# Patient Record
Sex: Female | Born: 1935 | Race: White | Hispanic: No | Smoking: Never smoker
Health system: Southern US, Community
[De-identification: ages and names within clinical notes are randomized; demographics above are authoritative.]

## PROBLEM LIST (undated history)

## (undated) DIAGNOSIS — E1149 Type 2 diabetes mellitus with other diabetic neurological complication: Secondary | ICD-10-CM

## (undated) DIAGNOSIS — K5730 Diverticulosis of large intestine without perforation or abscess without bleeding: Secondary | ICD-10-CM

## (undated) DIAGNOSIS — I4891 Unspecified atrial fibrillation: Secondary | ICD-10-CM

## (undated) DIAGNOSIS — I509 Heart failure, unspecified: Secondary | ICD-10-CM

## (undated) DIAGNOSIS — I10 Essential (primary) hypertension: Secondary | ICD-10-CM

## (undated) DIAGNOSIS — E119 Type 2 diabetes mellitus without complications: Secondary | ICD-10-CM

## (undated) DIAGNOSIS — G250 Essential tremor: Secondary | ICD-10-CM

## (undated) DIAGNOSIS — E785 Hyperlipidemia, unspecified: Secondary | ICD-10-CM

## (undated) DIAGNOSIS — E039 Hypothyroidism, unspecified: Secondary | ICD-10-CM

## (undated) DIAGNOSIS — F419 Anxiety disorder, unspecified: Secondary | ICD-10-CM

## (undated) HISTORY — DX: Diverticulosis of large intestine without perforation or abscess without bleeding: K57.30

## (undated) HISTORY — DX: Essential tremor: G25.0

## (undated) HISTORY — PX: PACEMAKER PLACEMENT: SHX43

## (undated) HISTORY — DX: Essential (primary) hypertension: I10

## (undated) HISTORY — DX: Type 2 diabetes mellitus without complications: E11.9

## (undated) HISTORY — DX: Heart failure, unspecified: I50.9

## (undated) HISTORY — DX: Unspecified atrial fibrillation: I48.91

## (undated) HISTORY — DX: Hyperlipidemia, unspecified: E78.5

## (undated) HISTORY — DX: Anxiety disorder, unspecified: F41.9

## (undated) HISTORY — PX: OTHER SURGICAL HISTORY: SHX169

## (undated) HISTORY — PX: TRANSTHORACIC ECHOCARDIOGRAM: SHX275

## (undated) HISTORY — DX: Hypothyroidism, unspecified: E03.9

## (undated) HISTORY — DX: Type 2 diabetes mellitus with other diabetic neurological complication: E11.49

---

## 1998-04-10 ENCOUNTER — Encounter: Payer: Self-pay | Admitting: Emergency Medicine

## 1998-04-10 ENCOUNTER — Emergency Department (HOSPITAL_COMMUNITY): Admission: EM | Admit: 1998-04-10 | Discharge: 1998-04-10 | Payer: Self-pay | Admitting: Emergency Medicine

## 1998-04-15 ENCOUNTER — Encounter: Payer: Self-pay | Admitting: Emergency Medicine

## 1998-04-15 ENCOUNTER — Emergency Department (HOSPITAL_COMMUNITY): Admission: EM | Admit: 1998-04-15 | Discharge: 1998-04-15 | Payer: Self-pay | Admitting: Emergency Medicine

## 1998-04-28 ENCOUNTER — Encounter: Admission: RE | Admit: 1998-04-28 | Discharge: 1998-07-27 | Payer: Self-pay | Admitting: Family Medicine

## 2000-05-22 HISTORY — PX: CATARACT EXTRACTION: SUR2

## 2000-08-03 ENCOUNTER — Emergency Department (HOSPITAL_COMMUNITY): Admission: EM | Admit: 2000-08-03 | Discharge: 2000-08-03 | Payer: Self-pay | Admitting: Emergency Medicine

## 2000-08-03 ENCOUNTER — Encounter: Payer: Self-pay | Admitting: Emergency Medicine

## 2000-12-25 ENCOUNTER — Encounter: Admission: RE | Admit: 2000-12-25 | Discharge: 2001-03-25 | Payer: Self-pay | Admitting: Internal Medicine

## 2002-01-02 ENCOUNTER — Encounter: Payer: Self-pay | Admitting: Emergency Medicine

## 2002-01-02 ENCOUNTER — Inpatient Hospital Stay (HOSPITAL_COMMUNITY): Admission: EM | Admit: 2002-01-02 | Discharge: 2002-01-05 | Payer: Self-pay | Admitting: Emergency Medicine

## 2002-03-16 ENCOUNTER — Inpatient Hospital Stay (HOSPITAL_COMMUNITY): Admission: EM | Admit: 2002-03-16 | Discharge: 2002-03-19 | Payer: Self-pay | Admitting: Emergency Medicine

## 2002-04-20 ENCOUNTER — Encounter: Payer: Self-pay | Admitting: Emergency Medicine

## 2002-04-20 ENCOUNTER — Inpatient Hospital Stay (HOSPITAL_COMMUNITY): Admission: EM | Admit: 2002-04-20 | Discharge: 2002-04-30 | Payer: Self-pay | Admitting: Emergency Medicine

## 2002-04-26 ENCOUNTER — Encounter: Payer: Self-pay | Admitting: *Deleted

## 2002-04-29 ENCOUNTER — Encounter: Payer: Self-pay | Admitting: *Deleted

## 2003-03-02 ENCOUNTER — Emergency Department (HOSPITAL_COMMUNITY): Admission: EM | Admit: 2003-03-02 | Discharge: 2003-03-02 | Payer: Self-pay | Admitting: Emergency Medicine

## 2003-03-02 ENCOUNTER — Encounter: Payer: Self-pay | Admitting: Emergency Medicine

## 2003-11-12 ENCOUNTER — Emergency Department (HOSPITAL_COMMUNITY): Admission: EM | Admit: 2003-11-12 | Discharge: 2003-11-12 | Payer: Self-pay | Admitting: *Deleted

## 2004-03-10 ENCOUNTER — Observation Stay (HOSPITAL_COMMUNITY): Admission: EM | Admit: 2004-03-10 | Discharge: 2004-03-11 | Payer: Self-pay | Admitting: Emergency Medicine

## 2004-03-30 ENCOUNTER — Ambulatory Visit: Payer: Self-pay | Admitting: Internal Medicine

## 2004-04-05 ENCOUNTER — Encounter: Admission: RE | Admit: 2004-04-05 | Discharge: 2004-04-05 | Payer: Self-pay | Admitting: Internal Medicine

## 2004-04-05 ENCOUNTER — Ambulatory Visit: Payer: Self-pay | Admitting: Internal Medicine

## 2004-04-11 ENCOUNTER — Ambulatory Visit: Payer: Self-pay | Admitting: Internal Medicine

## 2004-04-28 ENCOUNTER — Inpatient Hospital Stay (HOSPITAL_COMMUNITY): Admission: AD | Admit: 2004-04-28 | Discharge: 2004-05-03 | Payer: Self-pay | Admitting: Cardiology

## 2004-05-10 ENCOUNTER — Emergency Department (HOSPITAL_COMMUNITY): Admission: EM | Admit: 2004-05-10 | Discharge: 2004-05-10 | Payer: Self-pay | Admitting: Emergency Medicine

## 2004-06-07 ENCOUNTER — Ambulatory Visit: Payer: Self-pay | Admitting: Internal Medicine

## 2004-07-06 ENCOUNTER — Ambulatory Visit: Payer: Self-pay | Admitting: Internal Medicine

## 2004-07-18 ENCOUNTER — Ambulatory Visit: Payer: Self-pay | Admitting: Internal Medicine

## 2004-07-21 ENCOUNTER — Ambulatory Visit: Payer: Self-pay | Admitting: Internal Medicine

## 2004-07-26 ENCOUNTER — Ambulatory Visit: Payer: Self-pay | Admitting: Internal Medicine

## 2004-07-26 ENCOUNTER — Inpatient Hospital Stay (HOSPITAL_COMMUNITY): Admission: AD | Admit: 2004-07-26 | Discharge: 2004-07-30 | Payer: Self-pay | Admitting: Internal Medicine

## 2004-08-02 ENCOUNTER — Ambulatory Visit: Payer: Self-pay | Admitting: Internal Medicine

## 2004-08-09 ENCOUNTER — Ambulatory Visit: Payer: Self-pay | Admitting: Internal Medicine

## 2004-08-10 ENCOUNTER — Ambulatory Visit: Payer: Self-pay | Admitting: Internal Medicine

## 2004-08-16 ENCOUNTER — Ambulatory Visit: Payer: Self-pay | Admitting: Cardiology

## 2004-08-16 ENCOUNTER — Ambulatory Visit: Payer: Self-pay | Admitting: Internal Medicine

## 2004-08-26 ENCOUNTER — Ambulatory Visit: Payer: Self-pay | Admitting: Internal Medicine

## 2004-09-09 ENCOUNTER — Ambulatory Visit: Payer: Self-pay | Admitting: Cardiovascular Disease

## 2004-09-21 ENCOUNTER — Ambulatory Visit: Payer: Self-pay | Admitting: Internal Medicine

## 2004-09-23 ENCOUNTER — Ambulatory Visit: Payer: Self-pay | Admitting: Cardiovascular Disease

## 2004-10-14 ENCOUNTER — Ambulatory Visit: Payer: Self-pay | Admitting: Cardiology

## 2004-10-19 ENCOUNTER — Ambulatory Visit: Payer: Self-pay | Admitting: Internal Medicine

## 2004-10-24 ENCOUNTER — Ambulatory Visit: Payer: Self-pay | Admitting: Cardiology

## 2004-10-26 ENCOUNTER — Ambulatory Visit: Payer: Self-pay | Admitting: Internal Medicine

## 2004-11-14 ENCOUNTER — Ambulatory Visit: Payer: Self-pay | Admitting: Internal Medicine

## 2004-11-23 ENCOUNTER — Ambulatory Visit: Payer: Self-pay | Admitting: Family Medicine

## 2004-11-28 ENCOUNTER — Ambulatory Visit: Payer: Self-pay | Admitting: Cardiology

## 2004-12-09 ENCOUNTER — Ambulatory Visit: Payer: Self-pay | Admitting: Internal Medicine

## 2004-12-12 ENCOUNTER — Ambulatory Visit: Payer: Self-pay | Admitting: Cardiology

## 2004-12-23 ENCOUNTER — Ambulatory Visit: Payer: Self-pay | Admitting: Internal Medicine

## 2004-12-26 ENCOUNTER — Ambulatory Visit: Payer: Self-pay | Admitting: Cardiology

## 2004-12-29 ENCOUNTER — Ambulatory Visit: Payer: Self-pay | Admitting: Internal Medicine

## 2005-01-03 ENCOUNTER — Ambulatory Visit: Payer: Self-pay | Admitting: Internal Medicine

## 2005-01-11 ENCOUNTER — Ambulatory Visit: Payer: Self-pay | Admitting: Internal Medicine

## 2005-01-13 ENCOUNTER — Ambulatory Visit: Payer: Self-pay | Admitting: Internal Medicine

## 2005-01-16 ENCOUNTER — Ambulatory Visit: Payer: Self-pay | Admitting: Internal Medicine

## 2005-01-31 ENCOUNTER — Ambulatory Visit: Payer: Self-pay | Admitting: Internal Medicine

## 2005-02-15 ENCOUNTER — Ambulatory Visit: Payer: Self-pay | Admitting: Internal Medicine

## 2005-02-15 ENCOUNTER — Ambulatory Visit: Payer: Self-pay | Admitting: Cardiology

## 2005-02-27 ENCOUNTER — Ambulatory Visit: Payer: Self-pay | Admitting: Internal Medicine

## 2005-03-14 ENCOUNTER — Ambulatory Visit: Payer: Self-pay | Admitting: Internal Medicine

## 2005-03-21 ENCOUNTER — Encounter: Admission: RE | Admit: 2005-03-21 | Discharge: 2005-03-21 | Payer: Self-pay | Admitting: Internal Medicine

## 2005-03-21 ENCOUNTER — Ambulatory Visit: Payer: Self-pay | Admitting: Internal Medicine

## 2005-04-07 ENCOUNTER — Ambulatory Visit: Payer: Self-pay | Admitting: Internal Medicine

## 2005-04-11 ENCOUNTER — Ambulatory Visit: Payer: Self-pay | Admitting: Cardiology

## 2005-04-25 ENCOUNTER — Ambulatory Visit: Payer: Self-pay | Admitting: Cardiology

## 2005-05-04 ENCOUNTER — Ambulatory Visit: Payer: Self-pay | Admitting: Internal Medicine

## 2005-05-09 ENCOUNTER — Ambulatory Visit: Payer: Self-pay | Admitting: Cardiovascular Disease

## 2005-05-30 ENCOUNTER — Ambulatory Visit: Payer: Self-pay | Admitting: Cardiology

## 2005-06-09 ENCOUNTER — Ambulatory Visit: Payer: Self-pay | Admitting: Cardiology

## 2005-06-27 ENCOUNTER — Ambulatory Visit: Payer: Self-pay | Admitting: Cardiology

## 2005-07-04 ENCOUNTER — Ambulatory Visit: Payer: Self-pay | Admitting: Internal Medicine

## 2005-07-18 ENCOUNTER — Ambulatory Visit: Payer: Self-pay | Admitting: *Deleted

## 2005-08-01 ENCOUNTER — Ambulatory Visit: Payer: Self-pay | Admitting: *Deleted

## 2005-08-07 ENCOUNTER — Ambulatory Visit: Payer: Self-pay | Admitting: Internal Medicine

## 2005-08-16 ENCOUNTER — Ambulatory Visit: Payer: Self-pay | Admitting: *Deleted

## 2005-08-18 ENCOUNTER — Ambulatory Visit: Payer: Self-pay | Admitting: Internal Medicine

## 2005-09-13 ENCOUNTER — Ambulatory Visit: Payer: Self-pay | Admitting: *Deleted

## 2005-09-27 ENCOUNTER — Ambulatory Visit: Payer: Self-pay | Admitting: Cardiology

## 2005-10-11 ENCOUNTER — Ambulatory Visit: Payer: Self-pay | Admitting: Cardiology

## 2005-10-17 ENCOUNTER — Ambulatory Visit: Payer: Self-pay | Admitting: Internal Medicine

## 2005-10-20 ENCOUNTER — Emergency Department (HOSPITAL_COMMUNITY): Admission: EM | Admit: 2005-10-20 | Discharge: 2005-10-20 | Payer: Self-pay | Admitting: Emergency Medicine

## 2005-11-01 ENCOUNTER — Ambulatory Visit: Payer: Self-pay | Admitting: Cardiovascular Disease

## 2005-11-10 ENCOUNTER — Ambulatory Visit: Payer: Self-pay | Admitting: Cardiovascular Disease

## 2005-11-10 ENCOUNTER — Ambulatory Visit: Payer: Self-pay | Admitting: Internal Medicine

## 2005-11-17 ENCOUNTER — Ambulatory Visit: Payer: Self-pay | Admitting: Internal Medicine

## 2005-12-18 ENCOUNTER — Ambulatory Visit: Payer: Self-pay | Admitting: Family Medicine

## 2006-01-19 ENCOUNTER — Ambulatory Visit: Payer: Self-pay | Admitting: Internal Medicine

## 2006-02-07 ENCOUNTER — Ambulatory Visit: Payer: Self-pay | Admitting: Internal Medicine

## 2006-02-14 ENCOUNTER — Ambulatory Visit: Payer: Self-pay | Admitting: Internal Medicine

## 2006-03-13 ENCOUNTER — Ambulatory Visit: Payer: Self-pay | Admitting: Gastroenterology

## 2006-03-21 ENCOUNTER — Ambulatory Visit: Payer: Self-pay | Admitting: Gastroenterology

## 2006-03-21 LAB — HM COLONOSCOPY

## 2006-04-04 ENCOUNTER — Ambulatory Visit: Payer: Self-pay | Admitting: Internal Medicine

## 2006-04-04 LAB — CONVERTED CEMR LAB
ALT: 22 units/L (ref 0–40)
AST: 20 units/L (ref 0–37)
Albumin: 4 g/dL (ref 3.5–5.2)
Alkaline Phosphatase: 69 units/L (ref 39–117)
BUN: 23 mg/dL (ref 6–23)
Basophils Absolute: 0 10*3/uL (ref 0.0–0.1)
Basophils Relative: 0.6 % (ref 0.0–1.0)
Bilirubin Urine: NEGATIVE
CO2: 27 meq/L (ref 19–32)
Calcium: 9.8 mg/dL (ref 8.4–10.5)
Chloride: 97 meq/L (ref 96–112)
Chol/HDL Ratio, serum: 6.9
Cholesterol: 165 mg/dL (ref 0–200)
Creatinine, Ser: 1.2 mg/dL (ref 0.4–1.2)
Creatinine,U: 87.6 mg/dL
Crystals: NEGATIVE
Eosinophil percent: 1.3 % (ref 0.0–5.0)
GFR calc non Af Amer: 46 mL/min
Glomerular Filtration Rate, Af Am: 56 mL/min/{1.73_m2}
Glucose, Bld: 203 mg/dL — ABNORMAL HIGH (ref 70–99)
HCT: 38 % (ref 36.0–46.0)
HDL: 23.9 mg/dL — ABNORMAL LOW (ref >39.0)
Hemoglobin, Urine: NEGATIVE
Hemoglobin: 12.8 g/dL (ref 12.0–15.0)
Hgb A1c MFr Bld: 7.3 % — ABNORMAL HIGH (ref 4.6–6.0)
INR: 3 — ABNORMAL HIGH (ref 0.9–2.0)
Ketones, ur: NEGATIVE mg/dL
LDL DIRECT: 89.4 mg/dL
Lymphocytes Relative: 29.7 % (ref 12.0–46.0)
MCHC: 33.6 g/dL (ref 30.0–36.0)
MCV: 95.1 fL (ref 78.0–100.0)
Microalb Creat Ratio: 10.3 mg/g (ref 0.0–30.0)
Microalb, Ur: 0.9 mg/dL (ref 0.0–1.9)
Monocytes Absolute: 0.5 10*3/uL (ref 0.2–0.7)
Monocytes Relative: 7.4 % (ref 3.0–11.0)
Mucus, UA: NEGATIVE
Neutro Abs: 4 10*3/uL (ref 1.4–7.7)
Neutrophils Relative %: 61 % (ref 43.0–77.0)
Nitrite: NEGATIVE
Platelets: 143 10*3/uL — ABNORMAL LOW (ref 150–400)
Potassium: 4.2 meq/L (ref 3.5–5.1)
Prothrombin Time: 22.1 s — ABNORMAL HIGH (ref 10.0–14.0)
RBC / HPF: NONE SEEN
RBC: 3.99 M/uL (ref 3.87–5.11)
RDW: 12.7 % (ref 11.5–14.6)
Sodium: 131 meq/L — ABNORMAL LOW (ref 135–145)
Specific Gravity, Urine: 1.015 (ref 1.000–1.03)
TSH: 0.45 microintl units/mL (ref 0.35–5.50)
Total Bilirubin: 0.7 mg/dL (ref 0.3–1.2)
Total Protein, Urine: NEGATIVE mg/dL
Total Protein: 7.3 g/dL (ref 6.0–8.3)
Triglyceride fasting, serum: 306 mg/dL (ref 0–149)
Urine Glucose: NEGATIVE mg/dL
Urobilinogen, UA: 0.2 (ref 0.0–1.0)
VLDL: 61 mg/dL — ABNORMAL HIGH (ref 0–40)
WBC: 6.6 10*3/uL (ref 4.5–10.5)
pH: 5.5 (ref 5.0–8.0)

## 2006-04-16 ENCOUNTER — Ambulatory Visit: Payer: Self-pay | Admitting: Cardiology

## 2006-04-20 ENCOUNTER — Ambulatory Visit: Payer: Self-pay | Admitting: Gastroenterology

## 2006-04-23 ENCOUNTER — Ambulatory Visit: Payer: Self-pay | Admitting: Internal Medicine

## 2006-05-03 ENCOUNTER — Ambulatory Visit: Payer: Self-pay | Admitting: Cardiology

## 2006-05-30 ENCOUNTER — Ambulatory Visit: Payer: Self-pay | Admitting: Cardiology

## 2006-06-19 ENCOUNTER — Ambulatory Visit (HOSPITAL_COMMUNITY): Admission: RE | Admit: 2006-06-19 | Discharge: 2006-06-19 | Payer: Self-pay | Admitting: Internal Medicine

## 2006-06-19 ENCOUNTER — Ambulatory Visit: Payer: Self-pay | Admitting: Internal Medicine

## 2006-06-19 LAB — CONVERTED CEMR LAB: Prothrombin Time: 19.3 s — ABNORMAL HIGH (ref 10.0–14.0)

## 2006-06-22 ENCOUNTER — Ambulatory Visit: Payer: Self-pay | Admitting: Internal Medicine

## 2006-06-27 ENCOUNTER — Ambulatory Visit: Payer: Self-pay | Admitting: Cardiology

## 2006-08-01 ENCOUNTER — Ambulatory Visit: Payer: Self-pay | Admitting: *Deleted

## 2006-08-01 ENCOUNTER — Ambulatory Visit: Payer: Self-pay | Admitting: Internal Medicine

## 2006-08-22 ENCOUNTER — Ambulatory Visit: Payer: Self-pay | Admitting: Cardiovascular Disease

## 2006-09-19 ENCOUNTER — Ambulatory Visit: Payer: Self-pay | Admitting: Internal Medicine

## 2006-10-07 ENCOUNTER — Ambulatory Visit: Payer: Self-pay | Admitting: Internal Medicine

## 2006-10-07 ENCOUNTER — Inpatient Hospital Stay (HOSPITAL_COMMUNITY): Admission: EM | Admit: 2006-10-07 | Discharge: 2006-10-11 | Payer: Self-pay | Admitting: Emergency Medicine

## 2006-10-14 ENCOUNTER — Emergency Department (HOSPITAL_COMMUNITY): Admission: EM | Admit: 2006-10-14 | Discharge: 2006-10-14 | Payer: Self-pay | Admitting: Emergency Medicine

## 2006-10-16 ENCOUNTER — Ambulatory Visit: Payer: Self-pay | Admitting: Internal Medicine

## 2006-10-16 LAB — CONVERTED CEMR LAB
ALT: 21 units/L (ref 0–40)
AST: 18 units/L (ref 0–37)
Albumin: 3.8 g/dL (ref 3.5–5.2)
Alkaline Phosphatase: 69 units/L (ref 39–117)
BUN: 19 mg/dL (ref 6–23)
Bilirubin, Direct: 0.1 mg/dL (ref 0.0–0.3)
CO2: 27 meq/L (ref 19–32)
Calcium: 9.3 mg/dL (ref 8.4–10.5)
Chloride: 103 meq/L (ref 96–112)
Cholesterol: 173 mg/dL (ref 0–200)
Creatinine, Ser: 1.1 mg/dL (ref 0.4–1.2)
Direct LDL: 77 mg/dL
GFR calc Af Amer: 62 mL/min
GFR calc non Af Amer: 51 mL/min
Glucose, Bld: 85 mg/dL (ref 70–99)
HDL: 20.1 mg/dL — ABNORMAL LOW (ref >39.0)
Hgb A1c MFr Bld: 7.7 % — ABNORMAL HIGH (ref 4.6–6.0)
Potassium: 3.9 meq/L (ref 3.5–5.1)
Sodium: 134 meq/L — ABNORMAL LOW (ref 135–145)
Total Bilirubin: 0.7 mg/dL (ref 0.3–1.2)
Total CHOL/HDL Ratio: 8.6
Total Protein: 7.1 g/dL (ref 6.0–8.3)
Triglycerides: 431 mg/dL (ref 0–149)
VLDL: 86 mg/dL — ABNORMAL HIGH (ref 0–40)

## 2006-10-23 ENCOUNTER — Ambulatory Visit: Payer: Self-pay | Admitting: Internal Medicine

## 2006-11-16 ENCOUNTER — Ambulatory Visit: Payer: Self-pay | Admitting: Internal Medicine

## 2006-12-28 ENCOUNTER — Telehealth: Payer: Self-pay | Admitting: Internal Medicine

## 2007-01-02 DIAGNOSIS — I4891 Unspecified atrial fibrillation: Secondary | ICD-10-CM

## 2007-01-02 DIAGNOSIS — K5730 Diverticulosis of large intestine without perforation or abscess without bleeding: Secondary | ICD-10-CM | POA: Insufficient documentation

## 2007-01-02 DIAGNOSIS — E1140 Type 2 diabetes mellitus with diabetic neuropathy, unspecified: Secondary | ICD-10-CM

## 2007-01-02 DIAGNOSIS — I10 Essential (primary) hypertension: Secondary | ICD-10-CM

## 2007-01-02 DIAGNOSIS — E039 Hypothyroidism, unspecified: Secondary | ICD-10-CM

## 2007-01-02 DIAGNOSIS — F411 Generalized anxiety disorder: Secondary | ICD-10-CM

## 2007-01-24 ENCOUNTER — Telehealth: Payer: Self-pay | Admitting: Internal Medicine

## 2007-01-26 ENCOUNTER — Encounter: Payer: Self-pay | Admitting: Internal Medicine

## 2007-01-26 DIAGNOSIS — I5042 Chronic combined systolic (congestive) and diastolic (congestive) heart failure: Secondary | ICD-10-CM | POA: Insufficient documentation

## 2007-02-06 ENCOUNTER — Telehealth: Payer: Self-pay | Admitting: Internal Medicine

## 2007-02-11 ENCOUNTER — Ambulatory Visit: Payer: Self-pay

## 2007-02-13 ENCOUNTER — Ambulatory Visit: Payer: Self-pay | Admitting: Internal Medicine

## 2007-02-18 ENCOUNTER — Ambulatory Visit: Payer: Self-pay | Admitting: Internal Medicine

## 2007-02-18 LAB — CONVERTED CEMR LAB
ALT: 16 units/L (ref 0–35)
AST: 17 units/L (ref 0–37)
Albumin: 3.9 g/dL (ref 3.5–5.2)
Alkaline Phosphatase: 73 units/L (ref 39–117)
BUN: 20 mg/dL (ref 6–23)
Bilirubin, Direct: 0.1 mg/dL (ref 0.0–0.3)
CO2: 30 meq/L (ref 19–32)
Calcium: 9.2 mg/dL (ref 8.4–10.5)
Chloride: 102 meq/L (ref 96–112)
Cholesterol: 191 mg/dL (ref 0–200)
Creatinine, Ser: 1.1 mg/dL (ref 0.4–1.2)
Direct LDL: 127.8 mg/dL
GFR calc Af Amer: 62 mL/min
GFR calc non Af Amer: 51 mL/min
Glucose, Bld: 129 mg/dL — ABNORMAL HIGH (ref 70–99)
HDL: 23.1 mg/dL — ABNORMAL LOW (ref >39.0)
Potassium: 4.3 meq/L (ref 3.5–5.1)
Sodium: 137 meq/L (ref 135–145)
Total Bilirubin: 0.9 mg/dL (ref 0.3–1.2)
Total CHOL/HDL Ratio: 8.3
Total Protein: 6.5 g/dL (ref 6.0–8.3)
Triglycerides: 224 mg/dL (ref 0–149)
VLDL: 45 mg/dL — ABNORMAL HIGH (ref 0–40)

## 2007-02-26 ENCOUNTER — Ambulatory Visit: Payer: Self-pay | Admitting: Internal Medicine

## 2007-02-26 DIAGNOSIS — G252 Other specified forms of tremor: Secondary | ICD-10-CM | POA: Insufficient documentation

## 2007-02-26 DIAGNOSIS — G250 Essential tremor: Secondary | ICD-10-CM | POA: Insufficient documentation

## 2007-03-12 ENCOUNTER — Ambulatory Visit: Payer: Self-pay | Admitting: Internal Medicine

## 2007-04-24 ENCOUNTER — Telehealth: Payer: Self-pay | Admitting: Internal Medicine

## 2007-04-25 ENCOUNTER — Ambulatory Visit: Payer: Self-pay | Admitting: Internal Medicine

## 2007-04-25 LAB — CONVERTED CEMR LAB
ALT: 21 units/L (ref 0–35)
AST: 19 units/L (ref 0–37)
Albumin: 4.1 g/dL (ref 3.5–5.2)
Alkaline Phosphatase: 85 units/L (ref 39–117)
BUN: 20 mg/dL (ref 6–23)
Bilirubin, Direct: 0.1 mg/dL (ref 0.0–0.3)
CO2: 30 meq/L (ref 19–32)
Calcium: 9.3 mg/dL (ref 8.4–10.5)
Chloride: 102 meq/L (ref 96–112)
Cholesterol: 205 mg/dL (ref 0–200)
Creatinine, Ser: 1 mg/dL (ref 0.4–1.2)
Direct LDL: 140.6 mg/dL
GFR calc Af Amer: 69 mL/min
GFR calc non Af Amer: 57 mL/min
Glucose, Bld: 130 mg/dL — ABNORMAL HIGH (ref 70–99)
HDL: 28.5 mg/dL — ABNORMAL LOW (ref >39.0)
Hgb A1c MFr Bld: 6.3 % — ABNORMAL HIGH (ref 4.6–6.0)
Potassium: 4.3 meq/L (ref 3.5–5.1)
Sodium: 138 meq/L (ref 135–145)
Total Bilirubin: 0.6 mg/dL (ref 0.3–1.2)
Total CHOL/HDL Ratio: 7.2
Total Protein: 7.1 g/dL (ref 6.0–8.3)
Triglycerides: 176 mg/dL — ABNORMAL HIGH (ref 0–149)
VLDL: 35 mg/dL (ref 0–40)

## 2007-05-03 ENCOUNTER — Ambulatory Visit: Payer: Self-pay | Admitting: Internal Medicine

## 2007-05-03 DIAGNOSIS — E785 Hyperlipidemia, unspecified: Secondary | ICD-10-CM

## 2007-05-28 ENCOUNTER — Telehealth: Payer: Self-pay | Admitting: Internal Medicine

## 2007-06-19 ENCOUNTER — Ambulatory Visit: Payer: Self-pay | Admitting: Internal Medicine

## 2007-06-26 ENCOUNTER — Telehealth: Payer: Self-pay | Admitting: Internal Medicine

## 2007-07-01 ENCOUNTER — Emergency Department (HOSPITAL_COMMUNITY): Admission: EM | Admit: 2007-07-01 | Discharge: 2007-07-02 | Payer: Self-pay | Admitting: Emergency Medicine

## 2007-07-05 ENCOUNTER — Ambulatory Visit: Payer: Self-pay | Admitting: Internal Medicine

## 2007-07-05 DIAGNOSIS — M810 Age-related osteoporosis without current pathological fracture: Secondary | ICD-10-CM | POA: Insufficient documentation

## 2007-08-07 ENCOUNTER — Ambulatory Visit: Payer: Self-pay | Admitting: Internal Medicine

## 2007-08-09 ENCOUNTER — Ambulatory Visit: Payer: Self-pay | Admitting: Internal Medicine

## 2007-08-09 LAB — CONVERTED CEMR LAB
CO2: 24 meq/L (ref 19–32)
Creatinine, Ser: 1.3 mg/dL — ABNORMAL HIGH (ref 0.4–1.2)
GFR calc Af Amer: 51 mL/min
GFR calc non Af Amer: 42 mL/min
Hgb A1c MFr Bld: 6.7 % — ABNORMAL HIGH (ref 4.6–6.0)
Potassium: 4 meq/L (ref 3.5–5.1)
TSH: 1.06 microintl units/mL (ref 0.35–5.50)
Total CHOL/HDL Ratio: 6.8
Total Protein: 6.7 g/dL (ref 6.0–8.3)
VLDL: 39 mg/dL (ref 0–40)

## 2007-08-16 ENCOUNTER — Ambulatory Visit: Payer: Self-pay | Admitting: Internal Medicine

## 2007-08-19 ENCOUNTER — Telehealth: Payer: Self-pay | Admitting: Internal Medicine

## 2007-09-09 ENCOUNTER — Telehealth: Payer: Self-pay | Admitting: Internal Medicine

## 2007-09-11 ENCOUNTER — Ambulatory Visit: Payer: Self-pay | Admitting: Internal Medicine

## 2007-10-30 ENCOUNTER — Telehealth: Payer: Self-pay | Admitting: Internal Medicine

## 2007-12-09 ENCOUNTER — Ambulatory Visit: Payer: Self-pay | Admitting: Internal Medicine

## 2007-12-09 LAB — CONVERTED CEMR LAB
CO2: 27 meq/L (ref 19–32)
Calcium: 9.4 mg/dL (ref 8.4–10.5)
Cholesterol: 171 mg/dL (ref 0–200)
Direct LDL: 98.8 mg/dL
GFR calc Af Amer: 62 mL/min
GFR calc non Af Amer: 51 mL/min
Glucose, Bld: 184 mg/dL — ABNORMAL HIGH (ref 70–99)
HDL: 26.5 mg/dL — ABNORMAL LOW (ref >39.0)
Total Bilirubin: 0.7 mg/dL (ref 0.3–1.2)
Triglycerides: 299 mg/dL (ref 0–149)

## 2007-12-18 ENCOUNTER — Ambulatory Visit: Payer: Self-pay | Admitting: Internal Medicine

## 2008-01-29 ENCOUNTER — Ambulatory Visit: Payer: Self-pay | Admitting: Internal Medicine

## 2008-01-29 LAB — CONVERTED CEMR LAB
Bilirubin Urine: NEGATIVE
Glucose, Urine, Semiquant: NEGATIVE
Ketones, urine, test strip: NEGATIVE
Nitrite: NEGATIVE
Specific Gravity, Urine: 1.015

## 2008-02-11 ENCOUNTER — Ambulatory Visit: Payer: Self-pay | Admitting: Internal Medicine

## 2008-03-17 ENCOUNTER — Ambulatory Visit: Payer: Self-pay | Admitting: Internal Medicine

## 2008-03-19 LAB — CONVERTED CEMR LAB
ALT: 22 units/L (ref 0–35)
AST: 17 units/L (ref 0–37)
BUN: 23 mg/dL (ref 6–23)
CO2: 28 meq/L (ref 19–32)
Chloride: 105 meq/L (ref 96–112)
GFR calc non Af Amer: 51 mL/min
HDL: 24.4 mg/dL — ABNORMAL LOW (ref >39.0)
Hgb A1c MFr Bld: 7.3 % — ABNORMAL HIGH (ref 4.6–6.0)
Sodium: 139 meq/L (ref 135–145)
Triglycerides: 379 mg/dL (ref 0–149)
VLDL: 76 mg/dL — ABNORMAL HIGH (ref 0–40)

## 2008-03-24 ENCOUNTER — Telehealth: Payer: Self-pay | Admitting: *Deleted

## 2008-04-09 ENCOUNTER — Telehealth: Payer: Self-pay | Admitting: Internal Medicine

## 2008-04-14 ENCOUNTER — Telehealth: Payer: Self-pay | Admitting: Internal Medicine

## 2008-07-12 ENCOUNTER — Ambulatory Visit: Payer: Self-pay | Admitting: Cardiology

## 2008-07-12 ENCOUNTER — Inpatient Hospital Stay (HOSPITAL_COMMUNITY): Admission: EM | Admit: 2008-07-12 | Discharge: 2008-07-13 | Payer: Self-pay | Admitting: Emergency Medicine

## 2008-07-14 ENCOUNTER — Telehealth: Payer: Self-pay | Admitting: Internal Medicine

## 2008-08-05 ENCOUNTER — Ambulatory Visit: Payer: Self-pay | Admitting: Internal Medicine

## 2008-08-05 LAB — CONVERTED CEMR LAB
ALT: 17 units/L (ref 0–35)
BUN: 26 mg/dL — ABNORMAL HIGH (ref 6–23)
Bilirubin, Direct: 0 mg/dL (ref 0.0–0.3)
CO2: 30 meq/L (ref 19–32)
Chloride: 103 meq/L (ref 96–112)
Cholesterol: 220 mg/dL — ABNORMAL HIGH (ref 0–200)
Creatinine, Ser: 1.2 mg/dL (ref 0.4–1.2)
HDL: 27.7 mg/dL — ABNORMAL LOW (ref >39.00)
Hgb A1c MFr Bld: 7.1 % — ABNORMAL HIGH (ref 4.6–6.5)
Potassium: 4.3 meq/L (ref 3.5–5.1)
Sodium: 139 meq/L (ref 135–145)
Total Bilirubin: 0.7 mg/dL (ref 0.3–1.2)
VLDL: 80.4 mg/dL — ABNORMAL HIGH (ref 0.0–40.0)

## 2008-08-07 LAB — CONVERTED CEMR LAB: Vit D, 25-Hydroxy: 21 ng/mL — ABNORMAL LOW (ref 30–89)

## 2008-08-26 ENCOUNTER — Telehealth: Payer: Self-pay | Admitting: Internal Medicine

## 2008-08-28 ENCOUNTER — Encounter: Payer: Self-pay | Admitting: Internal Medicine

## 2008-10-02 ENCOUNTER — Encounter (INDEPENDENT_AMBULATORY_CARE_PROVIDER_SITE_OTHER): Payer: Self-pay | Admitting: *Deleted

## 2008-10-08 ENCOUNTER — Telehealth: Payer: Self-pay | Admitting: Internal Medicine

## 2008-10-09 ENCOUNTER — Encounter: Payer: Self-pay | Admitting: Internal Medicine

## 2008-10-13 ENCOUNTER — Ambulatory Visit: Payer: Self-pay | Admitting: Internal Medicine

## 2008-10-13 DIAGNOSIS — Z950 Presence of cardiac pacemaker: Secondary | ICD-10-CM

## 2008-12-13 ENCOUNTER — Emergency Department (HOSPITAL_COMMUNITY): Admission: EM | Admit: 2008-12-13 | Discharge: 2008-12-14 | Payer: Self-pay | Admitting: Emergency Medicine

## 2008-12-16 ENCOUNTER — Ambulatory Visit: Payer: Self-pay | Admitting: Family Medicine

## 2008-12-25 ENCOUNTER — Ambulatory Visit: Payer: Self-pay | Admitting: Family Medicine

## 2009-02-03 ENCOUNTER — Telehealth (INDEPENDENT_AMBULATORY_CARE_PROVIDER_SITE_OTHER): Payer: Self-pay | Admitting: *Deleted

## 2009-02-10 ENCOUNTER — Encounter: Payer: Self-pay | Admitting: Internal Medicine

## 2009-02-19 LAB — HM DIABETES EYE EXAM: HM Diabetic Eye Exam: NORMAL

## 2009-02-25 ENCOUNTER — Telehealth: Payer: Self-pay | Admitting: Internal Medicine

## 2009-03-11 ENCOUNTER — Ambulatory Visit: Payer: Self-pay | Admitting: Internal Medicine

## 2009-03-16 ENCOUNTER — Encounter: Payer: Self-pay | Admitting: Internal Medicine

## 2009-03-18 ENCOUNTER — Telehealth: Payer: Self-pay | Admitting: Internal Medicine

## 2009-03-18 LAB — CONVERTED CEMR LAB
GFR calc non Af Amer: 38.46 mL/min (ref >60)
Hgb A1c MFr Bld: 7.1 % — ABNORMAL HIGH (ref 4.6–6.5)
Sodium: 138 meq/L (ref 135–145)
VLDL: 70.4 mg/dL — ABNORMAL HIGH (ref 0.0–40.0)

## 2009-05-18 ENCOUNTER — Encounter: Payer: Self-pay | Admitting: Internal Medicine

## 2009-05-24 ENCOUNTER — Telehealth: Payer: Self-pay | Admitting: Internal Medicine

## 2009-06-25 ENCOUNTER — Encounter: Payer: Self-pay | Admitting: Internal Medicine

## 2009-07-02 ENCOUNTER — Ambulatory Visit: Payer: Self-pay | Admitting: Internal Medicine

## 2009-07-02 LAB — CONVERTED CEMR LAB
Bilirubin Urine: NEGATIVE
Ketones, urine, test strip: NEGATIVE
Protein, U semiquant: NEGATIVE
Specific Gravity, Urine: 1.01
Urobilinogen, UA: 0.2

## 2009-07-03 ENCOUNTER — Encounter: Payer: Self-pay | Admitting: Internal Medicine

## 2009-07-06 LAB — CONVERTED CEMR LAB
ALT: 16 units/L (ref 0–35)
Albumin: 4.1 g/dL (ref 3.5–5.2)
Alkaline Phosphatase: 79 units/L (ref 39–117)
Basophils Absolute: 0 10*3/uL (ref 0.0–0.1)
Bilirubin, Direct: 0.1 mg/dL (ref 0.0–0.3)
CO2: 27 meq/L (ref 19–32)
Chloride: 104 meq/L (ref 96–112)
GFR calc non Af Amer: 45.91 mL/min (ref >60)
Hgb A1c MFr Bld: 7.8 % — ABNORMAL HIGH (ref 4.6–6.5)
Lymphocytes Relative: 40.5 % (ref 12.0–46.0)
MCHC: 33.7 g/dL (ref 30.0–36.0)
MCV: 96.5 fL (ref 78.0–100.0)
Monocytes Absolute: 0.4 10*3/uL (ref 0.1–1.0)
Monocytes Relative: 7.2 % (ref 3.0–12.0)
Platelets: 100 10*3/uL — ABNORMAL LOW (ref 150.0–400.0)
Sodium: 136 meq/L (ref 135–145)
TSH: 0.69 microintl units/mL (ref 0.35–5.50)
Total Bilirubin: 0.6 mg/dL (ref 0.3–1.2)
Total CHOL/HDL Ratio: 6
Total Protein: 7.1 g/dL (ref 6.0–8.3)
Triglycerides: 437 mg/dL — ABNORMAL HIGH (ref 0.0–149.0)

## 2009-07-12 ENCOUNTER — Telehealth: Payer: Self-pay | Admitting: Internal Medicine

## 2009-07-21 ENCOUNTER — Telehealth: Payer: Self-pay | Admitting: Internal Medicine

## 2009-07-23 ENCOUNTER — Ambulatory Visit: Payer: Self-pay | Admitting: Internal Medicine

## 2009-07-23 ENCOUNTER — Telehealth: Payer: Self-pay | Admitting: Internal Medicine

## 2009-08-26 ENCOUNTER — Telehealth: Payer: Self-pay | Admitting: Internal Medicine

## 2009-09-06 ENCOUNTER — Ambulatory Visit: Payer: Self-pay | Admitting: Internal Medicine

## 2009-09-06 DIAGNOSIS — M549 Dorsalgia, unspecified: Secondary | ICD-10-CM | POA: Insufficient documentation

## 2009-09-28 ENCOUNTER — Ambulatory Visit: Payer: Self-pay | Admitting: Internal Medicine

## 2009-10-11 ENCOUNTER — Observation Stay (HOSPITAL_COMMUNITY)
Admission: EM | Admit: 2009-10-11 | Discharge: 2009-10-12 | Payer: Self-pay | Source: Home / Self Care | Admitting: Obstetrics and Gynecology

## 2009-10-11 ENCOUNTER — Telehealth: Payer: Self-pay | Admitting: Internal Medicine

## 2009-10-11 ENCOUNTER — Ambulatory Visit: Payer: Self-pay | Admitting: Cardiovascular Disease

## 2009-10-12 ENCOUNTER — Encounter (INDEPENDENT_AMBULATORY_CARE_PROVIDER_SITE_OTHER): Payer: Self-pay | Admitting: *Deleted

## 2009-10-12 ENCOUNTER — Encounter: Payer: Self-pay | Admitting: Cardiovascular Disease

## 2009-10-19 ENCOUNTER — Telehealth: Payer: Self-pay | Admitting: Internal Medicine

## 2009-11-01 ENCOUNTER — Ambulatory Visit: Payer: Self-pay | Admitting: Internal Medicine

## 2009-11-01 LAB — CONVERTED CEMR LAB
Albumin: 4.4 g/dL (ref 3.5–5.2)
Alkaline Phosphatase: 72 units/L (ref 39–117)
Bilirubin, Direct: 0.1 mg/dL (ref 0.0–0.3)
CO2: 28 meq/L (ref 19–32)
Direct LDL: 132.3 mg/dL
GFR calc non Af Amer: 37.17 mL/min (ref >60)
Glucose, Bld: 214 mg/dL — ABNORMAL HIGH (ref 70–99)
Glucose, Urine, Semiquant: NEGATIVE
Hgb A1c MFr Bld: 6.3 % (ref 4.6–6.5)
Ketones, urine, test strip: NEGATIVE
Nitrite: NEGATIVE
Protein, U semiquant: NEGATIVE
Sodium: 141 meq/L (ref 135–145)
Specific Gravity, Urine: 1.015
Total Bilirubin: 0.7 mg/dL (ref 0.3–1.2)
VLDL: 53.4 mg/dL — ABNORMAL HIGH (ref 0.0–40.0)
pH: 5.5

## 2009-11-03 ENCOUNTER — Ambulatory Visit: Payer: Self-pay | Admitting: Family Medicine

## 2009-11-03 LAB — CONVERTED CEMR LAB
Ketones, urine, test strip: NEGATIVE
Nitrite: NEGATIVE
Specific Gravity, Urine: 1.015
Urobilinogen, UA: 0.2

## 2009-11-04 LAB — CONVERTED CEMR LAB
Basophils Absolute: 0 10*3/uL (ref 0.0–0.1)
HCT: 34 % — ABNORMAL LOW (ref 36.0–46.0)
Lymphocytes Relative: 24.6 % (ref 12.0–46.0)
MCHC: 34.6 g/dL (ref 30.0–36.0)
Monocytes Relative: 7.2 % (ref 3.0–12.0)
Neutro Abs: 6 10*3/uL (ref 1.4–7.7)
Neutrophils Relative %: 67.2 % (ref 43.0–77.0)
Platelets: 105 10*3/uL — ABNORMAL LOW (ref 150.0–400.0)
RDW: 14.5 % (ref 11.5–14.6)

## 2009-11-08 ENCOUNTER — Ambulatory Visit: Payer: Self-pay | Admitting: Internal Medicine

## 2009-11-08 ENCOUNTER — Telehealth: Payer: Self-pay | Admitting: Internal Medicine

## 2009-11-08 DIAGNOSIS — H9190 Unspecified hearing loss, unspecified ear: Secondary | ICD-10-CM | POA: Insufficient documentation

## 2009-11-15 ENCOUNTER — Telehealth: Payer: Self-pay | Admitting: Internal Medicine

## 2009-11-18 ENCOUNTER — Encounter: Payer: Self-pay | Admitting: Internal Medicine

## 2009-11-23 ENCOUNTER — Ambulatory Visit: Payer: Self-pay | Admitting: Internal Medicine

## 2009-11-30 ENCOUNTER — Telehealth: Payer: Self-pay | Admitting: Internal Medicine

## 2009-12-06 ENCOUNTER — Telehealth: Payer: Self-pay | Admitting: Internal Medicine

## 2009-12-09 ENCOUNTER — Ambulatory Visit: Payer: Self-pay | Admitting: Gastroenterology

## 2009-12-09 LAB — CONVERTED CEMR LAB
Basophils Relative: 0.5 % (ref 0.0–3.0)
Eosinophils Absolute: 0.1 10*3/uL (ref 0.0–0.7)
Hemoglobin: 13.2 g/dL (ref 12.0–15.0)
Iron: 116 ug/dL (ref 42–145)
Monocytes Absolute: 0.5 10*3/uL (ref 0.1–1.0)
Monocytes Relative: 5.9 % (ref 3.0–12.0)
Neutro Abs: 5.2 10*3/uL (ref 1.4–7.7)
Neutrophils Relative %: 63 % (ref 43.0–77.0)
Saturation Ratios: 31.9 % (ref 20.0–50.0)
Vitamin B-12: 501 pg/mL (ref 211–911)

## 2009-12-13 ENCOUNTER — Telehealth: Payer: Self-pay | Admitting: Internal Medicine

## 2010-01-31 ENCOUNTER — Telehealth: Payer: Self-pay | Admitting: Internal Medicine

## 2010-02-15 ENCOUNTER — Ambulatory Visit: Payer: Self-pay | Admitting: Internal Medicine

## 2010-02-15 ENCOUNTER — Encounter: Payer: Self-pay | Admitting: Internal Medicine

## 2010-02-17 LAB — CONVERTED CEMR LAB
ALT: 17 units/L (ref 0–35)
AST: 18 units/L (ref 0–37)
Albumin: 4.5 g/dL (ref 3.5–5.2)
BUN: 33 mg/dL — ABNORMAL HIGH (ref 6–23)
CO2: 28 meq/L (ref 19–32)
Chloride: 104 meq/L (ref 96–112)
Direct LDL: 124.1 mg/dL
GFR calc non Af Amer: 34.63 mL/min (ref >60)
HDL: 32 mg/dL — ABNORMAL LOW (ref >39.00)
Hgb A1c MFr Bld: 6.5 % (ref 4.6–6.5)
Potassium: 4 meq/L (ref 3.5–5.1)
Sodium: 139 meq/L (ref 135–145)
TSH: 0.82 microintl units/mL (ref 0.35–5.50)
Total Bilirubin: 0.6 mg/dL (ref 0.3–1.2)
Total Protein: 7.1 g/dL (ref 6.0–8.3)
Triglycerides: 360 mg/dL — ABNORMAL HIGH (ref 0.0–149.0)

## 2010-02-23 ENCOUNTER — Encounter: Payer: Self-pay | Admitting: Internal Medicine

## 2010-02-24 ENCOUNTER — Encounter: Admission: RE | Admit: 2010-02-24 | Discharge: 2010-03-15 | Payer: Self-pay | Admitting: Internal Medicine

## 2010-03-02 ENCOUNTER — Encounter: Payer: Self-pay | Admitting: Internal Medicine

## 2010-03-03 ENCOUNTER — Telehealth: Payer: Self-pay | Admitting: Internal Medicine

## 2010-03-15 ENCOUNTER — Encounter: Payer: Self-pay | Admitting: Internal Medicine

## 2010-04-21 ENCOUNTER — Encounter: Payer: Self-pay | Admitting: Internal Medicine

## 2010-05-25 ENCOUNTER — Inpatient Hospital Stay (HOSPITAL_COMMUNITY)
Admission: EM | Admit: 2010-05-25 | Discharge: 2010-05-30 | Payer: Self-pay | Source: Home / Self Care | Attending: Internal Medicine | Admitting: Internal Medicine

## 2010-05-25 ENCOUNTER — Ambulatory Visit: Admit: 2010-05-25 | Payer: Self-pay

## 2010-05-25 LAB — COMPREHENSIVE METABOLIC PANEL
ALT: 13 U/L (ref 0–35)
AST: 16 U/L (ref 0–37)
Albumin: 3.4 g/dL — ABNORMAL LOW (ref 3.5–5.2)
Alkaline Phosphatase: 69 U/L (ref 39–117)
BUN: 26 mg/dL — ABNORMAL HIGH (ref 6–23)
CO2: 25 mEq/L (ref 19–32)
Calcium: 9.1 mg/dL (ref 8.4–10.5)
Chloride: 100 mEq/L (ref 96–112)
Creatinine, Ser: 1.41 mg/dL — ABNORMAL HIGH (ref 0.4–1.2)
GFR calc Af Amer: 43 mL/min — ABNORMAL LOW (ref >60)
GFR calc non Af Amer: 36 mL/min — ABNORMAL LOW (ref >60)
Glucose, Bld: 253 mg/dL — ABNORMAL HIGH (ref 70–99)
Potassium: 3.6 mEq/L (ref 3.5–5.1)
Sodium: 136 mEq/L (ref 135–145)
Total Bilirubin: 0.6 mg/dL (ref 0.3–1.2)
Total Protein: 6.6 g/dL (ref 6.0–8.3)

## 2010-05-25 LAB — PROTIME-INR
INR: 1.03 (ref 0.00–1.49)
Prothrombin Time: 13.7 seconds (ref 11.6–15.2)

## 2010-05-25 LAB — CBC
HCT: 36 % (ref 36.0–46.0)
Hemoglobin: 11.8 g/dL — ABNORMAL LOW (ref 12.0–15.0)
MCH: 31.1 pg (ref 26.0–34.0)
MCHC: 32.8 g/dL (ref 30.0–36.0)
MCV: 94.7 fL (ref 78.0–100.0)
Platelets: 78 10*3/uL — ABNORMAL LOW (ref 150–400)
RBC: 3.8 MIL/uL — ABNORMAL LOW (ref 3.87–5.11)
RDW: 13.6 % (ref 11.5–15.5)
WBC: 6.9 10*3/uL (ref 4.0–10.5)

## 2010-05-25 LAB — CARDIAC PANEL(CRET KIN+CKTOT+MB+TROPI)
CK, MB: 0.7 ng/mL (ref 0.3–4.0)
Relative Index: INVALID (ref 0.0–2.5)
Total CK: 26 U/L (ref 7–177)
Troponin I: 0.02 ng/mL (ref 0.00–0.06)

## 2010-05-25 LAB — DIFFERENTIAL
Basophils Absolute: 0 10*3/uL (ref 0.0–0.1)
Basophils Relative: 0 % (ref 0–1)
Eosinophils Absolute: 0.1 10*3/uL (ref 0.0–0.7)
Eosinophils Relative: 1 % (ref 0–5)
Lymphocytes Relative: 16 % (ref 12–46)
Lymphs Abs: 1.1 10*3/uL (ref 0.7–4.0)
Monocytes Absolute: 0.4 10*3/uL (ref 0.1–1.0)
Monocytes Relative: 6 % (ref 3–12)
Neutro Abs: 5.3 10*3/uL (ref 1.7–7.7)
Neutrophils Relative %: 76 % (ref 43–77)

## 2010-05-25 LAB — TROPONIN I: Troponin I: 0.03 ng/mL (ref 0.00–0.06)

## 2010-05-25 LAB — CK TOTAL AND CKMB (NOT AT ARMC)
CK, MB: 0.7 ng/mL (ref 0.3–4.0)
Relative Index: INVALID (ref 0.0–2.5)
Total CK: 27 U/L (ref 7–177)

## 2010-05-25 LAB — GLUCOSE, CAPILLARY
Glucose-Capillary: 187 mg/dL — ABNORMAL HIGH (ref 70–99)
Glucose-Capillary: 95 mg/dL (ref 70–99)

## 2010-05-25 LAB — MRSA PCR SCREENING: MRSA by PCR: NEGATIVE

## 2010-05-25 LAB — POCT CARDIAC MARKERS
CKMB, poc: <1 ng/mL — ABNORMAL LOW (ref 1.0–8.0)
Myoglobin, poc: 115 ng/mL (ref 12–200)
Troponin i, poc: <0.05 ng/mL (ref 0.00–0.09)

## 2010-05-25 LAB — TSH: TSH: 2.038 u[IU]/mL (ref 0.350–4.500)

## 2010-05-25 LAB — LIPASE, BLOOD: Lipase: 24 U/L (ref 11–59)

## 2010-05-25 LAB — APTT: aPTT: 32 seconds (ref 24–37)

## 2010-05-26 ENCOUNTER — Other Ambulatory Visit: Payer: Self-pay | Admitting: Internal Medicine

## 2010-05-26 LAB — COMPREHENSIVE METABOLIC PANEL
ALT: 11 U/L (ref 0–35)
AST: 13 U/L (ref 0–37)
Albumin: 3 g/dL — ABNORMAL LOW (ref 3.5–5.2)
Alkaline Phosphatase: 58 U/L (ref 39–117)
BUN: 20 mg/dL (ref 6–23)
CO2: 24 mEq/L (ref 19–32)
Calcium: 8.2 mg/dL — ABNORMAL LOW (ref 8.4–10.5)
Chloride: 107 mEq/L (ref 96–112)
Creatinine, Ser: 1.3 mg/dL — ABNORMAL HIGH (ref 0.4–1.2)
GFR calc Af Amer: 48 mL/min — ABNORMAL LOW (ref >60)
GFR calc non Af Amer: 39 mL/min — ABNORMAL LOW (ref >60)
Glucose, Bld: 90 mg/dL (ref 70–99)
Potassium: 3.8 mEq/L (ref 3.5–5.1)
Sodium: 137 mEq/L (ref 135–145)
Total Bilirubin: 0.6 mg/dL (ref 0.3–1.2)
Total Protein: 5.7 g/dL — ABNORMAL LOW (ref 6.0–8.3)

## 2010-05-26 LAB — CARDIAC PANEL(CRET KIN+CKTOT+MB+TROPI)
CK, MB: 1.1 ng/mL (ref 0.3–4.0)
Relative Index: INVALID (ref 0.0–2.5)
Total CK: 21 U/L (ref 7–177)
Troponin I: 0.04 ng/mL (ref 0.00–0.06)

## 2010-05-26 LAB — URINALYSIS, ROUTINE W REFLEX MICROSCOPIC
Bilirubin Urine: NEGATIVE
Hemoglobin, Urine: NEGATIVE
Ketones, ur: NEGATIVE mg/dL
Nitrite: POSITIVE — AB
Protein, ur: NEGATIVE mg/dL
Specific Gravity, Urine: 1.012 (ref 1.005–1.030)
Urine Glucose, Fasting: NEGATIVE mg/dL
Urobilinogen, UA: 0.2 mg/dL (ref 0.0–1.0)
pH: 6 (ref 5.0–8.0)

## 2010-05-26 LAB — HEMOGLOBIN A1C
Hgb A1c MFr Bld: 6.1 % — ABNORMAL HIGH (ref <5.7)
Mean Plasma Glucose: 128 mg/dL — ABNORMAL HIGH (ref <117)

## 2010-05-26 LAB — LIPID PANEL
Cholesterol: 153 mg/dL (ref 0–200)
HDL: 28 mg/dL — ABNORMAL LOW (ref >39)
LDL Cholesterol: 103 mg/dL — ABNORMAL HIGH (ref 0–99)
Total CHOL/HDL Ratio: 5.5 RATIO
Triglycerides: 112 mg/dL (ref <150)
VLDL: 22 mg/dL (ref 0–40)

## 2010-05-26 LAB — DIFFERENTIAL
Basophils Absolute: 0 10*3/uL (ref 0.0–0.1)
Basophils Relative: 0 % (ref 0–1)
Eosinophils Absolute: 0.1 10*3/uL (ref 0.0–0.7)
Eosinophils Relative: 1 % (ref 0–5)
Lymphocytes Relative: 30 % (ref 12–46)
Lymphs Abs: 2.2 10*3/uL (ref 0.7–4.0)
Monocytes Absolute: 0.6 10*3/uL (ref 0.1–1.0)
Monocytes Relative: 8 % (ref 3–12)
Neutro Abs: 4.4 10*3/uL (ref 1.7–7.7)
Neutrophils Relative %: 61 % (ref 43–77)

## 2010-05-26 LAB — GLUCOSE, CAPILLARY
Glucose-Capillary: 101 mg/dL — ABNORMAL HIGH (ref 70–99)
Glucose-Capillary: 116 mg/dL — ABNORMAL HIGH (ref 70–99)
Glucose-Capillary: 203 mg/dL — ABNORMAL HIGH (ref 70–99)
Glucose-Capillary: 210 mg/dL — ABNORMAL HIGH (ref 70–99)
Glucose-Capillary: 265 mg/dL — ABNORMAL HIGH (ref 70–99)
Glucose-Capillary: 83 mg/dL (ref 70–99)

## 2010-05-26 LAB — T4, FREE: Free T4: 0.85 ng/dL (ref 0.80–1.80)

## 2010-05-26 LAB — URINE CULTURE
Colony Count: 50000
Culture  Setup Time: 201201040124
Special Requests: NEGATIVE

## 2010-05-26 LAB — URINE MICROSCOPIC-ADD ON

## 2010-05-26 LAB — CBC
HCT: 31.9 % — ABNORMAL LOW (ref 36.0–46.0)
Hemoglobin: 10.5 g/dL — ABNORMAL LOW (ref 12.0–15.0)
MCH: 31.7 pg (ref 26.0–34.0)
MCHC: 32.9 g/dL (ref 30.0–36.0)
MCV: 96.4 fL (ref 78.0–100.0)
Platelets: 81 10*3/uL — ABNORMAL LOW (ref 150–400)
RBC: 3.31 MIL/uL — ABNORMAL LOW (ref 3.87–5.11)
RDW: 13.9 % (ref 11.5–15.5)
WBC: 7.3 10*3/uL (ref 4.0–10.5)

## 2010-05-26 LAB — MAGNESIUM: Magnesium: 1.8 mg/dL (ref 1.5–2.5)

## 2010-05-26 LAB — PHOSPHORUS: Phosphorus: 3.4 mg/dL (ref 2.3–4.6)

## 2010-05-26 LAB — CREATININE, URINE, RANDOM: Creatinine, Urine: 55.1 mg/dL

## 2010-05-26 LAB — SODIUM, URINE, RANDOM: Sodium, Ur: 97 mEq/L

## 2010-05-27 LAB — CBC
HCT: 29.2 % — ABNORMAL LOW (ref 36.0–46.0)
Hemoglobin: 9.5 g/dL — ABNORMAL LOW (ref 12.0–15.0)
MCH: 31 pg (ref 26.0–34.0)
MCHC: 32.5 g/dL (ref 30.0–36.0)
MCV: 95.4 fL (ref 78.0–100.0)
Platelets: 73 10*3/uL — ABNORMAL LOW (ref 150–400)
RBC: 3.06 MIL/uL — ABNORMAL LOW (ref 3.87–5.11)
RDW: 13.7 % (ref 11.5–15.5)
WBC: 6.1 10*3/uL (ref 4.0–10.5)

## 2010-05-27 LAB — BASIC METABOLIC PANEL
BUN: 18 mg/dL (ref 6–23)
CO2: 22 mEq/L (ref 19–32)
Calcium: 8.6 mg/dL (ref 8.4–10.5)
Chloride: 106 mEq/L (ref 96–112)
Creatinine, Ser: 1.4 mg/dL — ABNORMAL HIGH (ref 0.4–1.2)
GFR calc Af Amer: 44 mL/min — ABNORMAL LOW (ref >60)
GFR calc non Af Amer: 36 mL/min — ABNORMAL LOW (ref >60)
Glucose, Bld: 190 mg/dL — ABNORMAL HIGH (ref 70–99)
Potassium: 3.8 mEq/L (ref 3.5–5.1)
Sodium: 135 mEq/L (ref 135–145)

## 2010-05-27 LAB — GLUCOSE, CAPILLARY
Glucose-Capillary: 152 mg/dL — ABNORMAL HIGH (ref 70–99)
Glucose-Capillary: 228 mg/dL — ABNORMAL HIGH (ref 70–99)
Glucose-Capillary: 273 mg/dL — ABNORMAL HIGH (ref 70–99)

## 2010-05-30 ENCOUNTER — Encounter (INDEPENDENT_AMBULATORY_CARE_PROVIDER_SITE_OTHER): Payer: Self-pay | Admitting: *Deleted

## 2010-06-06 LAB — GLUCOSE, CAPILLARY
Glucose-Capillary: 168 mg/dL — ABNORMAL HIGH (ref 70–99)
Glucose-Capillary: 139 mg/dL — ABNORMAL HIGH (ref 70–99)
Glucose-Capillary: 124 mg/dL — ABNORMAL HIGH (ref 70–99)
Glucose-Capillary: 168 mg/dL — ABNORMAL HIGH (ref 70–99)
Glucose-Capillary: 100 mg/dL — ABNORMAL HIGH (ref 70–99)
Glucose-Capillary: 170 mg/dL — ABNORMAL HIGH (ref 70–99)
Glucose-Capillary: 138 mg/dL — ABNORMAL HIGH (ref 70–99)
Glucose-Capillary: 147 mg/dL — ABNORMAL HIGH (ref 70–99)
Glucose-Capillary: 107 mg/dL — ABNORMAL HIGH (ref 70–99)
Glucose-Capillary: 161 mg/dL — ABNORMAL HIGH (ref 70–99)
Glucose-Capillary: 183 mg/dL — ABNORMAL HIGH (ref 70–99)

## 2010-06-06 LAB — CBC
HCT: 27 % — ABNORMAL LOW (ref 36.0–46.0)
HCT: 29.8 % — ABNORMAL LOW (ref 36.0–46.0)
Hemoglobin: 9.2 g/dL — ABNORMAL LOW (ref 12.0–15.0)
Hemoglobin: 9.8 g/dL — ABNORMAL LOW (ref 12.0–15.0)
MCH: 31.9 pg (ref 26.0–34.0)
MCH: 30.9 pg (ref 26.0–34.0)
MCHC: 34.1 g/dL (ref 30.0–36.0)
MCHC: 32.9 g/dL (ref 30.0–36.0)
MCV: 93.8 fL (ref 78.0–100.0)
MCV: 94 fL (ref 78.0–100.0)
Platelets: 85 10*3/uL — ABNORMAL LOW (ref 150–400)
Platelets: 103 10*3/uL — ABNORMAL LOW (ref 150–400)
RBC: 2.88 MIL/uL — ABNORMAL LOW (ref 3.87–5.11)
RBC: 3.17 MIL/uL — ABNORMAL LOW (ref 3.87–5.11)
RDW: 13.6 % (ref 11.5–15.5)
RDW: 13.4 % (ref 11.5–15.5)
WBC: 5.4 10*3/uL (ref 4.0–10.5)
WBC: 5.7 10*3/uL (ref 4.0–10.5)

## 2010-06-06 LAB — CULTURE, BLOOD (ROUTINE X 2)
Culture  Setup Time: 201201050128
Culture  Setup Time: 201201050128
Culture: NO GROWTH
Culture: NO GROWTH

## 2010-06-06 LAB — BASIC METABOLIC PANEL
BUN: 17 mg/dL (ref 6–23)
BUN: 24 mg/dL — ABNORMAL HIGH (ref 6–23)
BUN: 21 mg/dL (ref 6–23)
CO2: 22 mEq/L (ref 19–32)
CO2: 22 mEq/L (ref 19–32)
CO2: 23 mEq/L (ref 19–32)
Calcium: 8.4 mg/dL (ref 8.4–10.5)
Calcium: 9.1 mg/dL (ref 8.4–10.5)
Calcium: 8.7 mg/dL (ref 8.4–10.5)
Chloride: 103 mEq/L (ref 96–112)
Chloride: 106 mEq/L (ref 96–112)
Chloride: 107 mEq/L (ref 96–112)
Creatinine, Ser: 1.26 mg/dL — ABNORMAL HIGH (ref 0.4–1.2)
Creatinine, Ser: 1.26 mg/dL — ABNORMAL HIGH (ref 0.4–1.2)
Creatinine, Ser: 1.23 mg/dL — ABNORMAL HIGH (ref 0.4–1.2)
GFR calc Af Amer: 49 mL/min — ABNORMAL LOW (ref >60)
GFR calc Af Amer: 49 mL/min — ABNORMAL LOW (ref >60)
GFR calc Af Amer: 51 mL/min — ABNORMAL LOW (ref >60)
GFR calc non Af Amer: 41 mL/min — ABNORMAL LOW (ref >60)
GFR calc non Af Amer: 41 mL/min — ABNORMAL LOW (ref >60)
GFR calc non Af Amer: 42 mL/min — ABNORMAL LOW (ref >60)
Glucose, Bld: 133 mg/dL — ABNORMAL HIGH (ref 70–99)
Glucose, Bld: 163 mg/dL — ABNORMAL HIGH (ref 70–99)
Glucose, Bld: 150 mg/dL — ABNORMAL HIGH (ref 70–99)
Potassium: 3.8 mEq/L (ref 3.5–5.1)
Potassium: 4.1 mEq/L (ref 3.5–5.1)
Potassium: 3.9 mEq/L (ref 3.5–5.1)
Sodium: 132 mEq/L — ABNORMAL LOW (ref 135–145)
Sodium: 137 mEq/L (ref 135–145)
Sodium: 138 mEq/L (ref 135–145)

## 2010-06-07 ENCOUNTER — Other Ambulatory Visit: Payer: Self-pay | Admitting: Internal Medicine

## 2010-06-07 ENCOUNTER — Ambulatory Visit
Admission: RE | Admit: 2010-06-07 | Discharge: 2010-06-07 | Payer: Self-pay | Source: Home / Self Care | Attending: Internal Medicine | Admitting: Internal Medicine

## 2010-06-07 LAB — BASIC METABOLIC PANEL
BUN: 28 mg/dL — ABNORMAL HIGH (ref 6–23)
CO2: 26 mEq/L (ref 19–32)
Calcium: 9.1 mg/dL (ref 8.4–10.5)
Chloride: 102 mEq/L (ref 96–112)
Creatinine, Ser: 1.6 mg/dL — ABNORMAL HIGH (ref 0.4–1.2)
GFR: 31.94 mL/min — ABNORMAL LOW (ref >60.00)
Glucose, Bld: 128 mg/dL — ABNORMAL HIGH (ref 70–99)
Potassium: 4 mEq/L (ref 3.5–5.1)
Sodium: 137 mEq/L (ref 135–145)

## 2010-06-07 LAB — HEMOGLOBIN A1C: Hgb A1c MFr Bld: 6.5 % (ref 4.6–6.5)

## 2010-06-07 LAB — TSH: TSH: 0.73 u[IU]/mL (ref 0.35–5.50)

## 2010-06-09 ENCOUNTER — Encounter: Payer: Self-pay | Admitting: Internal Medicine

## 2010-06-09 ENCOUNTER — Telehealth: Payer: Self-pay | Admitting: Internal Medicine

## 2010-06-09 ENCOUNTER — Ambulatory Visit
Admission: RE | Admit: 2010-06-09 | Discharge: 2010-06-09 | Payer: Self-pay | Source: Home / Self Care | Attending: Internal Medicine | Admitting: Internal Medicine

## 2010-06-17 ENCOUNTER — Other Ambulatory Visit: Payer: Self-pay | Admitting: Internal Medicine

## 2010-06-17 ENCOUNTER — Ambulatory Visit
Admission: RE | Admit: 2010-06-17 | Discharge: 2010-06-17 | Payer: Self-pay | Source: Home / Self Care | Attending: Internal Medicine | Admitting: Internal Medicine

## 2010-06-17 ENCOUNTER — Encounter: Payer: Self-pay | Admitting: Internal Medicine

## 2010-06-17 LAB — BASIC METABOLIC PANEL
BUN: 22 mg/dL (ref 6–23)
CO2: 26 mEq/L (ref 19–32)
Chloride: 103 mEq/L (ref 96–112)
Creatinine, Ser: 1.3 mg/dL — ABNORMAL HIGH (ref 0.4–1.2)
GFR: 40.67 mL/min — ABNORMAL LOW (ref >60.00)
Potassium: 4.9 mEq/L (ref 3.5–5.1)
Sodium: 137 mEq/L (ref 135–145)

## 2010-06-17 LAB — CBC WITH DIFFERENTIAL/PLATELET
Eosinophils Relative: 1.6 % (ref 0.0–5.0)
HCT: 35.9 % — ABNORMAL LOW (ref 36.0–46.0)
Hemoglobin: 12 g/dL (ref 12.0–15.0)
Lymphocytes Relative: 33.3 % (ref 12.0–46.0)
MCHC: 33.5 g/dL (ref 30.0–36.0)
Neutrophils Relative %: 56.6 % (ref 43.0–77.0)
WBC: 5.1 10*3/uL (ref 4.5–10.5)

## 2010-06-17 LAB — APTT: aPTT: 24.3 s (ref 21.7–28.8)

## 2010-06-17 LAB — PROTIME-INR: Prothrombin Time: 12.3 s (ref 10.2–12.4)

## 2010-06-21 NOTE — Assessment & Plan Note (Signed)
Summary: pc2  Medications Added METOPROLOL TARTRATE 50 MG TABS (METOPROLOL TARTRATE) Take 1 tablet by mouth every morning and 1/2 every evening VITAMIN D3 2000 UNIT CAPS (CHOLECALCIFEROL) qd      Allergies Added:   Visit Type:  Follow-up Primary Provider:  Birdie Sons MD   History of Present Illness: Ms. Gernert returns today for followup of her pacemaker and atrial fibrillation.  She has a h/o persistent atrial fibrillation but has done quite well in the past several years on Tikosyn.  She does not feel palpitations when she goes into atrial fibrillation as she has undergone AV node ablation in the past by Dr. Severiano Gilbert.  She has a resting tremor which has been stable and appears better today.  She denies peripheral edema or worsening CHF symptoms.  No other complaints today.  Current Medications (verified): 1)  Metoprolol Tartrate 50 Mg Tabs (Metoprolol Tartrate) .... Take 1 Tablet By Mouth Every Morning and 1/2 Every Evening 2)  Micardis 80 Mg Tabs (Telmisartan) .... Take 1 Tablet By Mouth Once A Day 3)  Onetouch Test  Strp (Glucose Blood) .... Test Daily As Directed 4)  Zoloft 50 Mg Tabs (Sertraline Hcl) .... One By Mouth Daily 5)  Tikosyn 250 Mcg  Caps (Dofetilide) .... Take 1 Capsule By Mouth Every 12 Hours 6)  Amitiza 24 Mcg  Caps (Lubiprostone) .... One By Mouth Bid 7)  Levothroid 75 Mcg Tabs (Levothyroxine Sodium) .... Take 1 Tablet By Mouth Once A Day 8)  Hyomax-Sr 0.375 Mg Xr12h-Tab (Hyoscyamine Sulfate) .... Take 1 Tablet By Mouth Two Times A Day 9)  Tylenol Extra Strength 500 Mg Tabs (Acetaminophen) .... As Needed 10)  Gas-X Extra Strength 125 Mg Caps (Simethicone) .... As Needed 11)  Onetouch Ultra System W/device Kit (Blood Glucose Monitoring Suppl) .... Use Once Daily As Directed 12)  Vitamin D3 2000 Unit Caps (Cholecalciferol) .... Qd  Allergies (verified): 1)  ! Codeine Sulfate (Codeine Sulfate) 2)  ! Simvastatin (Simvastatin)  Past History:  Past Medical  History: Last updated: 09/04/2008 chronic AC BACK PAIN (ICD-724.5) OSTEOPOROSIS (ICD-733.00) HYPERLIPIDEMIA (ICD-272.4) OTHER AND UNSPECIFIED HYPERLIPIDEMIA (ICD-272.4) TREMOR, ESSENTIAL (ICD-333.1) EAR PAIN, LEFT (ICD-388.70) CONGESTIVE HEART FAILURE (ICD-428.0) ANTICOAGULATION THERAPY (ICD-V58.61) DIABETIC PERIPHERAL NEUROPATHY (ICD-250.60) HX, PERSONAL, HEALTH HAZARDS NEC (ICD-V15.89) HYPOTHYROIDISM (ICD-244.9) HYPERTENSION (ICD-401.9) DIVERTICULOSIS, COLON (ICD-562.10) DIABETES MELLITUS, TYPE II (ICD-250.00) ATRIAL FIBRILLATION (ICD-427.31) ANXIETY (ICD-300.00)    Past Surgical History: Last updated: 01/02/2007 Cataract extraction  2002 PPM L3 compression fx  Review of Systems  The patient denies chest pain, syncope, dyspnea on exertion, and peripheral edema.    Vital Signs:  Patient profile:   77 year old female Height:      66 inches Weight:      172 pounds BMI:     27.86 Pulse rate:   74 / minute BP sitting:   102 / 80  (left arm)  Vitals Entered By: Laurance Flatten CMA (Sep 28, 2009 3:05 PM)  Physical Exam  General:  alert and well-developed.   Head:  normocephalic and atraumatic.   Eyes:  pupils equal and pupils round.   Mouth:  Teeth, gums and palate normal. Oral mucosa normal. Neck:  No deformities, masses, or tenderness noted. Chest Wall:  No deformities, masses, or tenderness noted. Well healed PPM incision. Lungs:  normal respiratory effort and no accessory muscle use.   Heart:  normal rate and regular rhythm.   Abdomen:  soft and non-tender.   Msk:  no localized reproducible back tenderness. Pulses:  pulses normal  in all 4 extremities Neurologic:  cranial nerves II-XII intact and gait normal.     PPM Specifications Following MD:  Lewayne Bunting, MD     PPM Vendor:  [employer]     PPM Model Number:  [id number]     PPM Serial Number:  [id number] PPM DOI:  04/28/2002     PPM Implanting MD:  Lewayne Bunting, MD  Lead 1    Location: RA     DOI: 04/28/2002      Model #: [id number]     Serial #: [id number]     Status: active Lead 2    Location: RV     DOI: 04/28/2002     Model #: [id number]     Serial #: [id number]     Status: active  Magnet Response Rate:  BOL 85 ERI  65  Indications:  A-fib   PPM Follow Up Remote Check?  No Battery Voltage:  2.68 V     Battery Est. Longevity:  13 months     Pacer Dependent:  No       PPM Device Measurements Atrium  Amplitude: 2.0 mV, Impedance: 400 ohms, Threshold: 0.5 V at 0.4 msec Right Ventricle  Amplitude: 2.8 mV, Impedance: 614 ohms, Threshold: 0.75 V at 0.4 msec  Episodes MS Episodes:  958     Percent Mode Switch:  8.5%     Coumadin:  No Ventricular High Rate:  324     Atrial Pacing:  90.1%     Ventricular Pacing:  63.4%  Parameters Mode:  DDDR     Lower Rate Limit:  75     Upper Rate Limit:  130 Paced AV Delay:  250     Sensed AV Delay:  250 Next Cardiology Appt Due:  03/22/2010 Tech Comments:  8.5% A-fib, - coumadin.  324 VHR episodes the longes 2:48 minutes.  841,324 single PVC's.  No parameter changes.  ROV 6 months clinic. Altha Harm, LPN  Sep 28, 2009 3:13 PM  MD Comments:  Agree with above.  Impression & Recommendations:  Problem # 1:  PACEMAKER, PERMANENT (ICD-V45.01) Her device is working normally.  Will recheck in several months.  Problem # 2:  HYPERTENSION (ICD-401.9) His blood pressure is well controlled. Continue current meds. A low sodium diet is recommended. Her updated medication list for this problem includes:    Metoprolol Tartrate 50 Mg Tabs (Metoprolol tartrate) .Marland Kitchen... Take 1 tablet by mouth every morning and 1/2 every evening    Micardis 80 Mg Tabs (Telmisartan) .Marland Kitchen... Take 1 tablet by mouth once a day  Problem # 3:  ATRIAL FIBRILLATION (ICD-427.31) Her atrial fibrillation is well controlled.  I have recommended she continue her current meds. Her updated medication list for this problem includes:    Metoprolol Tartrate 50 Mg Tabs (Metoprolol tartrate) .Marland Kitchen... Take 1 tablet by mouth  every morning and 1/2 every evening    Tikosyn 250 Mcg Caps (Dofetilide) .Marland Kitchen... Take 1 capsule by mouth every 12 hours  Patient Instructions: 1)  Your physician recommends that you schedule a follow-up appointment in: 6 months device clinic and 12 months  with Dr Ladona Ridgel

## 2010-06-21 NOTE — Assessment & Plan Note (Signed)
Summary: BACK PAIN // RS   Vital Signs:  Patient profile:   77 year old female Temp:     97.8 degrees F oral Pulse rate:   84 / minute Pulse rhythm:   regular Resp:     14 per minute BP supine:   108 / 70  (left arm)  Vitals Entered By: Gladis Riffle, RN (July 23, 2009 11:02 AM) CC: c/o back pain left middle back since fell and fractured bone in July Is Patient Diabetic? Yes Did you bring your meter with you today? No Comments does not check CBGs at home   CC:  c/o back pain left middle back since fell and fractured bone in July.  History of Present Illness: mid- thoracic back pain 6 months---hurts with any activity no chest pain SOB, PND, orthopnea  Anxiety---doing reasonably well  tremor---stable, able to do ADLs  All other systems reviewed and were negative   Preventive Screening-Counseling & Management  Alcohol-Tobacco     Smoking Status: never  Current Medications (verified): 1)  Amaryl 4 Mg Tabs (Glimepiride) .... Take 1 Tablet By Mouth Once A Day 2)  Diazepam 10 Mg Tabs (Diazepam) .... 1/2 Two Times A Day and  Hs As Needed--Must Last 30 Days or More 3)  Metoprolol Tartrate 50 Mg Tabs (Metoprolol Tartrate) .... Take 1 Tablet By [facility] Morning and 1/2 Every Evening 4)  Micardis 80 Mg Tabs (Telmisartan) .... Take 1 Tablet By Mouth Once A Day 5)  Onetouch Test  Strp (Glucose Blood) .... Test Daily 6)  Zoloft 50 Mg Tabs (Sertraline Hcl) .... One By Mouth Daily 7)  Klor-Con M20 20 Meq  Tbcr (Potassium Chloride Crys Cr) .... Once Daily 8)  Tikosyn 250 Mcg  Caps (Dofetilide) .... Take 1 Capsule By Mouth Every 12 Hours 9)  Levbid 0.375 Mg  Tb12 (Hyoscyamine Sulfate) .... One Twice Daily 10)  Amitiza 24 Mcg  Caps (Lubiprostone) .... One By Mouth Bid 11)  Levothroid 75 Mcg Tabs (Levothyroxine Sodium) .... Take 1 Tablet By Mouth Once A Day 12)  Meclizine Hcl 25 Mg Tabs (Meclizine Hcl) .Marland Kitchen.. 1 By Mouth Two Times A Day As Needed Vertigo 13)  Hyomax-Sr 0.375 Mg Xr12h-Tab  (Hyoscyamine Sulfate) .... Take 1 Tablet By Mouth Two Times A Day 14)  Tramadol Hcl 50 Mg Tabs (Tramadol Hcl) .... One To Two Tablets By Mouth Q 6 Hours As Needed Pain 15)  Tylenol Extra Strength 500 Mg Tabs (Acetaminophen) .... As Needed 16)  Gas-X Extra Strength 125 Mg Caps (Simethicone) .... As Needed  Allergies: 1)  ! Codeine Sulfate (Codeine Sulfate) 2)  ! Simvastatin (Simvastatin)  Past History:  Past Medical History: Last updated: 09/04/2008 chronic AC BACK PAIN (ICD-724.5) OSTEOPOROSIS (ICD-733.00) HYPERLIPIDEMIA (ICD-272.4) OTHER AND UNSPECIFIED HYPERLIPIDEMIA (ICD-272.4) TREMOR, ESSENTIAL (ICD-333.1) EAR PAIN, LEFT (ICD-388.70) CONGESTIVE HEART FAILURE (ICD-428.0) ANTICOAGULATION THERAPY (ICD-V58.61) DIABETIC PERIPHERAL NEUROPATHY (ICD-250.60) HX, PERSONAL, HEALTH HAZARDS NEC (ICD-V15.89) HYPOTHYROIDISM (ICD-244.9) HYPERTENSION (ICD-401.9) DIVERTICULOSIS, COLON (ICD-562.10) DIABETES MELLITUS, TYPE II (ICD-250.00) ATRIAL FIBRILLATION (ICD-427.31) ANXIETY (ICD-300.00)    Past Surgical History: Last updated: 01/02/2007 Cataract extraction  2002 PPM L3 compression fx  Family History: Last updated: 2007-07-18 father deceased MI age 53 mother deceased 34 yo---unknown cause (sever essential tremor)  Social History: Last updated: 09/04/2008 Single Never Smoked Regular exercise-no Retired  Alcohol Use - no Drug Use - no  Risk Factors: Exercise: no (02/13/2007)  Risk Factors: Smoking Status: never (07/23/2009)  Physical Exam  General:  Well-developed,well-nourished,in no acute distress; alert,appropriate and cooperative throughout  examination Head:  normocephalic and atraumatic.   Eyes:  pupils equal and pupils round.   Ears:  R ear normal and L ear normal.   Neck:  No deformities, masses, or tenderness noted. Chest Wall:  No deformities, masses, or tenderness noted. Lungs:  normal respiratory effort and no accessory muscle use.   Heart:  normal rate  and regular rhythm.   Abdomen:  soft and non-tender.   Neurologic:  cranial nerves II-XII intact and gait normal.     Impression & Recommendations:  Problem # 1:  BACK PAIN (ICD-724.5) chronic in nature will check xray---r/o  The following medications were removed from the medication list:    Darvocet-n 100 100-650 Mg Tabs (Propoxyphene n-apap) .Marland Kitchen... Take 1 tablet by mouth two times a day as needed Her updated medication list for this problem includes:    Tramadol Hcl 50 Mg Tabs (Tramadol hcl) ..... One to two tablets by mouth q 6 hours as needed pain    Tylenol Extra Strength 500 Mg Tabs (Acetaminophen) .Marland Kitchen... As needed  Orders: T-Lumbar Spine Complete, 5 Views [phone])  Problem # 2:  HYPERLIPIDEMIA (ICD-272.4) no meds Labs Reviewed: SGOT: 14 (07/02/2009)   SGPT: 16 (07/02/2009)   HDL:34.60 (07/02/2009), 27.70 (03/11/2009)  LDL:DEL (03/17/2008), DEL (12/09/2007)  Chol:216 (07/02/2009), 208 (03/11/2009)  Trig:437.0 (07/02/2009), 352.0 (03/11/2009)  Problem # 3:  HYPOTHYROIDISM (ICD-244.9) well controlled Her updated medication list for this problem includes:    Levothroid 75 Mcg Tabs (Levothyroxine sodium) .Marland Kitchen... Take 1 tablet by mouth once a day  Labs Reviewed: TSH: 0.69 (07/02/2009)    HgBA1c: 7.8 (07/02/2009) Chol: 216 (07/02/2009)   HDL: 34.60 (07/02/2009)   LDL: DEL (03/17/2008)   TG: 437.0 (07/02/2009)  Problem # 4:  HYPERTENSION (ICD-401.9) controlled continue current medications  Her updated medication list for this problem includes:    Metoprolol Tartrate 50 Mg Tabs (Metoprolol tartrate) .Marland Kitchen... Take 1 tablet by mouthevery morning and 1/2 every evening    Micardis 80 Mg Tabs (Telmisartan) .Marland Kitchen... Take 1 tablet by mouth once a day  BP today: 108/70 Prior BP: 114/80 (07/02/2009)  Labs Reviewed: K+: 4.4 (07/02/2009) Creat: : 1.2 (07/02/2009)   Chol: 216 (07/02/2009)   HDL: 34.60 (07/02/2009)   LDL: DEL (03/17/2008)   TG: 437.0 (07/02/2009)  Complete Medication  List: 1)  Amaryl 4 Mg Tabs (Glimepiride) .... Take 1 tablet by mouth once a day 2)  Diazepam 10 Mg Tabs (Diazepam) .... 1/2 two times a day and  hs as needed--must last 30 days or more 3)  Metoprolol Tartrate 50 Mg Tabs (Metoprolol tartrate) .... Take 1 tablet by mouthevery morning and 1/2 every evening 4)  Micardis 80 Mg Tabs (Telmisartan) .... Take 1 tablet by mouth once a day 5)  Onetouch Test Strp (Glucose blood) .... Test daily as directed 6)  Zoloft 50 Mg Tabs (Sertraline hcl) .... One by mouth daily 7)  Klor-con M20 20 Meq Tbcr (Potassium chloride crys cr) .... Once daily 8)  Tikosyn 250 Mcg Caps (Dofetilide) .... Take 1 capsule by mouth every 12 hours 9)  Levbid 0.375 Mg Tb12 (Hyoscyamine sulfate) .... One twice daily 10)  Amitiza 24 Mcg Caps (Lubiprostone) .... One by mouth bid 11)  Levothroid 75 Mcg Tabs (Levothyroxine sodium) .... Take 1 tablet by mouth once a day 12)  Meclizine Hcl 25 Mg Tabs (Meclizine hcl) .Marland Kitchen.. 1 by mouth two times a day as needed vertigo 13)  Hyomax-sr 0.375 Mg Xr12h-tab (Hyoscyamine sulfate) .... Take 1 tablet by mouth two  times a day 14)  Tramadol Hcl 50 Mg Tabs (Tramadol hcl) .... One to two tablets by mouth q 6 hours as needed pain 15)  Tylenol Extra Strength 500 Mg Tabs (Acetaminophen) .... As needed 16)  Gas-x Extra Strength 125 Mg Caps (Simethicone) .... As needed 17)  Onetouch Ultra System W/device Kit (Blood glucose monitoring suppl) .... Use once daily as directed 18)  Vitamin D 1000 Unit Tabs (Cholecalciferol) .... Once daily Prescriptions: LEVBID 0.375 MG  TB12 (HYOSCYAMINE SULFATE) one twice daily  #60 Tablet x 2   Entered by:   Willy Eddy, LPN   Authorized by:   Birdie Sons MD   Signed by:   Willy Eddy, LPN on 16/02/9603   Method used:   Electronically to        [facility] [phone]* (retail)       [address], [place]  [address]       Ph: [phone] or [phone]       Fax: [phone]   RxID:    [id number] [facility] ULTRA SYSTEM W/DEVICE KIT (BLOOD GLUCOSE MONITORING SUPPL) use once daily as directed  #1 x 0   Entered by:   Gladis Riffle, RN   Authorized by:   Birdie Sons MD   Signed by:   Gladis Riffle, RN on 07/27/2009   Method used:   Electronically to        [facility] [phone]* (retail)       [address]       [place], [place]  [address]       Ph: [phone] or [phone]       Fax: [phone]   RxID:   [id number] [facility] TEST  STRP (GLUCOSE BLOOD) test daily as directed  #100 x 3   Entered by:   Gladis Riffle, RN   Authorized by:   Birdie Sons MD   Signed by:   Gladis Riffle, RN on 07/27/2009   Method used:   Electronically to        [facility] [phone]* (retail)       [address] [place]  [address]       Ph: [phone] or [phone]       Fax: [phone]   RxID:   [id number] TRAMADOL HCL 50 MG TABS (TRAMADOL HCL) one to two tablets by mouth q 6 hours as needed pain  #60 Tablet x 0   Entered and Authorized by:   Birdie Sons MD   Signed by:   Birdie Sons MD on 07/23/2009   Method used:   Electronically to        [facility] [phone]* (retail)       [address]       [place], [place]  [address]       Ph: [phone] or [phone]       Fax: [phone]   RxID:   [id number]

## 2010-06-21 NOTE — Progress Notes (Signed)
Summary: cough  Phone Note Call from Patient   Caller: Patient Call For: Birdie Sons MD Summary of Call: Pt. is calling to ask for RX for URI and cough .........lungs are sore, and cough is productive.  No fever.   [facility] Phone is out of order.  Follow-up for Phone Call        allerx DM  two times a day for 10 days Follow-up by: Stacie Glaze MD,  May 24, 2009 2:56 PM

## 2010-06-21 NOTE — Assessment & Plan Note (Signed)
Summary: divertic   Vital Signs:  Patient profile:   77 year old female Height:      66 inches (167.64 cm) Weight:      172.31 pounds (78.32 kg) O2 Sat:      98 % on Room air Temp:     97.6 degrees F (36.44 degrees C) oral Pulse rate:   88 / minute BP sitting:   108 / 70  (left arm) Cuff size:   regular  Vitals Entered By: Josph Macho RMA (November 03, 2009 2:22 PM)  O2 Flow:  Room air CC: Divert- left side pain/ pt wants refill on Zoloft, Glimepirde, and Diazepam/ CF Is Patient Diabetic? Yes   History of Present Illness: Patient in complaining of several days of worsening left lower quadrant. She continues to move her bowels every several days but has to strain at times. She denies fevers/chills/nausea or vomitting but her appetite is down slightly and she is struggling with increased fatigue. She was hospitalized in May and cardiology adjusted her cardiac condition. She had been feeling well since then. Reviewing her chart it looks as if cardiology d/ced her Glimepiride but she has continued to take it. She denies any low blood sugars and would like a refill  Current Medications (verified): 1)  Metoprolol Tartrate 50 Mg Tabs (Metoprolol Tartrate) .... Take 1 Tablet By Mouth Every Morning and 1/2 Every Evening 2)  Micardis 80 Mg Tabs (Telmisartan) .... Take 1 Tablet By Mouth Once A Day 3)  Onetouch Test  Strp (Glucose Blood) .... Test Daily As Directed 4)  Zoloft 50 Mg Tabs (Sertraline Hcl) .... One By Mouth Daily 5)  Tikosyn 250 Mcg  Caps (Dofetilide) .... Take 1 Capsule By Mouth Every 12 Hours 6)  Amitiza 24 Mcg  Caps (Lubiprostone) .... One By Mouth Bid 7)  Levothroid 75 Mcg Tabs (Levothyroxine Sodium) .... Take 1 Tablet By Mouth Once A Day 8)  Hyomax-Sr 0.375 Mg Xr12h-Tab (Hyoscyamine Sulfate) .... Take 1 Tablet By Mouth Two Times A Day 9)  Tylenol Extra Strength 500 Mg Tabs (Acetaminophen) .... As Needed 10)  Gas-X Extra Strength 125 Mg Caps (Simethicone) .... As Needed 11)   Onetouch Ultra System W/device Kit (Blood Glucose Monitoring Suppl) .... Use Once Daily As Directed 12)  Vitamin D3 2000 Unit Caps (Cholecalciferol) .... Qd  Allergies (verified): 1)  ! Codeine Sulfate (Codeine Sulfate) 2)  ! Simvastatin (Simvastatin)  Past History:  Past medical history reviewed for relevance to current acute and chronic problems. Social history (including risk factors) reviewed for relevance to current acute and chronic problems.  Past Medical History: Reviewed history from 09/04/2008 and no changes required. chronic AC BACK PAIN (ICD-724.5) OSTEOPOROSIS (ICD-733.00) HYPERLIPIDEMIA (ICD-272.4) OTHER AND UNSPECIFIED HYPERLIPIDEMIA (ICD-272.4) TREMOR, ESSENTIAL (ICD-333.1) EAR PAIN, LEFT (ICD-388.70) CONGESTIVE HEART FAILURE (ICD-428.0) ANTICOAGULATION THERAPY (ICD-V58.61) DIABETIC PERIPHERAL NEUROPATHY (ICD-250.60) HX, PERSONAL, HEALTH HAZARDS NEC (ICD-V15.89) HYPOTHYROIDISM (ICD-244.9) HYPERTENSION (ICD-401.9) DIVERTICULOSIS, COLON (ICD-562.10) DIABETES MELLITUS, TYPE II (ICD-250.00) ATRIAL FIBRILLATION (ICD-427.31) ANXIETY (ICD-300.00)    Social History: Reviewed history from 09/04/2008 and no changes required. Single Never Smoked Regular exercise-no Retired  Alcohol Use - no Drug Use - no  Review of Systems      See HPI  Physical Exam  General:  Well-developed,well-nourished,in no acute distress; alert,appropriate and cooperative throughout examination Head:  Normocephalic and atraumatic without obvious abnormalities.  Ears:  External ear exam shows no significant lesions or deformities.  Otoscopic examination reveals clear canals, tympanic membranes are intact bilaterally without bulging, retraction, inflammation or  discharge. Hearing is grossly normal bilaterally. Nose:  External nasal examination shows no deformity or inflammation. Nasal mucosa are pink and moist without lesions or exudates. Mouth:  Oral mucosa and oropharynx without lesions  or exudates.  Teeth in good repair. Neck:  No deformities, masses, or tenderness noted. Lungs:  Normal respiratory effort, chest expands symmetrically. Lungs are clear to auscultation, no crackles or wheezes. Heart:  Normal rate and regular rhythm. S1 and S2 normal without gallop, murmur, click, rub or other extra sounds. Abdomen:  no distention, no masses, no guarding, and no rebound tenderness.  Tender with palpation in LLQ Extremities:  No clubbing, cyanosis, edema, or deformity noted with normal full range of motion of all joints.   Neurologic:  No cranial nerve deficits noted. Station and gait are normal. Plantar reflexes are down-going bilaterally. DTRs are symmetrical throughout. Sensory, motor and coordinative functions appear intact. Psych:  Cognition and judgment appear intact. Alert and cooperative with normal attention span and concentration. No apparent delusions, illusions, hallucinations   Impression & Recommendations:  Problem # 1:  ABDOMINAL PAIN, LEFT LOWER QUADRANT (ICD-789.04)  Her updated medication list for this problem includes:    Amitiza 24 Mcg Caps (Lubiprostone) ..... One by mouth bid    Gas-x Extra Strength 125 Mg Caps (Simethicone) .Marland Kitchen... As needed  Orders: UA Dipstick w/o Micro (automated)  (81003) TLB-CBC Platelet - w/Differential (85025-CBCD) Possilbe early Diverticulosis, started on Ciprofloxacin and Flagyl. F/U next week as scheduled.  Problem # 2:  HYPERTENSION (ICD-401.9)  Her updated medication list for this problem includes:    Metoprolol Tartrate 50 Mg Tabs (Metoprolol tartrate) .Marland Kitchen... Take 1 tablet by mouth every morning and 1/2 every evening    Micardis 80 Mg Tabs (Telmisartan) .Marland Kitchen... Take 1 tablet by mouth once a day Well controlled no change in therapy  Problem # 3:  DIABETES MELLITUS, TYPE II (ICD-250.00)  Her updated medication list for this problem includes:    Micardis 80 Mg Tabs (Telmisartan) .Marland Kitchen... Take 1 tablet by mouth once a day     Glimepiride 4 Mg Tabs (Glimepiride) .Marland Kitchen... 1 tab by mouth once daily, given refill on this today has continued to take it over past months  Problem # 4:  ATRIAL FIBRILLATION (ICD-427.31)  Her updated medication list for this problem includes:    Metoprolol Tartrate 50 Mg Tabs (Metoprolol tartrate) .Marland Kitchen... Take 1 tablet by mouth every morning and 1/2 every evening    Tikosyn 250 Mcg Caps (Dofetilide) .Marland Kitchen... Take 1 capsule by mouth every 12 hours  Is following with cardiology and is asymptomatic, was hospitalized last month, is in regular rhythm today  Complete Medication List: 1)  Metoprolol Tartrate 50 Mg Tabs (Metoprolol tartrate) .... Take 1 tablet by mouth every morning and 1/2 every evening 2)  Micardis 80 Mg Tabs (Telmisartan) .... Take 1 tablet by mouth once a day 3)  Onetouch Test Strp (Glucose blood) .... Test daily as directed 4)  Zoloft 50 Mg Tabs (Sertraline hcl) .... One by mouth daily 5)  Tikosyn 250 Mcg Caps (Dofetilide) .... Take 1 capsule by mouth every 12 hours 6)  Amitiza 24 Mcg Caps (Lubiprostone) .... One by mouth bid 7)  Levothroid 75 Mcg Tabs (Levothyroxine sodium) .... Take 1 tablet by mouth once a day 8)  Hyomax-sr 0.375 Mg Xr12h-tab (Hyoscyamine sulfate) .... Take 1 tablet by mouth two times a day 9)  Tylenol Extra Strength 500 Mg Tabs (Acetaminophen) .... As needed 10)  Gas-x Extra Strength 125 Mg Caps (  Simethicone) .... As needed 11)  Onetouch Ultra System W/device Kit (Blood glucose monitoring suppl) .... Use once daily as directed 12)  Vitamin D3 2000 Unit Caps (Cholecalciferol) .... Qd 13)  Ciprofloxacin Hcl 250 Mg Tabs (Ciprofloxacin hcl) .Marland Kitchen.. 1 tab by mouth two times a day x 10 days 14)  Metronidazole 500 Mg Tabs (Metronidazole) .Marland Kitchen.. 1 tab by mouth three times a day x 10d 15)  Glimepiride 4 Mg Tabs (Glimepiride) .Marland Kitchen.. 1 tab by mouth qd  Patient Instructions: 1)  Maintain adequate hydration and hi fiber diet.  2)  Take 650-1000mg  of Tylenol every 4-6 hours as  needed for relief of pain or comfort of fever but DO NOT take more than 4 grams in a 24 hour period (can cause liver damage in higher doses), f/u with PMD as scheduled Prescriptions: GLIMEPIRIDE 4 MG TABS (GLIMEPIRIDE) 1 tab by mouth qd  #30 x 0   Entered and Authorized by:   Danise Edge MD   Signed by:   Danise Edge MD on 11/03/2009   Method used:   Electronically to        [facility]  [phone]* (retail)       [address]       [place], [place]  [address]       Ph: [phone] or [phone]       Fax: [phone]   RxID:   [id number] METRONIDAZOLE 500 MG TABS (METRONIDAZOLE) 1 tab by mouth three times a day x 10d  #30 x 0   Entered and Authorized by:   Danise Edge MD   Signed by:   Danise Edge MD on 11/03/2009   Method used:   Electronically to        [facility]  [phone]* (retail)       [address]       [place], [place]  [address]       Ph: [phone] or [phone]       Fax: [phone]   RxID:   [id number] ZOLOFT 50 MG TABS (SERTRALINE HCL) one by mouth daily  #30 x 0   Entered and Authorized by:   Danise Edge MD   Signed by:   Danise Edge MD on 11/03/2009   Method used:   Electronically to        [facility]  [phone]* (retail)       [address]       [place], [place]  [address]       Ph: [phone] or [phone]       Fax: [phone]   RxID:   [id number]   Laboratory Results   Urine Tests    Routine Urinalysis   Color: yellow Appearance: Clear Glucose: negative   (Normal Range: Negative) Bilirubin: negative   (Normal Range: Negative) Ketone: negative   (Normal Range: Negative) Spec. Gravity: 1.015   (Normal Range: 1.003-1.035) Blood: negative   (Normal Range: Negative) pH: 6.0   (Normal Range: 5.0-8.0) Protein: negative   (Normal Range: Negative) Urobilinogen: 0.2   (Normal Range: 0-1) Nitrite: negative   (Normal  Range: Negative) Leukocyte Esterace: 3+   (Normal Range: Negative)    Comments: Rita Ohara  November 03, 2009 5:23 PM

## 2010-06-21 NOTE — Progress Notes (Signed)
Summary: hearing referral  Phone Note Call from Patient   Caller: Patient friend via phone Call For: Birdie Sons MD Summary of Call: needs referral to [facility] faxed to [phone].  Has appt 6/29/@ 11 am. Initial call taken by: Gladis Riffle, RN,  November 08, 2009 4:21 PM  Follow-up for Phone Call        see referral.  Order in process. Follow-up by: Gladis Riffle, RN,  November 08, 2009 4:23 PM  New Problems: HEARING LOSS (ICD-389.9)   New Problems: HEARING LOSS (ICD-389.9)

## 2010-06-21 NOTE — Procedures (Signed)
Summary: Colon   Colonoscopy  Procedure date:  03/21/2006  Findings:      Location:  [facility].   Patient Name: [name], [name] MRN:  Procedure Procedures: Colonoscopy CPT: [phone].  Personnel: Endoscopist: Vania Rea. Jarold Motto, MD.  Exam Location: Exam performed in Outpatient Clinic. Outpatient  Patient Consent: Procedure, Alternatives, Risks and Benefits discussed, consent obtained, from patient. Consent was obtained by the RN.  Indications Symptoms: Constipation Patient has difficulty evacuating, strains with stool passage. Hematochezia.  Comments: Hx. of diverticulosis... History  Current Medications: Patient is currently taking Coumadin.  Medical/ Surgical History: Atrial Fibrillation, Pacemaker. Congestive Heart Failure, Adult Onset Diabetes, Hypothyroidism, Depression, Hyperlipidemia,  Pre-Exam Physical: Performed Mar 21, 2006. Cardio-pulmonary exam, Rectal exam, Abdominal exam, Extremity exam, Mental status exam WNL.  Comments: Pt. history reviewed/updated, physical exam performed prior to initiation of sedation?yes Exam Exam: Extent of exam reached: Cecum, extent intended: Cecum.  The cecum was identified by appendiceal orifice and IC valve. Patient position: on left side. Colon retroflexion performed. Images taken. ASA Classification: II. Tolerance: excellent.  Monitoring: Pulse and BP monitoring, Oximetry used. Supplemental O2 given. at 2 Liters.  Colon Prep Used Golytely for colon prep. Prep results: fair, adequate exam.  Sedation Meds: Patient assessed and found to be appropriate for moderate (conscious) sedation. Fentanyl 75 mcg. given IV. Versed 7 mg. given IV.  Instrument(s): CF 140L. Serial [id number].  Findings - DIVERTICULOSIS: Cecum to Sigmoid Colon. Not bleeding. ICD9: Diverticulosis, Colon: 562.10. Comments: Severe extensive complex tics noted.  - NORMAL EXAM: Cecum to Rectum. Not Seen: Polyps. AVM's. Colitis. Tumors.  Melanosis. Crohn's.  - HEMORRHOIDS: Internal. Size: Small. Not bleeding. Not thrombosed. ICD9: Hemorrhoids, Internal: 455.0.   Assessment  Diagnoses: 562.10: Diverticulosis, Colon.  455.0: Hemorrhoids, Internal.   Events  Unplanned Interventions: No intervention was required.  Plans Medication Plan: Anti-constipation: Amitizia BID, starting Mar 21, 2006 for 4 wks.   Patient Education: Patient given standard instructions for: Diverticulosis. Hemorrhoids. High fiber diet.  Comments: Prn local anal care Disposition: After procedure patient sent to recovery. After recovery patient sent home.  Scheduling/Referral: Clinic Visit, to Vania Rea. Jarold Motto, MD, around Apr 20, 2006.    cc: Birdie Sons, MD     Lewayne Bunting, MD     This report was created from the original endoscopy report, which was reviewed and signed by the above listed endoscopist.

## 2010-06-21 NOTE — Assessment & Plan Note (Signed)
Summary: abd pain--ch.    History of Present Illness Visit Type: consult  Primary GI MD: Sheryn Bison MD FACP FAGA Primary Provider: Birdie Sons MD Requesting Provider: Birdie Sons MD Chief Complaint: constipation, and lower abd pain  History of Present Illness:   77 year old Caucasian female with multiple medical problems including adult onset diabetes, peripheral neuropathy, congestive heart failure with hypertension, atrial fibrillation, and chronic anxiety and depression. She has recurrent hemorrhoidal bleeding responsive to local therapy, and also has severe chronic constipation with rectal outlet dysfunction. She has responded well to Amitiza therapy. She is up-to-date on her colonoscopy exams which had shown diverticulosis. She follows a high fiber diet without difficulty and uses p.r.n. Levsin. Currently she denies problems besides incomplete rectal emptying. She is on chronic thyroid replacement therapy and is followed by Dr. Smitty Cords SWords.   GI Review of Systems    Reports abdominal pain.     Location of  Abdominal pain: lower abdomen.    Denies acid reflux, belching, bloating, chest pain, dysphagia with liquids, dysphagia with solids, heartburn, loss of appetite, nausea, vomiting, vomiting blood, weight loss, and  weight gain.      Reports constipation.     Denies anal fissure, black tarry stools, change in bowel habit, diarrhea, diverticulosis, fecal incontinence, heme positive stool, hemorrhoids, irritable bowel syndrome, jaundice, light color stool, liver problems, rectal bleeding, and  rectal pain.    Current Medications (verified): 1)  Metoprolol Tartrate 50 Mg Tabs (Metoprolol Tartrate) .... Take 1 Tablet By Mouth Every Morning and 1/2 Every Evening 2)  Micardis 80 Mg Tabs (Telmisartan) .... Take 1 Tablet By Mouth Once A Day 3)  Onetouch Test  Strp (Glucose Blood) .... Test Daily As Directed 4)  Zoloft 50 Mg Tabs (Sertraline Hcl) .... One By Mouth Daily 5)  Tikosyn 250  Mcg  Caps (Dofetilide) .... Take 1 Capsule By Mouth Every 12 Hours 6)  Amitiza 24 Mcg  Caps (Lubiprostone) .... One By Mouth Bid 7)  Levothroid 75 Mcg Tabs (Levothyroxine Sodium) .... Take 1 Tablet By Mouth Once A Day 8)  Hyomax-Sr 0.375 Mg Xr12h-Tab (Hyoscyamine Sulfate) .... Take 1 Tablet By Mouth Two Times A Day 9)  Tylenol Extra Strength 500 Mg Tabs (Acetaminophen) .... As Needed 10)  Gas-X Extra Strength 125 Mg Caps (Simethicone) .... As Needed 11)  Onetouch Ultra System W/device Kit (Blood Glucose Monitoring Suppl) .... Use Once Daily As Directed 12)  Vitamin D3 2000 Unit Caps (Cholecalciferol) .... Qd 13)  Glimepiride 4 Mg Tabs (Glimepiride) .Marland Kitchen.. 1 Tab By Mouth Qd 14)  Diazepam 2 Mg  Tabs (Diazepam) .... 1/2-1 By Mouth At Bedtime As Needed 15)  Ferrous Sulfate 325 (65 Fe) Mg Tabs (Ferrous Sulfate) .... Once Daily 16)  Aspirin 325 Mg Tabs (Aspirin) .... Once Daily  Allergies (verified): 1)  ! Codeine Sulfate (Codeine Sulfate) 2)  ! Simvastatin (Simvastatin)  Past History:  Past medical, surgical, family and social histories (including risk factors) reviewed for relevance to current acute and chronic problems.  Past Medical History: Reviewed history from 09/04/2008 and no changes required. chronic AC BACK PAIN (ICD-724.5) OSTEOPOROSIS (ICD-733.00) HYPERLIPIDEMIA (ICD-272.4) OTHER AND UNSPECIFIED HYPERLIPIDEMIA (ICD-272.4) TREMOR, ESSENTIAL (ICD-333.1) EAR PAIN, LEFT (ICD-388.70) CONGESTIVE HEART FAILURE (ICD-428.0) ANTICOAGULATION THERAPY (ICD-V58.61) DIABETIC PERIPHERAL NEUROPATHY (ICD-250.60) HX, PERSONAL, HEALTH HAZARDS NEC (ICD-V15.89) HYPOTHYROIDISM (ICD-244.9) HYPERTENSION (ICD-401.9) DIVERTICULOSIS, COLON (ICD-562.10) DIABETES MELLITUS, TYPE II (ICD-250.00) ATRIAL FIBRILLATION (ICD-427.31) ANXIETY (ICD-300.00)    Past Surgical History: Reviewed history from 01/02/2007 and no changes required. Cataract extraction  2002 PPM L3 compression fx  Family  History: Reviewed history from 06/19/2007 and no changes required. father deceased MI age 73 mother deceased 70 yo---unknown cause (sever essential tremor) No FH of Colon Cancer:  Social History: Reviewed history from 09/04/2008 and no changes required. Retired  widowed  Never Smoked Regular exercise-no Alcohol Use - no Drug Use - no  Review of Systems       The patient complains of arthritis/joint pain, back pain, and urination - excessive.  The patient denies allergy/sinus, anemia, anxiety-new, blood in urine, breast changes/lumps, change in vision, confusion, cough, coughing up blood, depression-new, fainting, fatigue, fever, headaches-new, hearing problems, heart murmur, heart rhythm changes, itching, menstrual pain, muscle pains/cramps, night sweats, nosebleeds, pregnancy symptoms, shortness of breath, skin rash, sleeping problems, sore throat, swelling of feet/legs, swollen lymph glands, thirst - excessive , urination - excessive , urination changes/pain, urine leakage, vision changes, and voice change.    Vital Signs:  Patient profile:   77 year old female Height:      66 inches Weight:      172 pounds BMI:     27.86 BSA:     1.88 Pulse rate:   68 / minute Pulse rhythm:   regular BP sitting:   100 / 60  (left arm) Cuff size:   regular  Vitals Entered By: Ok Anis CMA (December 09, 2009 10:14 AM)  Physical Exam  General:  Well developed, well nourished, no acute distress.healthy appearing.  healthy appearing.   Head:  Normocephalic and atraumatic. Eyes:  PERRLA, no icterus.exam deferred to patient's ophthalmologist.  exam deferred to patient's ophthalmologist.   Abdomen:  Soft, nontender and nondistended. No masses, hepatosplenomegaly or hernias noted. Normal bowel sounds.No distention, masses, or abnormal bowel sounds. Rectal:  external hemorrhoids present which are nonbleeding. Rectal exam showed no masses or tenderness guaiac negative stool. Extremities:  No clubbing,  cyanosis, edema or deformities noted.1+ pedal edema.  Bluish feet bilaterally with decreased pulses in left foot but no tenderness, or evidence of cellulitis.1+ pedal edema.   Neurologic:  Alert and  oriented x4;  grossly normal neurologically. Psych:  Alert and cooperative. Normal mood and affect.   Impression & Recommendations:  Problem # 1:  HEMORRHOIDS, EXTERNAL (ICD-455.3) Assessment Unchanged Continue local anal care as needed Orders: TLB-CBC Platelet - w/Differential (85025-CBCD) TLB-B12, Serum-Total ONLY (16109-U04) TLB-Ferritin (82728-FER) TLB-Folic Acid (Folate) (82746-FOL) TLB-IBC Pnl (Iron/FE;Transferrin) (83550-IBC)  Problem # 2:  CONSTIPATION (ICD-564.00) Assessment: Improved Amitiza 24 micrograms twice a day as tolerated with MiraLax 8 ounces at bedtime as tolerated. Continue high fiber diet with liberal p.o. fluids. I will repeat her CBC and anemia profile for a history of chronic anemia. She apparently is on daily ferrous sulfate. Orders: TLB-CBC Platelet - w/Differential (85025-CBCD) TLB-B12, Serum-Total ONLY (54098-J19) TLB-Ferritin (82728-FER) TLB-Folic Acid (Folate) (82746-FOL) TLB-IBC Pnl (Iron/FE;Transferrin) (83550-IBC)  Problem # 3:  ABDOMINAL PAIN, LEFT LOWER QUADRANT (ICD-789.04) Assessment: Improved History of Diverticulosis exacerbated by constipation. Review of her record shows no evidence recurrence previously of ischemic bowel disease.  Problem # 4:  PACEMAKER, PERMANENT (ICD-V45.01) Assessment: Comment Only  Problem # 5:  TREMOR, ESSENTIAL (ICD-333.1) Assessment: Improved  Problem # 6:  CONGESTIVE HEART FAILURE (ICD-428.0) Assessment: Improved continue multiple other medications listed and reviewed in her records.  Problem # 7:  DIABETIC PERIPHERAL NEUROPATHY (ICD-250.60) Assessment: Comment Only  Problem # 8:  DIABETES MELLITUS, TYPE II (ICD-250.00) Assessment: Improved  Problem # 9:  ANXIETY (ICD-300.00) Assessment:  Improved  Patient Instructions: 1)  Please go to the basement for lab work. 2)  Begin Miralax at bedtime. 3)  Take amitiza 24 micrograms two times a day. 4)  The medication list was reviewed and reconciled.  All changed / newly prescribed medications were explained.  A complete medication list was provided to the patient / caregiver. 5)  Copy sent to : Dr. Birdie Sons and Dr. Sharrell Ku in cardiology. 6)  Please continue current medications.  7)  Diet should be high in fiber ( fruits, vegetables, whole grains) but low in residue. Drink at least eight (8) glasses of water a day.  8)  Constipation and Hemorrhoids brochure given.   Appended Document: abd pain--ch.    Clinical Lists Changes  Medications: Added new medication of MIRALAX   POWD (POLYETHYLENE GLYCOL 3350) Take as directed at bedtime Changed medication from AMITIZA 24 MCG  CAPS (LUBIPROSTONE) one by mouth bid to AMITIZA 24 MCG  CAPS (LUBIPROSTONE) one by mouth bid

## 2010-06-21 NOTE — Progress Notes (Signed)
Summary: INFO ONLY  Phone Note From Other Clinic   Caller: Patient Caller: Provider - Victorino Dike (operator # [id number]) w/ [facility] Summary of Call: INFO ONLY----------Provider Victorino Dike (operator # [id number]) w/ [employer] [facility]  [phone])  called to advised Dr Cato Mulligan that pt has been taken to [facility] ED via EMS ref c/o back pain, increased respirations, and diaphoresis.  Initial call taken by: Debbra Riding,  Oct 11, 2009 1:09 PM

## 2010-06-21 NOTE — Progress Notes (Signed)
Summary: Pt req antibiotic called in for diverticulitis  Phone Note Call from Patient Call back at Home Phone [phone]   Caller: Patient Summary of Call: Pt called and says that she believes that she has a flare up of diverticulitis. Pt req to get an antibiotics called in to [facility] on [address]. Pt says that this started last thursday and it has gotten a little better, but she still has discomfort under her ribs.      Initial call taken by: Lucy Antigua,  December 13, 2009 10:00 AM  Follow-up for Phone Call        OV  dr blythe Follow-up by: Birdie Sons MD,  December 13, 2009 12:10 PM  Additional Follow-up for Phone Call Additional follow up Details #1::        patient is aware Additional Follow-up by: Kern Reap CMA Duncan Dull),  December 13, 2009 2:09 PM     Appended Document: Pt req antibiotic called in for diverticulitis spoke with patient and she will call back tomorrow if not better

## 2010-06-21 NOTE — Therapy (Signed)
Summary: Hearing Evaluation/[facility]  Hearing Evaluation/[facility]   Imported By: Maryln Gottron 11/30/2009 13:31:48  _____________________________________________________________________  External Attachment:    Type:   Image     Comment:   External Document

## 2010-06-21 NOTE — Miscellaneous (Signed)
Summary: Discharge Summary for PT [facility]  Discharge Summary for PT Services/[facility]   Imported By: Maryln Gottron 04/20/2010 13:26:03  _____________________________________________________________________  External Attachment:    Type:   Image     Comment:   External Document

## 2010-06-21 NOTE — Progress Notes (Signed)
Summary: refill--diazepam  Phone Note Refill Request Message from:  Fax from [facility] on March 03, 2010 12:41 PM  Refills Requested: Medication #1:  DIAZEPAM 2 MG  TABS 1/2-1 by mouth at bedtime as needed   Dosage confirmed as above?Dosage Confirmed   Supply Requested: 1 month   Last Refilled: 01/31/2010 Initial call taken by: Mervin Kung CMA Duncan Dull),  March 03, 2010 12:41 PM  Follow-up for Phone Call        ok 30/1 refill Follow-up by: Birdie Sons MD,  March 03, 2010 4:17 PM  Additional Follow-up for Phone Call Additional follow up Details #1::        Rx left on pharmacy voicemail. Nicki Guadalajara Fergerson CMA Duncan Dull)  March 04, 2010 11:35 AM     Prescriptions: DIAZEPAM 2 MG  TABS (DIAZEPAM) 1/2-1 by mouth at bedtime as needed  #30 x 1   Entered by:   Mervin Kung CMA (AAMA)   Authorized by:   Birdie Sons MD   Signed by:   Mervin Kung CMA (AAMA) on 03/04/2010   Method used:   Telephoned to ...       [facility]  [address] [place] [phone]* (retail)       [phone]       [place], [place]  [address]       Ph: [phone] or [phone]       Fax: [phone]   RxID:   [id number]

## 2010-06-21 NOTE — Miscellaneous (Signed)
Summary: Initial Summary for PT Services/[facility]   Initial Summary for PT Services/[facility]   Imported By: Maryln Gottron 03/17/2010 11:22:09  _____________________________________________________________________  External Attachment:    Type:   Image     Comment:   External Document

## 2010-06-21 NOTE — Progress Notes (Signed)
Summary: [facility]: Micardis and Levothyroxine. Req change Losarten  Phone Note From Pharmacy Call back at [phone] [facility]   Caller: [facility] of Call: [facility] says that pt isnt sure if she takes Micardis or Levothyroxine. [facility] wants to change to Losarten?  Please call.  Initial call taken by: Lucy Antigua,  November 15, 2009 2:00 PM  Follow-up for Phone Call        Pharmacist called levothyroxin is on med list.  is it okay to switch to losartan?   Follow-up by: Kern Reap CMA Duncan Dull),  November 15, 2009 5:13 PM  Additional Follow-up for Phone Call Additional follow up Details #1::        stay on micardis  she gets very confused with medications (I'll discuss next OV) Additional Follow-up by: Birdie Sons MD,  November 20, 2009 6:26 PM    Additional Follow-up for Phone Call Additional follow up Details #2::    autumn from pharmacy notified pt to stay on micardis and levothyroxin.  Dr will discuss at next OV and pharmacist notified. Follow-up by: Gladis Riffle, RN,  November 23, 2009 2:53 PM

## 2010-06-21 NOTE — Progress Notes (Signed)
Summary: Pt refuses ov.Req antibiotics for diverticulitis  Phone Note Call from Patient Call back at Home Phone [phone]   Caller: Patient Call For: Birdie Sons MD Summary of Call: Pt refuses to come in for ov. Pt just wants antibiotic called in for diverticulitis. Pt is beginning to get upset. Pt says she is in a lot of discomfort. Pt says that 10 or 12 pills would be fine.      Initial call taken by: Lucy Antigua,  December 13, 2009 2:53 PM  Follow-up for Phone Call        Phone Call Completed Follow-up by: Kern Reap CMA Duncan Dull),  December 13, 2009 5:01 PM

## 2010-06-21 NOTE — Progress Notes (Signed)
Summary: new meter/ ?vit d  Phone Note Call from Patient Call back at [phone]   Caller: Daughter-janice Call For: Birdie Sons MD Summary of Call: pt needs new meter ultra touch 2 with test strips [facility] [address] [phone] also does pt need to continue vit d if so please call refill into pharmacy.  Initial call taken by: Heron Sabins,  July 23, 2009 1:30 PM  Follow-up for Phone Call        vit D 50000 U completed.  Now needs OTC Vit D 1000U once daily .Patient notified. She needs onetouch ultra 2 machine and test strips called in.  Both done electronically on last ov.  Pt aware. Follow-up by: Gladis Riffle, RN,  July 27, 2009 4:00 PM

## 2010-06-21 NOTE — Letter (Signed)
Summary: Diabetic Eye Exam/[facility]  Diabetic Eye Exam/[facility]   Imported By: Maryln Gottron 03/17/2010 11:25:53  _____________________________________________________________________  External Attachment:    Type:   Image     Comment:   External Document

## 2010-06-21 NOTE — Miscellaneous (Signed)
Summary: med list update   Clinical Lists Changes  Medications: Removed medication of CYCLOBENZAPRINE HCL 5 MG  TABS (CYCLOBENZAPRINE HCL) once daily as needed

## 2010-06-21 NOTE — Progress Notes (Signed)
Summary: diazepam refill  Phone Note Refill Request Message from:  Fax from Pharmacy on January 31, 2010 12:08 PM  Refills Requested: Medication #1:  DIAZEPAM 2 MG  TABS 1/2-1 by mouth at bedtime as needed Initial call taken by: Kern Reap CMA Duncan Dull),  January 31, 2010 12:08 PM    Prescriptions: DIAZEPAM 2 MG  TABS (DIAZEPAM) 1/2-1 by mouth at bedtime as needed  #30 x 0   Entered by:   Kern Reap CMA (AAMA)   Authorized by:   Birdie Sons MD   Signed by:   Kern Reap CMA (AAMA) on 01/31/2010   Method used:   Historical   RxID:   [id number]

## 2010-06-21 NOTE — Assessment & Plan Note (Signed)
Summary: F/U ON BACK PAIN // [facility] BMP/NJR   Vital Signs:  Patient profile:   77 year old female Weight:      174 pounds Temp:     98.4 degrees F oral Pulse rate:   76 / minute Pulse rhythm:   regular Resp:     12 per minute BP sitting:   106 / 64  (left arm) Cuff size:   regular  Vitals Entered By: Gladis Riffle, RN (September 06, 2009 11:39 AM) CC: FU back pain--continues to be periodic--does not check CBGs at home Is Patient Diabetic? Yes Did you bring your meter with you today? No Comments using cane to walk   CC:  FU back pain--continues to be periodic--does not check CBGs at home.  History of Present Illness:  Follow-Up Visit      This is an 77 year old woman who presents for Follow-up visit.  The patient denies chest pain and palpitations.  Since the last visit the patient notes no new problems or concerns.  The patient reports taking meds as prescribed.  When questioned about possible medication side effects, the patient notes none.  she is feeling quite well  back pain is better---still with intermittent pain  All other systems reviewed and were negative   Preventive Screening-Counseling & Management  Alcohol-Tobacco     Smoking Status: never  Current Problems (verified): 1)  Pacemaker, Permanent  (ICD-V45.01) 2)  Osteoporosis  (ICD-733.00) 3)  Hyperlipidemia  (ICD-272.4) 4)  Tremor, Essential  (ICD-333.1) 5)  Congestive Heart Failure  (ICD-428.0) 6)  Diabetic Peripheral Neuropathy  (ICD-250.60) 7)  Hypothyroidism  (ICD-244.9) 8)  Hypertension  (ICD-401.9) 9)  Diverticulosis, Colon  (ICD-562.10) 10)  Diabetes Mellitus, Type II  (ICD-250.00) 11)  Atrial Fibrillation  (ICD-427.31) 12)  Anxiety  (ICD-300.00)  Current Medications (verified): 1)  Amaryl 4 Mg Tabs (Glimepiride) .... Take 1 Tablet By Mouth Once A Day 2)  Diazepam 10 Mg Tabs (Diazepam) .... 1/2 Two Times A Day and  Hs As Needed--Must Last 30 Days or More 3)  Metoprolol Tartrate 50 Mg Tabs (Metoprolol  Tartrate) .... Take 1 Tablet By [facility] Morning and 1/2 Every Evening 4)  Micardis 80 Mg Tabs (Telmisartan) .... Take 1 Tablet By Mouth Once A Day 5)  Onetouch Test  Strp (Glucose Blood) .... Test Daily As Directed 6)  Zoloft 50 Mg Tabs (Sertraline Hcl) .... One By Mouth Daily 7)  Klor-Con M20 20 Meq  Tbcr (Potassium Chloride Crys Cr) .... Once Daily 8)  Tikosyn 250 Mcg  Caps (Dofetilide) .... Take 1 Capsule By Mouth Every 12 Hours 9)  Levbid 0.375 Mg  Tb12 (Hyoscyamine Sulfate) .... One Twice Daily 10)  Amitiza 24 Mcg  Caps (Lubiprostone) .... One By Mouth Bid 11)  Levothroid 75 Mcg Tabs (Levothyroxine Sodium) .... Take 1 Tablet By Mouth Once A Day 12)  Meclizine Hcl 25 Mg Tabs (Meclizine Hcl) .Marland Kitchen.. 1 By Mouth Two Times A Day As Needed Vertigo 13)  Hyomax-Sr 0.375 Mg Xr12h-Tab (Hyoscyamine Sulfate) .... Take 1 Tablet By Mouth Two Times A Day 14)  Tramadol Hcl 50 Mg Tabs (Tramadol Hcl) .... One To Two Tablets By Mouth Q 6 Hours As Needed Pain 15)  Tylenol Extra Strength 500 Mg Tabs (Acetaminophen) .... As Needed 16)  Gas-X Extra Strength 125 Mg Caps (Simethicone) .... As Needed 17)  Onetouch Ultra System W/device Kit (Blood Glucose Monitoring Suppl) .... Use Once Daily As Directed 18)  Vitamin D 1000 Unit Tabs (Cholecalciferol) .Marland KitchenMarland KitchenMarland Kitchen  Once Daily  Allergies: 1)  ! Codeine Sulfate (Codeine Sulfate) 2)  ! Simvastatin (Simvastatin)  Past History:  Past Medical History: Last updated: 09/04/2008 chronic AC BACK PAIN (ICD-724.5) OSTEOPOROSIS (ICD-733.00) HYPERLIPIDEMIA (ICD-272.4) OTHER AND UNSPECIFIED HYPERLIPIDEMIA (ICD-272.4) TREMOR, ESSENTIAL (ICD-333.1) EAR PAIN, LEFT (ICD-388.70) CONGESTIVE HEART FAILURE (ICD-428.0) ANTICOAGULATION THERAPY (ICD-V58.61) DIABETIC PERIPHERAL NEUROPATHY (ICD-250.60) HX, PERSONAL, HEALTH HAZARDS NEC (ICD-V15.89) HYPOTHYROIDISM (ICD-244.9) HYPERTENSION (ICD-401.9) DIVERTICULOSIS, COLON (ICD-562.10) DIABETES MELLITUS, TYPE II (ICD-250.00) ATRIAL  FIBRILLATION (ICD-427.31) ANXIETY (ICD-300.00)    Past Surgical History: Last updated: 01/02/2007 Cataract extraction  2002 PPM L3 compression fx  Family History: Last updated: 2007-07-02 father deceased MI age 42 mother deceased 42 yo---unknown cause (sever essential tremor)  Social History: Last updated: 09/04/2008 Single Never Smoked Regular exercise-no Retired  Alcohol Use - no Drug Use - no  Risk Factors: Exercise: no (02/13/2007)  Risk Factors: Smoking Status: never (09/06/2009)  Review of Systems       All other systems reviewed and were negative   Physical Exam  General:  alert and well-developed.   Head:  normocephalic and atraumatic.   Eyes:  pupils equal and pupils round.   Ears:  R ear normal and L ear normal.   Nose:  no external deformity and no external erythema.   Neck:  No deformities, masses, or tenderness noted. Chest Wall:  No deformities, masses, or tenderness noted. Lungs:  normal respiratory effort and no accessory muscle use.   Heart:  normal rate and regular rhythm.   Abdomen:  soft and non-tender.   Msk:  no localized reproducible back tenderness. Neurologic:  cranial nerves II-XII intact and gait normal.   Skin:  turgor normal and color normal.     Impression & Recommendations:  Problem # 1:  BACK PAIN (ICD-724.5) sxs are better but she still has intermittent pain advised exercise and stretching  Her updated medication list for this problem includes:    Tramadol Hcl 50 Mg Tabs (Tramadol hcl) ..... One to two tablets by mouth q 6 hours as needed pain    Tylenol Extra Strength 500 Mg Tabs (Acetaminophen) .Marland Kitchen... As needed  Problem # 2:  ATRIAL FIBRILLATION (ICD-427.31) not a warfarin candidate Her updated medication list for this problem includes:    Metoprolol Tartrate 50 Mg Tabs (Metoprolol tartrate) .Marland Kitchen... Take 1 tablet by mouthevery morning and 1/2 every evening    Tikosyn 250 Mcg Caps (Dofetilide) .Marland Kitchen... Take 1 capsule by mouth  every 12 hours  Reviewed the following: PT: 12.1 (05/03/2007)   INR: 1.0 (05/03/2007)  Problem # 3:  CONGESTIVE HEART FAILURE (ICD-428.0) sxs are controlled continue current medications  Her updated medication list for this problem includes:    Metoprolol Tartrate 50 Mg Tabs (Metoprolol tartrate) .Marland Kitchen... Take 1 tablet by mouthevery morning and 1/2 every evening    Micardis 80 Mg Tabs (Telmisartan) .Marland Kitchen... Take 1 tablet by mouth once a day  Echocardiogram: trace mitral regurgitation. Mild tricuspid regurgitation. Otherwise normal (08/14/2000)  Complete Medication List: 1)  Amaryl 4 Mg Tabs (Glimepiride) .... Take 1 tablet by mouth once a day 2)  Diazepam 10 Mg Tabs (Diazepam) .... 1/2 two times a day and  hs as needed--must last 30 days or more 3)  Metoprolol Tartrate 50 Mg Tabs (Metoprolol tartrate) .... Take 1 tablet by mouthevery morning and 1/2 every evening 4)  Micardis 80 Mg Tabs (Telmisartan) .... Take 1 tablet by mouth once a day 5)  Onetouch Test Strp (Glucose blood) .... Test daily as directed 6)  Zoloft  50 Mg Tabs (Sertraline hcl) .... One by mouth daily 7)  Klor-con M20 20 Meq Tbcr (Potassium chloride crys cr) .... Once daily 8)  Tikosyn 250 Mcg Caps (Dofetilide) .... Take 1 capsule by mouth every 12 hours 9)  Levbid 0.375 Mg Tb12 (Hyoscyamine sulfate) .... One twice daily 10)  Amitiza 24 Mcg Caps (Lubiprostone) .... One by mouth bid 11)  Levothroid 75 Mcg Tabs (Levothyroxine sodium) .... Take 1 tablet by mouth once a day 12)  Meclizine Hcl 25 Mg Tabs (Meclizine hcl) .Marland Kitchen.. 1 by mouth two times a day as needed vertigo 13)  Hyomax-sr 0.375 Mg Xr12h-tab (Hyoscyamine sulfate) .... Take 1 tablet by mouth two times a day 14)  Tramadol Hcl 50 Mg Tabs (Tramadol hcl) .... One to two tablets by mouth q 6 hours as needed pain 15)  Tylenol Extra Strength 500 Mg Tabs (Acetaminophen) .... As needed 16)  Gas-x Extra Strength 125 Mg Caps (Simethicone) .... As needed 17)  Onetouch Ultra System  W/device Kit (Blood glucose monitoring suppl) .... Use once daily as directed 18)  Vitamin D 1000 Unit Tabs (Cholecalciferol) .... Once daily  Patient Instructions: 1)  Please schedule a follow-up appointment in 2 months. 2)  labs one week prior to visit 3)  lipids---272.4 4)  lfts-995.2 5)  bmet-995.2 6)  A1C-250.02 7)

## 2010-06-21 NOTE — Assessment & Plan Note (Signed)
Summary: 2 month rov /njr   Vital Signs:  Patient profile:   77 year old female Weight:      172 pounds Temp:     98.5 degrees F oral Pulse rate:   76 / minute Pulse rhythm:   regular Resp:     12 per minute BP sitting:   96 / 68  (left arm) Cuff size:   regular  Vitals Entered By: Gladis Riffle, RN (November 08, 2009 10:41 AM) CC: 2 month rov--c/o diverticulitis last week Is Patient Diabetic? Yes Did you bring your meter with you today? No Comments does not check CBGs at home, friend will instruct today on use   Primary Care Provider:  Birdie Sons MD  CC:  2 month rov--c/o diverticulitis last week.  History of Present Illness:  Follow-Up Visit      This is an 77 year old woman who presents for Follow-up visit.  The patient denies chest pain and palpitations.  Since the last visit the patient notes no new problems or concerns.  The patient reports taking meds as prescribed.  When questioned about possible medication side effects, the patient notes none. Reviewed dr Vena Rua note. she says abdominal pain is better Pt and family member state that she has chronic abdominal pain---pt states that pain is relieved with hyoscyamine. pt and her family member disagree about how successful the pain relief is. pt states the medication works well.  All other systems reviewed and were negative  -- chronic back pain---family member complains about how disabling the back pain is---pt states that her back pain is doing well on current meds  Preventive Screening-Counseling & Management  Alcohol-Tobacco     Smoking Status: never  Current Problems (verified): 1)  Abdominal Pain, Left Lower Quadrant  (ICD-789.04) 2)  Back Pain  (ICD-724.5) 3)  Pacemaker, Permanent  (ICD-V45.01) 4)  Osteoporosis  (ICD-733.00) 5)  Hyperlipidemia  (ICD-272.4) 6)  Tremor, Essential  (ICD-333.1) 7)  Congestive Heart Failure  (ICD-428.0) 8)  Diabetic Peripheral Neuropathy  (ICD-250.60) 9)  Hypothyroidism   (ICD-244.9) 10)  Hypertension  (ICD-401.9) 11)  Diverticulosis, Colon  (ICD-562.10) 12)  Diabetes Mellitus, Type II  (ICD-250.00) 13)  Atrial Fibrillation  (ICD-427.31) 14)  Anxiety  (ICD-300.00)  Current Medications (verified): 1)  Metoprolol Tartrate 50 Mg Tabs (Metoprolol Tartrate) .... Take 1 Tablet By Mouth Every Morning and 1/2 Every Evening 2)  Micardis 80 Mg Tabs (Telmisartan) .... Take 1 Tablet By Mouth Once A Day 3)  Onetouch Test  Strp (Glucose Blood) .... Test Daily As Directed 4)  Zoloft 50 Mg Tabs (Sertraline Hcl) .... One By Mouth Daily 5)  Tikosyn 250 Mcg  Caps (Dofetilide) .... Take 1 Capsule By Mouth Every 12 Hours 6)  Amitiza 24 Mcg  Caps (Lubiprostone) .... One By Mouth Bid 7)  Levothroid 75 Mcg Tabs (Levothyroxine Sodium) .... Take 1 Tablet By Mouth Once A Day 8)  Hyomax-Sr 0.375 Mg Xr12h-Tab (Hyoscyamine Sulfate) .... Take 1 Tablet By Mouth Two Times A Day 9)  Tylenol Extra Strength 500 Mg Tabs (Acetaminophen) .... As Needed 10)  Gas-X Extra Strength 125 Mg Caps (Simethicone) .... As Needed 11)  Onetouch Ultra System W/device Kit (Blood Glucose Monitoring Suppl) .... Use Once Daily As Directed 12)  Vitamin D3 2000 Unit Caps (Cholecalciferol) .... Qd 13)  Metronidazole 500 Mg Tabs (Metronidazole) .Marland Kitchen.. 1 Tab By Mouth Three Times A Day X 10d 14)  Glimepiride 4 Mg Tabs (Glimepiride) .Marland Kitchen.. 1 Tab By Mouth Qd  Allergies:  1)  ! Codeine Sulfate (Codeine Sulfate) 2)  ! Simvastatin (Simvastatin)  Past History:  Past Medical History: Last updated: 09/04/2008 chronic AC BACK PAIN (ICD-724.5) OSTEOPOROSIS (ICD-733.00) HYPERLIPIDEMIA (ICD-272.4) OTHER AND UNSPECIFIED HYPERLIPIDEMIA (ICD-272.4) TREMOR, ESSENTIAL (ICD-333.1) EAR PAIN, LEFT (ICD-388.70) CONGESTIVE HEART FAILURE (ICD-428.0) ANTICOAGULATION THERAPY (ICD-V58.61) DIABETIC PERIPHERAL NEUROPATHY (ICD-250.60) HX, PERSONAL, HEALTH HAZARDS NEC (ICD-V15.89) HYPOTHYROIDISM (ICD-244.9) HYPERTENSION  (ICD-401.9) DIVERTICULOSIS, COLON (ICD-562.10) DIABETES MELLITUS, TYPE II (ICD-250.00) ATRIAL FIBRILLATION (ICD-427.31) ANXIETY (ICD-300.00)    Past Surgical History: Last updated: 01/02/2007 Cataract extraction  2002 PPM L3 compression fx  Family History: Last updated: 07-08-2007 father deceased MI age 54 mother deceased 59 yo---unknown cause (sever essential tremor)  Social History: Last updated: 09/04/2008 Single Never Smoked Regular exercise-no Retired  Alcohol Use - no Drug Use - no  Risk Factors: Exercise: no (02/13/2007)  Risk Factors: Smoking Status: never (11/08/2009)  Physical Exam  General:  Well-developed,well-nourished,in no acute distress; alert,appropriate and cooperative throughout examination Head:  normocephalic and atraumatic.  normocephalic and atraumatic.   Eyes:  pupils equal and pupils round.  pupils equal and pupils round.   Ears:  R ear normal and L ear normal.  R ear normal and L ear normal.   Neck:  No deformities, masses, or tenderness noted. Chest Wall:  No deformities, masses, or tenderness noted. Lungs:  normal respiratory effort and no intercostal retractions.  normal respiratory effort and no intercostal retractions.   Heart:  Normal rate and regular rhythm. S1 and S2 normal without gallop, murmur, click, rub or other extra sounds. Abdomen:  no distention, no masses, no guarding, and no rebound tenderness.  Tender with palpation in LLQ Skin:  turgor normal and color normal.  turgor normal and color normal.   Psych:  normally interactive and good eye contact.  normally interactive and good eye contact.     Impression & Recommendations:  Problem # 1:  ABDOMINAL PAIN, LEFT LOWER QUADRANT (ICD-789.04)  she is doing wel on current meds  Her updated medication list for this problem includes:    Amitiza 24 Mcg Caps (Lubiprostone) ..... One by mouth bid    Gas-x Extra Strength 125 Mg Caps (Simethicone) .Marland Kitchen... As  needed  Orders: Gastroenterology Referral (GI)  Problem # 2:  BACK PAIN (ICD-724.5)  Her updated medication list for this problem includes:    Tylenol Extra Strength 500 Mg Tabs (Acetaminophen) .Marland Kitchen... As needed  Problem # 3:  HYPERLIPIDEMIA (ICD-272.4) refuses medications Labs Reviewed: SGOT: 18 (11/01/2009)   SGPT: 16 (11/01/2009)   HDL:30.40 (11/01/2009), 34.60 (07/02/2009)  LDL:DEL (03/17/2008), DEL (12/09/2007)  Chol:201 (11/01/2009), 216 (07/02/2009)  Trig:267.0 (11/01/2009), 437.0 (07/02/2009)  Problem # 4:  ATRIAL FIBRILLATION (ICD-427.31) continue current medications  she has regular f/u with cardiology Her updated medication list for this problem includes:    Metoprolol Tartrate 50 Mg Tabs (Metoprolol tartrate) .Marland Kitchen... Take 1 tablet by mouth every morning and 1/2 every evening    Tikosyn 250 Mcg Caps (Dofetilide) .Marland Kitchen... Take 1 capsule by mouth every 12 hours  Complete Medication List: 1)  Metoprolol Tartrate 50 Mg Tabs (Metoprolol tartrate) .... Take 1 tablet by mouth every morning and 1/2 every evening 2)  Micardis 80 Mg Tabs (Telmisartan) .... Take 1 tablet by mouth once a day 3)  Onetouch Test Strp (Glucose blood) .... Test daily as directed 4)  Zoloft 50 Mg Tabs (Sertraline hcl) .... One by mouth daily 5)  Tikosyn 250 Mcg Caps (Dofetilide) .... Take 1 capsule by mouth every 12 hours 6)  Amitiza 24  Mcg Caps (Lubiprostone) .... One by mouth bid 7)  Levothroid 75 Mcg Tabs (Levothyroxine sodium) .... Take 1 tablet by mouth once a day 8)  Hyomax-sr 0.375 Mg Xr12h-tab (Hyoscyamine sulfate) .... Take 1 tablet by mouth two times a day 9)  Tylenol Extra Strength 500 Mg Tabs (Acetaminophen) .... As needed 10)  Gas-x Extra Strength 125 Mg Caps (Simethicone) .... As needed 11)  Onetouch Ultra System W/device Kit (Blood glucose monitoring suppl) .... Use once daily as directed 12)  Vitamin D3 2000 Unit Caps (Cholecalciferol) .... Qd 13)  Glimepiride 4 Mg Tabs (Glimepiride) .Marland Kitchen.. 1 tab  by mouth qd 14)  Diazepam 2 Mg Tabs (Diazepam) .... 1/2-1 by mouth at bedtime as needed  Patient Instructions: 1)  Please schedule a follow-up appointment in 3 months.

## 2010-06-21 NOTE — Progress Notes (Signed)
Summary: REQ FOR APPT / FOLLOW-UP  Phone Note Call from Patient   Caller: Patient  [phone] Reason for Call: Talk to Nurse, Talk to Doctor Summary of Call: Pt called in for appt but Dr Cato Mulligan is out of the office for this week.... Per Alvino Chapel, RN pt can be scheduled when Dr Cato Mulligan returns next week... Pt is coming in for follow-up on chronic back pain.... Pt was scheduled for next Friday, September 03, 2009 at 1:00pm with Dr Cato Mulligan.  Attempted contacting pt to advise appt date/time.... Phone continuously rang - no a/m.  WIll continue trying to make contact with pt to advise appt date and time........  [phone]  Initial call taken by: Debbra Riding,  August 26, 2009 8:55 AM  Follow-up for Phone Call        Phone Call Completed-----Made contact with pt at [phone].... Pt was advised of appt on Friday, 09/03/2009 at 1pm with Dr Cato Mulligan.... Pt acknowledged same.  Follow-up by: Debbra Riding,  August 26, 2009 10:26 AM

## 2010-06-21 NOTE — Assessment & Plan Note (Signed)
Summary: 3 MTH ROV // RS rsc bmp/njr   Vital Signs:  Patient profile:   77 year old female Weight:      173 pounds Temp:     99 degrees F oral Pulse rate:   78 / minute BP sitting:   120 / 72  Vitals Entered By: Lynann Beaver CMA (February 15, 2010 10:43 AM) Is Patient Diabetic? Yes Pain Assessment Patient in pain? no        Primary Care Provider:  Birdie Sons MD   History of Present Illness:  Follow-Up Visit      This is an 77 year old woman who presents for Follow-up visit.  The patient denies chest pain and palpitations.  Since the last visit the patient notes no new problems or concerns.  The patient reports taking meds as prescribed.  When questioned about possible medication side effects, the patient notes none.  Chronic left flank/upper back pain---evaluated by orthopedist---duration years. She thinks worse since "beraking my back 4-5 years ago"  All other systems reviewed and were negative except for chronic back pain    Current Problems (verified): 1)  Hearing Loss  (ICD-389.9) 2)  Back Pain  (ICD-724.5) 3)  Pacemaker, Permanent  (ICD-V45.01) 4)  Osteoporosis  (ICD-733.00) 5)  Hyperlipidemia  (ICD-272.4) 6)  Tremor, Essential  (ICD-333.1) 7)  Congestive Heart Failure  (ICD-428.0) 8)  Diabetic Peripheral Neuropathy  (ICD-250.60) 9)  Hypothyroidism  (ICD-244.9) 10)  Hypertension  (ICD-401.9) 11)  Diverticulosis, Colon  (ICD-562.10) 12)  Diabetes Mellitus, Type II  (ICD-250.00) 13)  Atrial Fibrillation  (ICD-427.31) 14)  Anxiety  (ICD-300.00)  Current Medications (verified): 1)  Metoprolol Tartrate 50 Mg Tabs (Metoprolol Tartrate) .... Take 1 Tablet By Mouth Every Morning and 1/2 Every Evening 2)  Micardis 80 Mg Tabs (Telmisartan) .... Take 1 Tablet By Mouth Once A Day 3)  Onetouch Test  Strp (Glucose Blood) .... Test Daily As Directed 4)  Zoloft 50 Mg Tabs (Sertraline Hcl) .... One By Mouth Daily 5)  Tikosyn 250 Mcg  Caps (Dofetilide) .... Take 1 Capsule  By Mouth Every 12 Hours 6)  Amitiza 24 Mcg  Caps (Lubiprostone) .... One By Mouth Bid 7)  Levothroid 75 Mcg Tabs (Levothyroxine Sodium) .... Take 1 Tablet By Mouth Once A Day 8)  Hyomax-Sr 0.375 Mg Xr12h-Tab (Hyoscyamine Sulfate) .... Take 1 Tablet By Mouth Two Times A Day 9)  Tylenol Extra Strength 500 Mg Tabs (Acetaminophen) .... As Needed 10)  Gas-X Extra Strength 125 Mg Caps (Simethicone) .... As Needed 11)  Onetouch Ultra System W/device Kit (Blood Glucose Monitoring Suppl) .... Use Once Daily As Directed 12)  Vitamin D3 2000 Unit Caps (Cholecalciferol) .... Qd 13)  Glimepiride 4 Mg Tabs (Glimepiride) .Marland Kitchen.. 1 Tab By Mouth Qd 14)  Diazepam 2 Mg  Tabs (Diazepam) .... 1/2-1 By Mouth At Bedtime As Needed 15)  Ferrous Sulfate 325 (65 Fe) Mg Tabs (Ferrous Sulfate) .... Once Daily 16)  Aspirin 325 Mg Tabs (Aspirin) .... Once Daily 17)  Miralax   Powd (Polyethylene Glycol 3350) .... Take As Directed At Bedtime  Allergies (verified): 1)  ! Codeine Sulfate (Codeine Sulfate) 2)  ! Simvastatin (Simvastatin)  Past History:  Past Medical History: Last updated: 09/04/2008 chronic AC BACK PAIN (ICD-724.5) OSTEOPOROSIS (ICD-733.00) HYPERLIPIDEMIA (ICD-272.4) OTHER AND UNSPECIFIED HYPERLIPIDEMIA (ICD-272.4) TREMOR, ESSENTIAL (ICD-333.1) EAR PAIN, LEFT (ICD-388.70) CONGESTIVE HEART FAILURE (ICD-428.0) ANTICOAGULATION THERAPY (ICD-V58.61) DIABETIC PERIPHERAL NEUROPATHY (ICD-250.60) HX, PERSONAL, HEALTH HAZARDS NEC (ICD-V15.89) HYPOTHYROIDISM (ICD-244.9) HYPERTENSION (ICD-401.9) DIVERTICULOSIS, COLON (ICD-562.10) DIABETES MELLITUS,  TYPE II (ICD-250.00) ATRIAL FIBRILLATION (ICD-427.31) ANXIETY (ICD-300.00)    Past Surgical History: Last updated: 01/02/2007 Cataract extraction  2002 PPM L3 compression fx  Family History: Last updated: 12-27-2009 father deceased MI age 49 mother deceased 46 yo---unknown cause (sever essential tremor) No FH of Colon Cancer:  Social History: Last  updated: 2009/12/27 Retired  widowed  Never Smoked Regular exercise-no Alcohol Use - no Drug Use - no  Risk Factors: Exercise: no (02/13/2007)  Risk Factors: Smoking Status: never (11/08/2009)  Physical Exam  General:  alert and well-developed.   Head:  normocephalic and atraumatic.   Eyes:  pupils equal and pupils round.   Neck:  No deformities, masses, or tenderness noted. Lungs:  normal respiratory effort and no intercostal retractions.   Heart:  normal rate and regular rhythm.   Abdomen:  no distention, no masses, no guarding, and no rebound tenderness.  Tender with palpation in LLQ Skin:  turgor normal and color normal.   Psych:  good eye contact and not anxious appearing.     Impression & Recommendations:  Problem # 1:  BACK PAIN (ICD-724.5) stable will not change meds  Her updated medication list for this problem includes:    Tylenol Extra Strength 500 Mg Tabs (Acetaminophen) .Marland Kitchen... As needed    Aspirin 325 Mg Tabs (Aspirin) ..... Once daily  Orders: T-Lumbar Spine Complete, 5 Views [phone]) Physical Therapy Referral (PT)  Problem # 2:  HYPERLIPIDEMIA (ICD-272.4) controlled continue current medications  Labs Reviewed: SGOT: 18 (11/01/2009)   SGPT: 16 (11/01/2009)   HDL:30.40 (11/01/2009), 34.60 (07/02/2009)  LDL:DEL (03/17/2008), DEL (12/09/2007)  Chol:201 (11/01/2009), 216 (07/02/2009)  Trig:267.0 (11/01/2009), 437.0 (07/02/2009)  Orders: Venipuncture (66063) Specimen Handling (01601) TLB-Lipid Panel (80061-LIPID) TLB-Hepatic/Liver Function Pnl (80076-HEPATIC)  Problem # 3:  ATRIAL FIBRILLATION (ICD-427.31)  Her updated medication list for this problem includes:    Metoprolol Tartrate 50 Mg Tabs (Metoprolol tartrate) .Marland Kitchen... Take 1 tablet by mouth every morning and 1/2 every evening    Tikosyn 250 Mcg Caps (Dofetilide) .Marland Kitchen... Take 1 capsule by mouth every 12 hours    Aspirin 325 Mg Tabs (Aspirin) ..... Once daily  Problem # 4:  DIABETIC PERIPHERAL  NEUROPATHY (ICD-250.60)  check labs today Her updated medication list for this problem includes:    Micardis 80 Mg Tabs (Telmisartan) .Marland Kitchen... Take 1 tablet by mouth once a day    Glimepiride 4 Mg Tabs (Glimepiride) .Marland Kitchen... 1 tab by mouth qd    Aspirin 325 Mg Tabs (Aspirin) ..... Once daily  Labs Reviewed: Creat: 1.4 (11/01/2009)     Last Eye Exam: normal-pt's report (02/19/2009) Reviewed HgBA1c results: 6.3 (11/01/2009)  7.8 (07/02/2009)  Orders: Specimen Handling (09323)  Complete Medication List: 1)  Metoprolol Tartrate 50 Mg Tabs (Metoprolol tartrate) .... Take 1 tablet by mouth every morning and 1/2 every evening 2)  Micardis 80 Mg Tabs (Telmisartan) .... Take 1 tablet by mouth once a day 3)  Onetouch Test Strp (Glucose blood) .... Test daily as directed 4)  Zoloft 50 Mg Tabs (Sertraline hcl) .... One by mouth daily 5)  Tikosyn 250 Mcg Caps (Dofetilide) .... Take 1 capsule by mouth every 12 hours 6)  Amitiza 24 Mcg Caps (Lubiprostone) .... One by mouth bid 7)  Levothroid 75 Mcg Tabs (Levothyroxine sodium) .... Take 1 tablet by mouth once a day 8)  Hyomax-sr 0.375 Mg Xr12h-tab (Hyoscyamine sulfate) .... Take 1 tablet by mouth two times a day 9)  Tylenol Extra Strength 500 Mg Tabs (Acetaminophen) .... As needed 10)  Gas-x Extra Strength 125 Mg Caps (Simethicone) .... As needed 11)  Onetouch Ultra System W/device Kit (Blood glucose monitoring suppl) .... Use once daily as directed 12)  Vitamin D3 2000 Unit Caps (Cholecalciferol) .... Qd 13)  Glimepiride 4 Mg Tabs (Glimepiride) .Marland Kitchen.. 1 tab by mouth qd 14)  Diazepam 2 Mg Tabs (Diazepam) .... 1/2-1 by mouth at bedtime as needed 15)  Ferrous Sulfate 325 (65 Fe) Mg Tabs (Ferrous sulfate) .... Once daily 16)  Aspirin 325 Mg Tabs (Aspirin) .... Once daily 17)  Miralax Powd (Polyethylene glycol 3350) .... Take as directed at bedtime  Other Orders: Radiology Referral (Radiology) TLB-TSH (Thyroid Stimulating Hormone) (84443-TSH) TLB-BMP  (Basic Metabolic Panel-BMET) (80048-METABOL) TLB-A1C / Hgb A1C (Glycohemoglobin) (83036-A1C)

## 2010-06-21 NOTE — Progress Notes (Signed)
Summary: diazepam refill  Phone Note Refill Request Message from:  Fax from Pharmacy on November 30, 2009 5:18 PM  Refills Requested: Medication #1:  DIAZEPAM 2 MG  TABS 1/2-1 by mouth at bedtime as needed. Initial call taken by: Kern Reap CMA Duncan Dull),  November 30, 2009 5:18 PM    Prescriptions: DIAZEPAM 2 MG  TABS (DIAZEPAM) 1/2-1 by mouth at bedtime as needed  #30 x 0   Entered by:   Kern Reap CMA (AAMA)   Authorized by:   Birdie Sons MD   Signed by:   Kern Reap CMA (AAMA) on 11/30/2009   Method used:   Telephoned to ...       [facility]  [address] [place]. [phone]* (retail)       [phone] [address]       [place], [place]  [address]       Ph: [phone] or [phone]       Fax: [phone]   RxID:   [id number]

## 2010-06-21 NOTE — Assessment & Plan Note (Signed)
Summary: eph/jml  Medications Added FERROUS SULFATE 325 (65 FE) MG TABS (FERROUS SULFATE) once daily ASPIRIN 325 MG TABS (ASPIRIN) once daily      Allergies Added:   Visit Type:  Follow-up Primary Provider:  Birdie Sons MD   History of Present Illness: Ms. [name] returns today for followup of her pacemaker and atrial fibrillation.  She has a h/o persistent atrial fibrillation but has done quite well in the past several years on Tikosyn.  She does not feel palpitations when she goes into atrial fibrillation as she has undergone AV node ablation in the past by Dr. Severiano Gilbert.  She has a resting tremor which has been stable and appears better today.  She denies peripheral edema or worsening CHF symptoms.  No other complaints today.  Current Medications (verified): 1)  Metoprolol Tartrate 50 Mg Tabs (Metoprolol Tartrate) .... Take 1 Tablet By Mouth Every Morning and 1/2 Every Evening 2)  Micardis 80 Mg Tabs (Telmisartan) .... Take 1 Tablet By Mouth Once A Day 3)  Onetouch Test  Strp (Glucose Blood) .... Test Daily As Directed 4)  Zoloft 50 Mg Tabs (Sertraline Hcl) .... One By Mouth Daily 5)  Tikosyn 250 Mcg  Caps (Dofetilide) .... Take 1 Capsule By Mouth Every 12 Hours 6)  Amitiza 24 Mcg  Caps (Lubiprostone) .... One By Mouth Bid 7)  Levothroid 75 Mcg Tabs (Levothyroxine Sodium) .... Take 1 Tablet By Mouth Once A Day 8)  Hyomax-Sr 0.375 Mg Xr12h-Tab (Hyoscyamine Sulfate) .... Take 1 Tablet By Mouth Two Times A Day 9)  Tylenol Extra Strength 500 Mg Tabs (Acetaminophen) .... As Needed 10)  Gas-X Extra Strength 125 Mg Caps (Simethicone) .... As Needed 11)  Onetouch Ultra System W/device Kit (Blood Glucose Monitoring Suppl) .... Use Once Daily As Directed 12)  Vitamin D3 2000 Unit Caps (Cholecalciferol) .... Qd 13)  Glimepiride 4 Mg Tabs (Glimepiride) .Marland Kitchen.. 1 Tab By Mouth Qd 14)  Diazepam 2 Mg  Tabs (Diazepam) .... 1/2-1 By Mouth At Bedtime As Needed 15)  Ferrous Sulfate 325 (65 Fe) Mg Tabs  (Ferrous Sulfate) .... Once Daily 16)  Aspirin 325 Mg Tabs (Aspirin) .... Once Daily  Allergies (verified): 1)  ! Codeine Sulfate (Codeine Sulfate) 2)  ! Simvastatin (Simvastatin)  Past History:  Past Medical History: Last updated: 09/04/2008 chronic AC BACK PAIN (ICD-724.5) OSTEOPOROSIS (ICD-733.00) HYPERLIPIDEMIA (ICD-272.4) OTHER AND UNSPECIFIED HYPERLIPIDEMIA (ICD-272.4) TREMOR, ESSENTIAL (ICD-333.1) EAR PAIN, LEFT (ICD-388.70) CONGESTIVE HEART FAILURE (ICD-428.0) ANTICOAGULATION THERAPY (ICD-V58.61) DIABETIC PERIPHERAL NEUROPATHY (ICD-250.60) HX, PERSONAL, HEALTH HAZARDS NEC (ICD-V15.89) HYPOTHYROIDISM (ICD-244.9) HYPERTENSION (ICD-401.9) DIVERTICULOSIS, COLON (ICD-562.10) DIABETES MELLITUS, TYPE II (ICD-250.00) ATRIAL FIBRILLATION (ICD-427.31) ANXIETY (ICD-300.00)    Past Surgical History: Last updated: 01/02/2007 Cataract extraction  2002 PPM L3 compression fx  Review of Systems       The patient complains of weight loss.  The patient denies chest pain, syncope, dyspnea on exertion, and peripheral edema.    Vital Signs:  Patient profile:   77 year old female Height:      66 inches Weight:      169 pounds Pulse rate:   77 / minute BP sitting:   100 / 62  (left arm)  Vitals Entered By: Laurance Flatten CMA (November 23, 2009 1:33 PM)  Physical Exam  General:  Well-developed,well-nourished,in no acute distress; alert,appropriate and cooperative throughout examination Head:  normocephalic and atraumatic.  normocephalic and atraumatic.   Eyes:  pupils equal and pupils round.  pupils equal and pupils round.   Mouth:  Oral  mucosa and oropharynx without lesions or exudates.  Teeth in good repair. Neck:  No deformities, masses, or tenderness noted. Chest Wall:  well healed PPM incision. Lungs:  normal respiratory effort and no intercostal retractions.  normal respiratory effort and no intercostal retractions.   Heart:  Normal rate and regular rhythm. S1 and S2 normal  without gallop, murmur, click, rub or other extra sounds. Abdomen:  no distention, no masses, no guarding, and no rebound tenderness.  Tender with palpation in LLQ Msk:  no localized reproducible back tenderness. Pulses:  pulses normal in all 4 extremities Extremities:  No clubbing, cyanosis, edema, or deformity noted with normal full range of motion of all joints.   Neurologic:  resting tremor, A and O times 3.   PPM Specifications Following MD:  Lewayne Bunting, MD     PPM Vendor:  [employer]     PPM Model Number:  [id number]     PPM Serial Number:  [id number] PPM DOI:  04/28/2002     PPM Implanting MD:  Lewayne Bunting, MD  Lead 1    Location: RA     DOI: 04/28/2002     Model #: [id number]     Serial #: [id number]     Status: active Lead 2    Location: RV     DOI: 04/28/2002     Model #: [id number]     Serial #: [id number]     Status: active  Magnet Response Rate:  BOL 85 ERI  65  Indications:  A-fib   PPM Follow Up Remote Check?  No Battery Voltage:  2.67 V     Battery Est. Longevity:  10 months     Pacer Dependent:  No       PPM Device Measurements Atrium  Impedance: 424 ohms, Threshold: 0.5 V at 0.4 msec Right Ventricle  Amplitude: 2.8 mV, Impedance: 633 ohms, Threshold: 0.75 V at 0.4 msec  Episodes MS Episodes:  98     Percent Mode Switch:  9.7%     Coumadin:  No Atrial Pacing:  87.5%     Ventricular Pacing:  63%  Parameters Mode:  DDDR     Lower Rate Limit:  75     Upper Rate Limit:  130 Paced AV Delay:  250     Sensed AV Delay:  250 Tech Comments:  No parameter changes.  Device checked by industry.  ROV 6 months clinic. Altha Harm, LPN  November 23, 6385 2:10 PM  MD Comments:  Normal device function.  Approaching ERI.  Impression & Recommendations:  Problem # 1:  PACEMAKER, PERMANENT (ICD-V45.01) Her device is working normally.  We will recheck in several months.  Problem # 2:  CONGESTIVE HEART FAILURE (ICD-428.0) Her symptoms remain class 2.  Continue meds as below and maintain a low  sodium diet. Her updated medication list for this problem includes:    Metoprolol Tartrate 50 Mg Tabs (Metoprolol tartrate) .Marland Kitchen... Take 1 tablet by mouth every morning and 1/2 every evening    Micardis 80 Mg Tabs (Telmisartan) .Marland Kitchen... Take 1 tablet by mouth once a day    Tikosyn 250 Mcg Caps (Dofetilide) .Marland Kitchen... Take 1 capsule by mouth every 12 hours    Aspirin 325 Mg Tabs (Aspirin) ..... Once daily  Problem # 3:  HYPERTENSION (ICD-401.9)  Her pressures have been fairly well controlled.   A low sodium diet is requested. Her updated medication list for this problem includes:    Metoprolol Tartrate 50 Mg Tabs (Metoprolol  tartrate) .Marland Kitchen... Take 1 tablet by mouth every morning and 1/2 every evening    Micardis 80 Mg Tabs (Telmisartan) .Marland Kitchen... Take 1 tablet by mouth once a day    Aspirin 325 Mg Tabs (Aspirin) ..... Once daily  Her updated medication list for this problem includes:    Metoprolol Tartrate 50 Mg Tabs (Metoprolol tartrate) .Marland Kitchen... Take 1 tablet by mouth every morning and 1/2 every evening    Micardis 80 Mg Tabs (Telmisartan) .Marland Kitchen... Take 1 tablet by mouth once a day    Aspirin 325 Mg Tabs (Aspirin) ..... Once daily  Patient Instructions: 1)  Your physician recommends that you schedule a follow-up appointment in: 6 months pacer clinic with Gunnar Fusi 2)  Your physician wants you to follow-up in: 12 months with Dr.Taylor You will receive a reminder letter in the mail two months in advance. If you don't receive a letter, please call our office to schedule the follow-up appointment.

## 2010-06-21 NOTE — Progress Notes (Signed)
Summary: only 15 diazepam not 30  Phone Note Call from Patient Call back at Home Phone [phone]   Caller: Patient Call For: Birdie Sons MD Summary of Call: pt stated she only received 15 diazepam pills [facility] call [facility] [address] [phone] Initial call taken by: Heron Sabins,  December 06, 2009 12:27 PM  Follow-up for Phone Call        #30 telephoned on 11/30/09.  Called pharmacy and left message to verify #30. Follow-up by: Gladis Riffle, RN,  December 06, 2009 12:48 PM

## 2010-06-21 NOTE — Progress Notes (Signed)
Summary: REQ FOR INFO TO OBTAIN MED REFILL  Phone Note Call from Patient   Caller: Patient [phone] Reason for Call: Refill Medication, Talk to Nurse, Talk to Doctor Summary of Call: Pt called to adv that she went to the pharmacy to get refills on her meds and they advised her that she was in the "donut hole""..... Pt adv that she is not able to get any of her meds.Marland KitchenMarland KitchenMarland KitchenPt adv she will have to go to bank to see amt she has in her checking acct to see if she even has enough to get medication...... Unsure of any assistance that she may be eligible for?  Pt req that if there are any way meds are available (samples, etc.) or change in prescriptions or other ways that she can obtain the following meds:  Zoloft 50 Mg Tabs Metoprolol Tartrate 50 Mg Tabs  Hyomax-Sr 0.375 Mg Xr12h-Tab  Amitiza 24 Mcg  Caps Levothroid 75 Mcg Tabs  Diazepam 10 Mg Tabs  Tramadol Hcl 50 Mg Tabs  Pt can be reached at [phone]...Marland KitchenMarland Kitchen Pt also req that her med list be printed out and mailed to her if possible.  Initial call taken by: Debbra Riding,  July 12, 2009 10:57 AM  Follow-up for Phone Call        these should all be available for 4.00 on most pharmacy lists---she may have to "shop around".  amitiza will be expenxive.  unusual that she is in donut hole in february Follow-up by: Birdie Sons MD,  July 12, 2009 12:58 PM  Additional Follow-up for Phone Call Additional follow up Details #1::        Patient notified. She will call by phone to see where can get best deal. Additional Follow-up by: Gladis Riffle, RN,  July 12, 2009 2:54 PM

## 2010-06-21 NOTE — Assessment & Plan Note (Signed)
Summary: follow up from vit d/consult re: pain in back/cjr   Vital Signs:  Patient profile:   77 year old female Weight:      179 pounds BMI:     29.00 Temp:     98.6 degrees F Pulse rate:   74 / minute Pulse rhythm:   regular Resp:     16 per minute BP sitting:   114 / 80  (left arm)  Vitals Entered By: Gladis Riffle, RN (July 02, 2009 1:12 PM) CC: FU Vitamin D, has completed 50000U--c/o back pain on lower left Is Patient Diabetic? Yes Did you bring your meter with you today? No   CC:  FU Vitamin D and has completed 50000U--c/o back pain on lower left.  History of Present Illness:  Follow-Up Visit      This is an 77 year old woman who presents for Follow-up visit.  The patient denies chest pain and palpitations.  Since the last visit the patient notes no new problems or concerns.  The patient reports taking meds as prescribed.  When questioned about possible medication side effects, the patient notes none.   All other systems reviewed and were negative   Preventive Screening-Counseling & Management  Alcohol-Tobacco     Smoking Status: never  Current Problems (verified): 1)  Pacemaker, Permanent  (ICD-V45.01) 2)  Osteoporosis  (ICD-733.00) 3)  Hyperlipidemia  (ICD-272.4) 4)  Tremor, Essential  (ICD-333.1) 5)  Congestive Heart Failure  (ICD-428.0) 6)  Diabetic Peripheral Neuropathy  (ICD-250.60) 7)  Hypothyroidism  (ICD-244.9) 8)  Hypertension  (ICD-401.9) 9)  Diverticulosis, Colon  (ICD-562.10) 10)  Diabetes Mellitus, Type II  (ICD-250.00) 11)  Atrial Fibrillation  (ICD-427.31) 12)  Anxiety  (ICD-300.00)  Current Medications (verified): 1)  Amaryl 4 Mg Tabs (Glimepiride) .... Take 1 Tablet By Mouth Once A Day 2)  Diazepam 10 Mg Tabs (Diazepam) .... 1/2 Two Times A Day and  Hs As Needed 3)  Metoprolol Tartrate 50 Mg Tabs (Metoprolol Tartrate) .... Take 1 Tablet By [facility] Morning and 1/2 Every Evening 4)  Micardis 80 Mg Tabs (Telmisartan) .... Take 1 Tablet By  Mouth Once A Day 5)  Onetouch Test  Strp (Glucose Blood) .... Test Daily 6)  Zoloft 50 Mg Tabs (Sertraline Hcl) .... One By Mouth Daily 7)  Klor-Con M20 20 Meq  Tbcr (Potassium Chloride Crys Cr) .... Once Daily 8)  Tikosyn 250 Mcg  Caps (Dofetilide) .... Take 1 Capsule By Mouth Every 12 Hours 9)  Levbid 0.375 Mg  Tb12 (Hyoscyamine Sulfate) .... One Twice Daily 10)  Amitiza 24 Mcg  Caps (Lubiprostone) .... One By Mouth Bid 11)  Levothroid 75 Mcg Tabs (Levothyroxine Sodium) .... Take 1 Tablet By Mouth Once A Day 12)  Meclizine Hcl 25 Mg Tabs (Meclizine Hcl) .Marland Kitchen.. 1 By Mouth Two Times A Day As Needed Vertigo 13)  Darvocet-N 100 100-650 Mg Tabs (Propoxyphene N-Apap) .... Take 1 Tablet By Mouth Two Times A Day As Needed 14)  Hyomax-Sr 0.375 Mg Xr12h-Tab (Hyoscyamine Sulfate) .... Take 1 Tablet By Mouth Two Times A Day 15)  Tramadol Hcl 50 Mg Tabs (Tramadol Hcl) .... One To Two Tablets By Mouth Q 6 Hours As Needed Pain 16)  Tylenol Extra Strength 500 Mg Tabs (Acetaminophen) .... As Needed 17)  Gas-X Extra Strength 125 Mg Caps (Simethicone) .... As Needed  Allergies: 1)  ! Codeine Sulfate (Codeine Sulfate) 2)  ! Simvastatin (Simvastatin)  Past History:  Past Medical History: Last updated: 09/04/2008 chronic AC BACK PAIN (  ICD-724.5) OSTEOPOROSIS (ICD-733.00) HYPERLIPIDEMIA (ICD-272.4) OTHER AND UNSPECIFIED HYPERLIPIDEMIA (ICD-272.4) TREMOR, ESSENTIAL (ICD-333.1) EAR PAIN, LEFT (ICD-388.70) CONGESTIVE HEART FAILURE (ICD-428.0) ANTICOAGULATION THERAPY (ICD-V58.61) DIABETIC PERIPHERAL NEUROPATHY (ICD-250.60) HX, PERSONAL, HEALTH HAZARDS NEC (ICD-V15.89) HYPOTHYROIDISM (ICD-244.9) HYPERTENSION (ICD-401.9) DIVERTICULOSIS, COLON (ICD-562.10) DIABETES MELLITUS, TYPE II (ICD-250.00) ATRIAL FIBRILLATION (ICD-427.31) ANXIETY (ICD-300.00)    Past Surgical History: Last updated: 01/02/2007 Cataract extraction  2002 PPM L3 compression fx  Family History: Last updated: Jul 06, 2007 father  deceased MI age 8 mother deceased 6 yo---unknown cause (sever essential tremor)  Social History: Last updated: 09/04/2008 Single Never Smoked Regular exercise-no Retired  Alcohol Use - no Drug Use - no  Risk Factors: Exercise: no (02/13/2007)  Risk Factors: Smoking Status: never (07/02/2009)  Review of Systems       All other systems reviewed and were negative   Physical Exam  General:  Well-developed,well-nourished,in no acute distress; alert,appropriate and cooperative throughout examination Head:  normocephalic and atraumatic.   Eyes:  pupils equal and pupils round.   Ears:  R ear normal and L ear normal.   Neck:  No deformities, masses, or tenderness noted. Chest Wall:  No deformities, masses, or tenderness noted. Lungs:  normal respiratory effort and no intercostal retractions.   Heart:  normal rate and regular rhythm.   Abdomen:  soft and non-tender.   Msk:  no localized reproducible back tenderness. Neurologic:  cranial nerves II-XII intact and gait normal.   intentional tremor Skin:  turgor normal and color normal.   Psych:  good eye contact and not anxious appearing.     Impression & Recommendations:  Problem # 1:  HYPERLIPIDEMIA (ICD-272.4) check labs today Labs Reviewed: SGOT: 16 (08/05/2008)   SGPT: 17 (03/11/2009)   HDL:27.70 (03/11/2009), 27.70 (08/05/2008)  LDL:DEL (03/17/2008), DEL (12/09/2007)  Chol:208 (03/11/2009), 220 (08/05/2008)  Trig:352.0 (03/11/2009), 402.0 (08/05/2008)  Orders: Venipuncture ([address]) TLB-Lipid Panel (80061-LIPID) TLB-Hepatic/Liver Function Pnl (80076-HEPATIC) TLB-TSH (Thyroid Stimulating Hormone) (84443-TSH)  Problem # 2:  ATRIAL FIBRILLATION (ICD-427.31)  not a warfarin candidate Her updated medication list for this problem includes:    Metoprolol Tartrate 50 Mg Tabs (Metoprolol tartrate) .Marland Kitchen... Take 1 tablet by mouthevery morning and 1/2 every evening    Tikosyn 250 Mcg Caps (Dofetilide) .Marland Kitchen... Take 1 capsule by  mouth every 12 hours  Reviewed the following: PT: 12.1 (05/03/2007)   INR: 1.0 (05/03/2007)  Orders: TLB-CBC Platelet - w/Differential (85025-CBCD)  Problem # 3:  DIABETES MELLITUS, TYPE II (ICD-250.00) check labs today Her updated medication list for this problem includes:    Amaryl 4 Mg Tabs (Glimepiride) .Marland Kitchen... Take 1 tablet by mouth once a day    Micardis 80 Mg Tabs (Telmisartan) .Marland Kitchen... Take 1 tablet by mouth once a day  Labs Reviewed: Creat: 1.4 (03/11/2009)     Last Eye Exam: normal-pt's report (02/19/2009) Reviewed HgBA1c results: 7.1 (03/11/2009)  7.1 (08/05/2008)  Orders: TLB-A1C / Hgb A1C (Glycohemoglobin) (83036-A1C)  Problem # 4:  DYSURIA (ICD-788.1) send for culture  Orders: T-Culture, Urine ([id number])  Complete Medication List: 1)  Amaryl 4 Mg Tabs (Glimepiride) .... Take 1 tablet by mouth once a day 2)  Diazepam 10 Mg Tabs (Diazepam) .... 1/2 two times a day and  hs as needed 3)  Metoprolol Tartrate 50 Mg Tabs (Metoprolol tartrate) .... Take 1 tablet by mouthevery morning and 1/2 every evening 4)  Micardis 80 Mg Tabs (Telmisartan) .... Take 1 tablet by mouth once a day 5)  Onetouch Test Strp (Glucose blood) .... Test daily 6)  Zoloft 50 Mg Tabs (Sertraline  hcl) .... One by mouth daily 7)  Klor-con M20 20 Meq Tbcr (Potassium chloride crys cr) .... Once daily 8)  Tikosyn 250 Mcg Caps (Dofetilide) .... Take 1 capsule by mouth every 12 hours 9)  Levbid 0.375 Mg Tb12 (Hyoscyamine sulfate) .... One twice daily 10)  Amitiza 24 Mcg Caps (Lubiprostone) .... One by mouth bid 11)  Levothroid 75 Mcg Tabs (Levothyroxine sodium) .... Take 1 tablet by mouth once a day 12)  Meclizine Hcl 25 Mg Tabs (Meclizine hcl) .Marland Kitchen.. 1 by mouth two times a day as needed vertigo 13)  Darvocet-n 100 100-650 Mg Tabs (Propoxyphene n-apap) .... Take 1 tablet by mouth two times a day as needed 14)  Hyomax-sr 0.375 Mg Xr12h-tab (Hyoscyamine sulfate) .... Take 1 tablet by mouth two times a  day 15)  Tramadol Hcl 50 Mg Tabs (Tramadol hcl) .... One to two tablets by mouth q 6 hours as needed pain 16)  Tylenol Extra Strength 500 Mg Tabs (Acetaminophen) .... As needed 17)  Gas-x Extra Strength 125 Mg Caps (Simethicone) .... As needed  Other Orders: T-2 View CXR (71020TC) TLB-BMP (Basic Metabolic Panel-BMET) (80048-METABOL)  Laboratory Results   Urine Tests    Routine Urinalysis   Color: yellow Appearance: Clear Glucose: negative   (Normal Range: Negative) Bilirubin: negative   (Normal Range: Negative) Ketone: negative   (Normal Range: Negative) Spec. Gravity: 1.010   (Normal Range: 1.003-1.035) Blood: trace-lysed   (Normal Range: Negative) Protein: negative   (Normal Range: Negative) Urobilinogen: 0.2   (Normal Range: 0-1) Nitrite: negative   (Normal Range: Negative) Leukocyte Esterace: 2+   (Normal Range: Negative)    Comments: Rita Ohara  July 02, 2009 12:36 PM

## 2010-06-21 NOTE — Progress Notes (Signed)
Summary: PHONE CALL CONCERNING X-RAY  Phone Note Call from Patient   Caller: Patient [phone] Reason for Call: Talk to Nurse, Talk to Doctor Summary of Call: Pt adv that she had a paper that Dr Cato Mulligan had given her w/ order for x-ray (pt c/o back pain)..... Pt lost the paper and wanted to know if she could still go over to [facility] for X-Ray of her back...Marland KitchenMarland Kitchen Pt was instructed that Dr Cato Mulligan had ordered a T-2 View CXR at her last appt so she should be fine to go to [place] for same.... Pt acknowledged same.  Initial call taken by: Debbra Riding,  July 21, 2009 11:39 AM     Appended Document: PHONE CALL CONCERNING X-RAY Pt called in to adv that she is still exp back pain.... made appt for OV w/ Dr Cato Mulligan for Friday, 07/23/2009 @ 11:15am.

## 2010-06-21 NOTE — Medication Information (Signed)
Summary: Order for Diabetic Supplies-Ammended  Order for Diabetic Supplies-Ammended   Imported By: Maryln Gottron 03/17/2010 11:24:32  _____________________________________________________________________  External Attachment:    Type:   Image     Comment:   External Document

## 2010-06-21 NOTE — Medication Information (Signed)
Summary: Order for Diabetic Testing Supplies  Order for Diabetic Testing Supplies   Imported By: Maryln Gottron 04/26/2010 13:42:13  _____________________________________________________________________  External Attachment:    Type:   Image     Comment:   External Document

## 2010-06-21 NOTE — Cardiovascular Report (Signed)
Summary: Office Visit   Office Visit   Imported By: Roderic Ovens 10/08/2009 16:28:51  _____________________________________________________________________  External Attachment:    Type:   Image     Comment:   External Document

## 2010-06-21 NOTE — Letter (Signed)
Summary: Appointment - Reschedule  [facility], Main Office  [address]   [place], [place] [address]   Phone: [phone]  Fax: [phone]     Oct 12, 2009 MRN: [id number]   [name] [address] [place], [place]  [address]   Dear Ms. Easterday,   Due to a change in our office schedule, your appointment on 11/23/09 at 3:15 must be changed.  It is very important that we reach you to reschedule this appointment. We look forward to participating in your health care needs. Please contact us at the number listed above at your earliest convenience to reschedule this appointment.     Sincerely,   Ruel Favors Scheduling Team

## 2010-06-21 NOTE — Progress Notes (Signed)
Summary: Pt req refillBP med Metoprolol, HCTZ, and med for Diverticulitis  Phone Note Call from Patient Call back at Home Phone [phone]   Caller: Patient Summary of Call: Pt called and said that she is out of BP med Metoprolol, HCTZ, and med for Diverticulitis. [facility]. Also req pain med for back.  Initial call taken by: Lucy Antigua,  Oct 19, 2009 12:12 PM  Follow-up for Phone Call        Metoprolol refilled by cardio today.  Not on HCTZ.  Needs micardis now.  See Rx.  Has refills for hyomax so is to call pharmacy for that. Follow-up by: Gladis Riffle, RN,  October 20, 2009 4:13 PM    Prescriptions: MICARDIS 80 MG TABS (TELMISARTAN) Take 1 tablet by mouth once a day  #30 Tablet x 5   Entered by:   Gladis Riffle, RN   Authorized by:   Birdie Sons MD   Signed by:   Gladis Riffle, RN on 10/20/2009   Method used:   [facility]ronically to        [facility] [phone]* (retail)       [address]       [place], [place]  [address]       Ph: [phone] or [phone]       Fax: [phone]   RxID:   [id number]

## 2010-06-23 NOTE — Letter (Signed)
Summary: Appointment - Missed  [facility], Main Office  [address]   [place], [place] [address]   Phone: [phone]  Fax: [phone]     May 30, 2010 MRN: [id number]   [name] [address] [place], [place]  [address]   Dear Ms. Lieske,  Our records indicate you missed your appointment on 05-25-2009  with [facility].  It is very important that we reach you to reschedule this appointment. We look forward to participating in your health care needs. Please contact us at the number listed above at your earliest convenience to reschedule this appointment.     Sincerely,    Glass blower/designer

## 2010-06-23 NOTE — Procedures (Signed)
Summary: device ck/mt      Allergies Added:   Current Medications (verified): 1)  Micardis 80 Mg Tabs (Telmisartan) .... Take 1 Tablet By Mouth Once A Day 2)  Onetouch Test  Strp (Glucose Blood) .... Test Daily As Directed 3)  Zoloft 50 Mg Tabs (Sertraline Hcl) .... One By Mouth Daily 4)  Amitiza 24 Mcg  Caps (Lubiprostone) .... One By Mouth Bid 5)  Levothroid 75 Mcg Tabs (Levothyroxine Sodium) .... Take 1 Tablet By Mouth Once A Day 6)  Hyomax-Sr 0.375 Mg Xr12h-Tab (Hyoscyamine Sulfate) .... Take 1 Tablet By Mouth Two Times A Day 7)  Tylenol Extra Strength 500 Mg Tabs (Acetaminophen) .... As Needed 8)  Gas-X Extra Strength 125 Mg Caps (Simethicone) .... As Needed 9)  Onetouch Ultra System W/device Kit (Blood Glucose Monitoring Suppl) .... Use Once Daily As Directed 10)  Vitamin D3 2000 Unit Caps (Cholecalciferol) .... Qd 11)  Glimepiride 4 Mg Tabs (Glimepiride) .Marland Kitchen.. 1 Tab By Mouth Qd 12)  Diazepam 2 Mg  Tabs (Diazepam) .... 1/2-1 By Mouth At Bedtime As Needed 13)  Ferrous Sulfate 325 (65 Fe) Mg Tabs (Ferrous Sulfate) .... Once Daily 14)  Aspirin 325 Mg Tabs (Aspirin) .... Once Daily 15)  Miralax   Powd (Polyethylene Glycol 3350) .... Take As Directed At Bedtime 16)  Klor-Con M10 10 Meq Cr-Tabs (Potassium Chloride Crys Cr) .Marland Kitchen.. 1 By Mouth Once Daily 17)  Diltiazem Hcl Er Beads 300 Mg Xr24h-Cap (Diltiazem Hcl Er Beads) .Marland Kitchen.. 1 By Mouth Once Daily 18)  Digoxin 0.125 Mg Tabs (Digoxin) .Marland Kitchen.. 1 By Mouth Once Daily  Allergies (verified): 1)  ! Codeine Sulfate (Codeine Sulfate) 2)  ! Simvastatin (Simvastatin)  PPM Specifications Following MD:  Lewayne Bunting, MD     PPM Vendor:  [employer]     PPM Model Number:  [id number]     PPM Serial Number:  [id number] PPM DOI:  04/28/2002     PPM Implanting MD:  Lewayne Bunting, MD  Lead 1    Location: RA     DOI: 04/28/2002     Model #: [id number]     Serial #: [id number]     Status: active Lead 2    Location: RV     DOI: 04/28/2002     Model #: [id number]     Serial #:  [id number]     Status: active  Magnet Response Rate:  BOL 85 ERI  65  Indications:  A-fib   PPM Follow Up Remote Check?  No Battery Voltage:  2.60 V     Battery Est. Longevity:  <1 months     Pacer Dependent:  No       PPM Device Measurements Atrium  Amplitude: 0.7 mV, Impedance: 433 ohms,  Right Ventricle  Amplitude: 2.0 mV, Impedance: 653 ohms, Threshold: 0.25 V at 0.4 msec  Episodes Coumadin:  No  Parameters Mode:  DDDR     Lower Rate Limit:  75     Upper Rate Limit:  130 Paced AV Delay:  250     Sensed AV Delay:  250 Tech Comments:  No parameter changes.  A-fib with RVR, - coumadin.  To be set up for generator change out and A-V node ablation. Altha Harm, LPN  June 09, 2010 1:10 PM  MD Comments:  Her device is at [facility]. Her ventricular rate is increased. Will plan AV node ablation at the time of her generator change.

## 2010-06-23 NOTE — Assessment & Plan Note (Signed)
Summary: post hosp/dm   Vital Signs:  Patient profile:   77 year old female Weight:      170 pounds Temp:     97.9 degrees F oral Pulse rate:   76 / minute Pulse rhythm:   regular BP sitting:   94 / 66  (left arm) Cuff size:   regular  Vitals Entered By: Alfred Levins, CMA (June 07, 2010 12:10 PM) CC: hosp f/u   Primary Care Provider:  Birdie Sons MD  CC:  hosp f/u.  History of Present Illness:  Follow-Up Visit      This is an 77 year old woman who presents for Follow-up visit.  The patient denies chest pain, palpitations, edema, and SOB.  Since the last visit the patient notes a recent hospitilization.  The patient reports taking meds as prescribed.  When questioned about possible medication side effects, the patient notes none.  reviewed hospital note...she has not had any recurrent chest pain. no other concerns  All other systems reviewed and were negative   Current Problems (verified): 1)  Preventive Health Care  (ICD-V70.0) 2)  Hearing Loss  (ICD-389.9) 3)  Back Pain  (ICD-724.5) 4)  Pacemaker, Permanent  (ICD-V45.01) 5)  Osteoporosis  (ICD-733.00) 6)  Hyperlipidemia  (ICD-272.4) 7)  Tremor, Essential  (ICD-333.1) 8)  Congestive Heart Failure  (ICD-428.0) 9)  Diabetic Peripheral Neuropathy  (ICD-250.60) 10)  Hypothyroidism  (ICD-244.9) 11)  Hypertension  (ICD-401.9) 12)  Diverticulosis, Colon  (ICD-562.10) 13)  Diabetes Mellitus, Type II  (ICD-250.00) 14)  Atrial Fibrillation  (ICD-427.31) 15)  Anxiety  (ICD-300.00)  Current Medications (verified): 1)  Micardis 80 Mg Tabs (Telmisartan) .... Take 1 Tablet By Mouth Once A Day 2)  Onetouch Test  Strp (Glucose Blood) .... Test Daily As Directed 3)  Zoloft 50 Mg Tabs (Sertraline Hcl) .... One By Mouth Daily 4)  Amitiza 24 Mcg  Caps (Lubiprostone) .... One By Mouth Bid 5)  Levothroid 75 Mcg Tabs (Levothyroxine Sodium) .... Take 1 Tablet By Mouth Once A Day 6)  Hyomax-Sr 0.375 Mg Xr12h-Tab (Hyoscyamine Sulfate)  .... Take 1 Tablet By Mouth Two Times A Day 7)  Tylenol Extra Strength 500 Mg Tabs (Acetaminophen) .... As Needed 8)  Gas-X Extra Strength 125 Mg Caps (Simethicone) .... As Needed 9)  Onetouch Ultra System W/device Kit (Blood Glucose Monitoring Suppl) .... Use Once Daily As Directed 10)  Vitamin D3 2000 Unit Caps (Cholecalciferol) .... Qd 11)  Glimepiride 4 Mg Tabs (Glimepiride) .Marland Kitchen.. 1 Tab By Mouth Qd 12)  Diazepam 2 Mg  Tabs (Diazepam) .... 1/2-1 By Mouth At Bedtime As Needed 13)  Ferrous Sulfate 325 (65 Fe) Mg Tabs (Ferrous Sulfate) .... Once Daily 14)  Aspirin 325 Mg Tabs (Aspirin) .... Once Daily 15)  Miralax   Powd (Polyethylene Glycol 3350) .... Take As Directed At Bedtime 16)  Klor-Con M10 10 Meq Cr-Tabs (Potassium Chloride Crys Cr) .Marland Kitchen.. 1 By Mouth Once Daily 17)  Diltiazem Hcl Er Beads 300 Mg Xr24h-Cap (Diltiazem Hcl Er Beads) .Marland Kitchen.. 1 By Mouth Once Daily 18)  Digoxin 0.125 Mg Tabs (Digoxin) .Marland Kitchen.. 1 By Mouth Once Daily  Allergies (verified): 1)  ! Codeine Sulfate (Codeine Sulfate) 2)  ! Simvastatin (Simvastatin)  Past History:  Past Medical History: Last updated: 09/04/2008 chronic AC BACK PAIN (ICD-724.5) OSTEOPOROSIS (ICD-733.00) HYPERLIPIDEMIA (ICD-272.4) OTHER AND UNSPECIFIED HYPERLIPIDEMIA (ICD-272.4) TREMOR, ESSENTIAL (ICD-333.1) EAR PAIN, LEFT (ICD-388.70) CONGESTIVE HEART FAILURE (ICD-428.0) ANTICOAGULATION THERAPY (ICD-V58.61) DIABETIC PERIPHERAL NEUROPATHY (ICD-250.60) HX, PERSONAL, HEALTH HAZARDS NEC (ICD-V15.89) HYPOTHYROIDISM (ICD-244.9)  HYPERTENSION (ICD-401.9) DIVERTICULOSIS, COLON (ICD-562.10) DIABETES MELLITUS, TYPE II (ICD-250.00) ATRIAL FIBRILLATION (ICD-427.31) ANXIETY (ICD-300.00)    Past Surgical History: Last updated: 01/02/2007 Cataract extraction  2002 PPM L3 compression fx  Family History: Last updated: 12/18/2009 father deceased MI age 44 mother deceased 33 yo---unknown cause (sever essential tremor) No FH of Colon Cancer:  Social  History: Last updated: 2009/12/18 Retired  widowed  Never Smoked Regular exercise-no Alcohol Use - no Drug Use - no  Risk Factors: Exercise: no (02/13/2007)  Risk Factors: Smoking Status: never (11/08/2009)  Physical Exam  General:  alert and well-developed.   Head:  normocephalic.   Nose:  no external deformity and no external erythema.   Neck:  No deformities, masses, or tenderness noted. Chest Wall:  No deformities, masses, or tenderness noted. Lungs:  normal respiratory effort and no intercostal retractions.   Heart:  normal rate and irregular rhythm.   Abdomen:  soft and non-tender.   Skin:  turgor normal and color normal.   Psych:  normally interactive and good eye contact.     Impression & Recommendations:  Problem # 1:  ATRIAL FIBRILLATION (ICD-427.31)  see new medication list she is not a warfarin candidate Her updated medication list for this problem includes:    Aspirin 325 Mg Tabs (Aspirin) ..... Once daily    Diltiazem Hcl Er Beads 300 Mg Xr24h-cap (Diltiazem hcl er beads) .Marland Kitchen... 1 by mouth once daily    Digoxin 0.125 Mg Tabs (Digoxin) .Marland Kitchen... 1 by mouth once daily  Reviewed the following: PT: 12.1 (05/03/2007)   INR: 1.0 (05/03/2007)  Problem # 2:  HYPERLIPIDEMIA (ICD-272.4) refuses treatment Labs Reviewed: SGOT: 18 (02/15/2010)   SGPT: 17 (02/15/2010)   HDL:32.00 (02/15/2010), 30.40 (11/01/2009)  LDL:DEL (03/17/2008), DEL (12/09/2007)  Chol:222 (02/15/2010), 201 (11/01/2009)  Trig:360.0 (02/15/2010), 267.0 (11/01/2009)  Problem # 3:  DIABETES MELLITUS, TYPE II (ICD-250.00)  check labs today Her updated medication list for this problem includes:    Micardis 80 Mg Tabs (Telmisartan) .Marland Kitchen... Take 1 tablet by mouth once a day    Glimepiride 4 Mg Tabs (Glimepiride) .Marland Kitchen... 1 tab by mouth qd    Aspirin 325 Mg Tabs (Aspirin) ..... Once daily  Labs Reviewed: Creat: 1.5 (02/15/2010)     Last Eye Exam: normal-pt's report (02/19/2009) Reviewed HgBA1c results:  6.5 (02/15/2010)  6.3 (11/01/2009)  Orders: Venipuncture ([address]) TLB-A1C / Hgb A1C (Glycohemoglobin) (83036-A1C) reviewed hospital records---note pneurmonia---no recurrence  Complete Medication List: 1)  Micardis 80 Mg Tabs (Telmisartan) .... Take 1 tablet by mouth once a day 2)  Onetouch Test Strp (Glucose blood) .... Test daily as directed 3)  Zoloft 50 Mg Tabs (Sertraline hcl) .... One by mouth daily 4)  Amitiza 24 Mcg Caps (Lubiprostone) .... One by mouth bid 5)  Levothroid 75 Mcg Tabs (Levothyroxine sodium) .... Take 1 tablet by mouth once a day 6)  Hyomax-sr 0.375 Mg Xr12h-tab (Hyoscyamine sulfate) .... Take 1 tablet by mouth two times a day 7)  Tylenol Extra Strength 500 Mg Tabs (Acetaminophen) .... As needed 8)  Gas-x Extra Strength 125 Mg Caps (Simethicone) .... As needed 9)  Onetouch Ultra System W/device Kit (Blood glucose monitoring suppl) .... Use once daily as directed 10)  Vitamin D3 2000 Unit Caps (Cholecalciferol) .... Qd 11)  Glimepiride 4 Mg Tabs (Glimepiride) .Marland Kitchen.. 1 tab by mouth qd 12)  Diazepam 2 Mg Tabs (Diazepam) .... 1/2-1 by mouth at bedtime as needed 13)  Ferrous Sulfate 325 (65 Fe) Mg Tabs (Ferrous sulfate) .... Once daily  14)  Aspirin 325 Mg Tabs (Aspirin) .... Once daily 15)  Miralax Powd (Polyethylene glycol 3350) .... Take as directed at bedtime 16)  Klor-con M10 10 Meq Cr-tabs (Potassium chloride crys cr) .Marland Kitchen.. 1 by mouth once daily 17)  Diltiazem Hcl Er Beads 300 Mg Xr24h-cap (Diltiazem hcl er beads) .Marland Kitchen.. 1 by mouth once daily 18)  Digoxin 0.125 Mg Tabs (Digoxin) .Marland Kitchen.. 1 by mouth once daily  Other Orders: Specimen Handling ([address]) TLB-TSH (Thyroid Stimulating Hormone) (84443-TSH) TLB-BMP (Basic Metabolic Panel-BMET) (80048-METABOL)  Contraindications/Deferment of Procedures/Staging:    Treatment: Statin    Contraindication: adverse rxn/side effects     Test/Procedure: Statin Declined    Reason for deferment: patient declined   Patient  Instructions: 1)  Please schedule a follow-up appointment in 3 months.   Orders Added: 1)  Venipuncture [60454] 2)  Specimen Handling [99000] 3)  TLB-A1C / Hgb A1C (Glycohemoglobin) [83036-A1C] 4)  TLB-TSH (Thyroid Stimulating Hormone) [84443-TSH] 5)  TLB-BMP (Basic Metabolic Panel-BMET) [80048-METABOL] 6)  Est. Patient Level IV [09811]

## 2010-06-23 NOTE — Progress Notes (Signed)
Summary: Verbal Order-[facility]  Phone Note Call from Patient   Caller: Judeth Cornfield  Call For: [phone] Summary of Call: Needs verbal order to see pt for one last visit  on next week.  Ok to leave message. Initial call taken by: Trixie Dredge,  June 09, 2010 12:08 PM  Follow-up for Phone Call        ok Follow-up by: Birdie Sons MD,  June 10, 2010 6:55 AM  Additional Follow-up for Phone Call Additional follow up Details #1::        left verbal order on Stephanie's voicemail Additional Follow-up by: Alfred Levins, CMA,  June 10, 2010 11:23 AM

## 2010-06-23 NOTE — Letter (Signed)
Summary: Implantable Device Instructions  Architectural technologist, Main Office  [address]   [place], [place] [address]   Phone: [phone]  Fax: [phone]      Implantable Device Instructions  You are scheduled for:  _____ Generator Change  on _____ with Dr. _____.  1.  Please arrive at the Short Stay Center at [facility] at 7:30am on the day of your procedure.  2.  Do not eat or drink after midnight the night before your procedure.  3.  Complete lab work on  06/17/10 at 12:00pm.  .  You do not have to be fasting.  4.  Do NOT take these medications for the morning of  your procedure:  Glimepiride  5.  Plan for an overnight stay.  Bring your insurance cards and a list of your medications.  6.  Wash your chest and neck with antibacterial soap (any brand) the evening before and the morning of your procedure.  Rinse well.   *If you have ANY questions after you get home, please call the office [phone]. Anselm Pancoast  *Every attempt is made to prevent procedures from being rescheduled.  Due to the nauture of Electrophysiology, rescheduling can happen.  The physician is always aware and directs the staff when this occurs.    Appended Document: Implantable Device Instructions 06/24/2010 with Dr Ladona Ridgel

## 2010-06-24 ENCOUNTER — Observation Stay (HOSPITAL_COMMUNITY)
Admission: RE | Admit: 2010-06-24 | Discharge: 2010-06-29 | Disposition: A | Discharge status: Home / Self Care | Payer: Medicare Other | Source: Ambulatory Visit | Attending: Internal Medicine | Admitting: Internal Medicine

## 2010-06-24 ENCOUNTER — Encounter: Payer: Self-pay | Admitting: Internal Medicine

## 2010-06-24 DIAGNOSIS — Z8673 Personal history of transient ischemic attack (TIA), and cerebral infarction without residual deficits: Secondary | ICD-10-CM | POA: Insufficient documentation

## 2010-06-24 DIAGNOSIS — Y840 Cardiac catheterization as the cause of abnormal reaction of the patient, or of later complication, without mention of misadventure at the time of the procedure: Secondary | ICD-10-CM | POA: Insufficient documentation

## 2010-06-24 DIAGNOSIS — Z45018 Encounter for adjustment and management of other part of cardiac pacemaker: Principal | ICD-10-CM | POA: Insufficient documentation

## 2010-06-24 DIAGNOSIS — E119 Type 2 diabetes mellitus without complications: Secondary | ICD-10-CM | POA: Insufficient documentation

## 2010-06-24 DIAGNOSIS — I4891 Unspecified atrial fibrillation: Secondary | ICD-10-CM

## 2010-06-24 DIAGNOSIS — N183 Chronic kidney disease, stage 3 unspecified: Secondary | ICD-10-CM | POA: Insufficient documentation

## 2010-06-24 DIAGNOSIS — I129 Hypertensive chronic kidney disease with stage 1 through stage 4 chronic kidney disease, or unspecified chronic kidney disease: Secondary | ICD-10-CM | POA: Insufficient documentation

## 2010-06-24 DIAGNOSIS — I509 Heart failure, unspecified: Secondary | ICD-10-CM | POA: Insufficient documentation

## 2010-06-24 DIAGNOSIS — F411 Generalized anxiety disorder: Secondary | ICD-10-CM | POA: Insufficient documentation

## 2010-06-24 DIAGNOSIS — E039 Hypothyroidism, unspecified: Secondary | ICD-10-CM | POA: Insufficient documentation

## 2010-06-24 DIAGNOSIS — IMO0002 Reserved for concepts with insufficient information to code with codable children: Secondary | ICD-10-CM | POA: Insufficient documentation

## 2010-06-24 DIAGNOSIS — I5032 Chronic diastolic (congestive) heart failure: Secondary | ICD-10-CM | POA: Insufficient documentation

## 2010-06-24 DIAGNOSIS — I959 Hypotension, unspecified: Secondary | ICD-10-CM | POA: Insufficient documentation

## 2010-06-24 DIAGNOSIS — Y9210 Unspecified residential institution as the place of occurrence of the external cause: Secondary | ICD-10-CM | POA: Insufficient documentation

## 2010-06-24 DIAGNOSIS — R079 Chest pain, unspecified: Secondary | ICD-10-CM | POA: Insufficient documentation

## 2010-06-24 DIAGNOSIS — M810 Age-related osteoporosis without current pathological fracture: Secondary | ICD-10-CM | POA: Insufficient documentation

## 2010-06-24 LAB — GLUCOSE, CAPILLARY: Glucose-Capillary: 186 mg/dL — ABNORMAL HIGH (ref 70–99)

## 2010-06-24 LAB — POCT ACTIVATED CLOTTING TIME: Activated Clotting Time: 176 seconds

## 2010-06-25 ENCOUNTER — Observation Stay (HOSPITAL_COMMUNITY): Payer: Medicare Other

## 2010-06-25 LAB — GLUCOSE, CAPILLARY: Glucose-Capillary: 179 mg/dL — ABNORMAL HIGH (ref 70–99)

## 2010-06-25 LAB — CBC
HCT: 31.2 % — ABNORMAL LOW (ref 36.0–46.0)
Hemoglobin: 10.3 g/dL — ABNORMAL LOW (ref 12.0–15.0)
MCV: 92 fL (ref 78.0–100.0)
Platelets: 74 10*3/uL — ABNORMAL LOW (ref 150–400)
RBC: 3.39 MIL/uL — ABNORMAL LOW (ref 3.87–5.11)
WBC: 4.7 10*3/uL (ref 4.0–10.5)

## 2010-06-26 ENCOUNTER — Observation Stay (HOSPITAL_COMMUNITY): Payer: Medicare Other

## 2010-06-26 DIAGNOSIS — M79609 Pain in unspecified limb: Secondary | ICD-10-CM

## 2010-06-26 LAB — CBC
HCT: 30.6 % — ABNORMAL LOW (ref 36.0–46.0)
RDW: 14.7 % (ref 11.5–15.5)
WBC: 5.6 10*3/uL (ref 4.0–10.5)

## 2010-06-26 LAB — BASIC METABOLIC PANEL
GFR calc non Af Amer: 43 mL/min — ABNORMAL LOW (ref >60)
Glucose, Bld: 217 mg/dL — ABNORMAL HIGH (ref 70–99)
Potassium: 3.8 mEq/L (ref 3.5–5.1)
Sodium: 136 mEq/L (ref 135–145)

## 2010-06-26 LAB — GLUCOSE, CAPILLARY
Glucose-Capillary: 182 mg/dL — ABNORMAL HIGH (ref 70–99)
Glucose-Capillary: 170 mg/dL — ABNORMAL HIGH (ref 70–99)

## 2010-06-27 LAB — CBC
HCT: 29.3 % — ABNORMAL LOW (ref 36.0–46.0)
MCHC: 32.4 g/dL (ref 30.0–36.0)
MCV: 93.9 fL (ref 78.0–100.0)
RDW: 15 % (ref 11.5–15.5)

## 2010-06-28 ENCOUNTER — Other Ambulatory Visit: Payer: MEDICARE

## 2010-06-28 LAB — DIFFERENTIAL
Lymphs Abs: 1.8 10*3/uL (ref 0.7–4.0)
Monocytes Relative: 8 % (ref 3–12)
Neutro Abs: 3.3 10*3/uL (ref 1.7–7.7)
Neutrophils Relative %: 58 % (ref 43–77)

## 2010-06-28 LAB — GLUCOSE, CAPILLARY: Glucose-Capillary: 129 mg/dL — ABNORMAL HIGH (ref 70–99)

## 2010-06-28 LAB — CBC
Hemoglobin: 10 g/dL — ABNORMAL LOW (ref 12.0–15.0)
MCH: 30.5 pg (ref 26.0–34.0)
MCV: 93.3 fL (ref 78.0–100.0)
RBC: 3.28 MIL/uL — ABNORMAL LOW (ref 3.87–5.11)

## 2010-06-29 LAB — GLUCOSE, CAPILLARY: Glucose-Capillary: 175 mg/dL — ABNORMAL HIGH (ref 70–99)

## 2010-06-29 NOTE — Miscellaneous (Signed)
Summary: Physician's Orders/[facility]  Physician's Orders/[facility]   Imported By: Maryln Gottron 06/21/2010 14:27:45  _____________________________________________________________________  External Attachment:    Type:   Image     Comment:   External Document

## 2010-06-29 NOTE — Cardiovascular Report (Signed)
Summary: Office Visit   Office Visit   Imported By: Roderic Ovens 06/22/2010 15:57:34  _____________________________________________________________________  External Attachment:    Type:   Image     Comment:   External Document

## 2010-07-01 ENCOUNTER — Other Ambulatory Visit: Payer: MEDICARE

## 2010-07-01 ENCOUNTER — Encounter: Payer: MEDICARE | Admitting: Physician Assistant

## 2010-07-03 NOTE — Op Note (Signed)
[name], [name]              ACCOUNT NO.:  [id number]  MEDICAL RECORD NO.:  [id number]           PATIENT TYPE:  I  LOCATION:  3711                         FACILITY:  [facility]  PHYSICIAN:  Doylene Canning. Ladona Ridgel, MD    DATE OF BIRTH:  1926/10/10  DATE OF PROCEDURE:  06/24/2010 DATE OF DISCHARGE:                              OPERATIVE REPORT   PROCEDURE PERFORMED:  Removal of a previously implanted dual-chamber pacemaker which needs elective replacement indication followed by insertion of a new dual-chamber device utilizing pacemaker and pocket revision.  Also performed was AV node ablation with mapping of his bundle region.  INDICATIONS:  The patient is an 77 year old woman who underwent AV node ablation and permanent pacemaker many years ago.  Her AV conduction was returned, however.  She developed and she had persistent AFib with a rapid ventricular response.  She had been intolerant amiodarone in the past, and was placed on Tikosyn and maintained sinus rhythm initially very nicely.  However, with worsening renal insufficiency, her Tikosyn had to be stopped.  She has had recurrent and persistent AFib with a rapid ventricular response and is now referred for AV node ablation in conjunction with her pacemaker generator change as her pacemaker has reached elective replacement indication.  PROCEDURE:  After informed consent was obtained, the patient was taken to the diagnostic EP lab in a fasting state.  After usual preparation and draping, intravenous fentanyl and midazolam was given for sedation. 30 mL lidocaine was infiltrated in the left infraclavicular region.  A 5- cm incision was carried out over this region.  Electrocautery was utilized to dissect down to the pacemaker pocket and the generator was removed without difficulty and a new Medtronic Sensia dual-chamber device was inserted.  The pocket was revised to allow for the somewhat larger-sized pacemaker generator.  The atrial  and ventricular leads were evaluated and found to be working satisfactorily.  Sensing however was reduced but chronically saw in the 2-3 range.  Having accomplished this, the pocket was irrigated with gentamicin and the incision was then closed with 2-0 and 3-0 Vicryl.  Benzoin and Steri-Strips were painted on the skin.  A pressure dressing was applied and the patient was prepped for AV node ablation.  After additional lidocaine was infiltrated into the right infraclavicular region, rather the right femoral vein was punctured and a 7-French quadripolar ablation catheter was advanced by way of the right femoral vein under the fluoroscopic guidance into the right atrium.  Mapping was carried out.  The HV interval was 40 milliseconds. The total of 2 RF energy applications were applied to the right atrial/ventricular insertion on the patient's AV node resulting in the creation of complete heart block.  The patient was observed for approximately 25 minutes, and there was no recurrent conduction.  The catheter was removed.  The sheath was removed and at this point, it was noted that the patient's AV conduction returned.  After hemostasis was obtained by way of the right femoral venous sheath and the patient was resedated with fentanyl and Versed, repeat AV node ablation was carried out by way of the left femoral  artery.  The left femoral artery was subsequently punctured and an 8-French sheath was placed into the artery.  A 5000 units of heparin were given and a 7-French quadripolar ablation catheter was then inserted in retrograde across the aortic valve by way of the left femoral artery and into the left ventricle. The ablation catheter was withdrawn backwards just to the junction of the AV node.  A small potential was demonstrated.  A total of 2 RF energy applications were delivered to his bundle region from the left ventricular insertion, resulting in immediate creation of complete  heart block.  At this point, the patient was observed for 45 minutes and had no recurrent AV conduction.  Her pacemaker was reprogramed to a VVIR, rate of 80 beats per minute and she was returned to her room in satisfactory condition.  COMPLICATIONS:  There were no immediate procedure complications.  Results have demonstrated successful removal and insertion of a new Medtronic dual-chamber pacemaker followed by successful AV-node ablation requiring ablation from both the right side as well as the left side of the AV node ultimately resulting in the creation of complete heart block.     Doylene Canning. Ladona Ridgel, MD     GWT/MEDQ  D:  06/24/2010  T:  06/25/2010  Job:  [id number]  cc:   [name], MD  Electronically Signed by Lewayne Bunting MD on 06/30/2010 05:18:53 PM

## 2010-07-04 ENCOUNTER — Telehealth: Payer: Self-pay | Admitting: Internal Medicine

## 2010-07-06 ENCOUNTER — Other Ambulatory Visit: Payer: Self-pay | Admitting: Internal Medicine

## 2010-07-06 ENCOUNTER — Ambulatory Visit (INDEPENDENT_AMBULATORY_CARE_PROVIDER_SITE_OTHER): Payer: Medicare Other

## 2010-07-06 ENCOUNTER — Encounter: Payer: Self-pay | Admitting: Internal Medicine

## 2010-07-06 ENCOUNTER — Encounter (INDEPENDENT_AMBULATORY_CARE_PROVIDER_SITE_OTHER): Payer: Self-pay | Admitting: *Deleted

## 2010-07-06 ENCOUNTER — Other Ambulatory Visit (INDEPENDENT_AMBULATORY_CARE_PROVIDER_SITE_OTHER): Payer: Medicare Other

## 2010-07-06 DIAGNOSIS — I4891 Unspecified atrial fibrillation: Secondary | ICD-10-CM

## 2010-07-06 LAB — BASIC METABOLIC PANEL
BUN: 28 mg/dL — ABNORMAL HIGH (ref 6–23)
Creatinine, Ser: 1.7 mg/dL — ABNORMAL HIGH (ref 0.4–1.2)
GFR: 30.43 mL/min — ABNORMAL LOW (ref >60.00)
Potassium: 4.7 mEq/L (ref 3.5–5.1)

## 2010-07-08 ENCOUNTER — Encounter: Payer: MEDICARE | Admitting: Physician Assistant

## 2010-07-08 ENCOUNTER — Other Ambulatory Visit: Payer: MEDICARE

## 2010-07-11 NOTE — Discharge Summary (Signed)
NAMEFRANKLIN, [name]              ACCOUNT NO.:  [id number]  MEDICAL RECORD NO.:  [id number]           PATIENT TYPE:  I  LOCATION:  3711                         FACILITY:  [facility]  PHYSICIAN:  Doylene Canning. Ladona Ridgel, MD    DATE OF BIRTH:  [birth year]  DATE OF ADMISSION:  06/24/2010 DATE OF DISCHARGE:  06/28/2010                              DISCHARGE SUMMARY   PROCEDURES: 1. Removal of a previously implanted dual-chamber pacemaker at     elective replacement indicator. 2. Insertion of a Medtronic Sensia dual lead device utilizing     pacemaker and pocket revision. 3. Atrioventricular node ablation with mapping of the His bundle     region. 4. Left groin ultrasound. 5. Two-view chest x-ray.  PRIMARY FINAL DISCHARGE DIAGNOSIS:  Persistent atrial fibrillation with rapid ventricular response.  SECONDARY DIAGNOSES: 1. Intolerance to amiodarone and Tikosyn. 2. History of atypical chest pain. 3. History of community-acquired pneumonia. 4. Hypotension. 5. History of diabetes. 6. Renal insufficiency with chronic kidney disease, stage III. 7. Hypothyroidism. 8. History of thrombocytopenia with a platelet count of 81,000 at     discharge. 9. History of iron deficiency. 10.Remote history of atrioventricular node ablation, conduction     returned. 11.History of falls and therefore not a Coumadin candidate. 12.Chronic diastolic congestive heart failure. 13.History of transient ischemic attacks. 14.History of hypertension. 15.History of hyperlipidemia. 16.Peripheral neuropathy. 17.Anxiety. 18.History of diverticulosis. 19.Osteoporosis. 20.Hearing loss. 21.Benign tremor.  TIME AT DISCHARGE:  41 minutes.  HOSPITAL COURSE:  [name] is an 77 year old female with a history of atrial fibrillation.  She had been intolerant to amiodarone in the past and Tikosyn had to be stopped because of worsening renal function. She had also had a previous AV node ablation but conduction  returned. Because her atrial fibrillation rate was poorly controlled and her device generator was at [facility], she was scheduled by Dr. Ladona Ridgel to come to the hospital for an AV node ablation and generator change out.  She came to the hospital for the procedure on June 24, 2010.  [name] had her previous device removed and after pocket revision had a new Medtronic Sensia dual-chamber device inserted.  She then had successful AV node ablation requiring ablation both from the right side as well as from the left side of the AV node, ultimately resulting in the creation of complete heart block.  She tolerated the procedure well.  On June 25, 2010, she had left groin pain and hematoma was noted in her left groin.  Her CBC was followed closely and an ultrasound was performed which showed a large hematoma in the left groin, dimensions not given but no evidence of a pseudoaneurysm or AV fistula noted.  Her hemoglobin prior to the procedure was 12 with a hematocrit of 35.9. Postprocedure hemoglobin was 10.3 with a hematocrit of 31.2.  By discharge, she had remained stable with a hemoglobin of 10 and hematocrit of 30.6.  She has a reported history of iron deficiency but iron studies were not done.  She is to continue taking the iron supplements she was on prior to discharge and follow up with Primary Care.  She had some weakness postprocedure and PT as well as OT were asked to see of her.  She was evaluated and did fairly well.  A caregiver was available to her intermittently but skilled OT services as an outpatient were recommended to increase her independence in ADLs and mobility. Home Health PT and OT are ordered.  [name] was felt to have some deconditioning from this but is expected to improve.  Her strength increased gradually and by June 28, 2010, Dr. Ladona Ridgel felt that she had improved sufficiently to be discharged home, with close outpatient followup.  DISCHARGE  INSTRUCTIONS: 1. Her activity level is to be increased gradually per the discharge     instruction sheet. 2. She is to call our office for problems with the cath site or the     incision. 3. She is not to drive for a week. 4. She is to follow up with Dr. Ladona Ridgel in 3 months and our office will     call. 5. She is to follow up with Tereso Newcomer, [name] for a CBC and a groin     check on July 01, 2010 at 8:15. 6. She is to follow up with Dr. Cato Mulligan as needed.  DISCHARGE MEDICATIONS: 1. Zoloft 50 mg daily. 2. Simethicone daily p.r.n. 3. Meclizine 25 mg b.i.d. p.r.n. 4. Hyoscyamine 0.375 mg b.i.d. 5. Valium 2 mg b.i.d. p.r.n. 6. Metoprolol is discontinued. 7. Diltiazem 300 mg daily. 8. Digoxin 0.125 mg daily. 9. Amitiza 24 mcg b.i.d. p.r.n. 10.Iron 325 mg daily. 11.Sublingual nitroglycerin p.r.n. 12.Vicodin 1-2 tablets q.6 h. p.r.n. 13.Aspirin is changed from 325 to 81 mg daily. 14.Potassium 10 mEq daily. 15.Levalbuterol 1 nebulizer b.i.d. p.r.n. 16.Amaryl 4 mg daily. 17.Levothyroxine 75 mcg daily. 18.Vitamin D3 2000 units daily.     Theodore Demark, PA-C   ______________________________ Doylene Canning. Ladona Ridgel, MD    RB/MEDQ  D:  06/28/2010  T:  06/29/2010  Job:  [id number]  cc:   [name], MD  Electronically Signed by Theodore Demark PA-C on 07/08/2010 12:04:42 PM Electronically Signed by Lewayne Bunting MD on 07/11/2010 03:38:57 PM

## 2010-07-13 NOTE — Progress Notes (Signed)
Summary: NEED ORDER FOR NUSRE TO CHECK MEDS   Phone Note Other Incoming   Caller: [facility] / [facility] [place]/ [phone] Summary of Call: NEED ORDER TO GET A NURSE TO CHECK PT MED PT HAS A LEVEL BETWEEN TWO MEDICATION  POTASSIUM AND HYOSCYAMINE Initial call taken by: Judie Grieve,  July 04, 2010 9:17 AM  Follow-up for Phone Call        Dr Ladona Ridgel aware Patient has been on both of these medications for a while  No need for someone to go and ck her medications  We can go over them at her post hospital appointment. Dennis Bast, RN, BSN  July 04, 2010 10:51 AM

## 2010-07-19 NOTE — Cardiovascular Report (Signed)
Summary: Office Visit   Office Visit   Imported By: Roderic Ovens 07/13/2010 15:53:47  _____________________________________________________________________  External Attachment:    Type:   Image     Comment:   External Document

## 2010-07-19 NOTE — Procedures (Signed)
Summary: pc2/lg...eph/hematoma,anemia/per Bjorn Loser pa/mj      Allergies Added:   Allergies (verified): 1)  ! Codeine Sulfate (Codeine Sulfate) 2)  ! Simvastatin (Simvastatin)  PPM Specifications Following MD:  Lewayne Bunting, MD     PPM Vendor:  Medtronic     PPM Model Number:  [id number]     PPM Serial Number:  [id number] PPM DOI:  06/24/2010     PPM Implanting MD:  Lewayne Bunting, MD  Lead 1    Location: RA     DOI: 04/28/2002     Model #: [id number]     Serial #: [id number]     Status: active Lead 2    Location: RV     DOI: 04/28/2002     Model #: [id number]     Serial #: [id number]     Status: active  Magnet Response Rate:  BOL 85 ERI  65  Indications:  A-fib  Explantation Comments:  06/24/10 Medtronic Kappa [id number] H explanted  PPM Follow Up Pacer Dependent:  No      Episodes Coumadin:  No  Parameters Mode:  DDDR     Lower Rate Limit:  75     Upper Rate Limit:  130 Paced AV Delay:  250     Sensed AV Delay:  250 Tech Comments:  See PaceArt

## 2010-08-08 LAB — CARDIAC PANEL(CRET KIN+CKTOT+MB+TROPI)
CK, MB: 0.8 ng/mL (ref 0.3–4.0)
Total CK: 41 U/L (ref 7–177)
Troponin I: 0.03 ng/mL (ref 0.00–0.06)

## 2010-08-08 LAB — DIFFERENTIAL
Basophils Absolute: 0 10*3/uL (ref 0.0–0.1)
Basophils Relative: 1 % (ref 0–1)
Eosinophils Absolute: 0.1 10*3/uL (ref 0.0–0.7)
Neutrophils Relative %: 54 % (ref 43–77)

## 2010-08-08 LAB — POCT I-STAT, CHEM 8
BUN: 28 mg/dL — ABNORMAL HIGH (ref 6–23)
Hemoglobin: 12.6 g/dL (ref 12.0–15.0)
Potassium: 4.2 mEq/L (ref 3.5–5.1)
Sodium: 139 mEq/L (ref 135–145)
TCO2: 24 mmol/L (ref 0–100)

## 2010-08-08 LAB — COMPREHENSIVE METABOLIC PANEL
AST: 16 U/L (ref 0–37)
Albumin: 3.7 g/dL (ref 3.5–5.2)
Albumin: 4 g/dL (ref 3.5–5.2)
BUN: 30 mg/dL — ABNORMAL HIGH (ref 6–23)
Calcium: 9.2 mg/dL (ref 8.4–10.5)
Chloride: 105 mEq/L (ref 96–112)
Creatinine, Ser: 1.4 mg/dL — ABNORMAL HIGH (ref 0.4–1.2)
Creatinine, Ser: 1.47 mg/dL — ABNORMAL HIGH (ref 0.4–1.2)
GFR calc Af Amer: 44 mL/min — ABNORMAL LOW (ref >60)
GFR calc non Af Amer: 34 mL/min — ABNORMAL LOW (ref >60)
GFR calc non Af Amer: 36 mL/min — ABNORMAL LOW (ref >60)
Total Bilirubin: 0.7 mg/dL (ref 0.3–1.2)

## 2010-08-08 LAB — LIPID PANEL
Cholesterol: 186 mg/dL (ref 0–200)
LDL Cholesterol: UNDETERMINED mg/dL (ref 0–99)
Triglycerides: 435 mg/dL — ABNORMAL HIGH (ref <150)
VLDL: UNDETERMINED mg/dL (ref 0–40)

## 2010-08-08 LAB — CBC
MCHC: 34.3 g/dL (ref 30.0–36.0)
MCV: 98.1 fL (ref 78.0–100.0)
Platelets: 107 10*3/uL — ABNORMAL LOW (ref 150–400)

## 2010-08-08 LAB — GLUCOSE, CAPILLARY: Glucose-Capillary: 118 mg/dL — ABNORMAL HIGH (ref 70–99)

## 2010-08-08 LAB — POCT CARDIAC MARKERS: Myoglobin, poc: 75.5 ng/mL (ref 12–200)

## 2010-08-08 LAB — TSH: TSH: 0.626 u[IU]/mL (ref 0.350–4.500)

## 2010-08-13 ENCOUNTER — Other Ambulatory Visit: Payer: Self-pay | Admitting: Internal Medicine

## 2010-08-15 ENCOUNTER — Other Ambulatory Visit: Payer: Self-pay | Admitting: *Deleted

## 2010-08-15 DIAGNOSIS — F411 Generalized anxiety disorder: Secondary | ICD-10-CM

## 2010-08-15 MED ORDER — DIAZEPAM 2 MG PO TABS: 2.0000 mg | ORAL_TABLET | Freq: Every evening | ORAL | Status: DC | PRN

## 2010-08-25 ENCOUNTER — Telehealth: Payer: Self-pay | Admitting: Internal Medicine

## 2010-08-25 DIAGNOSIS — I4891 Unspecified atrial fibrillation: Secondary | ICD-10-CM

## 2010-08-25 MED ORDER — DILTIAZEM HCL ER COATED BEADS 300 MG PO CP24: 300.0000 mg | ORAL_CAPSULE | Freq: Every day | ORAL | Status: DC

## 2010-08-25 MED ORDER — DIGOXIN 125 MCG PO TABS: 125.0000 ug | ORAL_TABLET | Freq: Every day | ORAL | Status: DC

## 2010-08-25 NOTE — Telephone Encounter (Signed)
Pt states that she has been out of her diltiazem for about 3 weeks. Please call her to talk to her about her medication. NOT sure what she is to be taking. Pt sounds very confused.

## 2010-08-25 NOTE — Telephone Encounter (Signed)
Spoke with patient. She sound a little confused about what medication to take some time , Metoprolol or Diltiazem 300 mg. Then she remembers that Metoprolol 50 mg every AM and 25 mg every evening was discontinue. And she was to start Diltiazem 300 mg daily. At the end pt. Said she needs to have a refill for Diltiazem 300 mg and Digoxin 0.125 mg once a day.

## 2010-09-05 ENCOUNTER — Encounter: Payer: Self-pay | Admitting: Internal Medicine

## 2010-09-06 ENCOUNTER — Ambulatory Visit (INDEPENDENT_AMBULATORY_CARE_PROVIDER_SITE_OTHER): Payer: Medicare Other | Admitting: Internal Medicine

## 2010-09-06 ENCOUNTER — Other Ambulatory Visit: Payer: Self-pay | Admitting: *Deleted

## 2010-09-06 ENCOUNTER — Encounter (INDEPENDENT_AMBULATORY_CARE_PROVIDER_SITE_OTHER): Payer: Medicare Other | Admitting: *Deleted

## 2010-09-06 ENCOUNTER — Encounter: Payer: Self-pay | Admitting: Internal Medicine

## 2010-09-06 VITALS — BP 152/86 | HR 80 | Temp 98.5°F | Wt 171.0 lb

## 2010-09-06 DIAGNOSIS — R609 Edema, unspecified: Secondary | ICD-10-CM

## 2010-09-06 DIAGNOSIS — I824Y9 Acute embolism and thrombosis of unspecified deep veins of unspecified proximal lower extremity: Secondary | ICD-10-CM

## 2010-09-06 DIAGNOSIS — R600 Localized edema: Secondary | ICD-10-CM

## 2010-09-06 DIAGNOSIS — F411 Generalized anxiety disorder: Secondary | ICD-10-CM

## 2010-09-06 DIAGNOSIS — I82409 Acute embolism and thrombosis of unspecified deep veins of unspecified lower extremity: Secondary | ICD-10-CM

## 2010-09-06 DIAGNOSIS — I82419 Acute embolism and thrombosis of unspecified femoral vein: Secondary | ICD-10-CM

## 2010-09-06 LAB — BASIC METABOLIC PANEL
BUN: 18 mg/dL (ref 6–23)
CO2: 26 mEq/L (ref 19–32)
Chloride: 102 mEq/L (ref 96–112)
Creatinine, Ser: 1.13 mg/dL (ref 0.4–1.2)
Glucose, Bld: 155 mg/dL — ABNORMAL HIGH (ref 70–99)
Potassium: 4.1 mEq/L (ref 3.5–5.1)

## 2010-09-06 LAB — CBC
HCT: 35 % — ABNORMAL LOW (ref 36.0–46.0)
HCT: 36.7 % (ref 36.0–46.0)
Hemoglobin: 12.9 g/dL (ref 12.0–15.0)
MCHC: 35 g/dL (ref 30.0–36.0)
MCHC: 35.3 g/dL (ref 30.0–36.0)
MCV: 97.4 fL (ref 78.0–100.0)
Platelets: 87 10*3/uL — ABNORMAL LOW (ref 150–400)
RDW: 14.2 % (ref 11.5–15.5)
RDW: 14.3 % (ref 11.5–15.5)
WBC: 5.3 10*3/uL (ref 4.0–10.5)

## 2010-09-06 LAB — DIFFERENTIAL
Basophils Absolute: 0 10*3/uL (ref 0.0–0.1)
Basophils Relative: 1 % (ref 0–1)
Eosinophils Relative: 1 % (ref 0–5)
Lymphocytes Relative: 25 % (ref 12–46)
Monocytes Absolute: 0.3 10*3/uL (ref 0.1–1.0)
Monocytes Relative: 6 % (ref 3–12)

## 2010-09-06 LAB — POCT I-STAT, CHEM 8
Calcium, Ion: 1.15 mmol/L (ref 1.12–1.32)
Chloride: 103 mEq/L (ref 96–112)
Creatinine, Ser: 1.2 mg/dL (ref 0.4–1.2)
Glucose, Bld: 243 mg/dL — ABNORMAL HIGH (ref 70–99)
HCT: 38 % (ref 36.0–46.0)

## 2010-09-06 LAB — PROTIME-INR
INR: 1.1 (ref 0.00–1.49)
Prothrombin Time: 14 seconds (ref 11.6–15.2)

## 2010-09-06 LAB — GLUCOSE, CAPILLARY: Glucose-Capillary: 151 mg/dL — ABNORMAL HIGH (ref 70–99)

## 2010-09-06 LAB — MAGNESIUM: Magnesium: 2.2 mg/dL (ref 1.5–2.5)

## 2010-09-06 LAB — POCT CARDIAC MARKERS: Troponin i, poc: <0.05 ng/mL (ref 0.00–0.09)

## 2010-09-06 LAB — HEPARIN LEVEL (UNFRACTIONATED): Heparin Unfractionated: 0.14 IU/mL — ABNORMAL LOW (ref 0.30–0.70)

## 2010-09-06 MED ORDER — ENOXAPARIN SODIUM 60 MG/0.6ML ~~LOC~~ SOLN
80.0000 mg | Freq: Once | SUBCUTANEOUS | Status: AC
  Administered 2010-09-06: 80 mg via SUBCUTANEOUS

## 2010-09-06 NOTE — Progress Notes (Signed)
  Subjective:    Patient ID: [name], female    DOB: 1927-02-12, 77 y.o.   MRN: [id number]  HPI  She has developed LE edema L>R Sxs ongoing for 10 days No SOB  Pt has hx of AFIB---meds were changed from metoprolol to cardizem  HTN---no home bps.   Past Medical History  Diagnosis Date  . Osteoporosis   . Hyperlipidemia   . Tremor, essential   . CHF (congestive heart failure)   . Type II or unspecified type diabetes mellitus with neurological manifestations, not stated as uncontrolled   . Hypertension   . Thyroid disease   . Diverticulosis of colon   . Atrial fibrillation   . Anxiety    Past Surgical History  Procedure Date  . Cataract extraction 2002  . Pacemaker placement   . L3 compression fraction     reports that she has never smoked. She does not have any smokeless tobacco history on file. She reports that she does not drink alcohol. Her drug history not on file. family history includes Heart attack in her father.  There is no history of Colon cancer. Allergies  Allergen Reactions  . Codeine Sulfate     REACTION: unspecified  . Simvastatin     REACTION: choking, acid reflux     Review of Systems  patient denies chest pain, shortness of breath, orthopnea. Denies lower extremity edema, abdominal pain, change in appetite, change in bowel movements. Patient denies rashes, musculoskeletal complaints. No other specific complaints in a complete review of systems.      Objective:   Physical Exam  Well-developed well-nourished female in no acute distress. HEENT exam atraumatic, normocephalic, extraocular muscles are intact. Neck is supple. No jugular venous distention no thyromegaly. Chest clear to auscultation without increased work of breathing. Cardiac exam S1 and S2 are regular. Abdominal exam active bowel sounds, soft, nontender. Extremities 2 plus edema left. Neurologic exam she is alert without any motor sensory deficits. Gait is normal.         Assessment & Plan:

## 2010-09-07 ENCOUNTER — Ambulatory Visit (INDEPENDENT_AMBULATORY_CARE_PROVIDER_SITE_OTHER): Payer: Medicare Other | Admitting: Internal Medicine

## 2010-09-07 DIAGNOSIS — Z0289 Encounter for other administrative examinations: Secondary | ICD-10-CM

## 2010-09-07 DIAGNOSIS — I82409 Acute embolism and thrombosis of unspecified deep veins of unspecified lower extremity: Secondary | ICD-10-CM

## 2010-09-07 MED ORDER — ENOXAPARIN SODIUM 10 MG/0.1ML ~~LOC~~ SOLN
80.0000 mg | SUBCUTANEOUS | Status: AC
  Administered 2010-09-07: 80 mg via SUBCUTANEOUS

## 2010-09-07 MED ORDER — ALPRAZOLAM 0.25 MG PO TABS: 0.2500 mg | ORAL_TABLET | Freq: Every evening | ORAL | Status: AC | PRN

## 2010-09-07 MED ORDER — WARFARIN SODIUM 5 MG PO TABS: 5.0000 mg | ORAL_TABLET | Freq: Every day | ORAL | Status: DC

## 2010-09-08 ENCOUNTER — Ambulatory Visit: Payer: Medicare Other | Admitting: Internal Medicine

## 2010-09-08 ENCOUNTER — Encounter: Payer: Self-pay | Admitting: Internal Medicine

## 2010-09-08 DIAGNOSIS — I82419 Acute embolism and thrombosis of unspecified femoral vein: Secondary | ICD-10-CM | POA: Insufficient documentation

## 2010-09-08 DIAGNOSIS — O2230 Deep phlebothrombosis in pregnancy, unspecified trimester: Secondary | ICD-10-CM

## 2010-09-08 DIAGNOSIS — I82409 Acute embolism and thrombosis of unspecified deep veins of unspecified lower extremity: Secondary | ICD-10-CM

## 2010-09-08 MED ORDER — ENOXAPARIN SODIUM 80 MG/0.8ML ~~LOC~~ SOLN
80.0000 mg | SUBCUTANEOUS | Status: DC
  Administered 2010-09-08 – 2010-09-09 (×2): 80 mg via SUBCUTANEOUS

## 2010-09-08 NOTE — Assessment & Plan Note (Signed)
Pt sent for venous duplex Later: ultrasound positive for DVT Treat with daily lovenox Start warfarin Note patient has had trouble with warfarin compliance in the past (used for Afib) It will be best to treat with warfarin and accept the risk Pt understands

## 2010-09-09 ENCOUNTER — Ambulatory Visit (INDEPENDENT_AMBULATORY_CARE_PROVIDER_SITE_OTHER): Payer: Medicare Other | Admitting: Internal Medicine

## 2010-09-09 ENCOUNTER — Encounter: Payer: Self-pay | Admitting: Internal Medicine

## 2010-09-09 VITALS — BP 126/82 | Temp 98.2°F | Wt 168.0 lb

## 2010-09-09 DIAGNOSIS — I82419 Acute embolism and thrombosis of unspecified femoral vein: Secondary | ICD-10-CM

## 2010-09-09 DIAGNOSIS — E119 Type 2 diabetes mellitus without complications: Secondary | ICD-10-CM

## 2010-09-09 DIAGNOSIS — I10 Essential (primary) hypertension: Secondary | ICD-10-CM

## 2010-09-09 DIAGNOSIS — I4891 Unspecified atrial fibrillation: Secondary | ICD-10-CM

## 2010-09-09 DIAGNOSIS — I82409 Acute embolism and thrombosis of unspecified deep veins of unspecified lower extremity: Secondary | ICD-10-CM

## 2010-09-09 DIAGNOSIS — N289 Disorder of kidney and ureter, unspecified: Secondary | ICD-10-CM

## 2010-09-09 DIAGNOSIS — I824Y9 Acute embolism and thrombosis of unspecified deep veins of unspecified proximal lower extremity: Secondary | ICD-10-CM

## 2010-09-12 ENCOUNTER — Ambulatory Visit (INDEPENDENT_AMBULATORY_CARE_PROVIDER_SITE_OTHER): Payer: Medicare Other | Admitting: Internal Medicine

## 2010-09-12 VITALS — BP 144/74 | Temp 97.8°F | Wt 171.0 lb

## 2010-09-12 DIAGNOSIS — N289 Disorder of kidney and ureter, unspecified: Secondary | ICD-10-CM | POA: Insufficient documentation

## 2010-09-12 DIAGNOSIS — I824Y9 Acute embolism and thrombosis of unspecified deep veins of unspecified proximal lower extremity: Secondary | ICD-10-CM

## 2010-09-12 DIAGNOSIS — I82419 Acute embolism and thrombosis of unspecified femoral vein: Secondary | ICD-10-CM

## 2010-09-12 DIAGNOSIS — I82409 Acute embolism and thrombosis of unspecified deep veins of unspecified lower extremity: Secondary | ICD-10-CM

## 2010-09-12 MED ORDER — WARFARIN SODIUM 5 MG PO TABS: 2.5000 mg | ORAL_TABLET | Freq: Every day | ORAL | Status: DC

## 2010-09-12 NOTE — Assessment & Plan Note (Signed)
Continue current medications. 

## 2010-09-12 NOTE — Progress Notes (Signed)
  Subjective:    Patient ID: [name], female    DOB: 19-Oct-1926, 77 y.o.   MRN: [id number]  HPI  Patient comes in for followup. I reviewed my last note. Patient was diagnosed with a deep venous thromboembolism. Since that time she's been receiving daily Lovenox and a started warfarin therapy. I have some concerns of ongoing warfarin therapy due to her previous trouble monitoring her own utilization of warfarin. What I mean by this was that she had compliance issues. Patient has not had any bleeding episodes. She feels like the edema in her affected leg is significantly less. She is not having any trouble with warfarin at this time. She otherwise is feeling well except for her ongoing issues with back pain. Patient has a history of atrial fibrillation and congestive heart failure. She denies any dyspnea, palpitations, PND, orthopnea.  Past Medical History  Diagnosis Date  . Osteoporosis   . Hyperlipidemia   . Tremor, essential   . CHF (congestive heart failure)   . Type II or unspecified type diabetes mellitus with neurological manifestations, not stated as uncontrolled   . Hypertension   . Thyroid disease   . Diverticulosis of colon   . Atrial fibrillation   . Anxiety    Past Surgical History  Procedure Date  . Cataract extraction 2002  . Pacemaker placement   . L3 compression fraction     reports that she has never smoked. She does not have any smokeless tobacco history on file. She reports that she does not drink alcohol. Her drug history not on file. family history includes Heart attack in her father.  There is no history of Colon cancer. Allergies  Allergen Reactions  . Codeine Sulfate     REACTION: unspecified  . Simvastatin     REACTION: choking, acid reflux     Review of Systems    patient denies chest pain, shortness of breath, orthopnea. Denies lower extremity edema, abdominal pain, change in appetite, change in bowel movements. Patient denies rashes,  musculoskeletal complaints. No other specific complaints in a complete review of systems.    Objective:   Physical Exam Well-developed, well-nourished female in no acute distress. HEENT exam atraumatic, normocephalic, struck her muscles are intact. Neck is supple without lymphadenopathy or jugular venous distention. Chest clear to auscultation cardiac exam S1-S2 are irregular. No gallop. Abdominal examination active bowel sounds, soft and nontender. Extremities trace edema bilaterally. Neurologic exam she is alert. She has an intentional tremor.       Assessment & Plan:

## 2010-09-12 NOTE — Progress Notes (Signed)
  Subjective:    Patient ID: HAJAR PENNINGER, female    DOB: 01/10/1927, 77 y.o.   MRN: [id number]  HPI    Review of Systems     Objective:   Physical Exam        Assessment & Plan:   Subjective:    Patient ID: SELENE PELTZER, female    DOB: 24-Aug-1926, 77 y.o.   MRN: [id number]  HPI  Patient comes in for followup. I reviewed my last note. Patient was diagnosed with a deep venous thromboembolism. Since that time she's been receiving daily Lovenox and a started warfarin therapy. I have some concerns of ongoing warfarin therapy due to her previous trouble monitoring her own utilization of warfarin. What I mean by this was that she had compliance issues. Patient has not had any bleeding episodes. She feels like the edema in her affected leg is significantly less. She is not having any trouble with warfarin at this time. She otherwise is feeling well except for her ongoing issues with back pain. Patient has a history of atrial fibrillation and congestive heart failure. She denies any dyspnea, palpitations, PND, orthopnea.  Past Medical History  Diagnosis Date  . Osteoporosis   . Hyperlipidemia   . Tremor, essential   . CHF (congestive heart failure)   . Type II or unspecified type diabetes mellitus with neurological manifestations, not stated as uncontrolled   . Hypertension   . Thyroid disease   . Diverticulosis of colon   . Atrial fibrillation   . Anxiety    Past Surgical History  Procedure Date  . Cataract extraction 2002  . Pacemaker placement   . L3 compression fraction     reports that she has never smoked. She does not have any smokeless tobacco history on file. She reports that she does not drink alcohol. Her drug history not on file. family history includes Heart attack in her father.  There is no history of Colon cancer. Allergies  Allergen Reactions  . Codeine Sulfate     REACTION: unspecified  . Simvastatin     REACTION: choking, acid reflux      Review of Systems    patient denies chest pain, shortness of breath, orthopnea. Denies lower extremity edema, abdominal pain, change in appetite, change in bowel movements. Patient denies rashes, musculoskeletal complaints. No other specific complaints in a complete review of systems.    Objective:   Physical Exam Well-developed, well-nourished female in no acute distress. HEENT exam atraumatic, normocephalic, struck her muscles are intact. Neck is supple without lymphadenopathy or jugular venous distention. Chest clear to auscultation cardiac exam S1-S2 are irregular. No gallop. Abdominal examination active bowel sounds, soft and nontender. Extremities trace edema bilaterally. Neurologic exam she is alert. She has an intentional tremor.       Assessment & Plan:

## 2010-09-12 NOTE — Assessment & Plan Note (Signed)
Likely due to diabetes and hypertension. Continue current medications for the time being. Will need regular followup.

## 2010-09-12 NOTE — Patient Instructions (Addendum)
Do not take warfarin today. After today start taking 1/2 of a 5 mg tablet daily. Get your protime checked this Friday.

## 2010-09-12 NOTE — Assessment & Plan Note (Signed)
protime is too high today Hold warfarin for one day and then decrease warfarin to 1/2 tablet (total=2.5 mg) daily

## 2010-09-12 NOTE — Assessment & Plan Note (Signed)
Acute deep venous thrombosis. Best treatment course is warfarin. She was given her last dose of Lovenox in the office today. Patient has been on warfarin previously for atrial fibrillation. This was discontinued secondary to compliance issues. This was discussed with her in detail. I am worried about her ability to monitor and control her own warfarin usage. I still think given the benefit and risk ratios that warfarin therapy is still the best treatment course. I will follow up with her next week. She'll have her pro time checked early next week.

## 2010-09-12 NOTE — Assessment & Plan Note (Addendum)
Adequate control. Continue current medications. 

## 2010-09-12 NOTE — Assessment & Plan Note (Signed)
2 new diagnosis of acute DVT we will resume warfarin.

## 2010-09-16 ENCOUNTER — Ambulatory Visit (INDEPENDENT_AMBULATORY_CARE_PROVIDER_SITE_OTHER): Payer: Medicare Other | Admitting: Internal Medicine

## 2010-09-16 DIAGNOSIS — I82409 Acute embolism and thrombosis of unspecified deep veins of unspecified lower extremity: Secondary | ICD-10-CM

## 2010-09-16 NOTE — Patient Instructions (Signed)
Hold for 2 days then,2.5 mg everyday, check in 2 weeks

## 2010-09-21 ENCOUNTER — Other Ambulatory Visit: Payer: Self-pay | Admitting: Internal Medicine

## 2010-09-25 ENCOUNTER — Emergency Department (HOSPITAL_COMMUNITY)
Admission: EM | Admit: 2010-09-25 | Discharge: 2010-09-25 | Disposition: A | Discharge status: Home / Self Care | Payer: Medicare Other | Attending: Emergency Medicine | Admitting: Emergency Medicine

## 2010-09-25 DIAGNOSIS — T368X5A Adverse effect of other systemic antibiotics, initial encounter: Secondary | ICD-10-CM | POA: Insufficient documentation

## 2010-09-25 DIAGNOSIS — I4891 Unspecified atrial fibrillation: Secondary | ICD-10-CM | POA: Insufficient documentation

## 2010-09-25 DIAGNOSIS — L270 Generalized skin eruption due to drugs and medicaments taken internally: Secondary | ICD-10-CM | POA: Insufficient documentation

## 2010-09-25 DIAGNOSIS — E119 Type 2 diabetes mellitus without complications: Secondary | ICD-10-CM | POA: Insufficient documentation

## 2010-09-25 DIAGNOSIS — I2510 Atherosclerotic heart disease of native coronary artery without angina pectoris: Secondary | ICD-10-CM | POA: Insufficient documentation

## 2010-09-25 DIAGNOSIS — I10 Essential (primary) hypertension: Secondary | ICD-10-CM | POA: Insufficient documentation

## 2010-09-25 DIAGNOSIS — Z950 Presence of cardiac pacemaker: Secondary | ICD-10-CM | POA: Insufficient documentation

## 2010-09-26 ENCOUNTER — Ambulatory Visit: Payer: Medicare Other | Admitting: Internal Medicine

## 2010-09-27 ENCOUNTER — Encounter: Payer: Self-pay | Admitting: Internal Medicine

## 2010-09-27 ENCOUNTER — Ambulatory Visit (INDEPENDENT_AMBULATORY_CARE_PROVIDER_SITE_OTHER): Payer: Medicare Other | Admitting: Internal Medicine

## 2010-09-27 VITALS — BP 124/80 | HR 88 | Temp 98.3°F

## 2010-09-27 DIAGNOSIS — I82419 Acute embolism and thrombosis of unspecified femoral vein: Secondary | ICD-10-CM

## 2010-09-27 DIAGNOSIS — I82409 Acute embolism and thrombosis of unspecified deep veins of unspecified lower extremity: Secondary | ICD-10-CM

## 2010-09-27 DIAGNOSIS — I824Y9 Acute embolism and thrombosis of unspecified deep veins of unspecified proximal lower extremity: Secondary | ICD-10-CM

## 2010-09-27 NOTE — Progress Notes (Signed)
  Subjective:    Patient ID: [name], female    DOB: 03/28/1927, 77 y.o.   MRN: [id number]  HPI  F/u for DVT Tolerating meds-no bleeding complications  She is at high risk for warfarin therapy but benefits outweigh risks. No other concerns  Past Medical History  Diagnosis Date  . Osteoporosis   . Hyperlipidemia   . Tremor, essential   . CHF (congestive heart failure)   . Type II or unspecified type diabetes mellitus with neurological manifestations, not stated as uncontrolled   . Hypertension   . Thyroid disease   . Diverticulosis of colon   . Atrial fibrillation   . Anxiety    Past Surgical History  Procedure Date  . Cataract extraction 2002  . Pacemaker placement   . L3 compression fraction     reports that she has never smoked. She does not have any smokeless tobacco history on file. She reports that she does not drink alcohol. Her drug history not on file. family history includes Heart attack in her father.  There is no history of Colon cancer. Allergies  Allergen Reactions  . Codeine Sulfate     REACTION: unspecified  . Simvastatin     REACTION: choking, acid reflux     Review of Systems  patient denies chest pain, shortness of breath, orthopnea. Denies lower extremity edema, abdominal pain, change in appetite, change in bowel movements. Patient denies rashes, musculoskeletal complaints. No other specific complaints in a complete review of systems.      Objective:   Physical Exam  Well-developed well-nourished female in no acute distress. HEENT exam atraumatic, normocephalic, extraocular muscles are intact. Neck is supple. No jugular venous distention no thyromegaly. Chest clear to auscultation without increased work of breathing. Cardiac exam S1 and S2 are regular. Abdominal exam active bowel sounds, soft, nontender. Extremities no edema.       Assessment & Plan:

## 2010-09-27 NOTE — Patient Instructions (Signed)
  Latest dosing instructions   Total Sun Mon Tue Wed Thu Fri Sat   15 2.5 mg 2.5 mg 2.5 mg 2.5 mg 2.5 mg  2.5 mg    (5 mg0.5) (5 mg0.5) (5 mg0.5) (5 mg0.5) (5 mg0.5)  (5 mg0.5)        

## 2010-09-27 NOTE — Assessment & Plan Note (Signed)
No bleeding complications Will continue warfarin. See new medications.

## 2010-10-02 ENCOUNTER — Encounter: Payer: Self-pay | Admitting: Internal Medicine

## 2010-10-04 NOTE — Consult Note (Signed)
NAMEMERRICK, [name]              ACCOUNT NO.:  [id number]   MEDICAL RECORD NO.:  [id number]          PATIENT TYPE:  INP   LOCATION:  3729                         FACILITY:  [facility]   PHYSICIAN:  Doylene Canning. Ladona Ridgel, MD    DATE OF BIRTH:  [birth year]   DATE OF CONSULTATION:  DATE OF DISCHARGE:                                 CONSULTATION   ELECTROPHYSIOLOGY CONSULTATION   REFERRING PHYSICIAN:  The consultation is requested by Dr. Rene Paci.   INDICATIONS FOR CONSULTATION:  Evaluation of syncope in a patient with  atrial fibrillation on Tikosyn.   HISTORY OF PRESENT ILLNESS:  The patient is a very pleasant 77 year old  woman who I have taken care of in the past.  She has a history of atrial  fibrillation which was persistent.  She was started on Tikosyn several  years ago and has maintained sinus rhythm for the most part very nicely  over the last several years.  The patient had previously been on Rythmol  and failed returning to atrial fibrillation and had been on amiodarone  and developed photosensitivity.  The patient has had a history of  fatigue and dizziness in the past.  She was in her usual state of health  when she got up to go to the bathroom and then after getting up from the  bathroom had gone into the kitchen where she became dizzy and  lightheaded and passed out.  She subsequently  fractured part of her  lumbar spine.  She has an ecchymotic area over her right orbit though  there is no fracture there.  Since she has been in the hospital,  orthostatic vitals have been obtained which demonstrate a 40-50 mm drop  in blood pressure between initial standing and standing after 5 minutes.  On telemetry she has had brief bursts of atrial fibrillation but mostly  has been atrially paced and maintained sinus rhythm.  The patient denies  chest pain.  She denies shortness of breath.   PAST MEDICAL HISTORY:  Her past medical history is notable for  hypertension.  She has a  history of diabetes.  She has a history of  persistent tremor.  She has a history of treated hypothyroidism.  She  has a history of diverticulosis and irritable bowel disease.   SOCIAL HISTORY:  The patient lives alone in [place].  She previously  worked at [employer].  She denies tobacco or ethanol use.   FAMILY HISTORY:  Her family history is noncontributory at her advanced  age, except of note she has several sisters who have had problems with  vertigo in the past.   MEDICATIONS:  Include Coumadin, Tikosyn, Zoloft, Amaryl, potassium,  Toprol, Cardizem, Synthroid, Avapro, digoxin and Mirapex.   REVIEW OF SYSTEMS:  Her review of systems is as noted in the HPI.  Also  she has some mild pain in her lower back.  She has problems with chronic  tremor in her upper extremities.  Otherwise, all systems were reviewed  and negative except as noted above and a history of diarrhea with her  irritable bowel syndrome.  PHYSICAL EXAMINATION:  GENERAL APPEARANCE:  On physical examination she  is a pleasant, elderly-appearing woman in no distress.  VITAL SIGNS:  Blood pressure was 126/71 the pulse 77 and regular,  respirations were 16, temperature was 97.  HEENT:  Normocephalic, atraumatic.  Pupils are equal and round.  Oropharynx was moist.  Sclerae anicteric.  There was an ecchymotic area  over the right lateral orbit.  NECK:  The neck revealed no jugular distension.  There are no  thyromegaly.  Trachea is midline.  Carotid's are  2+ and symmetric.  LUNGS:  Clear bilaterally auscultation.  No wheezes, rales or rhonchi.  There is no increased work of breathing.  CARDIOVASCULAR:  Cardiac exam revealed a regular rate and rhythm with  normal S1-S2.  The PMI was not enlarged nor was it laterally displaced.  There are no murmurs, rubs or gallops appreciated.  ABDOMEN:  Soft, nontender, nondistended.  There is no organomegaly.  Bowel sounds are present.  There is no rebound or guarding.  BACK:   The lower back was tender to palpation.  EXTREMITIES:  The extremities demonstrated no cyanosis, clubbing or  edema.  Pulses are 2+ and symmetric.  NEUROLOGIC:  Alert and oriented x3.  Cranial nerves II-XII are intact.  Strength is 5/5 and symmetric.   CLINICAL DATA:  Multiple CT scans demonstrated a simple compression  fracture at L3.  She also had atrophy.  EKG demonstrates sinus rhythm  with atrial pacing   LABORATORY DATA:  Labs of importance notable for creatinine of 1.08 and  potassium of 4.3, hemoglobin was 12.   IMPRESSION:  1. Syncope.  2. Vertigo.  3. History of persistent atrial fibrillation.  4. Orthostasis.  5. Chronic Tikosyn therapy secondary to her atrial fibrillation.  6. Chronic Coumadin therapy.   DISCUSSION AND RECOMMENDATIONS:  [name] syncope and dizziness  make her a very poor Coumadin candidate. At this point I would stop the  Coumadin. Would continue to check orthostatic vitals while she is in the  hospital.  Would continue Tikosyn.  I would recommend aspirin daily for  thromboembolic prevention, in full strength version, and would agree  with a rehabilitation consult. Would avoid all diuretics and will  consider stopping Cardizem and allow her blood pressure to run a little  on the high side.      Doylene Canning. Ladona Ridgel, MD  Electronically Signed     GWT/MEDQ  D:  10/09/2006  T:  10/09/2006  Job:  [id number]   cc:   Vikki Ports A. Felicity Coyer, MD  Corwin Levins, MD

## 2010-10-04 NOTE — H&P (Signed)
[name], [name]              ACCOUNT NO.:  [id number]   MEDICAL RECORD NO.:  [id number]          PATIENT TYPE:  INP   LOCATION:  2022                         FACILITY:  [facility]   PHYSICIAN:  [name]. Maria Som, MD, FACCDATE OF BIRTH:  Jun 22, 1926   DATE OF ADMISSION:  07/12/2008  DATE OF DISCHARGE:                              HISTORY & PHYSICAL   PRIMARY CARE PHYSICIAN:  [name]. Swords, MD.   CARDIOLOGIST/ELECTROPHYSIOLOGIST:  [name]. Maria Ridgel, MD   CHIEF COMPLAINT:  Weakness, palpitations, and dizziness.   HISTORY OF PRESENT ILLNESS:  [name] is a 77 year old woman with  past medical history of paroxysmal/persistent AFib, tachybrady syndrome,  status post AV node ablation with persistent AV conduction, post  permanent pacemaker insertion, hypothyroidism, hypertension, and  diabetes mellitus type 2.  She presents with increased weakness,  palpitation, and dizziness.  On July 07, 2008, states she developed  the same symptoms, which were similar to her prior episodes of atrial  fibrillation.  Symptoms lasted approximately 24 hours and then resolved.  This morning again developed the same symptoms and came to the emergency  department.  She reports doing well on Tikosyn with regards to the  symptoms, AFib.  She questions more frequent breakthrough now that she  has been using the generic version.  She has also developed some  symptoms of possible orthostasis stating that since the symptoms have  been present she will get lightheaded if she bends over and then stands  up.  However, she also reports a history of vertigo, which can account  for this.  Otherwise, she denies any chest pain, shortness of breath.  In the past, she has not tolerated amiodarone secondary to  photosensitivity.  She was on Coumadin for approximately 3 years in the  past, but this continued after a fall in April 2009 and concern for  patient to be too high risk for continuation.   PAST MEDICAL  HISTORY:  Paroxysmal/persistent AFib, AV node ablation with  persistent AV node conduction and permanent pacemaker placement,  tachybrady syndrome, status post permanent pacemaker insertion,  hypertension, hypothyroidism, diabetes mellitus type 2, history of fall  with fracture of the right 10th rib in February 2009, and multiple  compression fractures.   ALLERGIES:  CODEINE.   MEDICATIONS:  1. Zocor 20 mg p.o. daily.  2. Glimepiride 4 mg p.o. daily.  3. Amitiza 24 mg p.o. b.i.d.  4. Hyoscyamine 0.375 mg p.o. b.i.d.  5. Tikosyn 250 mcg p.o. b.i.d.  6. Sertraline 50 mg p.o. b.i.d. daily.  7. Metoprolol succinate 50 mg p.o. in the morning and 25 mg p.o. in      the evening.  8. Cyclobenzaprine 5 mg p.o. b.i.d. p.r.n.  9. Diazepam 10 mg p.o. b.i.d. p.r.n., I believe this is half tablet      meaning 5 mg.  10.Darvocet-N 100 p.o. b.i.d. p.r.n. pain.  11.Potassium chloride 20 mEq p.o. daily.  12.Micardis 80 mg p.o. daily.  13.Synthroid 75 mcg p.o. daily.  14.Aspirin 81 mg p.o. daily.   SOCIAL HISTORY:  The patient lives in [place] alone.  She is retired  and actively participate in church.  She denies any tobacco use, alcohol  use, or illicit drug use.   FAMILY HISTORY:  Noncontributory.   REVIEW OF SYSTEMS:  Positive for subjective fevers, chills, and sweats  associated with the cough and nasal congestion.  Otherwise, per HPI.  All other systems reviewed and negative.   PHYSICAL EXAMINATION:  VITAL SIGNS: Temperature 97.4, pulse 156 down to  103, respirations 18,  blood pressure 140/63 down to 116/69, O2  saturation 100% on 2 L.  GENERAL:  This is an 77 year old woman lying in bed with an essential tremor  in no acute distress.  HEENT:  Pupils are equally round and reactive to light and  accommodation.  Extraocular movements intact, sclera anicteric.  Mucous  membranes are moist.  Oropharynx without any erythema or exudate.  NECK:  Supple without lymphadenopathy, thyromegaly,  bruit, or JVD.  CARDIOVASCULAR:  She is tachycardic with an irregularly irregular  rhythm.  S1 and S2 are appreciated.  No murmurs, gallops, or rubs.  PMI  is within normal limits.  Pulses were 2+ and equal without bruits.  LUNGS:  Good air movement bilaterally with bibasilar crackles initially  that cleared after first few inspirations.  Otherwise, clear to  auscultation bilaterally without any wheezes, rhonchi, or rales.  ABDOMEN:  Soft, nontender, nondistended with normoactive bowel sounds.  No rebound or guarding.  No hepatosplenomegaly.  EXTREMITIES: Without any cyanosis, clubbing, or edema.  MUSCULOSKELETAL:  No gross joint deformity, effusions, or redness.  SKIN:  No rashes.  There is an area of ecchymosis in the left forearm.  NEUROLOGY:  The patient is alert and oriented x3.  Cranial nerves II  through XII are grossly intact.  Strength is 5/5 x4 extremities.  Sensation is normal throughout.  The patient has resting and intentional  tremor noted.   EKG rate 158, rhythm is atrial fibrillation, axis is normal.  Intervals,  PR 142, QRS 74, QTc 473.  No ischemic changes noted.   LABORATORY DATA:  WBC 5.3, hemoglobin 12.9, and platelets 97,000.  Sodium 140, potassium  3.9, chloride 104, bicarb 27, BUN 18, creatinine  1.2, platelets 243,000, lipase is 27.  Point-of-care markers myoglobin  62.4, troponin I less than 0.05, and CK-MB less than 1.0.   ASSESSMENT AND PLAN:  This is a 77 year old female with history of  paroxysmal/persistent atrial fibrillation who has been doing well on  Tikosyn.  Given, the patient is symptomatic while in atrial fibrillation  with rapid ventricular response, we will admit to telemetry, rate  control using diltiazem drip.  We will continue the patient's home  medications.  The patient has a permanent pacemaker, therefore, there is  no concern over-medication as long as blood pressure tolerates.  Therefore we will continue to increase Cardizem drip as  needed to  maintain a heart rate less than 100.  We will also start heparin drip  since the onset of the symptoms are approximating 36 hours now.  In the  meantime we will discontinue aspirin.  Intentions are to monitor patient  and if still in atrial fibrillation tomorrow morning to proceed with  transesophageal echocardiography to rule out atrial thrombus and if  negative proceed with cardioversion if patient is still in atrial  fibrillation.  Otherwise, if the patient has converted to normal sinus  rhythm, we will continue Coumadin for approximately 1 month and then  discontinue.  If direct current cardioversion is done, the patient will  also need Coumadin for approximately 4  weeks before discontinuing.  We  will check echo, either transesophageal echocardiography if still in  atrial fibrillation tomorrow verus 2-D echo if back in sinus rhythm to  further assess atrial and ventricular size as well as function.  We will  also check a TSH and a free T4 given the patient's history of  hypothyroidism and current use of Synthroid to make sure this is not  contributing to her atrial fibrillation.  Otherwise, we will continue  her chronic medications as listed in her medication list.  Of note, the  patient has platelets of 97,000, review of records reveals that she has  had thrombocytopenia dating back quite a few years, although this is  lower than her usual.  We will discontinue aspirin as mentioned above  and recheck CBC in the morning.  May need further workup if this has not  already been done.      Mariea Stable, MD  Electronically Signed      [name]. Maria Som, MD, [facility]  Electronically Signed    MA/MEDQ  D:  07/12/2008  T:  07/13/2008  Job:  [id number]

## 2010-10-04 NOTE — Assessment & Plan Note (Signed)
[facility]                         ELECTROPHYSIOLOGY OFFICE NOTE   PETRONA, WYETH                     MRN:          [id number]  DATE:02/11/2007                            DOB:          [birth year]    Ms. [name] was seen today in the device clinic as she was having  problems with her [facility] monitor at home.  On device interrogation,  she was in a rapid atrial fibrillation with conduction into the 160s  which after about 20 minutes, still had only slowed down to about 140  beats per minute in her ventricle.  Interrogation of her device  demonstrates P waves of 1 mV to 1.4 mV with an atrial impedance of 396  ohms.  Her R waves measured 5.6 mV to 8 mV with an RV impedance of 641  ohms.  Thresholds were not done as her heart rate exceeded 100 beats per  minute.  Battery voltage was 2.75 V.  She is not pacemaker dependent.  She had 220 mode switch episodes, 5 ventricular high rate episodes.  She  is programmed DDDR with rate of 130 with a paced rate of 250 msec.  She  is currently A-pacing 95% of the time and V-pacing 66% of the time.  She  is on Tikosyn therapy.  I reviewed her interrogation today with Dr.  Ladona Holmes.  She is going to call back with a complete medication list for  adjustments and a return office visit was made for 1 month with Dr.  Ladona Holmes.      Maria Balsam, RN,BSN  Electronically Signed      Doylene Canning. Maria Ridgel, MD  Electronically Signed   AS/MedQ  DD: 02/11/2007  DT: 02/12/2007  Job #: [id number]

## 2010-10-04 NOTE — Assessment & Plan Note (Signed)
[facility]                         ELECTROPHYSIOLOGY OFFICE NOTE   [name]                     MRN:          [id number]  DATE:02/11/2008                            DOB:          11-14-1926    [name] returns today for followup.  [name] is a very pleasant middle-  aged woman with a history of persistent atrial fibrillation who has been  maintained in sinus rhythm very nicely on Tikosyn therapy.  [name] returns  today for followup.  The patient denies chest pain or shortness of  breath.  [name], of note, has a history of AV node ablation, but does have  residual AV conduction.  [name] had no specific complaints today.  [name] has  rare palpitations.   PHYSICAL EXAMINATION:  GENERAL:  [name] is a pleasant elderly-appearing  woman in no distress.  VITAL SIGNS:  Blood pressure 130/66, pulse was 80 and regular, and  respirations were 18.  NECK:  No jugular venous distention.  LUNGS:  Clear bilaterally to auscultation.  No wheezes, rales, or  rhonchi are present.  CARDIOVASCULAR:  Regular rate and rhythm with normal S1-S2.  EXTREMITIES:  No edema.   MEDICATIONS:  1. Synthroid 75 mic daily.  2. Micardis 80 mg half tablet daily.  3. Tikosyn 250 mcg twice daily.  4. Aspirin 81 mg daily.  5. Metoprolol ER 50 mg in the morning, half tablet in the evening.   Interrogation of her pacemaker demonstrates a Medtronic Kappa 800.  The  P-waves were 4 and the R-waves were 8.  The impedance 407 in the A and  592 in the V.  The threshold is 0.5 at 0.4 in the A and 0.75 at 0.4 in  the RV.  Battery voltage was 2.73 volts.  [name] was mode switched  approximately 5% of the time.  Her underlying rate was 75.   IMPRESSION:  1. Paroxysmal/persistent atrial fibrillation, now maintained mostly      sinus rhythm on Tikosyn.  2. History of atrioventricular node ablation with persistent      atrioventricular conduction.  3. Hypertension.   DISCUSSION:  Overall, [name] is stable.  Her pacemaker is working  normally.  [name] is maintaining sinus rhythm at 95% of the time.  We will  plan to see the patient back in the office for pacemaker followup in 6  months.     Doylene Canning. Ladona Ridgel, MD  Electronically Signed    GWT/MedQ  DD: 02/11/2008  DT: 02/12/2008  Job #: [id number]

## 2010-10-04 NOTE — Discharge Summary (Signed)
[name], [name]              ACCOUNT NO.:  [id number]   MEDICAL RECORD NO.:  [id number]          PATIENT TYPE:  INP   LOCATION:  2022                         FACILITY:  [facility]   PHYSICIAN:  Doylene Canning. Ladona Ridgel, MD    DATE OF BIRTH:  07-Nov-1926   DATE OF ADMISSION:  07/12/2008  DATE OF DISCHARGE:  07/13/2008                               DISCHARGE SUMMARY   PROCEDURES PERFORMED DURING HOSPITALIZATION:  None.   FINAL DISCHARGE DIAGNOSES:  1. Paroxysmal atrial fibrillation.  2. Hypertension.  3. Diabetes.  4. Status post atrioventricular nodal ablation with persistent      atrioventricular node conduction and permanent pacemaker placement.  5. Hypothyroidism.   HOSPITAL COURSE:  This is a 77 year old Caucasian female with past  medical history of paroxysmal and persistent atrial fib, tachy-brady  syndrome, status post AV node ablation and persistent AV conduction post  permanent pacemaker insertion.  The patient presented to the emergency  room secondary to symptoms of possible orthostasis along with  lightheadedness when she bends over and stands up.  She also had recent  history of vertigo.  She denied any chest pain or shortness of breath.  The patient has also been having breakthrough atrial fib on the Tikosyn.  Secondary to the symptoms, the patient was seen in the emergency room  and found to be in atrial fibrillation with rapid ventricular response.  The patient was admitted and placed on a diltiazem drip.  The patient  was continued on her home medications as well she was also started on a  heparin drip.   The patient remained in atrial fibrillation, but her rate was much  better controlled at 80-75 beats per minute on the Cardizem drip.  The  patient also had some paced beats as well.  The patient was seen and  examined by Dr. Ladona Ridgel on day of discharge and found to be stable.  The  patient will be discharged home on current medications along with  Tikosyn with no acute  changes to her medications or additions other than  which she was taking prior to admission.  The patient will follow up in  Dr. Olga Millers office on a previously scheduled appointment on September 04, 2008, at 3:50 p.m.  If the patient has any further recurrent  symptoms, she is to call our office and to speak with Dr. Lubertha Basque nurse  or one of his partners.  At this time, the patient is stable without any  further complaints.   LABORATORIES ON DISCHARGE:  Sodium 135, potassium 4.1, chloride 102, CO2  of 26, glucose 155, BUN 18, and creatinine 1.13.  Hemoglobin 12.3,  hematocrit 35.0, white blood cells 5.3, and platelets 87.  TSH 1.272,  magnesium 2.2, and lipase 27.  EKG on discharge paced rhythm with  underlying atrial fib, ventricular rate between 70 and 80.   DISCHARGE MEDICATIONS:  1. Micardis 80 mg daily.  2. Klor-Con M20 one tablet daily.  3. Sertraline hydrochloride 50 mg 1 tablet daily.  4. Diltiazem 10 mg 1/2 tablet twice a day.  5. Levothyroxine 75 mcg 1 tablet daily.  6. Meclizine 25 mg 1 tablet twice a day.  7. Propoxyphene/APAP 100/650 one tablet b.i.d.  8. Hyoscyamine 0.375 mg b.i.d.  9. Metoprolol 50 mg 1 tablet in the a.m., 1/2 tablet in the p.m.  10.Glimepiride 4 mg 1 tablet daily.  11.Cyclobenzaprine 1 tablet daily.  12.Tikosyn 0.25 mg 2 caps q.12 h.  13.Amitiza 25 mcg 1 tablet b.i.d.  14.HyoMax-SR 0.375 mg 1 tablet b.i.d.   ALLERGIES:  CODEINE.   FOLLOWUP PLANS AND APPOINTMENT:  1. The patient will follow up with Dr. Lewayne Bunting on a previously      scheduled appointment on September 04, 2008, at 3:50 p.m.  2. The patient is to call our office if she has any recurrent symptoms      or tachycardia.  3. The patient is to follow with her primary care physician, Dr. Birdie Sons for continued medical management.  4. The patient has been advised that there are no changes in her      medications at this time.   TIME SPENT WITH THE PATIENT TO INCLUDE  PHYSICIAN TIME:  30 minutes.       [name], NP      Doylene Canning. Ladona Ridgel, MD  Electronically Signed    KML/MEDQ  D:  07/13/2008  T:  07/14/2008  Job:  [id number]   cc:   [name], MD

## 2010-10-04 NOTE — Assessment & Plan Note (Signed)
[facility]                         ELECTROPHYSIOLOGY OFFICE NOTE   [name], [name]                     MRN:          [id number]  DATE:08/07/2007                            DOB:          Oct 17, 1926    [name] returns today for follow up.  She is a very pleasant elderly  woman with a history of atrial fibrillation and complete heart block who  has a history of intolerance to amiodarone who has done very nicely on  Tikosyn therapy for her atrial fibrillation.  She returns today for  follow-up.  She notes that she was placed on statin therapy, and since  then she has had problems with difficulty swallowing.  Otherwise, her  only complaint today was that of continued ongoing benign essential  tremor and that she has gained some weight.   MEDICATIONS:  1. Synthroid 75 mcg daily.  2. Micardis half a tablet of 80 mg daily.  3. Metoprolol 50 mg twice daily.  4. Hyoscyamine.  5. Sertraline.  6. Tikosyn 250 mcg twice daily.  7. Flexeril 5 mg t.i.d. p.r.n.  8. Glimepiride 4 mg daily.   PHYSICAL EXAMINATION:  GENERAL:  She is a pleasant, elderly-appearing  woman in no distress.  VITAL SIGNS:  Blood pressure was 154/80, pulse was 88 and regular,  respirations were 18.  The weight was 176 pounds.  NECK:  No jugular distention.  LUNGS:  Clear bilaterally to auscultation.  No wheezes, rales or rhonchi  are present.  CARDIOVASCULAR:  Regular rate and rhythm with normal S1 and S2.  There  are no murmurs, rubs or gallops that I could appreciate.  EXTREMITIES:  No cyanosis, clubbing or edema.  The pulses were 2+ and  symmetric.   Interrogation of pacemaker demonstrates a Medtronic Kappa 800.  The P  waves were 2, the R waves were 4, the impedance 395 in the atrium, 588  in the ventricle.  The threshold was 0.25 at 0.4 in the A and 0.75 at  0.4 in the B.  Battery voltage was 2.74 volts.  Her underlying rhythm  was sinus brady at 52 beats per minute.   Her mode switching - she was  mode switched 4.5% of the time.   IMPRESSION:  1. Complete heart block.  2. History of atrial fibrillation.  3. Status post AV node ablation and pacemaker insertion.   DISCUSSION:  Overall, [name] is stable.  Her pacemaker is working  normally, and will plan to see the patient back in the office in several  months.  Of note, the patient does not in fact have complete heart  block.     Doylene Canning. Ladona Ridgel, MD  Electronically Signed    GWT/MedQ  DD: 08/07/2007  DT: 08/07/2007  Job #: [id number]

## 2010-10-04 NOTE — Assessment & Plan Note (Signed)
[facility]                                 ON-CALL NOTE   ANNALISIA, INGBER                       MRN:          [id number]  DATE:10/14/2006                            DOB:          Aug 02, 1926    PRIMARY CARDIOLOGIST:  Dr. Ladona Ridgel.   SUPERVISING PHYSICIAN:  Dr. Myrtis Ser.   DATE OF CALL:  On Oct 14, 2006, at approximately 1:15, I received an  alpha page from Wayne [facility] in regards to [name] in regards to  not feeling well.  She was just discharged from the [facility].   On calling, I spoke with [name] initially.  [name] stated that she  is not feeling well, she does not know what to do with her mother.  She  is having multiple problems and concerns.  I explained to [name] that  I was not familiar with her mother and I looked her mother up in E-  chart.  E-chart reflects that she was admitted by primary care on Oct 07, 2006 and on Oct 11, 2006, with a fall and subsequent loss of  consciousness, mild concussion and vertigo.  Dr. Ladona Ridgel was consulted on  the 20th and felt she should not be on Coumadin, given her fall and  vertigo, but was stable from a heart standpoint.  I explained this to  [name] and tried to illicit from her specific concerns.  She just  continued to say that her mother was not doing well since discharge, she  continues to have these spells, she cannot deal with her mother anymore,  she is not sure why she was discharged home, she cannot take care of  her, and that a doctor has never spoken to them.  I asked [name] if  she was having any cardiac difficulties such as palpitations, chest  discomfort or shortness of breath, she said No.  I explained to her  that I worked with Dr. Ladona Ridgel, and according to Dr. Ladona Ridgel his  assessment on the 20th did not show any active cardiac problems.  She  did not seem to understand this.  I explained to her that her primary  care physicians have been treating her for these symptoms and  that the  only way to have evaluation on Sunday afternoon would be to come to the  emergency room for further evaluation.  [name] goes on to elaborate  that EMS was called this morning because she could not get into the  house.  They broke into the house and found the patient lying in bed  stating that she could not get up.  According to the daughter, EMS  recommended transportation to the [facility] but patient refused.  The  daughter could not remember the blood pressure or the glucose upon EMS's  evaluation.   Again I reviewed with the daughter and then with the patient who  admitted and discharged her and what the diagnoses were.  I further  explained Dr. Lubertha Basque consultation note on the 20th and his  recommendations.  The patient requested a medication  to be called in to  fix her problem.  The daughter wanted some answers as to what was the  matter with her.  I explained to both that I could not answer these  questions over the phone.  If she was having specific problems,  especially in regards to her vertigo, dizziness or loss of balance, she  should be evaluated in the emergency room and the emergency room could  call and talk to the appropriate physician, whether it be the primary  care physician or the cardiologist.  I also asked the daughter if she is  having this much difficulty EMS should be summoned for transport.  The  patient reluctantly agreed to come to the emergency room and the  daughter reinforced this idea.  I informed them that I will contact the  emergency room and let them know of their pending arrival.  They were  agreeable to this plan.   I spent approximately 30 minutes on the phone.      Maria Rued, PA-C  Electronically Signed      Luis Abed, MD, [facility]  Electronically Signed   EW/MedQ  DD: 10/14/2006  DT: 10/14/2006  Job #: [id number]

## 2010-10-04 NOTE — Assessment & Plan Note (Signed)
[facility]                         ELECTROPHYSIOLOGY OFFICE NOTE   YANNA, LEAKS                     MRN:          [id number]  DATE:03/12/2007                            DOB:          02/05/1927    Ms. Portell returns today for followup.  She is a very pleasant 77-year-  old woman with a history of symptomatic bradycardia, persistent A-fib,  tachy/brady syndrome and status post pacemaker insertion.  She could not  take amiodarone, but has done very nicely on Tikosyn therapy over the  last year to year and a half.  She does occasionally have palpitations  and was noted on [facility] monitoring to have an episode of A-fib with  rapid ventricular response with heart rates up into the 160s, lasting  for 15-20 minutes.  Otherwise, she has been stable.  She denies chest  pain or shortness of breath.  She remains very active.  She still has  her intention tremor.  She does admit to palpitations.   MEDICATIONS INCLUDE:  1. Synthroid 75 micrograms daily.  2. Micardis a half tablet 40 mg daily.  3. Metoprolol 50 mg daily.  4. Potassium 20 mEq daily.  5. Sertraline 50 mg daily.  6. Lanoxin 0.25 mg daily.  7. Tikosyn 0.25 mg b.i.d.  8. Flexeril.  9. Aspirin.   Her EKG today demonstrates atrial pacing with ventricular sensing.   IMPRESSION:  1. Symptomatic atrial fibrillation.  2. Symptomatic bradycardia, status post pacemaker insertion.  3. Tikosyn therapy.  4. Hypertension.   DISCUSSION:  The patient is stable.  She continues not to be on  Coumadin, as she had side effects with this drug in the past.  I will  plan to see her back in the office in one year.  She will continue  following up in our [facility]Link program.   ADDENDUM:  I have asked that she take additional beta blocker should she  feel her heart racing more than for five or ten minutes at a time.     Doylene Canning. Ladona Ridgel, MD  Electronically Signed    GWT/MedQ  DD: 03/12/2007  DT:  03/13/2007  Job #: [id number]

## 2010-10-04 NOTE — Discharge Summary (Signed)
[name], BOHALL              ACCOUNT NO.:  [id number]   MEDICAL RECORD NO.:  [id number]          PATIENT TYPE:  INP   LOCATION:  3729                         FACILITY:  [facility]   PHYSICIAN:  Valerie A. Felicity Holmes, MDDATE OF BIRTH:  1926/12/06   DATE OF ADMISSION:  10/07/2006  DATE OF DISCHARGE:  10/11/2006                               DISCHARGE SUMMARY   DISCHARGE DIAGNOSES:  1. Status post fall with loss of consciousness, rule out syncope.  2. Chronic atrial fibrillation.  3. Hypertension.  4. Question of early dementia.  5. L3 compression fracture.  6. Diabetes type 2.  7. Mild hyponatremia.  8. Hypothyroid.   HISTORY OF PRESENT ILLNESS:  [name] is a 77 year old female who was  admitted on Oct 07, 2006, status post fall with head trauma and loss of  consciousness.  The patient is a 77 year old white female with a known  history of atrial fibrillation, hypertension and coronary artery disease  who presented to the emergency department after a fall off of her toilet  which resulted in a brief loss of consciousness.  She stated that she  was in her usual state of health but noted acute on chronic dizziness on  the day of admission.  She apparently was in the bathroom and tried to  reach for her dog, became dizzy, lost her footing and fell.  Apparently  lost consciousness and EMS was called.  She was admitted for further  evaluation and treatment.   PAST MEDICAL HISTORY:  1. Atrial fibrillation.  2. Coronary artery disease.  3. Diabetes type 2.  4. Hypertension.  5. Hyperlipidemia.  6. Hypothyroidism.  7. Anxiety.  8. Irritable bowel syndrome.  9. Sick sinus syndrome status post pacemaker placement 2003.  10.Benign essential tremor.   HOSPITAL COURSE:  PROBLEM #1 - STATUS POST FALL WITH LOSS OF  CONSCIOUSNESS, RULE OUT SYNCOPE:  The patient was admitted and a CT of  the head was performed which showed no acute changes.  It did note some  spondylosis in the  C-spine.  Abdominal CT noted an L3 compression  fracture and a small hiatal hernia.  She was placed on telemetry and an  EP consult was requested.  The patient was seen by Dr. Lewayne Bunting.  He  recommended discontinuing Coumadin, placing the patient on aspirin.  In  addition, he suggested allowing the blood pressure to run on the high  side and suggested stopping Cardizem.  The patient has had no further  episodes during this admission.  She was seen by physical therapy during  this admission who recommended home health physical therapy as well as  an aide.   PROBLEM #2 -  L3 COMPRESSION FRACTURE:  Incidentally, the patient was  noted to have an L3 compression fracture.  Her pain has been reasonably  controlled with Vicodin and she has been able to ambulate with physical  therapy.  Continue conservative management for now, however, may  consider kyphoplasty if pain worsens as an outpatient.   PROBLEM #3 - QUESTION EARLY DEMENTIA:  The family raised some concerns  question of some dementia.  May consider addition of Aricept or Namenda.  Will defer to the patient's primary MD.   DISCHARGE MEDICATIONS:  1. Tikosyn 250 mg p.o. b.i.d.  2. Zoloft 50 mg p.o. daily.  3. Amaryl 2 mg p.o. daily.  4. K-Dur 20 mEq p.o. daily.  5. Toprol XL 50 mg p.o. daily.  6. Micardis 40 mg p.o. daily.  7. Digoxin 0.25 mg p.o. daily.  8. Mirapex 0.125 mg p.o. daily in the evening.  9. Synthroid 75 mcg p.o. daily.  10.Aspirin 325 mg p.o. daily.  11.Vicodin 5/500 1 tablet p.o. q.6h. p.r.n. pain.   PERTINENT DISCHARGE LABORATORY DATA:  Urine culture negative.  Hemoglobin 12.1, hematocrit 35.3.  BUN 19, creatinine 1.08.   DISPOSITION:  The patient will be discharged to home.   FOLLOW UP:  The patient is scheduled to follow up with Dr. Birdie Sons  on Tuesday Oct 16, 2006, at 11:50 a.m.      Sandford Craze, NP      Maria Rover. Felicity Coyer, MD  Electronically Signed    MO/MEDQ  D:  10/11/2006   T:  10/11/2006  Job:  [id number]   cc:   [name], MD

## 2010-10-07 ENCOUNTER — Encounter: Payer: Self-pay | Admitting: Internal Medicine

## 2010-10-07 ENCOUNTER — Ambulatory Visit (INDEPENDENT_AMBULATORY_CARE_PROVIDER_SITE_OTHER): Payer: Medicare Other | Admitting: Internal Medicine

## 2010-10-07 VITALS — BP 138/68 | HR 84 | Ht 65.0 in | Wt 166.8 lb

## 2010-10-07 DIAGNOSIS — I509 Heart failure, unspecified: Secondary | ICD-10-CM

## 2010-10-07 DIAGNOSIS — Z950 Presence of cardiac pacemaker: Secondary | ICD-10-CM

## 2010-10-07 DIAGNOSIS — I4891 Unspecified atrial fibrillation: Secondary | ICD-10-CM

## 2010-10-07 NOTE — Discharge Summary (Signed)
NAMEJONASIA, [name]                          ACCOUNT NO.:  [id number]   MEDICAL RECORD NO.:  [id number]                   PATIENT TYPE:  INP   LOCATION:  2018                                 FACILITY:  [facility]   PHYSICIAN:  Richard A. Alanda Amass, M.D.          DATE OF BIRTH:  [birth year]   DATE OF ADMISSION:  01/02/2002  DATE OF DISCHARGE:  01/05/2002                                 DISCHARGE SUMMARY   ADMISSION DIAGNOSES:  1. Atrial fibrillation/atrial flutter, newly diagnosed.  2. History of chronic intermittent palpitations.  3. Hypertension.  4. Adult onset type 2 diabetes mellitus.  5. Questionable lipid status.  6. Chronic tremor.  7. History of diverticulosis.   DISCHARGE DIAGNOSES:  1. Atrial fibrillation/atrial flutter, newly diagnosed.  Status post     conversion to sinus rhythm with Rhythmol.  Also had a significant pause     at the time of conversion to sinus rhythm.  2. History of chronic intermittent palpitations.  3. Hypertension.  4. Adult onset type 2 diabetes mellitus.  5. Questionable lipid status.  6. Chronic tremor.  7. History of diverticulosis.   HISTORY OF PRESENT ILLNESS:  The patient is a 77 year old white widowed  female who was admitted on 01/02/02, for new onset of atrial fibrillation and  rapid ventricular response for several hours duration.  For several hours  she had been experiencing weakness, shortness of breath, and anxiety, as  well as palpitations, and then came to the ER via EMS.   She was a previous patient of Dr. Cecille Rubin and had been on long-term  Inderal therapy for 30 years secondary to episodic palpitations, but had no  history of known atrial fibrillation.  In the past, her Inderal had been  stopped, and her symptoms of palpitations increased.  Apparently, she had a  Holter monitor done recently in our office, but was not evaluated by one of  our physicians there.  She had since transferred her care to Dr. Cato Mulligan and  had  been treated with Toprol XL 100 mg q.d. along with antihypertensive  therapies.  She had been experiencing some frequent short-lived palpitations  at times of increasing anxiety and stress.   Again at this time of admission, she had increased amount of stress at home,  as she had been having to wait for a new refrigerator and had been doing her  husband's driving.  Her symptoms then began at about 5:00, and she then came  to the emergency room by 7 p.m.  In the emergency room, she was then treated  with an IV Cardizem bolus and drip, and rate decreased to 90 beats per  minute, but continued with atrial fibrillation.  There were no other acute  changes or ischemic changes.  She was having no chest pain, had no known  history of coronary artery disease.   PHYSICAL EXAMINATION:  GENERAL:  She did appear to be anxious and  had a  bilateral tremor.  VITAL SIGNS:  She was afebrile, blood pressure was 140/80, she remained in  atrial fibrillation at about 90 beats per minute.  No other significant  findings on examination.   At that time, she was seen and evaluated by Dr. Susa Griffins.  It was  felt that she was in atrial fibrillation, unknown exact duration.  Probable  less than six hours onset given her palpitations.  We plan to treat now with  IV heparin, rate control, continue beta blocker, recheck thyroid functions.  We had planned for a TE cardioversion the following day if she had not  converted.  As well, he placed her on propafenone at this point, and planned  for Coumadin anticoagulation as well.   HOSPITAL COURSE:  On the following morning on 12/24/01, she was given  additional Rhythmol dose.  We were in the process of scheduling her for a TE  cardioversion, but she converted to sinus rhythm on her own with the  Rhythmol.   Actually at the time that she went from atrial fibrillation to sinus she had  a significant pause of approximately 6.5 seconds.  At that time she also did   have a syncopal episode at the time of the pause.  After the pause, she then  went to sinus bradycardia/sinus rhythm.  At that time, the Cardizem was  stopped and the other medications were continued.   On the evening of 01/03/02, she had a 6.65 second pause, and then after that,  about one hour after, she did have a 3.55 second pause.  Otherwise, she had  no further pauses.  She maintained sinus rhythm at about 60 beats per minute  and had no further atrial fibrillation or atrial flutter.   When she was seen on 01/04/02, we planned to continue to monitor her rhythm.  At this point she was off of Cardizem and Toprol, but was on Rhythmol.  As  well, digoxin was added for further AV nodal blockade.  Because she had  uncontrolled hypertension, we planned to restart the Toprol, but hold off on  digoxin and Cardizem.   On 01/05/02, the patient remained stable.  She remained in normal sinus  rhythm at 60 to 74 beats per minute with no further pauses and no further  arrhythmia.  Temperature was 97.1, pulse 60 to 74, blood pressure 135/80,  oxygen saturation 98% on room air.  INR was therapeutic at 2.1.  No other  significant findings on physical examination.   At this time she was seen and evaluated by Dr. Jeanella Cara who deems her stable  for discharge home.   CONSULTATIONS:  None.   PROCEDURE:  None.   LABORATORY DATA:  TSH normal at 3.609, T4 6.6, T3 192.0.  Cardiac enzymes  negative with CK 132, MB 1.8, troponin-I 0.03.  Liver function tests were  normal.  Sodium 136, potassium 3.7, chloride 101, CO2 27, glucose 128, BUN  15, creatinine 0.8.  On admission, PT was 14.1, INR 1.1, PTT 88 on IV  heparin.  Followup INR was 1.2, and then at time of discharge INR was up to  2.1 which is therapeutic.  On admission, white blood cell count was 10.6,  hemoglobin 13.4, hematocrit 39.8, platelets 190.  These all remained stable with no significant variation.   Radiology:  Chest x-ray shows  cardiomegaly with no active disease.   EKG on admission on 01/02/02, showed atrial fibrillation, 139 beats per  minute, nonspecific ST-T  change with rate.  Repeat EKG once she was on IV  Cardizem again on 01/02/02, shows atrial fibrillation, 97 beats per minute,  nonspecific ST-T change.  EKG on 01/03/02, shows sinus bradycardia, 51 beats  per minute, no significant ST-T change.  Then again on 01/04/02, shows sinus  rhythm at 66 beats per minute, no ST-T chance.  Telemetry strips were  notable for:  On 01/03/02, at 10:29, she had a 6.65 second pause, and on  01/03/02, at 11:49, she had a 3.55 second pause.   DISCHARGE MEDICATIONS:  1. Toprol XL 25 mg one q.d.  2. Rhythmol 150 mg t.i.d.  3. Micardis/HCT 80/12.5 mg q.d.  4. K-Dur 10 mEq q.d.  5. Aspirin 81 mg q.d.  6. Primidone 50 mg two tablets q.h.s.  7. Hyoscyamine 0.375 mg b.i.d. p.r.n.  8. Valium 10 mg q.h.s. p.r.n.  9. Coumadin 2.5 mg once q.d. as directed.   DIET:  No caffeine diet.   DISCHARGE INSTRUCTIONS:  1. Have blood drawn on Tuesday to check your PT/INR, make sure to obtain     results by Wednesday.  2. On Monday, call [phone] and make an appointment to see Dr. Jacinto Halim in two     weeks.         Mary B. Easley, P.A.-C.                   Richard A. Alanda Amass, M.D.    MBE/MEDQ  D:  01/21/2002  T:  01/21/2002  Job:  [id number]   cc:   Gerlene Burdock A. Alanda Amass, M.D.  [phone] [address]  [place]  [place] [address]  Fax: [phone]   [name], M.D. [facility] R. Jacinto Halim, M.D.

## 2010-10-07 NOTE — H&P (Signed)
[name], [name]              ACCOUNT NO.:  [id number]   MEDICAL RECORD NO.:  [id number]          PATIENT TYPE:  INP   LOCATION:                               FACILITY:  [facility]   PHYSICIAN:  Madaline Savage, M.D.DATE OF BIRTH:  [birth year]   DATE OF ADMISSION:  04/28/2004  DATE OF DISCHARGE:                                HISTORY & PHYSICAL   CHIEF COMPLAINT:  Atrial fibrillation.   HISTORY OF PRESENT ILLNESS:  A 77 year old white female with history of  paroxysm atrial fibrillation called our office today asking to be seen  secondary to rapid atrial fibrillation.  She stated her heart was out of  rhythm.  On arrival here to our office, she is indeed in atrial fibrillation  with a rapid ventricular response.  Heart rate 122.  In addition to this,  she felt as if she would pass out several times, the last being on our way  to the office this morning.   She was originally seen in our office back in 2001 for palpitations.  She  had been placed on Toprol, was stable, and then was not seen again by [place]  until September of 2003.  At that time, she had been diagnosed with atrial  fibrillation and sick sinus syndrome.  Had been in the hospital actually at  [facility] January 02, 2002 for atrial fibrillation.  She  was placed on Rythmol at that time and had long pauses when she converted to  sinus rhythm.  Her chronotropics had been cut back and she was stable at the  time of discharge and was maintaining sinus rhythm.  She had a Cardiolite  study in 2003 that was negative for ischemia with normal LV function.   She continued to be stable on Rythmol for several months until November of  2003 and she was admitted to [facility] for breakthrough  atrial fibrillation.  At that point, her Rythmol was discontinued and  amiodarone was added.  She had significant bradycardia, and at that time,  she had with consult Dr. Duke Salvia or Dr. Doylene Canning. Ladona Ridgel,  one of the  ET physicians, she had Medtronic Kappa pacemaker implanted by Dr. Darlin Priestly on April 28, 2002.  She remained on amiodarone 400 mg daily and  Toprol XL 25 mg q.d.   She continued to be stable with pacemaker checks by Dr. Darlin Priestly  episodically.   Unfortunately, in May of 2005, the patient complained of brightness in her  eyes, concerned that it was due to the amiodarone and the amiodarone was cut  back to 100 mg q.d.   By October 2005, the patient complained of photosensitivity, more so with  the amiodarone and it was discontinued.  Also, at that point, she was on  Toprol, Lanoxin, and had breakthrough atrial fibrillation when on Cardizem  CD.  By March 31, 2004, her Lanoxin was discontinued due to diarrhea.  The patient stated she felt the Lanoxin was causing diarrhea.   She did have a pacemaker interrogation on March 31, 2004  secondary also  to a fall felt to be related to orthostatic hypotension.   PAST MEDICAL HISTORY:  1.  Tremor.  2.  Hypertension.  3.  Adult onset diabetes mellitus type 2, diet controlled.  4.  Hypothyroidism.  5.  Diverticulosis.  6.  Anxiety.  7.  Depression.   ALLERGIES:  CODEINE.  Intolerance to LANOXIN causing diarrhea, AMIODARONE  causes photosensitivity.  She has not been intolerant to [facility] but she had  breakthrough atrial fibrillation on this.   CURRENT MEDICATIONS:  1.  Zoloft 50 mg 1/2 q.d.  2.  Toprol XL 25 mg 2 q.d.  3.  Valium 10 mg 1/2 q.h.s.  4.  Cardizem CD 120 mg q.d.  5.  Lanoxin has been discontinued.  6.  Coumadin 2.5 mg q.d. except for 1.25 mg on Wednesday and Sunday.  Her      last INR on April 26, 2004 was 2.84.  7.  Micardis 80 mg q.d.  8.  Synthroid 0.1 mg q.d.  9.  Primidone 50 mg q.d. that is for her tremor.  10. Hyoscine p.r.n.  11. Meclizine 25 mg p.r.n.   FAMILY HISTORY:  Not significant to this admission.   REVIEW OF SYMPTOMS:  GENERAL:  No recent weight loss or gains.   No colds or  fevers.  MUSCULOSKELETAL:  Denies claudication or joint pain.  She does have  a tremor.  SKIN:  Without rashes or ulcers.  HEENT:  No visual changes.  RESPIRATIONS:  Short of breath.  With the atrial fibrillation is when she  feels it; she just feels breathless.  CARDIOVASCULAR:  No chest pain.  No  edema.  GI:  No indigestion, diarrhea, or constipation.  Some nausea.  GU:  No dysuria or hematuria.  ENDOCRINE:  Diet controlled diabetes.  Positive  for hypothyroidism.  NEUROLOGICAL:  Lightheaded.  Has almost felt she was  going to pass out several times in the last two days.  PSYCHIATRIC:  Has a  history of anxiety, depression.   SOCIAL HISTORY:  She is widowed with four children.  Her daughter is with  her here today.  She has not exercised and does not use tobacco or alcohol.   PHYSICAL EXAMINATION:  VITAL SIGNS:  Initially blood pressure 100/60, on  recheck 120/70.  Weight 172 which was down from 179 on her last office  visit.  Height 5 feet 6 inches.  Heart rate 122.  GENERAL:  Alert, oriented, easily distracted, and anxious white female  clammy to touch.  SKIN:  Warm and clammy.  Brisk capillary refill.  HEENT:  Sclerae are clear.  NECK:  Supple. No JVD.  No bruits.  LUNGS:  Clear without rales, rhonchi, or wheezes.  HEART:  Regularly irregular without gallop or murmur.  No rub noted.  ABDOMEN:  Soft and nontender.  Positive bowel sounds x 4 quadrants.  Cannot  palpate liver, spleen, or masses.  EXTREMITIES:  With no significant edema.  Pedal pulses are 2+.  There are 2+  radials bilaterally.   ASSESSMENT:  1.  Atrial fibrillation with rapid ventricular response, recurrent.  2.  Sick sinus syndrome with permanent pacemaker implanted in 2003 by Dr.      Darlin Priestly.  3.  Hypothyroidism.  4.  Diet controlled diabetes mellitus.  5.  Breakthrough atrial fibrillation on Rythmol.  The patient is intolerant     to amiodarone due to photosensitivity.  Lanoxin has  caused diarrhea.      Currently  on Toprol and Cardizem with again breakthrough atrial      fibrillation.  6.  Anxiety/depression.   PLAN:  Dr. Madaline Savage saw the patient with me.  We discussed adding  amiodarone back 400 mg twice a day at home but the patient, with her near  syncope episodes, lives alone, and her daughter is unable to stay with her,  it was felt to be safer for the patient to admit her to [facility].  Continue her Coumadin.  Begin IV Cardizem drip and have Dr. Janeece Riggers. Peele see her for further EP management, possibly ablation.       LRI/MEDQ  D:  04/28/2004  T:  04/28/2004  Job:  [id number]   cc:   Madaline Savage, M.D.  [address], [address]  [place]  [place] [address]  Fax: [phone]   [name], M.D. [facility]

## 2010-10-07 NOTE — Assessment & Plan Note (Signed)
[facility]                           ELECTROPHYSIOLOGY OFFICE NOTE   HOLDEN, DRAUGHON                     MRN:          [id number]  DATE:02/07/2006                            DOB:          19-Nov-1926    Ms. [name] returns today for follow up.  She is a very pleasant woman with  persistent atrial fibrillation and symptomatic tachybrady syndrome who  underwent permanent pacemaker insertion several years ago.  She initially  was on amiodarone but developed vision problems with this and had to be  stopped.  She subsequently was placed on Tikosyn therapy and has maintained  sinus rhythm very nicely since then.  She returns for follow up.  She denies  chest pain or shortness of breath and had no specific cardiac complaints.  The lungs are clear bilaterally to auscultation.  There are no wheezes,  rales or rhonchi.  The cardiovascular exam revealed a regular rate and  rhythm with normal S1 and S2.  The extremities demonstrated no clubbing,  cyanosis, or edema.   EKG demonstrated sinus rhythm with atrial pacing and appropriate ventricular  sensing.   Interrogation of her pacemaker demonstrates Medtronic Kappa with P&R waves  of 2 and 4.  The pacing threshold was 0.75 at 0.4 in both the atrium and the  ventricle.  The impedance was 517 in the atrium, 575 in the ventricle.   IMPRESSION:  1. Persistent atrial fibrillation.  2. Symptomatic bradycardia.  3. Chronic Tikosyn therapy.  4. Chronic amiodarone therapy.   DISCUSSION:  Overall Ms. [name] is stable and her pacemaker is working  normally and she is maintaining sinus rhythm very nicely.  We will plan on  continuing Tikosyn.                                   Doylene Canning. Ladona Ridgel, MD   GWT/MedQ  DD:  02/07/2006  DT:  02/09/2006  Job #:  [id number]   cc:   [name], MD

## 2010-10-07 NOTE — Cardiovascular Report (Signed)
NAMEJNIYA, MADARA              ACCOUNT NO.:  [id number]   MEDICAL RECORD NO.:  [id number]          PATIENT TYPE:  INP   LOCATION:                               FACILITY:  [facility]   PHYSICIAN:  Mark E. Severiano Gilbert, M.D.    DATE OF BIRTH:  1927-02-01   DATE OF PROCEDURE:  DATE OF DISCHARGE:                              CARDIAC CATHETERIZATION   PROCEDURES PERFORMED:  External cardioversion.   PREPROCEDURE DIAGNOSIS:  Atrial fibrillation, persistent rapid ventricular  response.   POSTPROCEDURE DIAGNOSIS:  Atrial fibrillation, persistent rapid ventricular  response.   OPERATOR:  Launa Grill, M.D.   COMPLICATIONS:  None.   PROCEDURE IN DETAIL:  Patient was on the telemetry ward in a fasting state.  Patient received anesthesia 100 mg IV Propofol for sedation.  Single  synchronized 150 joule shock delivered externally through anterior/posterior  applied pads.  Successful cardioversion of atrial fibrillation to normal  sinus rhythm with occasional PVCs after single defibrillating shock.  The  patient rapidly recovered from anesthesia with a nonfocal motor/sensory  examination and normal mental status.        ___________________________________________  Janeece Riggers Severiano Gilbert, M.D.    MEP/MEDQ  D:  05/02/2004  T:  05/02/2004  Job:  [id number]

## 2010-10-07 NOTE — Patient Instructions (Signed)
Your physician wants you to follow-up in: 6 months with Dr Taylor You will receive a reminder letter in the mail two months in advance. If you don't receive a letter, please call our office to schedule the follow-up appointment.  

## 2010-10-07 NOTE — Discharge Summary (Signed)
[name], [name]              ACCOUNT NO.:  [id number]   MEDICAL RECORD NO.:  [id number]          PATIENT TYPE:  INP   LOCATION:  4733                         FACILITY:  [facility]   PHYSICIAN:  Doylene Canning. Ladona Ridgel, M.D.  DATE OF BIRTH:  [birth year]   DATE OF ADMISSION:  07/26/2004  DATE OF DISCHARGE:  07/30/2004                                 DISCHARGE SUMMARY   DISCHARGE DIAGNOSES:  1.  History of recurrent atrial fibrillation, having failed amiodarone      therapy.  2.  Admitted March 7 for Tikosyn therapy, converted to sinus rhythm 3 hours      after initial  Tikosyn dosing.  3.  Impaired glucose tolerance with fasting glucose values on March 8 of      186; March 9, 176; hemoglobin A1C 7.7.  Seen in consult by internal      medicine and started on Amaryl 2 mg daily.  4.  Diverticulosis/irritable bowel syndrome.  Had abdominal cramping here.      Started by internal medicine on Levsin 0.125 mg every 4 hours as needed      for abdominal cramping.  5.  Digoxin reduced from 0.25 mg to 0.125 mg daily.  6.  Coumadin therapy increased from 2.5 mg Monday, Wednesday, and Friday to      2.5 mg Tuesday, Thursday, Saturday, and Sunday and 1.25 mg Monday,      Wednesday, and Friday.   SECONDARY DIAGNOSES:  1.  Recurrent atrial fibrillation status post direct current cardioversion.      status post amiodarone therapy, status post Rythmol therapy.  2.  Fatigue, weakness, dyspnea on exertion, with atrial fibrillation.  3.  Sick sinus syndrome status post pacemaker implantation, Medtronic Kappa      KDR 801 implanted November 2003.  4.  Hypertension.  5.  Diabetes mellitus on no medical therapy prior to admission.  6.  Hypothyroidism.  7.  Diverticulosis/irritable bowel syndrome.  8.  Anxiety and depression.   PROCEDURES:  No procedures this admission except for initiation of Tikosyn  therapy.   CONSULTATIONS:  Internal medicine consult providing two essential functions:  1.  Assessment of  impaired glucose tolerance with appropriate dosing on      discharge.  2.  Medication therapy for irritable bowel syndrome as needed.   DISPOSITION:  [name] discharges March 11, Hospital day #5, after  undergoing Tikosyn therapy, but her QTC has not become more prolonged than  400 msec; her maximum allowable according to baseline QTC prior to  medication therapy would be 408.  Fasting glucose measurements have been  elevated.  On March 8, they were 186; on March 9, 176, this despite a  carbohydrate-modified diet.  The patient was seen by internal medicine and  prescribed Amaryl 2 mg daily.  Her serum potassium has stayed greater than 4  throughout this hospitalization but has required potassium supplementation.  She will go home on potassium 20 mEq daily.   DISCHARGE MEDICATIONS:  1.  Amaryl 2 mg daily.  2.  Levsin 0.125 mg every 4 hours as needed for abdominal cramping.  3.  A new medication, Tikosyn 250 mcg daily to keep dosing at least 12 hours      apart.  [name] agrees to 8:30 in the morning and 8:30 in the      evening.  4.  Lanoxin 0.125 mg daily; this is 1/2 of a 0.25 mg tablet.  This is a new      dose.  5.  Diltiazem XR 180 mg daily.  6.  Toprol XL 50 mg daily.  7.  Zoloft 50 mg daily.  8.  Synthroid 100 mcg daily.  9.  A new dose of Coumadin will be 2.5 mg daily Tuesday, Thursday, Saturday,      and Sunday; and 1/2 tablet Monday, Wednesday, and Friday.  10. Micardis 80 mg daily.  11. Potassium chloride 20 mEq daily.   DIET:  She is to maintain low-sodium, low cholesterol, carbohydrate-modified  diet.   FOLLOW UP:  1.  She will see Dr. Ladona Ridgel at [facility] office in two weeks.  His      office will call with that appointment.  2.  She is to check her blood sugars 4 times a day before meals and at      bedtime until she sees Dr. Cato Mulligan.  She is to have an appointment with      Dr. Cato Mulligan at the end of the week.  She is to call for that appointment.   3.  She will be called for an appointment at the [facility]      [facility]. This will be set up for Tuesday, March 14.  Dr. Lubertha Basque office      will call with that appointment.   BRIEF HISTORY:  [name] is a 77 year old female with a history of  persistent atrial fibrillation.  With atrial fibrillation, she becomes  extremely fatigued, weak, as well as having dyspnea.  She has been on  Rythmol in the past and failed that.  She had been on amiodarone then  following Rythmol and had photophobia and photosensitivity which caused  discontinuing of the drug.  She was seen by Dr. Severiano Gilbert of [facility]  and Vascular at which time A-V node was recommended.  The patient did refuse  this.  She subsequently underwent direct current cardioversion which  restored transient sinus rhythm; however, she redeveloped atrial  fibrillation and returns to see Dr. Ladona Ridgel March 2.  She has pacemaker which  is a Medtronic Kappa, and this was interrogated at the office.  This shows  that her ventricular rate has been actually fairly well controlled with an  average heart rate of 90 to 95 beats per minute.  Only 5% of her heart rate  was between 110 and 120 beats per minute.  The options available to Ms.  Holmes have been discussed.  They include rate control with Cardizem and  beta blockers and digoxin.  The second is to proceed with A-V node ablation  which would allow her to discontinue Cardizem, beta blocker, and digoxin.  The third is to admit the patient for initiation of Tikosyn therapy.  Electrocardiogram has been reviewed to check the QT interval.  This seems to  be within normal limits.  The options have been discussed with the patient,  and she elects for admission for Tikosyn therapy.   HOSPITAL COURSE:  The patient was admitted March 7 to begin Tikosyn therapy.  Initial laboratory studies show that she was deficient in both potassium and magnesium, and these were replenished.  Tikosyn therapy actually started on  March 8, and 3 hours after her first dose, she converted to sinus rhythm.  She maintained sinus rhythm throughout the hospital stay, was dosed on  Tikosyn at 12-hour intervals.  Her QTC did not become prolonged.  She was  found to have fasting blood glucose elevations, and she was seen by internal  medicine and treated appropriately with Amaryl with suggestion to follow up  with Dr. Cato Mulligan and also use her one-touch Glucometer, to keep a log.  She  was also given Levsin for abdominal cramping secondary to  diverticulosis/IBS.  The patient has had potassium supplementation  throughout this hospitalization and will go home on potassium  supplementation.  Her digoxin level has been reduced from 0.25 to 0.125, and  her Coumadin, which was causing her anticoagulation levels to fall, has been  reversed so that she gets 2.5 mg on Tuesday, Thursday, Saturday, and Sunday  and 1/2 tablet or 0.125 mg on Monday, Wednesday, and Friday.   LABORATORY DATA AND OTHER STUDIES:  Laboratory studies here:  On March 10,  metabolic panel showed Sodium 127, potassium 4.1, chloride 97, carbonate 24,  glucose 192, and this was at 5:30 in the morning.  BUN is 9, creatinine 0.8.  PT on March 10 was 18.2, INR 1.8.  Complete blood count:  White cells 3.9,  hemoglobin 12.2, hematocrit 35.0, platelets 121.  INR throughout this  hospitalization:  March 7, 2.0; March 8, 1.9; March 9, 1.7; March 10, 1.8.  The patient was given an extra 2 mg of Coumadin on March 10.  Her fasting  blood glucose throughout this hospitalization is as follows, on March 8 at 4  o'clock in the morning, 186; March 9 at 3 o'clock in the morning, 176; March  10 at 5:30 in the morning, 192.  Hemoglobin A1C was 7.7.      GM/MEDQ  D:  07/29/2004  T:  07/29/2004  Job:  [id number]   cc:   [name], M.D. [facility]

## 2010-10-07 NOTE — Discharge Summary (Signed)
[name], [name]              ACCOUNT NO.:  [id number]   MEDICAL RECORD NO.:  [id number]          PATIENT TYPE:  INP   LOCATION:  2030                         FACILITY:  [facility]   PHYSICIAN:  Madaline Savage, M.D.DATE OF BIRTH:  02/07/1927   DATE OF ADMISSION:  04/28/2004  DATE OF DISCHARGE:  05/03/2004                                 DISCHARGE SUMMARY   DISCHARGE DIAGNOSES:  1.  Atrial fibrillation status post TEE Cardioversion this admission to      sinus.  2.  History of PT VDP in 2003 by Dr Jenne Campus.  3.  Treated hypothyroidism.  4.  Non-insulin-dependent diabetes, diet controlled.  5.  Anxiety and depression.   HOSPITAL COURSE:  The patient is a 77 year old female with history of PAF.  She was seen April 28, 2004, with recurrent atrial fibrillation.  She does  not tolerate this well.  She has been intolerant to previous medications and  had breakthrough on Rhythmol in the past.  She had a Medtronic pacemaker  implanted by Dr. Jenne Campus April 28, 2002.  She had to stop Amiodarone in  May of 2005 because of photosensitivity.  The patient was admitted to  telemetry and continued on Coumadin and started on IV Cardizem.  She was  seen in consult by Dr. Severiano Gilbert.  Initially, the patient declined Amiodarone  resumption or further antiarrhythmic therapy.  Her beta blocker and Cardizem  were increased.  She ultimately agreed to proceed with a TEE Cardioversion  which was done May 02, 2004, by Dr. Severiano Gilbert.  She is discharged May 03, 2004, in sinus rhythm.  She will follow up with Dr. Jacinto Halim in a couple of  weeks.   DISCHARGE MEDICATIONS:  1.  Cardizem CD 180 mg daily.  2.  Toprol XL 100 mg daily.  3.  Micardis 80 mg daily.  4.  Synthroid 0.1 mg daily.  5.  Mysoline 50 mg daily.  6.  Zoloft 25 mg q.h.s.  7.  Coumadin 2.5 mg daily with 1.25 mg on Monday, Wednesday, Friday.  8.  Tussionex p.r.n. for cough.   LABORATORY DATA:  INR 2.4.  Most recent hematology profile shows  a white  count of 4.7, hemoglobin 13.7, hematocrit 39.8, platelets 144,000.  Sodium  133, potassium 3.9, BUN 16, creatinine 0.9.  CK and troponins are negative.  Magnesium 2.0.  Hemoglobin A1C is 6.7.  TSH 1.102.  Chest x-ray shows  cardiomegaly without acute process.   DISPOSITION:  The patient is discharged in stable condition and will follow  up with Dr. Jacinto Halim May 24, 2004.  She will have a pro time done next week.       LKK/MEDQ  D:  05/03/2004  T:  05/03/2004  Job:  [id number]   cc:   [name], M.D. [facility]

## 2010-10-07 NOTE — Discharge Summary (Signed)
[name], [name]                          ACCOUNT NO.:  [id number]   MEDICAL RECORD NO.:  [id number]                   PATIENT TYPE:  INP   LOCATION:  2030                                 FACILITY:  [facility]   PHYSICIAN:  Cristy Hilts. Jacinto Halim, M.D.                  DATE OF BIRTH:  [birth year]   DATE OF ADMISSION:  04/20/2002  DATE OF DISCHARGE:  04/30/2002                                 DISCHARGE SUMMARY   DISCHARGE DIAGNOSES:  1. Paroxysmal atrial fibrillation and sick sinus syndrome status post     initiation of amiodarone and permanent pacemaker this admission.  2. Hypertension, controlled.  3. Noninsulin-dependent diabetes, diet controlled.   HOSPITAL COURSE:  The patient is a 77 year old female followed by Dr. Cato Mulligan  and Dr. Jacinto Halim with a history of palpitations and PAF and sick sinus  syndrome.  She was admitted April 20, 2002.  She had been having  palpitations.  She also felt significantly fatigued.  She had had some  documented post-termination pauses going in and out of atrial fibrillation.  She was admitted to telemetry and her Toprol was increased.  Previous  Cardiolite done in September 2003 showed no ischemia with good LV function,  previous echocardiogram in September 2003 showed normal LV function.  Prior  to admission the patient was on Rythmol 225 q.8.  The patient was seen in  consult by the EP service.  Rhythmol was discontinued and amiodarone added  and beta blocker held.  She continued to have some bradycardia and fatigue  and ultimately it was decided to proceed with backup permanent pacemaker.  Her Coumadin was held and she was started on heparin when her INR dropped  below 2.  On April 28, 2002, she underwent implantation of a Medtronic  Kappa PTVDP by Dr. Jenne Campus.  She tolerated this well.  She is AV paced at  discharge with a rate of 52.  Coumadin is to be started May 01, 2002 as  well as baby aspirin.  She will have a prothrombin time 4 days after  Coumadin is started.  She will see [place] in the office in about 1 week for a  wound check.  We did cut her amiodarone back to 400 mg once a day at  discharge, this will need to be decreased further as an outpatient to 200 mg  a day.  Her pacemaker site at discharge is without hematoma.   DISCHARGE MEDICATIONS:  1. Coumadin 2.5 mg a day to start May 01, 2002 in the evening.  2. Aspirin 81 mg a day to start May 01, 2002.  3. Micardis 80 mg a day.  4. Toprol XL 25 mg a day.  5. Amiodarone 400 mg a day.  6. Mysoline 100 mg h.s.   LABORATORIES:  INR at discharge is 1.0.  White count 7.0, hemoglobin 13.1,  hematocrit 39.0, platelets 158.  INR on admission  was 2.0.  Chemistries show  a sodium 133, potassium 3.7, BUN 17, creatinine 1.0.  CK, MB, and troponins  were negative x3.  TSH 3.18.  Chest x-ray shows cardiomegaly; no CHF.  EKG  reveals sinus rhythm, sinus bradycardia, with a QTC of 415.    DISPOSITION:  The patient is discharged in stable condition and will have a  followup prothrombin time Monday, May 05, 2002 and see an extender in  our office for wound check May 09, 2002.  She will need to see Dr.  Jenne Campus for a pacemaker evaluation within 3 months, her amiodarone will also  need to be decreased.     Abelino Derrick, P.A.                      Cristy Hilts. Jacinto Halim, M.D.    Lenard Lance  D:  04/30/2002  T:  04/30/2002  Job:  [id number]   cc:   [name], M.D. [facility]  [address] [place]  [place] [address]  Fax: 1   Doylene Canning. Ladona Ridgel, M.D. [facility]  [address]  [place]  [place] [address]  Fax: 1

## 2010-10-07 NOTE — H&P (Signed)
NAMEJLA, REYNOLDS                          ACCOUNT NO.:  [id number]   MEDICAL RECORD NO.:  [id number]                   PATIENT TYPE:  INP   LOCATION:  2009                                 FACILITY:  [facility]   PHYSICIAN:  Lezlie Octave, N.P.                  DATE OF BIRTH:  May 17, 1927   DATE OF ADMISSION:  03/16/2002  DATE OF DISCHARGE:  03/19/2002                                HISTORY & PHYSICAL   HISTORY AND HOSPITAL COURSE:  Ms. [name] is a 77 year old female  patient with a history of paroxysmal atrial fibrillation.  She apparently  had been in the hospital 01/02/02, she had atrial flutter, she converted, she  apparently had long pauses while in the hospital.  She came to the emergency  room because of a one day history of rapid regular heart beat.  She was seen  by Dr. Julieanne Manson in the emergency room and her heart rate was in the  130's, atrial fibrillation.  She was given some IV Cardizem and it slowed  her heart rate down to 70 to 90.  Her medications have been Coumadin 2.5  every day, Rhythmol 150 mg every 8 hours, Toprol 25 every day, aspirin 81 mg  every day, K-Dur 10 mEq every day, Micardis 80 mg every day, Mysoline 100 mg  at bedtime for her tremors and Valium 10 mg at bedtime.  She was seen by Dr.  Julieanne Manson, her INR was 1.9 and he felt that she should be admitted.  He  increased her Rhythmol to 225 mg every eight hours and felt that she should  not have a Cardizem drip because she has apparently had a six second pause  at previous hospitalization.  The following day she apparently had not had  any pauses on 225 mg every eight hours of Rythmol along with her low dose  beta blocker, however it was felt that she should be watched another 24  hours to 48 hours.  She continued to be monitored.  She converted to a sinus  rhythm, her heart rate is 58-60.  On 03/19/02, she was seen by Dr. Nicki Guadalajara, her QTC was 436 milliseconds, she was sinus brady at 53  with no  pauses, and it was felt that she was stable to be discharged home.  Her  blood pressure was 150/78, pulse 58, respirations 20.  Room air O2  saturations were 93%, her INR was 3.4.   LABORATORY DATA:  Hemoglobin 12.5, hematocrit 38.1, platelets 178, WBC 6.6.  Sodium 134, potassium 3.7, glucose 185, BUN 14, creatinine 0.9, CK MB  negative x1, urine was greater than 100,000 colony count of sagalactiae.  X-  rays showed no evidence of cardiopulmonary disease.   DISCHARGE MEDICATIONS:  1. K-Dur 10 mEq q.d.  2. Aspirin 81 mg q.d.  3. Coumadin 2.5 mg q.d.  4. Mysoline 100 mg at bedtime.  5. Rhythmol 225 mg every 8 hours.  6. Toprol XL 25 mg q.d.  7. Micardis 80 mg q.d.  8. Celexa 10 mg q.d.  9. Valium 10 mg at bedtime when needed.   DISCHARGE ACTIVITY:  As tolerated.   FOLLOWUP:  She will follow up with Dr. Cristy Hilts. [place] in the office.   DISCHARGE DIAGNOSES:  1. Paroxysmal atrial flutter with rapid ventricular response, breakthrough     on Rhythmol 150 every eight hours.  2. History of a long pause, six seconds.  3.     Hypertension.  4. Urinary tract infection treated with Cipro while in the hospital.  5. Anticoagulation, therapeutic at time of discharge.                                               Lezlie Octave, N.P.    BB/MEDQ  D:  05/20/2002  T:  05/20/2002  Job:  [id number]

## 2010-10-07 NOTE — H&P (Signed)
NAMERENU, ASBY              ACCOUNT NO.:  [id number]   MEDICAL RECORD NO.:  [id number]          PATIENT TYPE:  EMS   LOCATION:  MAJO                         FACILITY:  [facility]   PHYSICIAN:  Cristy Hilts. Jacinto Halim, MD       DATE OF BIRTH:  August 26, 1926   DATE OF ADMISSION:  03/10/2004  DATE OF DISCHARGE:                                HISTORY & PHYSICAL   REFERRING PHYSICIAN:  Dr. Birdie Sons.   PRIMARY CARDIOLOGIST:  Dr. Yates Decamp.   CHIEF COMPLAINT:  Weakness.   BRIEF HISTORY:  The patient is a 77 year old white female with a history of  PAF and sick sinus syndrome dating back to 2003.  She returned yesterday to  the office and was found to be back in atrial fibrillation.  She had  previously been on amiodarone and was in sinus rhythm.  It was discontinued  because of problems with photophobia.  She was placed on digoxin.  Her  Toprol-XL was increased in the office yesterday.  Today, a home health nurse  made her monthly visit and called with reports of increased heart rate.  The  patient has complained of weakness, dizziness, and nausea.  Her blood  pressure at home by the home health nurse was 140/unknown diastolic.   The patient was sent to the emergency room for evaluation.  Currently, she  is in atrial fibrillation with her heart rate between 90 and 110.  Her blood  pressure is 130/70 supine, 125/69 sitting, and 120/70 standing.  She  complained of being dizzy and ready to pass out while standing for her blood  pressure.  She did not pass out, nor was she more unstable than usual.  She  was seen by Dr. Jacinto Halim in the ER.  She is very anxious and nervous.  He plans  to admit her for 24-hour observation, discharge her home tomorrow.  Will  initiate Cardizem along with her beta blocker and her digoxin today while  she is in the hospital.   PAST MEDICAL HISTORY:  1.  PAF/sick sinus syndrome/permanent transvenous pacemaker April 20, 2002.  2.  Hypertension.  3.  Reported  diet-controlled diabetes.  4.  Hypothyroidism.  5.  Diverticulosis.  6.  History of anxiety and depression.   ALLERGIES:  She has previously listed CODEINE but notes that she had  pneumonia and could use it in cough syrups this last fall.   ETOH:  None.  Cigarettes:  None.  Drugs:  None.   SOCIAL HISTORY:  She is widowed, two sons are deceased - one with diabetes,  one with a stroke.   She is intolerant to CRESTOR, other STATINS, and AMIODARONE.   REVIEW OF SYSTEMS:  CV:  She is complaining of weakness, dizziness, and  wanting to pass out.  PULMONARY:  She had no PND nor orthopnea last night,  was able to sleep through the night without difficulty.  CARDIAC:  No chest  pain.  She can probably feel some palpitations.  GI:  Positive for nausea;  otherwise negative.  GU:  Negative.  LOWER EXTREMITIES:  She has had no  edema.   CURRENT MEDICATIONS:  1.  Coumadin 1.25 mg Wednesday and Sunday and 2.5 mg Monday, Tuesday,      Thursday, Friday, and Saturday.  2.  Toprol-XL 50 mg daily.  3.  Digoxin 0.25 mg daily.  4.  Norvasc 5 mg daily.  5.  Micardis 80 mg daily.  6.  Synthroid 0.1 mg daily.  7.  Zoloft 50 mg daily.   FAMILY HISTORY:  Noncontributory.   PHYSICAL EXAMINATION:  GENERAL:  Anxious white female in no acute distress.  VITAL SIGNS:  Blood pressures listed above was 130/70 supine and 120/70  standing.  HEENT:  Head:  Normocephalic.  Eyes:  PERRLA.  EOMs intact.  Fundi not  visualized.  Ears, nose, throat, and mouth grossly within normal limits.  NECK:  No bruits, no JVD, no thyromegaly.  CHEST:  Clear to auscultation and percussion.  CARDIAC:  No murmurs, rubs, or gallops.  ABDOMEN:  Soft, nontender, positive bowel sounds.  No hepatosplenomegaly.  GENITOURINARY/RECTAL:  Deferred.  EXTREMITIES:  No clubbing, cyanosis, or edema.  NEUROLOGIC:  No focal deficits.   IMPRESSION:  1.  Atrial fibrillation, positive history paroxysmal atrial fibrillation,      sick sinus  syndrome, and permanent transvenous pacemaker placement      November 2003.  2.  Anxiety and depression.  3.  Hypothyroidism.  4.  History of diverticulosis.  5.  Diet-controlled diabetes.  6.  Diverticulosis.  7.  History of anxiety and depression.   PLAN:  The patient will be admitted for observation, placed on telemetry.  Calcium channel blocker is being added to her current beta blocker and  digoxin.      Will   WDJ/MEDQ  D:  03/10/2004  T:  03/10/2004  Job:  [id number]   cc:   [name], M.D. [facility]

## 2010-10-07 NOTE — Assessment & Plan Note (Signed)
[facility]                         ELECTROPHYSIOLOGY OFFICE NOTE   [name], [name]                     MRN:          [id number]  DATE:08/01/2006                            DOB:          1927-03-06    Ms. Allocca returns today for followup.  She is a very pleasant elderly  woman with a history of persistent atrial fibrillation who has  maintained sinus rhythm very nicely on Tikosyn therapy.  She is also  status post pacemaker insertion.  She returns today for followup.  She  has continued to have her resting tremor, but otherwise has been stable.  She denies much in the way of palpitations.  She denies chest pain or  shortness of breath.   PHYSICAL EXAM:  She is a pleasant, well-appearing, elderly woman in no  distress.  Blood pressure 117/68, the pulse 65 and regular, the  respirations were 18 and the weight was 160 pounds.  NECK:  No jugular venous distention.  LUNGS:  Clear bilaterally to auscultation.  There were no wheezes, rales  or rhonchi.  CARDIOVASCULAR:  A regular rate and rhythm with normal S1 and S2.  EXTREMITIES:  No edema.   Interrogation of her pacemaker demonstrates a Medtronic Kappa 800 with P  waves of 4 and R waves of 5, the impedance 472 in the atrium, 621 in the  ventricle, the threshold 0.25 at 0.4 in the right atrium, 1.4 in the  right ventricle, battery voltage 2.76 volts.  The patient had 13  episodes of afib, the longest of which was 47 seconds.   IMPRESSION:  1. Symptomatic bradycardia.  2. Atrial fibrillation with rapid ventricular response.  3. Status post pacemaker insertion.   DISCUSSION:  Overall, Ms. Blacksher is stable.  She will plan to see [place]  back in the office in 1 year.  She will follow up in [facility].     Doylene Canning. Ladona Ridgel, MD  Electronically Signed    GWT/MedQ  DD: 08/01/2006  DT: 08/03/2006  Job #: [id number]

## 2010-10-07 NOTE — Consult Note (Signed)
[name], [name]                          ACCOUNT NO.:  [id number]   MEDICAL RECORD NO.:  [id number]                   PATIENT TYPE:  INP   LOCATION:  2030                                 FACILITY:  [facility]   PHYSICIAN:  Doylene Canning. Ladona Ridgel, M.D. [facility]           DATE OF BIRTH:  09-Aug-1926   DATE OF CONSULTATION:  04/21/2002  DATE OF DISCHARGE:                                   CONSULTATION   REASON FOR CONSULTATION:  Evaluation of atrial fibrillation.   HISTORY OF PRESENT ILLNESS:  The patient is a very pleasant 77 year old  woman with a history of diet controlled diabetes, hypertension, and  diverticulitis.  She had palpitations for many years but developed paroxysms  of atrial fibrillation approximately three to four months ago.  At that  time, she was admitted to the hospital, begun on Coumadin, and Rhythmol.  She has unfortunately had recurrence of her atrial arrhythmias during her  hospitalization.  In addition, she has had fairly prolonged symptomatic  posttermination pause.  She now feels quite well.  She denies chest pain or  shortness of breath.  She denies any other syncopal episodes.   PAST MEDICAL HISTORY:  As previously noted.  She has had one hospitalization  for diverticulitis.   FAMILY HISTORY:  Notable for coronary artery disease and diabetes.   SOCIAL HISTORY:  The patient lives alone.  She has four grown children.  She  denies tobacco or ethanol abuse.   REVIEW OF SYMPTOMS:  Negative for vision or hearing problems.  She does wear  glasses for visual acuity.  She has sinus congestion occasionally.  She  denies any bleeding problems.  She denies any abdominal pain except for rare  episodes of diverticulitis.  She denies any nausea, vomiting, diarrhea, or  constipation.  She denies any swelling in her lower extremities.  She denies  any skin problems.  She denies any chest pain or shortness of breath.  She  denies any neurologic changes.  When she is in atrial  fibrillation, she  feels somewhat weak and tired and is somewhat drained feeling.   PHYSICAL EXAMINATION:  GENERAL:  She is a pleasant, well-appearing, mildly  obese woman in no distress.  VITAL SIGNS:  Blood pressure is 140/70, pulse is 64 and regular,  respirations are 20.  Weight was 176 pounds.  HEENT:  Normocephalic and atraumatic.  Pupils were equal and round.  Oropharynx was moist.  The sclerae were anicteric.  NECK:  Revealed no jugular venous distention.  The carotids were 2+ and  symmetric without bruits.  The trachea was midline.  There was no obvious  thyromegaly.  HEART:  Regular rate and rhythm with a normal S1 and S2.  LUNGS:  Clear bilaterally to auscultation.  There were no wheezes, rales, or  rhonchi.  ABDOMEN:  Soft, nontender, and nondistended.  There was no organomegaly.  EXTREMITIES:  No clubbing, cyanosis, or edema.  NEUROLOGICAL:  She is alert and oriented x 3 with cranial nerves II through  XII grossly intact.  The strength is 5/5 and symmetric.   LABORATORY DATA:  Her EKG demonstrates normal sinus rhythm with normal  access and intervals.  When she is at her rhythm, she has what appears to be  atrial fibrillation, though I cannot rule out any typical atrial flutter  with intermittent variable AV conduction.   IMPRESSION:  1. Paroxysm atrial arrhythmias.  2. Status post chronic Coumadin therapy.  3. Hypertension.  4. History of posttermination pauses associated with near syncope.   DISPOSITION:  I discussed the treatment options with the patient in some  detail.  These include continued and up titration of her Pitofenone versus  initiation of amiodarone and discontinuation of Pitofenone with continuation  of her beta blocker.  Other antiarrhythmic drugs would also be an option for  this patient.  At the present time, I agree with you that she can hold off  permanent pacemaker, although the likelihood of her eventually needing  permanent pacemaker to  prevent symptomatic posttermination pauses is not  low.  In addition, with amiodarone, if she goes into it, may have better  rate control.  For this reason, I have recommended that she start amiodarone  and we will begin this at 400 mg twice a day.  We will watch her in the  hospital for a couple of days to make sure she does not have any pleural  arrhythmia, severe bradycardia, and require permanent pacing.  Liver panel,  TSH, and pulmonary function testing also would be warranted in this  particular patient.                                               Doylene Canning. Ladona Ridgel, M.D. [facility]    GWT/MEDQ  D:  04/21/2002  T:  04/21/2002  Job:  [id number]   cc:   Cristy Hilts. Jacinto Halim, M.D.  [address]  [place]  [place] [address]  Fax: [phone]   Lacretia Leigh. Quintella Reichert, M.D.  [address] [place]  [place] [address]  Fax: [phone]   Kathrine Cords, R.N. [facility]  [address]  [place], [place] [address]  Fax: [phone]

## 2010-10-07 NOTE — Consult Note (Signed)
NAMEARIBELLE, MCCOSH              ACCOUNT NO.:  [id number]   MEDICAL RECORD NO.:  [id number]          PATIENT TYPE:  INP   LOCATION:  2030                         FACILITY:  [facility]   PHYSICIAN:  Mark E. Severiano Gilbert, M.D.    DATE OF BIRTH:  1926-12-09   DATE OF CONSULTATION:  04/28/2004  DATE OF DISCHARGE:                                   CONSULTATION   REFERRING PHYSICIAN:  Nicki Guadalajara, M.D.   REASON FOR CONSULTATION:  Atrial fibrillation.   HISTORY OF PRESENT ILLNESS:  A 77 year old white female with a long history  of sick sinus syndrome dating back to 2003 complicated frequent episodes of  paroxysmal atrial fibrillation.  She states that for the last two days, she  has been in atrial fibrillation which she notes most readily secondary to  increase in her heart rate as well as increase in her urinary frequency.  She attributes this onset to increased stress in her life recently and  states that as long as she can relax, her heart rate is not too much of a  problem but with any kind of physical or mental stress, heart rate races  often much greater than 120 beats per minute.  Because of symptoms of  tachycardia, mild lightheadedness and worsening anxiety without conversion  back to sinus rhythm, the patient was asked to present to the emergency room  through which she was admitted to the hospital.  She is on telemetry at the  current time.   PAST MEDICAL HISTORY:  1.  Sick sinus syndrome.  2.  Permanent pacemaker secondary to symptomatic bradycardia.  3.  Paroxysmal atrial fibrillation, currently in atrial fibrillation.  4.  Hypertension.  5.  History of diet controlled diabetes.  6.  Hypothyroidism.  7.  Remote history of diverticulitis.  8.  History of anxiety and depression.   SOCIAL HISTORY:  She is widowed.  She does not smoke.  She does not drink.   FAMILY HISTORY:  There is a history of diabetes but otherwise negative.  A  number of her siblings have symptomatic  bradycardia by report.   MEDICATIONS:  1.  Avapro 300 mg p.o. daily.  2.  Coumadin 1.25 mg p.o. daily alternating with 2.5 mg p.o. daily.  3.  Mysoline 50 mg p.o. daily.  4.  Cardizem 120 mg p.o. daily.  5.  Toprol XL 50 mg p.o. daily.  6.  Valium p.r.n.  7.  Zoloft 25 mg p.o. daily.   REVIEW OF SYMPTOMS:  GENERAL:  There has been no change in weight.  No fever  or chills.  MUSCULOSKELETAL:  She has chronic arthritic complaints.  SKIN:  She does not really complain about large ecchymotic areas or ulceration,  although she does have easy bruisability. RESPIRATORY:  She does have  increased shortness of breath during periods of atrial fibrillation, does  not have wheezing or asthma.  CARDIOVASCULAR:  Positive for palpitations.  Positive for a variety of atypical chest pain syndromes.  Positive for  pauses.  She does have a pacemaker as mentioned.  She is very aware of when  she is atrial fibrillation because of symptoms of palpitations.  GI:  She  denies nausea, vomiting, diarrhea or constipation.  GU:  She does have  increase in urinary frequency especially when in atrial fibrillation.  PSYCHIATRIC:  She does appear to be somewhat anxious.  No signs of  depression or mania.  HEENT:  Unremarkable.  NEUROLOGIC:  She has an upper  extremity greater than lower extremity intension type tremor.  No history of  TIA or seizure-like symptoms.  ENDOCRINE:  She has diet controlled diabetes  and does not really complain about polyuria, polydipsia or polyphagia unless  she is in atrial fibrillation with increased urinary frequency.  She has  history of hypothyroidism but manifests no current symptoms of  hypothyroidism.   PHYSICAL EXAMINATION:  VITAL SIGNS:  Blood pressure 134/91, heart rate 104,  respiratory rate 20, afebrile, 100% saturation on room air.  HEENT:  She has a tremor, normocephalic and atraumatic, midline nares.  NECK:  Supple, 2+ carotids, no bruits, no JVP, no thyromegaly, no   adenopathy.  Estimated JVP probably 5 to 7.  CHEST:  Normal female.  BACK:  Negative CVA tenderness.  LUNGS:  Clear to auscultation and percussion, no wheezes or rales.  CARDIOVASCULAR:  Irregularly irregular rhythm, normal S1, normal S2, no S3  or S4; no murmurs, gallops, rubs or thrills.  ABDOMEN:  Soft, nontender, nondistended with no hepatosplenomegaly.  Bowel  sounds present.  There were no bruits or masses.  GU AND RECTAL:  Deferred.  EXTREMITIES:  There were 2+ nuchal peripheral pulses, no clubbing, cyanosis,  or edema.  SKIN:  No evidence of ulceration or breakdown.  NEUROLOGIC:  She was alert and oriented x4, 77 very pleasant older lady.  Cranial nerves II-XII appear to be intact.  Screening motor and sensory  examination was nonfocal.   Electrocardiogram demonstrated atrial fibrillation with rapid ventricular  response, delayed R-wave progress consistent with lead position or prior  anterior MI.  There did appear to be findings consistent with old age  indeterminate inferior myocardial infarction.  There were nonspecific STT  wave changes.   LABORATORY DATA:  Hematocrit 43%, platelet count 157,000. INR was noted to  be 4.  Sodium 132, potassium 3.8, creatinine 0.9, fasting glucose 117.  Troponin was slightly elevated at 0.02.  There really no syndrome consistent  with acute coronary syndrome.   IMPRESSION:  Paroxysmal atrial fibrillation.  Patient has had a rather long  complicated course with atrial fibrillation which is part of her tachy-brady  syndrome.  She had a trial on Cordarone recently which appeared to have an  important effect in reducing her burden of atrial fibrillation. However,  secondary to photophobia and the patient's concerns regarding side effects,  Cordarone was discontinued.  She has also been tried on propafenone with  breakthrough on a moderate dose of 225 mg t.i.d.  Discussing at length with her, her options at this time and she is really reluctant to  proceed with  any primary antiarrhythmic therapies, perhaps sotalol might be a choice at  this time.  It appears that this current episode of atrial fibrillation is  very persistent and I suspect that she will not convert spontaneously.  I  discussed with her additional options of cardioversion plus or minus an  antiarrhythmic.  For relief of her systems, I suspect that she will probably  have a relatively marked improvement in her symptoms of tachycardia and  palpitations with an AV  node ablation.  Discussed the risks  and benefits of  this procedure and again she is reluctant to proceed at this time, reserving  that judgment for the future.   RECOMMENDATIONS:  1.  Rate control is currently being pursued with beta-blockers and oral      Cardizem which, at rest, she has excellent      rate control with heart rates in the 70s and 80s.  2.  Slight adjustment in Coumadin dose to decrease INR.  3.  Additional therapies as patient deems fit in the future.       MEP/MEDQ  D:  04/28/2004  T:  04/29/2004  Job:  [id number]   cc:   Nicki Guadalajara, M.D.  [phone]  [place], [place] [address]  Fax: [phone]

## 2010-10-07 NOTE — Assessment & Plan Note (Signed)
[facility]                           GASTROENTEROLOGY OFFICE NOTE   [name], [name]                     MRN:          [id number]  DATE:03/13/2006                            DOB:          12/26/1926    This pleasant lady is a 77 year old white female with atrial fibrillation  requiring chronic anticoagulation.  She suffers from hypertension and  cardiac arrhythmias for many years.  She also has mild congestive heart  failure and diastolic dysfunction.  I have seen her for many years because  of diverticulosis and she last had colonoscopy in May 2005 and had pan-  colonic diverticulosis.  She comes in complaining of increasing constipation  with periodic rectal bleeding which she feels is secondary to hemorrhoids.  She is on chronic Coumadin therapy.  She has recurrent crampy lower  abdominal pain.  She has some bleeding that is managed by Osamine 1.25 mg on  a p.r.n. basis.  She denies upper GI or hepatobiliary complaints.  Her  appetite is good and her weight is stable.  She is followed medically by Dr.  Cato Mulligan and cardiology by Dr. Ladona Ridgel.  Hemoccult cards were last returned in  2001 and were guaiac negative.  I do not have any recent laboratory data  concerning this patient.   Her past medical history revolves mostly around cardiac problems and, also,  history of adult onset diabetes.  She has been on anti-coagulants for at  least five years because of periodic atrial arrhythmias and currently has a  pacemaker installed after she failed Amiodarone therapy.  Additional  problems have included anxiety and depression.   MEDICATIONS:  Coumadin 2.5 mg daily, Levothyroxine 75 mcg daily, Micardis at  80 mg 1/2 tablet a day, Diltiazem ER 180-24 daily, Osamine p.r.n., Digitek  0.25 mg daily, Glimepiride 2 mg daily, potassium 20 mEq daily, Metoprolol 50  mg daily, Sertraline 50 mg daily, Tikosyn 250 mcg twice a day.   ALLERGIES:  She denies true  drug allergies.   FAMILY HISTORY:  Remarkable for diabetes and atherogenesis in multiple  family members, but no known gastrointestinal problems.   SOCIAL HISTORY:  The patient is widowed and lives by herself.  She has four  children.  She does not smoke or use ethanol.   REVIEW OF SYMPTOMS:  Otherwise, noncontributory, without current  cardiovascular or pulmonary complaints except for a dry, non-productive  cough.  She denies current psychiatric problems and is on anti-depressants.  Review of systems, otherwise, noncontributory.   PHYSICAL EXAMINATION:  GENERAL:  She is a very pleasant, attractive white female, appearing  slightly younger than her stated age.  She is 5 feet 5 inches tall and  weighs 158 pounds.  Blood pressure 100/58, pulse 80 and regular.  I could  not appreciate stigmata of chronic liver disease, thyromegaly, or  lymphadenopathy.  CHEST:  Entirely clear.  HEART:  No murmurs, gallops, and rubs.  She appeared to be in a regular  rhythm at this time.  ABDOMEN:  I could not appreciate hepatosplenomegaly, abdominal masses or  tenderness.  Bowel sounds were normal.  RECTAL:  Inspection of the rectum was unremarkable as was rectal exam and  stool was guaiac negative.  EXTREMITIES:  Upper extremities showed no edema, phlebitis, or swollen  joints.  Her right ankle was, however, somewhat edematous and swollen from a  recent sprain.  MENTAL STATUS:  Clear.   ASSESSMENT:  1. Rectal bleeding probably from hemorrhoids - rule out colon polyps in a      patient who is chronically anti-coagulated and cannot stop her Coumadin      for colonoscopy exam.  2. History of current diverticulosis without diverticulitis for many      years.  3. Chronic atrial fibrillation with anticoagulation and pacemaker in      place.  4. History of essential hypertension.  5. History of hyperlipidemia.  6. History of chronic thyroid dysfunction.  7. History of anxiety and depression with  therapy.   RECOMMENDATIONS:  1. Continue all medications listed above.  2. High fiber diet with daily Benefiber and liberal p.o. fluids.  3. Outpatient colonoscopy exam.  4. Check screening laboratory parameters.       Maria Rea. Jarold Motto, MD, Caleen Essex, FAGA      DRP/MedQ  DD:  03/13/2006  DT:  03/14/2006  Job #:  [id number]   cc:   Valetta Mole. Cato Mulligan, MD  Doylene Canning. Ladona Ridgel, MD

## 2010-10-07 NOTE — Op Note (Signed)
[name], [name]                          ACCOUNT NO.:  [id number]   MEDICAL RECORD NO.:  [id number]                   PATIENT TYPE:  INP   LOCATION:  2030                                 FACILITY:  [facility]   PHYSICIAN:  Darlin Priestly, M.D.             DATE OF BIRTH:  1927/03/10   DATE OF PROCEDURE:  04/28/2002  DATE OF DISCHARGE:                                 OPERATIVE REPORT   PROCEDURE:  Insertion of a dual-chamber pacemaker.   COMPLICATIONS:  None.   INDICATIONS:  The patient is a 77 year old female, patient of Dr. Yates Decamp,  initially admitted with palpitations.  The patient also has a history of non-  insulin dependent diabetes mellitus, hypertension, and diverticulitis.  The  patient was noted to have several episodes of paroxysmal atrial arrhythmias  including atrial fibrillation.  The patient was admitted with Toprol-XL and  Coumadin as well as Rythmol.  Her Rythmol was subsequently discontinued  after the patient was noted to have profound bradycardia with increased dose  of Toprol-XL.  She was seen in consultation by Dr. Lewayne Bunting, and it is  determined the patient would benefit from a backup pacemaker placement in an  attempt to allow further chronotropic agents.  The patient is now referred  for dual-chamber pacemaker placement.   DESCRIPTION OF PROCEDURE:  After given informed written consent, the patient  was brought to the cardiac catheterization laboratory in a fasting state.  The left anterior chest wall was prepped and draped in a sterile fashion.  Lidocaine 1% was then used to anesthetize the left infraclavicular area.  Next, approximately a 3 cm horizontal  incision was then performed beneath the left clavicle.  Hemostasis was  obtained with electrocautery.  Blunt dissection was then carried down to the  left pectoral fascia and again hemostasis was obtained.  Approximately a 3 x  4 cm pocket was then created above the left pectoral fascia  without  difficulties.  Next, a single left subclavian stick was then used to enter  the left subclavian vein.  A #9 French dilator and sheath were then easily  passed into the left subclavian vein and a guide wire was easily passed into  the SVC and right atrium. Next, a 52 cm Medtronic 5092 passive lead, serial  #[id number] V was then  positioned in the LV apex.  A second retaining wire  was then passed through the sheath then the sheath was then removed.  A  second 9 French dilator sheath was then easily passed over the previously  placed guide wire and the guide wire and dilator then removed.  A second 45  cm Medtronic passive lead, model #5594, serial #[id number] was then easily  passed into the right atrium.  The retaining guide wire was then replaced  through the sheath and the sheath was then removed.  The guide wire was then  anchored to the sheath using a  mosquito clamp.  The right atrial lead was  then positioned in the right atrial appendage and threshold was then  determined in the RA and RV.  P waves were measured at 5.4 mV.  Impedence in  the atrial lead was 586 ohms.  Threshold in the atrium was 0.5 volts at 0.4  msec, current is 1.1 mA.  R waves were sensed at 12.3 mV.  Impedence was 801  ohms.  Threshold in the ventricle was 0.4 volts at 0.5 msec.  Current is  0.67 mA, 10 volts were negative in both the A and the V.  The leads were  then sutured into place using two silk sutures per lead.  The pocket was  then copiously irrigated with 1% kanamycin solution.  The leads were then  connected in a serial fashion to a Medtronic Kappa [id number], serial  [id number] generator.  The generator margin then delivered into the pocket  and pacing was confirmed.  The pocket was then closed using 2-0 Dexon for  the fascial layer using running 5-0 Dexon for the skin layer.  Steri-Strips  were then applied.  The patient returned to the recovery room in stable  condition.    CONCLUSIONS:   Successful placement of a Medtronic Kappa [id number] generator  with passive atrial and ventricular leads.                                                Darlin Priestly, M.D.    RHM/MEDQ  D:  04/28/2002  T:  04/28/2002  Job:  [id number]   cc:   Cristy Hilts. Jacinto Halim, M.D.  [address]  [place]  [place] [address]  Fax: [phone]

## 2010-10-08 ENCOUNTER — Encounter: Payer: Self-pay | Admitting: Internal Medicine

## 2010-10-08 NOTE — Assessment & Plan Note (Signed)
Her device appears to be working normally. Will recheck in several months.

## 2010-10-08 NOTE — Progress Notes (Signed)
HPI [name] returns today for followup. She is a pleasant 77 yo woman with a h/o atrial fib and an RVR, s/p PPM. She underwent AV node ablation. She is improved. She denies c/p, sob, but does have some peripheral edema. She has been gardening some. No syncope or palpitations. Allergies  Allergen Reactions  . Codeine Sulfate     REACTION: unspecified  . Simvastatin     REACTION: choking, acid reflux     Current Outpatient Prescriptions  Medication Sig Dispense Refill  . acetaminophen (TYLENOL) 500 MG tablet Take 500 mg by mouth every 6 (six) hours as needed.        . AMITIZA 24 MCG capsule TAKE 1 CAPSULE TWICE DAILY  62 capsule  4  . Cholecalciferol (VITAMIN D3) 2000 UNITS TABS Take 1 tablet by mouth daily.        . digoxin (LANOXIN) 0.125 MG tablet Take 1 tablet (125 mcg total) by mouth daily.  30 tablet  11  . diltiazem (CARDIZEM CD) 300 MG 24 hr capsule Take 1 capsule (300 mg total) by mouth daily.  30 capsule  11  . ferrous sulfate 325 (65 FE) MG tablet Take 325 mg by mouth daily with breakfast.        . glimepiride (AMARYL) 4 MG tablet Take 4 mg by mouth daily before breakfast.        . glucose blood (ONE TOUCH TEST STRIPS) test strip 1 each by Other route as needed. Use as instructed       . hyoscyamine (LEVBID) 0.375 MG 12 hr tablet Take 0.375 mg by mouth every 12 (twelve) hours as needed.        . Lancets MISC       . levothyroxine (SYNTHROID, LEVOTHROID) 75 MCG tablet Take 75 mcg by mouth daily.        . polyethylene glycol powder (MIRALAX) powder Take 17 g by mouth at bedtime.        . sertraline (ZOLOFT) 50 MG tablet Take 50 mg by mouth daily.        . simethicone (MYLICON) 125 MG chewable tablet Chew 125 mg by mouth every 6 (six) hours as needed.        . traMADol (ULTRAM) 50 MG tablet TAKE 1 OR 2 TABLETS BY MOUTH EVERY 6 HOURS AS NEEDED FOR PAIN  60 tablet  0  . warfarin (COUMADIN) 5 MG tablet Take 0.5 tablets (2.5 mg total) by mouth daily.  30 tablet  11  . ALPRAZolam  (XANAX) 0.25 MG tablet Take 1 tablet (0.25 mg total) by mouth at bedtime as needed for sleep.  30 tablet  0  . aspirin 81 MG tablet Take 81 mg by mouth daily.        . metoprolol (LOPRESSOR) 50 MG tablet Take 1 tablet by mouth Twice daily.      Marland Kitchen telmisartan (MICARDIS) 80 MG tablet Take 80 mg by mouth daily.           Past Medical History  Diagnosis Date  . Osteoporosis   . Hyperlipidemia   . Tremor, essential   . CHF (congestive heart failure)   . Type II or unspecified type diabetes mellitus with neurological manifestations, not stated as uncontrolled   . Hypertension   . Thyroid disease   . Diverticulosis of colon   . Atrial fibrillation   . Anxiety     ROS:   All systems reviewed and negative except as noted in the HPI.  Past Surgical History  Procedure Date  . Cataract extraction 2002  . Pacemaker placement   . L3 compression fraction      Family History  Problem Relation Age of Onset  . Heart attack Father   . Colon cancer Neg Hx      History   Social History  . Marital Status: Widowed    Spouse Name: N/A    Number of Children: N/A  . Years of Education: N/A   Occupational History  . Not on file.   Social History Main Topics  . Smoking status: Never Smoker   . Smokeless tobacco: Not on file  . Alcohol Use: No  . Drug Use: Not on file  . Sexually Active:    Other Topics Concern  . Not on file   Social History Narrative  . No narrative on file     BP 138/68  Pulse 84  Ht 5\' 5"  (1.651 m)  Wt 166 lb 12.8 oz (75.66 kg)  BMI 27.76 kg/m2  Physical Exam:  Well appearing NAD HEENT: Unremarkable Neck:  No JVD, no thyromegally Lymphatics:  No adenopathy Back:  No CVA tenderness Lungs:  Clear. Well healed PPM incision. HEART:  Regular rate rhythm, no murmurs, no rubs, no clicks Abd:  Flat, positive bowel sounds, no organomegally, no rebound, no guarding Ext:  2 plus pulses, no edema, no cyanosis, no clubbing Skin:  No rashes no  nodules Neuro:  CN II through XII intact, motor grossly intact  DEVICE  Normal device function.  See PaceArt for details.   Assess/Plan:

## 2010-10-08 NOTE — Assessment & Plan Note (Signed)
Her ventricular rate is well controlled. She is pacing almost 100% of the time. Will follow.

## 2010-10-08 NOTE — Assessment & Plan Note (Signed)
She is well compensated class 2. She will continue her current meds.

## 2010-10-11 ENCOUNTER — Ambulatory Visit (INDEPENDENT_AMBULATORY_CARE_PROVIDER_SITE_OTHER): Payer: Medicare Other | Admitting: Internal Medicine

## 2010-10-11 DIAGNOSIS — I82409 Acute embolism and thrombosis of unspecified deep veins of unspecified lower extremity: Secondary | ICD-10-CM

## 2010-10-11 NOTE — Patient Instructions (Signed)
Don't take any coumadin till you check on Friday.

## 2010-10-14 ENCOUNTER — Ambulatory Visit: Payer: Medicare Other

## 2010-10-14 DIAGNOSIS — I82409 Acute embolism and thrombosis of unspecified deep veins of unspecified lower extremity: Secondary | ICD-10-CM

## 2010-10-14 LAB — POCT INR: INR: 5

## 2010-10-19 ENCOUNTER — Ambulatory Visit (INDEPENDENT_AMBULATORY_CARE_PROVIDER_SITE_OTHER): Payer: Medicare Other | Admitting: Internal Medicine

## 2010-10-19 DIAGNOSIS — I82409 Acute embolism and thrombosis of unspecified deep veins of unspecified lower extremity: Secondary | ICD-10-CM

## 2010-10-19 LAB — POCT INR: INR: 3.2

## 2010-10-19 NOTE — Patient Instructions (Signed)
2.5mg  ( HALF ) everyday and check in one month

## 2010-10-21 ENCOUNTER — Encounter: Payer: Self-pay | Admitting: Internal Medicine

## 2010-10-21 ENCOUNTER — Ambulatory Visit (INDEPENDENT_AMBULATORY_CARE_PROVIDER_SITE_OTHER): Payer: Medicare Other | Admitting: Internal Medicine

## 2010-10-21 DIAGNOSIS — I4891 Unspecified atrial fibrillation: Secondary | ICD-10-CM

## 2010-10-21 DIAGNOSIS — S9031XA Contusion of right foot, initial encounter: Secondary | ICD-10-CM

## 2010-10-21 DIAGNOSIS — I509 Heart failure, unspecified: Secondary | ICD-10-CM

## 2010-10-21 DIAGNOSIS — I82419 Acute embolism and thrombosis of unspecified femoral vein: Secondary | ICD-10-CM

## 2010-10-21 DIAGNOSIS — S9030XA Contusion of unspecified foot, initial encounter: Secondary | ICD-10-CM

## 2010-10-21 DIAGNOSIS — I824Y9 Acute embolism and thrombosis of unspecified deep veins of unspecified proximal lower extremity: Secondary | ICD-10-CM

## 2010-10-21 DIAGNOSIS — I10 Essential (primary) hypertension: Secondary | ICD-10-CM

## 2010-10-21 NOTE — Patient Instructions (Signed)
Keep legs elevated as much as possible Return for followup and a repeat prothrombin time as scheduled Call if there is any worsening pain or swelling  Low salt diet

## 2010-10-21 NOTE — Progress Notes (Signed)
  Subjective:    Patient ID: [name], female    DOB: Jul 08, 1926, 77 y.o.   MRN: [id number]  HPI  77 year old patient who is on chronic Coumadin anticoagulation. She has a prior history of DVT congestive heart failure and atrial fibrillation. She traumatized her right foot and ankle recently and has had some increasing bruising and slight swelling. There is very little pain and no difficulty with ambulation. She does have some chronic venous insufficiency and does use support hose. She does have some mild chronic lower extremity edema there has been reasonably well-controlled off diuretic therapy. Denies any symptoms of heart failure. She has had a recent INR.   Review of Systems  Cardiovascular: Positive for leg swelling.  Skin: Positive for rash.       [Bruising right ankle and foot      Objective:   Physical Exam  Constitutional: She appears well-developed and well-nourished. No distress.       BP low normal 100/64  Pulmonary/Chest: Effort normal and breath sounds normal.  Musculoskeletal:       No significant left leg edema did have support stockings in place She had mild swelling about the right ankle and foot with scattered ecchymoses. There is no tenderness excessive warmth          Assessment & Plan:   Contusion and ecchymosis right foot Chronic atrial fibrillation Chronic Coumadin anticoagulation History of DVT  The patient was reassured. She will use of support stockings and attempt to keep her lower extremities elevated low salt diet encouraged. She'll call if unimproved

## 2010-10-28 ENCOUNTER — Ambulatory Visit (INDEPENDENT_AMBULATORY_CARE_PROVIDER_SITE_OTHER): Payer: Medicare Other | Admitting: Internal Medicine

## 2010-10-28 ENCOUNTER — Encounter: Payer: Self-pay | Admitting: Internal Medicine

## 2010-10-28 DIAGNOSIS — I10 Essential (primary) hypertension: Secondary | ICD-10-CM

## 2010-10-28 DIAGNOSIS — I82409 Acute embolism and thrombosis of unspecified deep veins of unspecified lower extremity: Secondary | ICD-10-CM

## 2010-10-28 DIAGNOSIS — I824Y9 Acute embolism and thrombosis of unspecified deep veins of unspecified proximal lower extremity: Secondary | ICD-10-CM

## 2010-10-28 DIAGNOSIS — I4891 Unspecified atrial fibrillation: Secondary | ICD-10-CM

## 2010-10-28 DIAGNOSIS — I82419 Acute embolism and thrombosis of unspecified femoral vein: Secondary | ICD-10-CM

## 2010-10-28 LAB — POCT INR: INR: 3.8

## 2010-10-28 MED ORDER — METOPROLOL TARTRATE 50 MG PO TABS: 50.0000 mg | ORAL_TABLET | Freq: Every day | ORAL | Status: DC

## 2010-10-28 NOTE — Patient Instructions (Signed)
  Latest dosing instructions   Total Sun Mon Tue Wed Thu Fri Sat   12.5 2.5 mg  2.5 mg 2.5 mg 2.5 mg  2.5 mg    (5 mg0.5)  (5 mg0.5) (5 mg0.5) (5 mg0.5)  (5 mg0.5)

## 2010-10-28 NOTE — Progress Notes (Signed)
  Subjective:    Patient ID: [name], female    DOB: Mar 05, 1927, 77 y.o.   MRN: [id number]  HPI  Complicated patient AFIB---on warfarin---she is at high risk of complications because i think she gets confused about medications. She is not able to tell me what medications she takes  HTN---unclear what medications she is taking  CH---no sxs  Warfarin therapy---no bleeding complications  Past Medical History  Diagnosis Date  . Osteoporosis   . Hyperlipidemia   . Tremor, essential   . CHF (congestive heart failure)   . Type II or unspecified type diabetes mellitus with neurological manifestations, not stated as uncontrolled   . Hypertension   . Thyroid disease   . Diverticulosis of colon   . Atrial fibrillation   . Anxiety    Past Surgical History  Procedure Date  . Cataract extraction 2002  . Pacemaker placement   . L3 compression fraction     reports that she has never smoked. She does not have any smokeless tobacco history on file. She reports that she does not drink alcohol. Her drug history not on file. family history includes Heart attack in her father.  There is no history of Colon cancer. Allergies  Allergen Reactions  . Codeine Sulfate     REACTION: unspecified  . Simvastatin     REACTION: choking, acid reflux     Review of Systems  patient denies chest pain, shortness of breath, orthopnea. Denies lower extremity edema, abdominal pain, change in appetite, change in bowel movements. Patient denies rashes, musculoskeletal complaints. No other specific complaints in a complete review of systems.      Objective:   Physical Exam  Well-developed well-nourished female in no acute distress. HEENT exam atraumatic, normocephalic, extraocular muscles are intact. Neck is supple. No jugular venous distention no thyromegaly. Chest clear to auscultation without increased work of breathing. Cardiac exam S1 and S2 are irregular. Abdominal exam active bowel sounds, soft,  nontender. Extremities no edema. Neurologic exam she is alert without any motor sensory deficits. Gait is normal.        Assessment & Plan:  I have reviewed the patient's medication list in detail. It is quite clear that she doesn't know what medications he is taking. I have asked her to come back with her whole bag of medications. I want her to go pill by pill bottle as to what she takes. We will then record that. I am monitoring her warfarin therapy closely.

## 2010-10-29 ENCOUNTER — Other Ambulatory Visit: Payer: Self-pay | Admitting: Internal Medicine

## 2010-11-04 ENCOUNTER — Ambulatory Visit (INDEPENDENT_AMBULATORY_CARE_PROVIDER_SITE_OTHER): Payer: Medicare Other | Admitting: Internal Medicine

## 2010-11-04 DIAGNOSIS — I82409 Acute embolism and thrombosis of unspecified deep veins of unspecified lower extremity: Secondary | ICD-10-CM

## 2010-11-04 DIAGNOSIS — I4891 Unspecified atrial fibrillation: Secondary | ICD-10-CM

## 2010-11-04 LAB — POCT INR: INR: 5

## 2010-11-04 MED ORDER — DIGOXIN 125 MCG PO TABS: 125.0000 ug | ORAL_TABLET | Freq: Every day | ORAL | Status: DC

## 2010-11-04 MED ORDER — DILTIAZEM HCL ER COATED BEADS 300 MG PO CP24: 300.0000 mg | ORAL_CAPSULE | Freq: Every day | ORAL | Status: DC

## 2010-11-04 MED ORDER — HYOSCYAMINE SULFATE ER 0.375 MG PO TB12: 0.3750 mg | ORAL_TABLET | Freq: Two times a day (BID) | ORAL | Status: DC | PRN

## 2010-11-04 NOTE — Patient Instructions (Signed)
Do not take this Friday or Saturday then take no coumadin on Mondays or Fridays and all other days take 2.5mg . Check in 2 weeks

## 2010-11-04 NOTE — Assessment & Plan Note (Signed)
Rate controlled 

## 2010-11-04 NOTE — Assessment & Plan Note (Signed)
She continues on warfarin. I think she is at high risk of complications from her friend likely related to compliance. However I think the benefits of warfarin therapy outweigh the risks

## 2010-11-11 ENCOUNTER — Encounter: Payer: Self-pay | Admitting: Internal Medicine

## 2010-11-11 ENCOUNTER — Ambulatory Visit (INDEPENDENT_AMBULATORY_CARE_PROVIDER_SITE_OTHER)
Admission: RE | Admit: 2010-11-11 | Discharge: 2010-11-11 | Disposition: A | Discharge status: Home / Self Care | Payer: Medicare Other | Source: Ambulatory Visit | Attending: Internal Medicine | Admitting: Internal Medicine

## 2010-11-11 ENCOUNTER — Ambulatory Visit (INDEPENDENT_AMBULATORY_CARE_PROVIDER_SITE_OTHER): Payer: Medicare Other | Admitting: Internal Medicine

## 2010-11-11 VITALS — BP 162/80 | Temp 99.2°F | Ht 65.0 in | Wt 161.0 lb

## 2010-11-11 DIAGNOSIS — I82409 Acute embolism and thrombosis of unspecified deep veins of unspecified lower extremity: Secondary | ICD-10-CM

## 2010-11-11 DIAGNOSIS — E119 Type 2 diabetes mellitus without complications: Secondary | ICD-10-CM

## 2010-11-11 DIAGNOSIS — M549 Dorsalgia, unspecified: Secondary | ICD-10-CM

## 2010-11-11 LAB — HEPATIC FUNCTION PANEL
ALT: 19 U/L (ref 0–35)
AST: 18 U/L (ref 0–37)
Alkaline Phosphatase: 66 U/L (ref 39–117)
Bilirubin, Direct: 0.2 mg/dL (ref 0.0–0.3)
Total Bilirubin: 0.8 mg/dL (ref 0.3–1.2)
Total Protein: 7 g/dL (ref 6.0–8.3)

## 2010-11-11 LAB — BASIC METABOLIC PANEL
CO2: 24 mEq/L (ref 19–32)
Chloride: 99 mEq/L (ref 96–112)
Potassium: 4.3 mEq/L (ref 3.5–5.1)

## 2010-11-11 LAB — LIPID PANEL
Total CHOL/HDL Ratio: 7
VLDL: 54.4 mg/dL — ABNORMAL HIGH (ref 0.0–40.0)

## 2010-11-11 LAB — HEMOGLOBIN A1C: Hgb A1c MFr Bld: 8 % — ABNORMAL HIGH (ref 4.6–6.5)

## 2010-11-11 LAB — LDL CHOLESTEROL, DIRECT: Direct LDL: 123 mg/dL

## 2010-11-11 MED ORDER — WARFARIN SODIUM 2 MG PO TABS: 2.0000 mg | ORAL_TABLET | Freq: Every day | ORAL | Status: DC

## 2010-11-11 NOTE — Progress Notes (Signed)
  Subjective:    Patient ID: [name], female    DOB: 1926/11/04, 77 y.o.   MRN: [id number]  HPI  midscapular back pain Progressive pain for several weeks. No hematuria Uses tramadol---some relief No fever or chills No rash  i have reviewed her meds with pt and duaghter. i have marked the medicaitons the best I can---i think she is confused.  Past Medical History  Diagnosis Date  . Osteoporosis   . Hyperlipidemia   . Tremor, essential   . CHF (congestive heart failure)   . Type II or unspecified type diabetes mellitus with neurological manifestations, not stated as uncontrolled   . Hypertension   . Thyroid disease   . Diverticulosis of colon   . Atrial fibrillation   . Anxiety    Past Surgical History  Procedure Date  . Cataract extraction 2002  . Pacemaker placement   . L3 compression fraction     reports that she has never smoked. She does not have any smokeless tobacco history on file. She reports that she does not drink alcohol. Her drug history not on file. family history includes Heart attack in her father.  There is no history of Colon cancer. Allergies  Allergen Reactions  . Codeine Sulfate     REACTION: unspecified  . Simvastatin     REACTION: choking, acid reflux     Review of Systems  patient denies chest pain, shortness of breath, orthopnea. Denies lower extremity edema, abdominal pain, change in appetite, change in bowel movements. Patient denies rashes, musculoskeletal complaints. No other specific complaints in a complete review of systems.      Objective:   Physical Exam  Well-developed well-nourished female in no acute distress. HEENT exam atraumatic, normocephalic, extraocular muscles are intact. Neck is supple. No jugular venous distention no thyromegaly. Chest clear to auscultation without increased work of breathing. Cardiac exam S1 and S2 are regular. Abdominal exam active bowel sounds, soft, nontender. Extremities no edema. Neurologic  exam she is alert without any motor sensory deficits. Intentional tremor        Assessment & Plan:  The patient's main complaint today is back pain. She does have some mid scapular back pain. She has some tenderness to palpation over the area. I doubt that she has a compression fracture but I think this needs to be evaluated. I'll check a thoracic spine x-ray today. She has tramadol for use for pain if she needs it. I won't give her any other medications. I'm quite concerned that she doesn't handle her current medications accurately.  Type 2 diabetes blood sugar this morning was over 200. I've asked her to follow a very low carbohydrate diet. She should concentrate on eating vegetables and losing significant weight.

## 2010-11-11 NOTE — Patient Instructions (Signed)
Patient has been asked by Dr.Swords to throw away all coumadin doses.A new 2 mg dose has been called in for her to start taking.

## 2010-11-17 ENCOUNTER — Ambulatory Visit (INDEPENDENT_AMBULATORY_CARE_PROVIDER_SITE_OTHER): Payer: Medicare Other | Admitting: Internal Medicine

## 2010-11-17 DIAGNOSIS — I82409 Acute embolism and thrombosis of unspecified deep veins of unspecified lower extremity: Secondary | ICD-10-CM

## 2010-11-17 MED ORDER — GLIMEPIRIDE 4 MG PO TABS: 4.0000 mg | ORAL_TABLET | Freq: Every day | ORAL | Status: DC

## 2010-11-19 ENCOUNTER — Other Ambulatory Visit: Payer: Self-pay | Admitting: Internal Medicine

## 2010-11-24 ENCOUNTER — Other Ambulatory Visit: Payer: Self-pay | Admitting: Internal Medicine

## 2010-12-03 ENCOUNTER — Inpatient Hospital Stay (INDEPENDENT_AMBULATORY_CARE_PROVIDER_SITE_OTHER)
Admission: RE | Admit: 2010-12-03 | Discharge: 2010-12-03 | Disposition: A | Discharge status: Home / Self Care | Payer: Medicare Other | Source: Ambulatory Visit | Attending: Family Medicine | Admitting: Family Medicine

## 2010-12-03 DIAGNOSIS — E119 Type 2 diabetes mellitus without complications: Secondary | ICD-10-CM

## 2010-12-03 DIAGNOSIS — H6120 Impacted cerumen, unspecified ear: Secondary | ICD-10-CM

## 2010-12-03 LAB — GLUCOSE, CAPILLARY: Glucose-Capillary: 182 mg/dL — ABNORMAL HIGH (ref 70–99)

## 2010-12-05 ENCOUNTER — Telehealth: Payer: Self-pay | Admitting: Internal Medicine

## 2010-12-05 ENCOUNTER — Other Ambulatory Visit: Payer: Self-pay | Admitting: *Deleted

## 2010-12-05 MED ORDER — SERTRALINE HCL 50 MG PO TABS: 50.0000 mg | ORAL_TABLET | Freq: Every day | ORAL | Status: DC

## 2010-12-05 MED ORDER — GLUCOSE BLOOD VI STRP: ORAL_STRIP | Status: DC

## 2010-12-05 NOTE — Telephone Encounter (Signed)
Arline Asp, Please call the [facility] regarding Keandrea's test strips. Thanks!

## 2010-12-06 NOTE — Telephone Encounter (Signed)
Wanted to know how often she should test.  Told them once daily

## 2010-12-07 ENCOUNTER — Ambulatory Visit (INDEPENDENT_AMBULATORY_CARE_PROVIDER_SITE_OTHER): Payer: Medicare Other

## 2010-12-07 ENCOUNTER — Telehealth: Payer: Self-pay | Admitting: Internal Medicine

## 2010-12-07 DIAGNOSIS — I82409 Acute embolism and thrombosis of unspecified deep veins of unspecified lower extremity: Secondary | ICD-10-CM

## 2010-12-07 NOTE — Telephone Encounter (Signed)
Called new pharmacy and gave then info needed--new pharmacy has been put in for call backs- [facility] [id number]

## 2010-12-07 NOTE — Telephone Encounter (Signed)
New pharmacy------ on Freestyle test strips. It states to use as directed. However, her ins co is denying rx. Needs to read 3x day in order for her ins to cover. Please advise.

## 2010-12-07 NOTE — Patient Instructions (Signed)
2mg . Every day

## 2010-12-24 ENCOUNTER — Other Ambulatory Visit: Payer: Self-pay | Admitting: Internal Medicine

## 2011-01-04 ENCOUNTER — Ambulatory Visit (INDEPENDENT_AMBULATORY_CARE_PROVIDER_SITE_OTHER): Payer: Medicare Other

## 2011-01-04 DIAGNOSIS — I82409 Acute embolism and thrombosis of unspecified deep veins of unspecified lower extremity: Secondary | ICD-10-CM

## 2011-01-09 ENCOUNTER — Ambulatory Visit: Payer: Medicare Other

## 2011-01-09 DIAGNOSIS — I82409 Acute embolism and thrombosis of unspecified deep veins of unspecified lower extremity: Secondary | ICD-10-CM

## 2011-01-09 NOTE — Patient Instructions (Addendum)
Take 4mg . Today. Then Tuesday and Wednesday take 3mg . Thursday and Friday take 2mg .

## 2011-01-13 ENCOUNTER — Ambulatory Visit: Payer: Medicare Other

## 2011-01-13 DIAGNOSIS — I82409 Acute embolism and thrombosis of unspecified deep veins of unspecified lower extremity: Secondary | ICD-10-CM

## 2011-01-13 LAB — POCT INR: INR: 1.5

## 2011-01-13 NOTE — Patient Instructions (Addendum)
Take 5 mg. For 2 days then take 2.5mg  for the next 8 days Check in 10 days Dr. Kirtland Bouchard approved

## 2011-01-24 ENCOUNTER — Ambulatory Visit: Payer: Medicare Other

## 2011-01-30 ENCOUNTER — Encounter: Payer: Self-pay | Admitting: Internal Medicine

## 2011-01-30 ENCOUNTER — Ambulatory Visit (INDEPENDENT_AMBULATORY_CARE_PROVIDER_SITE_OTHER): Payer: Medicare Other | Admitting: Internal Medicine

## 2011-01-30 DIAGNOSIS — E119 Type 2 diabetes mellitus without complications: Secondary | ICD-10-CM

## 2011-01-30 DIAGNOSIS — E039 Hypothyroidism, unspecified: Secondary | ICD-10-CM

## 2011-01-30 DIAGNOSIS — R634 Abnormal weight loss: Secondary | ICD-10-CM

## 2011-01-30 DIAGNOSIS — I4891 Unspecified atrial fibrillation: Secondary | ICD-10-CM

## 2011-01-30 DIAGNOSIS — I509 Heart failure, unspecified: Secondary | ICD-10-CM

## 2011-01-30 DIAGNOSIS — I82409 Acute embolism and thrombosis of unspecified deep veins of unspecified lower extremity: Secondary | ICD-10-CM

## 2011-01-30 DIAGNOSIS — I10 Essential (primary) hypertension: Secondary | ICD-10-CM

## 2011-01-30 MED ORDER — DIAZEPAM 5 MG PO TABS: 5.0000 mg | ORAL_TABLET | Freq: Three times a day (TID) | ORAL | Status: DC | PRN

## 2011-01-30 MED ORDER — HYOSCYAMINE SULFATE CR 0.375 MG PO CP12: 0.3750 mg | ORAL_CAPSULE | Freq: Two times a day (BID) | ORAL | Status: DC | PRN

## 2011-01-30 NOTE — Patient Instructions (Addendum)
Limit your sodium (Salt) intake    It is important that you exercise regularly, at least 20 minutes 3 to 4 times per week.  If you develop chest pain or shortness of breath seek  medical attention.   Please check your hemoglobin A1c every 3 months  Take 2.5mg  of coumadin a day Check in 2 weeks

## 2011-01-30 NOTE — Progress Notes (Signed)
  Subjective:    Patient ID: [name], female    DOB: 25-Jul-1926, 77 y.o.   MRN: [id number]  HPI  Wt Readings from Last 3 Encounters:  01/30/11 144 lb (65.318 kg)  11/11/10 161 lb (73.029 kg)  10/28/10 165 lb (74.54 kg)   77 year old patient who is seen today for followup. She has some concerns about weight loss. Her weight is down 15 pounds since her last visit 3 months ago. 6 months ago her hemoglobin A1c was 6.5 and 3 months ago had risen to 8.0. She has been eating much better and cutting back on snacks especially cookies and desserts. She feels quite well. Denies any focal symptoms. He does have some back pain but this has been present for years and is unchanged. She has hypothyroidism. Laboratory studies were checked in June and did have a mild elevated sedimentation rate. She has congestive heart failure which has been stable. She is requesting a refill on multiple medications. He has atrial fibrillation on chronic Coumadin anticoagulation  Review of Systems  Constitutional: Positive for fatigue and unexpected weight change.  HENT: Negative for hearing loss, congestion, sore throat, rhinorrhea, dental problem, sinus pressure and tinnitus.   Eyes: Negative for pain, discharge and visual disturbance.  Respiratory: Negative for cough and shortness of breath.   Cardiovascular: Negative for chest pain, palpitations and leg swelling.  Gastrointestinal: Negative for nausea, vomiting, abdominal pain, diarrhea, constipation, blood in stool and abdominal distention.  Genitourinary: Negative for dysuria, urgency, frequency, hematuria, flank pain, vaginal bleeding, vaginal discharge, difficulty urinating, vaginal pain and pelvic pain.  Musculoskeletal: Positive for back pain. Negative for joint swelling, arthralgias and gait problem.  Skin: Negative for rash.  Neurological: Negative for dizziness, syncope, speech difficulty, weakness, numbness and headaches.  Hematological: Negative for  adenopathy.  Psychiatric/Behavioral: Negative for behavioral problems, dysphoric mood and agitation. The patient is not nervous/anxious.        Objective:   Physical Exam  Constitutional: She is oriented to person, place, and time. She appears well-developed and well-nourished. No distress.       Blood pressure 130/80. Clinically appears well  HENT:  Head: Normocephalic.  Right Ear: External ear normal.  Left Ear: External ear normal.  Mouth/Throat: Oropharynx is clear and moist.  Eyes: Conjunctivae and EOM are normal. Pupils are equal, round, and reactive to light.  Neck: Normal range of motion. Neck supple. No thyromegaly present.  Cardiovascular: Normal rate, regular rhythm, normal heart sounds and intact distal pulses.   Pulmonary/Chest: Effort normal and breath sounds normal.  Abdominal: Soft. Bowel sounds are normal. She exhibits no mass. There is no tenderness.  Musculoskeletal: Normal range of motion.       Trace right ankle edema  Lymphadenopathy:    She has no cervical adenopathy.  Neurological: She is alert and oriented to person, place, and time.  Skin: Skin is warm and dry. No rash noted.  Psychiatric: She has a normal mood and affect. Her behavior is normal.          Assessment & Plan:   Weight loss. Probably secondary to improve diet. Will recheck a sedimentation rate Diabetes mellitus. We'll check a hemoglobin A1c. Also much improved since that her diet and weight loss Hypertension stable Congestive heart failure compensated Atrial fibrillation

## 2011-01-31 LAB — CBC WITH DIFFERENTIAL/PLATELET
Basophils Absolute: 0.2 10*3/uL — ABNORMAL HIGH (ref 0.0–0.1)
Eosinophils Absolute: 0.1 10*3/uL (ref 0.0–0.7)
Hemoglobin: 13.8 g/dL (ref 12.0–15.0)
Lymphocytes Relative: 19.6 % (ref 12.0–46.0)
Lymphs Abs: 1.7 10*3/uL (ref 0.7–4.0)
MCHC: 33.2 g/dL (ref 30.0–36.0)
Neutro Abs: 6.1 10*3/uL (ref 1.4–7.7)
RDW: 14.7 % — ABNORMAL HIGH (ref 11.5–14.6)

## 2011-01-31 LAB — HEMOGLOBIN A1C: Hgb A1c MFr Bld: 6.3 % (ref 4.6–6.5)

## 2011-02-02 ENCOUNTER — Other Ambulatory Visit: Payer: Self-pay | Admitting: Internal Medicine

## 2011-02-02 ENCOUNTER — Telehealth: Payer: Self-pay | Admitting: *Deleted

## 2011-02-02 NOTE — Telephone Encounter (Signed)
rx was sent to [facility]

## 2011-02-02 NOTE — Telephone Encounter (Signed)
FYI! Pt would like to change pharmacy to [facility]

## 2011-02-02 NOTE — Telephone Encounter (Signed)
Pt and family are confused on which pharmacy the Diazepam was called in????  Please call to discuss where her meds were sent.

## 2011-02-02 NOTE — Telephone Encounter (Signed)
Spoke with daughter - informed printing rx give at 9/10 rov - pt now states she took [facility] to [facility].

## 2011-02-20 ENCOUNTER — Other Ambulatory Visit (INDEPENDENT_AMBULATORY_CARE_PROVIDER_SITE_OTHER): Payer: Medicare Other

## 2011-02-20 DIAGNOSIS — I82409 Acute embolism and thrombosis of unspecified deep veins of unspecified lower extremity: Secondary | ICD-10-CM

## 2011-02-20 NOTE — Patient Instructions (Signed)
°  Latest dosing instructions  ° Total Sun Mon Tue Wed Thu Fri Sat  ° 10 2.5 mg  2.5 mg  2.5 mg  2.5 mg  °  (5 mg×0.5)  (5 mg×0.5)  (5 mg×0.5)  (5 mg×0.5)  °  °  ° ° °

## 2011-02-23 ENCOUNTER — Other Ambulatory Visit (INDEPENDENT_AMBULATORY_CARE_PROVIDER_SITE_OTHER): Payer: Medicare Other

## 2011-02-23 DIAGNOSIS — I82409 Acute embolism and thrombosis of unspecified deep veins of unspecified lower extremity: Secondary | ICD-10-CM

## 2011-02-23 NOTE — Patient Instructions (Signed)
  Latest dosing instructions   Total Sun Mon Tue Wed Thu Fri Sat   12.5 2.5 mg  2.5 mg 2.5 mg 2.5 mg  2.5 mg    (5 mg0.5)  (5 mg0.5) (5 mg0.5) (5 mg0.5)  (5 mg0.5)        

## 2011-02-24 ENCOUNTER — Ambulatory Visit: Payer: Medicare Other | Admitting: Internal Medicine

## 2011-03-02 ENCOUNTER — Telehealth: Payer: Self-pay | Admitting: Internal Medicine

## 2011-03-02 ENCOUNTER — Encounter: Payer: Self-pay | Admitting: Internal Medicine

## 2011-03-02 ENCOUNTER — Ambulatory Visit (INDEPENDENT_AMBULATORY_CARE_PROVIDER_SITE_OTHER): Payer: Medicare Other | Admitting: Internal Medicine

## 2011-03-02 DIAGNOSIS — E119 Type 2 diabetes mellitus without complications: Secondary | ICD-10-CM

## 2011-03-02 DIAGNOSIS — I10 Essential (primary) hypertension: Secondary | ICD-10-CM

## 2011-03-02 DIAGNOSIS — I509 Heart failure, unspecified: Secondary | ICD-10-CM

## 2011-03-02 NOTE — Telephone Encounter (Signed)
ok 

## 2011-03-02 NOTE — Telephone Encounter (Signed)
Patient requests to become a patient of Dr.Kwiatkowski

## 2011-03-02 NOTE — Progress Notes (Signed)
  Subjective:    Patient ID: [name], female    DOB: 13-Feb-1927, 77 y.o.   MRN: [id number]  HPI  77 year old patient who is seen with a number of concerns. She is on chronic Coumadin anticoagulation and after trauma to the dorsal aspect of her left hand she has had a asymptomatic nodule. She states that she has had side effects with Zoloft and tramadol and has discontinued both these medications. She is using Tylenol only for pain. She continues to have occasional low back pain. She has type 2 diabetes. Laboratory studies were done last month and were unremarkable. Hemoglobin A1c was well controlled.    Review of Systems  Musculoskeletal: Positive for back pain.  Skin: Positive for wound.       Objective:   Physical Exam  Constitutional: She appears well-developed and well-nourished. No distress.       Blood pressure 130/72  Skin: Rash noted.       1.5 cm hemorrhagic bulla dorsal aspect left hand          Assessment & Plan:   Hemorrhagic bulla dorsal aspect left hand. Local wound care discussed Hypertension well controlled Type 2 diabetes stable  We'll recheck in 3 months

## 2011-03-02 NOTE — Patient Instructions (Signed)
Limit your sodium (Salt) intake  Return in 3 months for follow-up   Please check your hemoglobin A1c every 3 months  

## 2011-03-09 ENCOUNTER — Other Ambulatory Visit: Payer: Self-pay | Admitting: Internal Medicine

## 2011-03-09 MED ORDER — HYOSCYAMINE SULFATE CR 0.375 MG PO CP12: 0.3750 mg | ORAL_CAPSULE | Freq: Two times a day (BID) | ORAL | Status: DC | PRN

## 2011-03-17 NOTE — Telephone Encounter (Signed)
Patient was notified of PCP change.

## 2011-03-30 ENCOUNTER — Ambulatory Visit (INDEPENDENT_AMBULATORY_CARE_PROVIDER_SITE_OTHER): Payer: Medicare Other | Admitting: Internal Medicine

## 2011-03-30 ENCOUNTER — Encounter: Payer: Self-pay | Admitting: Internal Medicine

## 2011-03-30 DIAGNOSIS — Z950 Presence of cardiac pacemaker: Secondary | ICD-10-CM

## 2011-03-30 DIAGNOSIS — I4891 Unspecified atrial fibrillation: Secondary | ICD-10-CM

## 2011-03-30 DIAGNOSIS — I509 Heart failure, unspecified: Secondary | ICD-10-CM

## 2011-03-30 LAB — PACEMAKER DEVICE OBSERVATION
BMOD-0003RV: 30
BRDY-0002RV: 75 {beats}/min
BRDY-0004RV: 115 {beats}/min
RV LEAD THRESHOLD: 0.5 V
VENTRICULAR PACING PM: 100

## 2011-03-30 NOTE — Assessment & Plan Note (Signed)
She is s/p AV node ablation. Her ventricular rate is well controlled. She will continue her anti-coagulation.

## 2011-03-30 NOTE — Assessment & Plan Note (Signed)
Her diastolic heart failure is well controlled. She will continue a low sodium diet.

## 2011-03-30 NOTE — Progress Notes (Signed)
HPI [name] returns today for followup. She is an 77 year old woman with a history of persistent atrial fibrillation status post pacemaker status post AV node ablation. She also has hypertension and diastolic heart failure. She has been on chronic Coumadin therapy for thromboembolic prevention. She notes that several weeks ago, she had auditory hallucinations and one of her meds was stopped and she now feels better with no halucinations. Allergies  Allergen Reactions  . Codeine Sulfate     REACTION: unspecified  . Simvastatin     REACTION: choking, acid reflux     Current Outpatient Prescriptions  Medication Sig Dispense Refill  . diazepam (VALIUM) 5 MG tablet TAKE 1 TABLET BY MOUTH EVERY 8 HOURS AS NEEDED FOR ANXIETY OR SLEEP  30 tablet  0  . digoxin (LANOXIN) 0.125 MG tablet Take 1 tablet (125 mcg total) by mouth daily.  30 tablet  11  . diltiazem (CARDIZEM CD) 300 MG 24 hr capsule Take 1 capsule (300 mg total) by mouth daily.  30 capsule  11  . ferrous sulfate 325 (65 FE) MG tablet Take 325 mg by mouth daily with breakfast.        . glimepiride (AMARYL) 4 MG tablet Take 1 tablet (4 mg total) by mouth daily before breakfast.  30 tablet  5  . glucose blood (FREESTYLE LITE) test strip Use as instructed  100 each  12  . hyoscyamine (LEVSINEX) 0.375 MG 12 hr capsule Take 1 capsule (0.375 mg total) by mouth 2 (two) times daily as needed for cramping.  60 capsule  0  . KLOR-CON M10 10 MEQ tablet Take 10 mEq by mouth daily.       . Lancets MISC       . levothyroxine (SYNTHROID, LEVOTHROID) 75 MCG tablet TAKE 1 TABLET EVERY DAY  30 tablet  4  . sertraline (ZOLOFT) 50 MG tablet Take 50 mg by mouth daily.       . simethicone (MYLICON) 125 MG chewable tablet Chew 125 mg by mouth every 6 (six) hours as needed.        . warfarin (COUMADIN) 2 MG tablet Take 1 tablet (2 mg total) by mouth daily.  30 tablet  11     Past Medical History  Diagnosis Date  . Osteoporosis   . Hyperlipidemia   .  Tremor, essential   . CHF (congestive heart failure)   . Type II or unspecified type diabetes mellitus with neurological manifestations, not stated as uncontrolled   . Hypertension   . Thyroid disease   . Diverticulosis of colon   . Atrial fibrillation   . Anxiety   . DM (diabetes mellitus)   . Pain in left ear   . Hypothyroidism   . Atrial fibrillation   . Constipation     ROS:   All systems reviewed and negative except as noted in the HPI.   Past Surgical History  Procedure Date  . Cataract extraction 2002  . Pacemaker placement   . L3 compression fraction   . Transthoracic echocardiogram 2011, 2012     Family History  Problem Relation Age of Onset  . Heart attack Father   . Colon cancer Neg Hx      History   Social History  . Marital Status: Widowed    Spouse Name: N/A    Number of Children: N/A  . Years of Education: N/A   Occupational History  . Not on file.   Social History Main Topics  . Smoking  status: Never Smoker   . Smokeless tobacco: Not on file  . Alcohol Use: No  . Drug Use: No  . Sexually Active: Not on file   Other Topics Concern  . Not on file   Social History Narrative  . No narrative on file     BP 150/80  Pulse 81  Ht 5\' 5"  (1.651 m)  Wt 145 lb 6.4 oz (65.953 kg)  BMI 24.20 kg/m2  Physical Exam:  Well appearing elderly woman, NAD HEENT: Unremarkable Neck:  No JVD, no thyromegally Lymphatics:  No adenopathy Back:  No CVA tenderness Lungs:  Clear with rare basilar rales. Well healed PPM incision. HEART:  Regular rate rhythm, 2/6 systolic murmur at the base. Abd:  soft, positive bowel sounds, no organomegally, no rebound, no guarding Ext:  2 plus pulses, no edema, no cyanosis, no clubbing Skin:  No rashes no nodules Neuro:  CN II through XII intact, motor grossly intact  DEVICE  Normal device function.  See PaceArt for details.   Assess/Plan:

## 2011-03-30 NOTE — Assessment & Plan Note (Signed)
Her rate is turned down today to 70/min. No other changes.

## 2011-03-30 NOTE — Patient Instructions (Signed)
Your physician wants you to follow-up in: 6 months with Dr. Taylor. You will receive a reminder letter in the mail two months in advance. If you don't receive a letter, please call our office to schedule the follow-up appointment.  Your physician recommends that you continue on your current medications as directed. Please refer to the Current Medication list given to you today.  

## 2011-04-10 ENCOUNTER — Other Ambulatory Visit: Payer: Self-pay | Admitting: Internal Medicine

## 2011-04-19 ENCOUNTER — Ambulatory Visit (INDEPENDENT_AMBULATORY_CARE_PROVIDER_SITE_OTHER): Payer: Medicare Other

## 2011-04-19 DIAGNOSIS — Z23 Encounter for immunization: Secondary | ICD-10-CM

## 2011-04-19 DIAGNOSIS — I82409 Acute embolism and thrombosis of unspecified deep veins of unspecified lower extremity: Secondary | ICD-10-CM

## 2011-04-19 DIAGNOSIS — Z0000 Encounter for general adult medical examination without abnormal findings: Secondary | ICD-10-CM

## 2011-04-19 NOTE — Patient Instructions (Signed)
  Latest dosing instructions   Total Sun Mon Tue Wed Thu Fri Sat   12.5 2.5 mg  2.5 mg 2.5 mg 2.5 mg  2.5 mg    (5 mg0.5)  (5 mg0.5) (5 mg0.5) (5 mg0.5)  (5 mg0.5)        

## 2011-04-24 ENCOUNTER — Other Ambulatory Visit: Payer: Self-pay | Admitting: Internal Medicine

## 2011-04-26 ENCOUNTER — Ambulatory Visit (INDEPENDENT_AMBULATORY_CARE_PROVIDER_SITE_OTHER): Payer: Medicare Other

## 2011-04-26 DIAGNOSIS — I82409 Acute embolism and thrombosis of unspecified deep veins of unspecified lower extremity: Secondary | ICD-10-CM

## 2011-04-26 LAB — POCT INR: INR: 2

## 2011-04-26 NOTE — Patient Instructions (Signed)
  Latest dosing instructions   Total Sun Mon Tue Wed Thu Fri Sat   12.5 2.5 mg  2.5 mg 2.5 mg 2.5 mg  2.5 mg    (5 mg0.5)  (5 mg0.5) (5 mg0.5) (5 mg0.5)  (5 mg0.5)        

## 2011-04-27 ENCOUNTER — Encounter: Payer: Self-pay | Admitting: Internal Medicine

## 2011-04-27 ENCOUNTER — Ambulatory Visit (INDEPENDENT_AMBULATORY_CARE_PROVIDER_SITE_OTHER): Payer: Medicare Other | Admitting: Internal Medicine

## 2011-04-27 DIAGNOSIS — I509 Heart failure, unspecified: Secondary | ICD-10-CM

## 2011-04-27 DIAGNOSIS — I10 Essential (primary) hypertension: Secondary | ICD-10-CM

## 2011-04-27 DIAGNOSIS — J069 Acute upper respiratory infection, unspecified: Secondary | ICD-10-CM

## 2011-04-27 DIAGNOSIS — E119 Type 2 diabetes mellitus without complications: Secondary | ICD-10-CM

## 2011-04-27 MED ORDER — BENZONATATE 100 MG PO CAPS: 100.0000 mg | ORAL_CAPSULE | Freq: Three times a day (TID) | ORAL | Status: DC | PRN

## 2011-04-27 NOTE — Patient Instructions (Signed)
Get plenty of rest, Drink lots of  clear liquids, and use Tylenol or ibuprofen for fever and discomfort.    Mucinex DM one  tablet twice daily   Please check your hemoglobin A1c every 3 months

## 2011-04-27 NOTE — Progress Notes (Signed)
  Subjective:    Patient ID: [name], female    DOB: 09-Mar-1927, 77 y.o.   MRN: [id number]  HPI an 77 year old patient who has a history of type 2 diabetes. This has been treated with Amaryl.  Her last hemoglobin A1c was 5 months ago and was 8.0. She presents with a six-day history of head and chest congestion and productive cough. Earlier she had myalgias and fever she feels improved and the latter symptoms have resolved. She also describes some mild sore throat and right ear discomfort    Review of Systems  Constitutional: Positive for fever and fatigue.  HENT: Positive for sore throat, rhinorrhea and sinus pressure. Negative for hearing loss, congestion, dental problem and tinnitus.   Eyes: Negative for pain, discharge and visual disturbance.  Respiratory: Positive for cough. Negative for shortness of breath.   Cardiovascular: Negative for chest pain, palpitations and leg swelling.  Gastrointestinal: Negative for nausea, vomiting, abdominal pain, diarrhea, constipation, blood in stool and abdominal distention.  Genitourinary: Negative for dysuria, urgency, frequency, hematuria, flank pain, vaginal bleeding, vaginal discharge, difficulty urinating, vaginal pain and pelvic pain.  Musculoskeletal: Negative for joint swelling, arthralgias and gait problem.  Skin: Negative for rash.  Neurological: Positive for weakness. Negative for dizziness, syncope, speech difficulty, numbness and headaches.  Hematological: Negative for adenopathy.  Psychiatric/Behavioral: Negative for behavioral problems, dysphoric mood and agitation. The patient is not nervous/anxious.        Objective:   Physical Exam  Constitutional: She is oriented to person, place, and time. She appears well-developed and well-nourished.  HENT:  Head: Normocephalic.  Right Ear: External ear normal.  Left Ear: External ear normal.  Mouth/Throat: Oropharynx is clear and moist.  Eyes: Conjunctivae and EOM are normal.  Pupils are equal, round, and reactive to light.  Neck: Normal range of motion. Neck supple. No thyromegaly present.  Cardiovascular: Normal rate, regular rhythm, normal heart sounds and intact distal pulses.   Pulmonary/Chest: Effort normal and breath sounds normal.       Clear. O2 saturation 98%  Abdominal: Soft. Bowel sounds are normal. She exhibits no mass. There is no tenderness.  Musculoskeletal: Normal range of motion.  Lymphadenopathy:    She has no cervical adenopathy.  Neurological: She is alert and oriented to person, place, and time.  Skin: Skin is warm and dry. No rash noted.  Psychiatric: She has a normal mood and affect. Her behavior is normal.          Assessment & Plan:  Viral URI. Flu syndrome improved We'll treat symptomatically. Force fluids and Mucinex. Diabetes mellitus. Unclear control. We'll check a hemoglobin A1c

## 2011-05-10 ENCOUNTER — Ambulatory Visit (INDEPENDENT_AMBULATORY_CARE_PROVIDER_SITE_OTHER): Payer: Medicare Other | Admitting: Internal Medicine

## 2011-05-10 DIAGNOSIS — I82409 Acute embolism and thrombosis of unspecified deep veins of unspecified lower extremity: Secondary | ICD-10-CM

## 2011-05-10 LAB — POCT INR: INR: 2.8

## 2011-05-10 NOTE — Patient Instructions (Signed)
  Latest dosing instructions   Total Sun Mon Tue Wed Thu Fri Sat   12.5 2.5 mg 2.5 mg 2.5 mg 2.5 mg   2.5 mg    (5 mg0.5) (5 mg0.5) (5 mg0.5) (5 mg0.5)   (5 mg0.5)

## 2011-05-22 ENCOUNTER — Other Ambulatory Visit: Payer: Self-pay | Admitting: Internal Medicine

## 2011-05-25 ENCOUNTER — Telehealth: Payer: Self-pay | Admitting: Internal Medicine

## 2011-05-25 MED ORDER — MECLIZINE HCL 25 MG PO TABS: 25.0000 mg | ORAL_TABLET | Freq: Four times a day (QID) | ORAL | Status: AC | PRN

## 2011-05-25 MED ORDER — MECLIZINE HCL 32 MG PO TABS: 32.0000 mg | ORAL_TABLET | Freq: Four times a day (QID) | ORAL | Status: DC | PRN

## 2011-05-25 NOTE — Telephone Encounter (Signed)
done

## 2011-05-25 NOTE — Telephone Encounter (Signed)
Meclizine 25 mg #50 one every 6 hours as needed for dizziness

## 2011-05-25 NOTE — Telephone Encounter (Signed)
Pt is requesting something for vertigo. [facility] [phone]  . Pt has experienced dizziness before

## 2011-05-25 NOTE — Telephone Encounter (Signed)
Corrected rx resent  

## 2011-05-25 NOTE — Telephone Encounter (Signed)
rx for meclizine was sent e-script. Please clarify the strength. It only comes in 12.5 or 25mg  as generic. No 32 mg exists only. Please return call. Thanks.

## 2011-06-04 ENCOUNTER — Other Ambulatory Visit: Payer: Self-pay | Admitting: Internal Medicine

## 2011-06-07 ENCOUNTER — Other Ambulatory Visit: Payer: Self-pay | Admitting: Internal Medicine

## 2011-06-07 NOTE — Telephone Encounter (Signed)
Pharmacy called. Rx for Freestyle lancets was denied, said new Rx to follow. Pharmacy states they have not received anything further. Perhaps E-scribe is acting up? Please get in touch with them/send new Rx. Thanks.

## 2011-06-14 ENCOUNTER — Other Ambulatory Visit: Payer: Self-pay | Admitting: Internal Medicine

## 2011-06-14 ENCOUNTER — Ambulatory Visit (INDEPENDENT_AMBULATORY_CARE_PROVIDER_SITE_OTHER): Payer: Medicare Other | Admitting: Internal Medicine

## 2011-06-14 DIAGNOSIS — I82409 Acute embolism and thrombosis of unspecified deep veins of unspecified lower extremity: Secondary | ICD-10-CM

## 2011-06-14 LAB — POCT INR: INR: 3.4

## 2011-06-14 MED ORDER — WARFARIN SODIUM 2 MG PO TABS: 2.0000 mg | ORAL_TABLET | Freq: Every day | ORAL | Status: DC

## 2011-06-14 NOTE — Patient Instructions (Signed)
  Latest dosing instructions   Total Sun Mon Tue Wed Thu Fri Sat   14 2 mg 2 mg 2 mg 2 mg 2 mg 2 mg 2 mg    (2 mg1) (2 mg1) (2 mg1) (2 mg1) (2 mg1) (2 mg1) (2 mg1)        

## 2011-06-14 NOTE — Progress Notes (Signed)
Addended by: Azucena Freed on: 06/14/2011 02:21 PM   Modules accepted: Orders

## 2011-06-21 ENCOUNTER — Ambulatory Visit: Payer: Medicare Other

## 2011-06-28 ENCOUNTER — Ambulatory Visit: Payer: Medicare Other

## 2011-06-28 DIAGNOSIS — I82409 Acute embolism and thrombosis of unspecified deep veins of unspecified lower extremity: Secondary | ICD-10-CM

## 2011-06-28 DIAGNOSIS — Z7901 Long term (current) use of anticoagulants: Secondary | ICD-10-CM

## 2011-06-28 DIAGNOSIS — Z5181 Encounter for therapeutic drug level monitoring: Secondary | ICD-10-CM

## 2011-06-28 LAB — POCT INR: INR: 1.5

## 2011-06-28 NOTE — Patient Instructions (Signed)
  Latest dosing instructions   Total Sun Mon Tue Wed Thu Fri Sat   14 2 mg 2 mg 2 mg 2 mg 2 mg 2 mg 2 mg    (2 mg1) (2 mg1) (2 mg1) (2 mg1) (2 mg1) (2 mg1) (2 mg1)        

## 2011-07-06 ENCOUNTER — Encounter: Payer: Self-pay | Admitting: Internal Medicine

## 2011-07-06 ENCOUNTER — Ambulatory Visit (INDEPENDENT_AMBULATORY_CARE_PROVIDER_SITE_OTHER): Payer: Medicare Other | Admitting: Internal Medicine

## 2011-07-06 VITALS — BP 130/82 | HR 79 | Temp 97.4°F | Wt 148.0 lb

## 2011-07-06 DIAGNOSIS — F411 Generalized anxiety disorder: Secondary | ICD-10-CM

## 2011-07-06 DIAGNOSIS — I4891 Unspecified atrial fibrillation: Secondary | ICD-10-CM

## 2011-07-06 DIAGNOSIS — I82409 Acute embolism and thrombosis of unspecified deep veins of unspecified lower extremity: Secondary | ICD-10-CM

## 2011-07-06 DIAGNOSIS — E119 Type 2 diabetes mellitus without complications: Secondary | ICD-10-CM

## 2011-07-06 DIAGNOSIS — I10 Essential (primary) hypertension: Secondary | ICD-10-CM

## 2011-07-06 MED ORDER — FLUTICASONE PROPIONATE 50 MCG/ACT NA SUSP: 2.0000 | Freq: Every day | NASAL | Status: DC

## 2011-07-06 NOTE — Progress Notes (Signed)
  Subjective:    Patient ID: [name], female    DOB: 1926/05/27, 78 y.o.   MRN: [id number]  HPI  78 year old patient who has a history of type 2 diabetes hypertension and paroxysmal atrial for correlation. She is doing quite well. Her only complaint is a 2 month history of some intermittent sinus fullness as well as mild dizziness. There's been no localized pain fever or purulent drainage. She does have a history of chronic  anxiety    Review of Systems  Constitutional: Negative.   HENT: Positive for postnasal drip and sinus pressure. Negative for hearing loss, congestion, sore throat, rhinorrhea, dental problem and tinnitus.   Eyes: Negative for pain, discharge and visual disturbance.  Respiratory: Negative for cough and shortness of breath.   Cardiovascular: Negative for chest pain, palpitations and leg swelling.  Gastrointestinal: Negative for nausea, vomiting, abdominal pain, diarrhea, constipation, blood in stool and abdominal distention.  Genitourinary: Negative for dysuria, urgency, frequency, hematuria, flank pain, vaginal bleeding, vaginal discharge, difficulty urinating, vaginal pain and pelvic pain.  Musculoskeletal: Negative for joint swelling, arthralgias and gait problem.  Skin: Negative for rash.  Neurological: Negative for dizziness, syncope, speech difficulty, weakness, numbness and headaches.  Hematological: Negative for adenopathy.  Psychiatric/Behavioral: Negative for behavioral problems, dysphoric mood and agitation. The patient is not nervous/anxious.        Objective:   Physical Exam  Constitutional: She is oriented to person, place, and time. She appears well-developed and well-nourished.  HENT:  Head: Normocephalic.  Right Ear: External ear normal.  Left Ear: External ear normal.  Mouth/Throat: Oropharynx is clear and moist.  Eyes: Conjunctivae and EOM are normal. Pupils are equal, round, and reactive to light.  Neck: Normal range of motion. Neck  supple. No thyromegaly present.  Cardiovascular: Normal rate, regular rhythm, normal heart sounds and intact distal pulses.   Pulmonary/Chest: Effort normal and breath sounds normal.  Abdominal: Soft. Bowel sounds are normal. She exhibits no mass. There is no tenderness.  Musculoskeletal: Normal range of motion.  Lymphadenopathy:    She has no cervical adenopathy.  Neurological: She is alert and oriented to person, place, and time.  Skin: Skin is warm and dry. No rash noted.  Psychiatric: She has a normal mood and affect. Her behavior is normal.          Assessment & Plan:    Diabetes mellitus. Stable hemoglobin A1c of 6.3 in December Hypertension well controlled. Repeat blood pressure 120/72 Paroxysmal atrial fibrillation  Recheck 3 months Low-salt diet recommended

## 2011-07-06 NOTE — Patient Instructions (Addendum)
Please check your hemoglobin A1c every 3 months    It is important that you exercise regularly, at least 20 minutes 3 to 4 times per week.  If you develop chest pain or shortness of breath seek  medical attention.    Latest dosing instructions   Total Sun Mon Tue Wed Thu Fri Sat   14 2 mg 2 mg 2 mg 2 mg 2 mg 2 mg 2 mg    (2 mg1) (2 mg1) (2 mg1) (2 mg1) (2 mg1) (2 mg1) (2 mg1)

## 2011-07-13 ENCOUNTER — Other Ambulatory Visit: Payer: Self-pay | Admitting: Internal Medicine

## 2011-07-24 ENCOUNTER — Encounter: Payer: Self-pay | Admitting: Internal Medicine

## 2011-07-24 ENCOUNTER — Ambulatory Visit (INDEPENDENT_AMBULATORY_CARE_PROVIDER_SITE_OTHER): Payer: Medicare Other | Admitting: *Deleted

## 2011-07-24 DIAGNOSIS — I495 Sick sinus syndrome: Secondary | ICD-10-CM

## 2011-07-24 LAB — PACEMAKER DEVICE OBSERVATION
BATTERY VOLTAGE: 2.79 V
BMOD-0003RV: 30
BRDY-0004RV: 115 {beats}/min
DEVICE MODEL PM: 261399
RV LEAD AMPLITUDE: 11.2 mv
RV LEAD THRESHOLD: 0.75 V
VENTRICULAR PACING PM: 100

## 2011-07-24 NOTE — Progress Notes (Signed)
PPM check 

## 2011-08-03 ENCOUNTER — Ambulatory Visit: Payer: Medicare Other | Admitting: Internal Medicine

## 2011-08-03 ENCOUNTER — Ambulatory Visit: Payer: Medicare Other

## 2011-08-03 DIAGNOSIS — Z7901 Long term (current) use of anticoagulants: Secondary | ICD-10-CM

## 2011-08-03 DIAGNOSIS — I82409 Acute embolism and thrombosis of unspecified deep veins of unspecified lower extremity: Secondary | ICD-10-CM

## 2011-08-03 DIAGNOSIS — Z5181 Encounter for therapeutic drug level monitoring: Secondary | ICD-10-CM

## 2011-08-03 LAB — POCT INR: INR: 2.4

## 2011-08-03 NOTE — Patient Instructions (Signed)
  Latest dosing instructions   Total Sun Mon Tue Wed Thu Fri Sat   14 2 mg 2 mg 2 mg 2 mg 2 mg 2 mg 2 mg    (2 mg1) (2 mg1) (2 mg1) (2 mg1) (2 mg1) (2 mg1) (2 mg1)        

## 2011-08-07 ENCOUNTER — Other Ambulatory Visit: Payer: Self-pay | Admitting: Internal Medicine

## 2011-08-23 ENCOUNTER — Telehealth: Payer: Self-pay | Admitting: Internal Medicine

## 2011-08-23 NOTE — Telephone Encounter (Signed)
Opened in error

## 2011-09-04 ENCOUNTER — Other Ambulatory Visit: Payer: Self-pay | Admitting: Internal Medicine

## 2011-09-04 ENCOUNTER — Ambulatory Visit: Payer: Medicare Other

## 2011-09-04 DIAGNOSIS — Z7901 Long term (current) use of anticoagulants: Secondary | ICD-10-CM

## 2011-09-04 DIAGNOSIS — I82409 Acute embolism and thrombosis of unspecified deep veins of unspecified lower extremity: Secondary | ICD-10-CM

## 2011-09-04 LAB — POCT INR: INR: 2.3

## 2011-09-04 MED ORDER — FLUTICASONE PROPIONATE 50 MCG/ACT NA SUSP: 2.0000 | Freq: Every day | NASAL | Status: DC

## 2011-09-04 NOTE — Telephone Encounter (Signed)
Should have refills avibl but will sen 2 more to pharmacy

## 2011-09-04 NOTE — Telephone Encounter (Signed)
Patient stated she need a refill on her flonase. Please assist.

## 2011-09-04 NOTE — Patient Instructions (Signed)
  Latest dosing instructions   Total Sun Mon Tue Wed Thu Fri Sat   14 2 mg 2 mg 2 mg 2 mg 2 mg 2 mg 2 mg    (2 mg1) (2 mg1) (2 mg1) (2 mg1) (2 mg1) (2 mg1) (2 mg1)        

## 2011-09-14 ENCOUNTER — Ambulatory Visit (INDEPENDENT_AMBULATORY_CARE_PROVIDER_SITE_OTHER): Payer: Medicare Other | Admitting: Internal Medicine

## 2011-09-14 ENCOUNTER — Encounter: Payer: Self-pay | Admitting: Internal Medicine

## 2011-09-14 VITALS — BP 130/80 | Temp 97.6°F | Wt 147.0 lb

## 2011-09-14 DIAGNOSIS — I509 Heart failure, unspecified: Secondary | ICD-10-CM

## 2011-09-14 DIAGNOSIS — I4891 Unspecified atrial fibrillation: Secondary | ICD-10-CM

## 2011-09-14 DIAGNOSIS — I82419 Acute embolism and thrombosis of unspecified femoral vein: Secondary | ICD-10-CM

## 2011-09-14 DIAGNOSIS — E119 Type 2 diabetes mellitus without complications: Secondary | ICD-10-CM

## 2011-09-14 DIAGNOSIS — I824Y9 Acute embolism and thrombosis of unspecified deep veins of unspecified proximal lower extremity: Secondary | ICD-10-CM

## 2011-09-14 MED ORDER — FUROSEMIDE 20 MG PO TABS: 20.0000 mg | ORAL_TABLET | Freq: Every day | ORAL | Status: DC

## 2011-09-14 NOTE — Progress Notes (Signed)
  Subjective:    Patient ID: [name], female    DOB: 1927/01/16, 78 y.o.   MRN: [id number]  HPI   78 year old patient who has a history of atrial fibrillation who has been on chronic Coumadin anticoagulation. She has a history of congestive heart failure and also history of right leg DVT. She presents today complaining of increase in swelling involving the right foot. Her INR one week ago was therapeutic. She is type 2 diabetes. Denies any symptoms of congestive heart failure. She is generally polysomatic.  Wt Readings from Last 3 Encounters:  09/14/11 147 lb (66.679 kg)  07/06/11 148 lb (67.132 kg)  04/27/11 144 lb (65.318 kg)    Review of Systems  Constitutional: Positive for fatigue.  HENT: Positive for hearing loss. Negative for congestion, sore throat, rhinorrhea, dental problem, sinus pressure and tinnitus.   Eyes: Negative for pain, discharge and visual disturbance.  Respiratory: Negative for cough and shortness of breath.   Cardiovascular: Positive for leg swelling. Negative for chest pain and palpitations.  Gastrointestinal: Positive for constipation. Negative for nausea, vomiting, abdominal pain, diarrhea, blood in stool and abdominal distention.  Genitourinary: Negative for dysuria, urgency, frequency, hematuria, flank pain, vaginal bleeding, vaginal discharge, difficulty urinating, vaginal pain and pelvic pain.  Musculoskeletal: Positive for back pain and arthralgias. Negative for joint swelling and gait problem.  Skin: Negative for rash.  Neurological: Negative for dizziness, syncope, speech difficulty, weakness, numbness and headaches.  Hematological: Negative for adenopathy.  Psychiatric/Behavioral: Negative for behavioral problems, dysphoric mood and agitation. The patient is nervous/anxious.        Objective:   Physical Exam  Constitutional: She is oriented to person, place, and time. She appears well-developed and well-nourished.  HENT:  Head: Normocephalic.    Right Ear: External ear normal.  Left Ear: External ear normal.  Mouth/Throat: Oropharynx is clear and moist.       Minimal cerumen right canal  Eyes: Conjunctivae and EOM are normal. Pupils are equal, round, and reactive to light.  Neck: Normal range of motion. Neck supple. No thyromegaly present.  Cardiovascular: Normal rate, regular rhythm and normal heart sounds.        The left posterior tibial and the right dorsalis pedis pulse full  Pulmonary/Chest: Effort normal and breath sounds normal.        O2 saturation 98  Abdominal: Soft. Bowel sounds are normal. She exhibits no mass. There is no tenderness.  Musculoskeletal: Normal range of motion. She exhibits edema.       Edema involving the right ankle and foot  Lymphadenopathy:    She has no cervical adenopathy.  Neurological: She is alert and oriented to person, place, and time.  Skin: Skin is warm and dry. No rash noted.  Psychiatric: She has a normal mood and affect. Her behavior is normal.          Assessment & Plan:   Right lower extremity edema History congestive heart failure. Appears well compensated Chronic atrial fibrillation Chronic Coumadin anticoagulation Diabetes mellitus Will place on furosemide 20 mg daily Hemoglobin A1c checked Recheck in 2 weeks

## 2011-09-14 NOTE — Patient Instructions (Signed)
Limit your sodium (Salt) intake  Keep your legs elevated as much as possible  Furosemide 20 mg every morning  Recheck in 2 weeks

## 2011-09-21 ENCOUNTER — Other Ambulatory Visit: Payer: Self-pay | Admitting: Internal Medicine

## 2011-09-28 ENCOUNTER — Encounter: Payer: Self-pay | Admitting: Internal Medicine

## 2011-09-28 ENCOUNTER — Ambulatory Visit (INDEPENDENT_AMBULATORY_CARE_PROVIDER_SITE_OTHER): Payer: Medicare Other | Admitting: Internal Medicine

## 2011-09-28 VITALS — BP 120/62 | Temp 98.1°F | Wt 149.0 lb

## 2011-09-28 DIAGNOSIS — E119 Type 2 diabetes mellitus without complications: Secondary | ICD-10-CM

## 2011-09-28 DIAGNOSIS — I4891 Unspecified atrial fibrillation: Secondary | ICD-10-CM

## 2011-09-28 DIAGNOSIS — R350 Frequency of micturition: Secondary | ICD-10-CM

## 2011-09-28 LAB — POCT URINALYSIS DIPSTICK
Spec Grav, UA: 1.01
Urobilinogen, UA: 0.2
pH, UA: 6

## 2011-09-28 LAB — GLUCOSE, POCT (MANUAL RESULT ENTRY): POC Glucose: 183

## 2011-09-28 MED ORDER — CIPROFLOXACIN HCL 500 MG PO TABS: 500.0000 mg | ORAL_TABLET | Freq: Two times a day (BID) | ORAL | Status: DC

## 2011-09-28 NOTE — Progress Notes (Signed)
  Subjective:    Patient ID: [name], female    DOB: Nov 13, 1926, 78 y.o.   MRN: [id number]  HPI  78 year old patient who is seen today for followup. She presents with the chief complaint of urinary frequency especially at night she also describes some mild low back pain. She has required a urethral dilatation in the past by Dr. Aldean Ast. No fever chills or flank pain. She does have a history of diabetes. Her hemoglobin A1c 1 month ago was nicely controlled.    Review of Systems  Genitourinary: Positive for dysuria and frequency.       Objective:   Physical Exam  Constitutional: She is oriented to person, place, and time. She appears well-nourished. No distress.  HENT:  Head: Normocephalic.  Right Ear: External ear normal.  Left Ear: External ear normal.  Neck: Neck supple. No thyromegaly present.  Cardiovascular: Normal rate, regular rhythm, normal heart sounds and intact distal pulses.   Pulmonary/Chest: Effort normal and breath sounds normal.  Abdominal: Soft. Bowel sounds are normal. She exhibits no mass. There is no tenderness.       No CVA tenderness  Musculoskeletal: Normal range of motion.  Lymphadenopathy:    She has no cervical adenopathy.  Neurological: She is alert and oriented to person, place, and time.  Skin: Skin is warm and dry. No rash noted.  Psychiatric: She has a normal mood and affect. Her behavior is normal.          Assessment & Plan:   Acute UTI. Will treat with Cipro for 7 days Diabetes mellitus well-controlled Coumadin anticoagulation. We'll check an INR today

## 2011-09-28 NOTE — Patient Instructions (Addendum)
Take your antibiotic as prescribed until ALL of it is gone, but stop if you develop a rash, swelling, or any side effects of the medication.  Contact our office as soon as possible if  there are side effects of the medication.  Return in 3 months for follow-up    Latest dosing instructions   Total Sun Mon Tue Wed Thu Fri Sat   14 2 mg 2 mg 2 mg 2 mg 2 mg 2 mg 2 mg    (2 mg1) (2 mg1) (2 mg1) (2 mg1) (2 mg1) (2 mg1) (2 mg1)

## 2011-09-29 ENCOUNTER — Other Ambulatory Visit: Payer: Self-pay | Admitting: Internal Medicine

## 2011-09-29 ENCOUNTER — Ambulatory Visit: Payer: Medicare Other | Admitting: Internal Medicine

## 2011-10-03 ENCOUNTER — Ambulatory Visit: Payer: Medicare Other | Admitting: Internal Medicine

## 2011-10-12 ENCOUNTER — Telehealth: Payer: Self-pay | Admitting: Internal Medicine

## 2011-10-12 NOTE — Telephone Encounter (Signed)
After speaking with pt- she is unsure of any med supply co she uses.

## 2011-10-12 NOTE — Telephone Encounter (Signed)
[facility] called to check on status of Diabetic Testing Supplies. Req was faxed over on 10/06/11 and again on 10/11/11. Pls fax back asap to # on form.

## 2011-10-16 ENCOUNTER — Other Ambulatory Visit: Payer: Self-pay | Admitting: Internal Medicine

## 2011-10-17 ENCOUNTER — Other Ambulatory Visit: Payer: Self-pay | Admitting: Internal Medicine

## 2011-10-19 ENCOUNTER — Encounter: Payer: Self-pay | Admitting: Internal Medicine

## 2011-10-19 ENCOUNTER — Ambulatory Visit (INDEPENDENT_AMBULATORY_CARE_PROVIDER_SITE_OTHER): Payer: Medicare Other | Admitting: Internal Medicine

## 2011-10-19 ENCOUNTER — Ambulatory Visit (INDEPENDENT_AMBULATORY_CARE_PROVIDER_SITE_OTHER): Payer: Medicare Other | Admitting: Family

## 2011-10-19 VITALS — BP 116/70 | Temp 98.3°F | Wt 146.0 lb

## 2011-10-19 DIAGNOSIS — I509 Heart failure, unspecified: Secondary | ICD-10-CM

## 2011-10-19 DIAGNOSIS — E119 Type 2 diabetes mellitus without complications: Secondary | ICD-10-CM

## 2011-10-19 DIAGNOSIS — I10 Essential (primary) hypertension: Secondary | ICD-10-CM

## 2011-10-19 DIAGNOSIS — I4891 Unspecified atrial fibrillation: Secondary | ICD-10-CM

## 2011-10-19 DIAGNOSIS — I82409 Acute embolism and thrombosis of unspecified deep veins of unspecified lower extremity: Secondary | ICD-10-CM

## 2011-10-19 DIAGNOSIS — F411 Generalized anxiety disorder: Secondary | ICD-10-CM

## 2011-10-19 LAB — POCT INR: INR: 2.4

## 2011-10-19 NOTE — Progress Notes (Signed)
  Subjective:    Patient ID: [name], female    DOB: [birth year], 78 y.o.   MRN: [id number]  HPI  Wt Readings from Last 3 Encounters:  10/19/11 146 lb (66.225 kg)  09/28/11 149 lb (67.586 kg)  09/14/11 147 lb (66.22 kg)     78 year old patient who is seen today for followup. She presents with a chief complaint of swelling involving her right lateral foot. She dropped a heavy object on her foot several days ago. She does have a history of congestive heart failure but there's been no edema involving her left foot or leg. She has a history also of type 2 diabetes and is due for a hemoglobin A1c chronic medical problems include hypertension atrial fibrillation hypothyroidism. She has a chronic anxiety disorder. She has multiple somatic complaints but seems to be fairly stable.  Review of Systems  Constitutional: Negative.   HENT: Negative for hearing loss, congestion, sore throat, rhinorrhea, dental problem, sinus pressure and tinnitus.   Eyes: Negative for pain, discharge and visual disturbance.  Respiratory: Negative for cough and shortness of breath.   Cardiovascular: Negative for chest pain, palpitations and leg swelling.  Gastrointestinal: Negative for nausea, vomiting, abdominal pain, diarrhea, constipation, blood in stool and abdominal distention.  Genitourinary: Negative for dysuria, urgency, frequency, hematuria, flank pain, vaginal bleeding, vaginal discharge, difficulty urinating, vaginal pain and pelvic pain.  Musculoskeletal: Positive for joint swelling. Negative for arthralgias and gait problem.  Skin: Negative for rash.  Neurological: Negative for dizziness, syncope, speech difficulty, weakness, numbness and headaches.  Hematological: Negative for adenopathy.  Psychiatric/Behavioral: Negative for behavioral problems, dysphoric mood and agitation. The patient is not nervous/anxious.        Objective:   Physical Exam  Constitutional: She is oriented to person, place,  and time. She appears well-developed and well-nourished.  HENT:  Head: Normocephalic.  Right Ear: External ear normal.  Left Ear: External ear normal.  Mouth/Throat: Oropharynx is clear and moist.  Eyes: Conjunctivae and EOM are normal. Pupils are equal, round, and reactive to light.  Neck: Normal range of motion. Neck supple. No thyromegaly present.  Cardiovascular: Normal rate, regular rhythm, normal heart sounds and intact distal pulses.   Pulmonary/Chest: Effort normal and breath sounds normal. No respiratory distress. She has no wheezes. She has no rales.  Abdominal: Soft. Bowel sounds are normal. She exhibits no mass. There is no tenderness.  Musculoskeletal: Normal range of motion. She exhibits edema.       Edema involving the dorsal aspect of the right foot as well as the lateral ankle area. There is slight local tenderness to palpation  Lymphadenopathy:    She has no cervical adenopathy.  Neurological: She is alert and oriented to person, place, and time.  Skin: Skin is warm and dry. No rash noted.  Psychiatric: She has a normal mood and affect. Her behavior is normal.          Assessment & Plan:   Right foot edema secondary to trauma. No evidence of heart failure Diabetes mellitus. We'll check a hemoglobin A1c Chronic Coumadin anticoagulation. We'll check a prothrombin time Hypertension stable Chronic anxiety disorder. We'll set her for an annual physical in 3 months

## 2011-10-19 NOTE — Patient Instructions (Signed)
Limit your sodium (Salt) intake  Return in 3 months for follow-up   

## 2011-10-20 ENCOUNTER — Telehealth: Payer: Self-pay

## 2011-10-20 NOTE — Telephone Encounter (Signed)
Took phone call from sherry - daughter in law - has concerns - not on HIPPA -informed we could not discuss pt. KIK

## 2011-10-24 ENCOUNTER — Encounter: Payer: Medicare Other | Admitting: Internal Medicine

## 2011-10-27 ENCOUNTER — Encounter: Payer: Medicare Other | Admitting: Family

## 2011-10-31 ENCOUNTER — Encounter: Payer: Self-pay | Admitting: Internal Medicine

## 2011-11-07 ENCOUNTER — Other Ambulatory Visit: Payer: Self-pay | Admitting: Internal Medicine

## 2011-11-13 ENCOUNTER — Other Ambulatory Visit: Payer: Self-pay | Admitting: Internal Medicine

## 2011-11-16 ENCOUNTER — Ambulatory Visit (INDEPENDENT_AMBULATORY_CARE_PROVIDER_SITE_OTHER): Payer: Medicare Other | Admitting: Family

## 2011-11-16 DIAGNOSIS — I82409 Acute embolism and thrombosis of unspecified deep veins of unspecified lower extremity: Secondary | ICD-10-CM

## 2011-11-16 LAB — HM DIABETES EYE EXAM

## 2011-11-16 LAB — POCT INR: INR: 3.2

## 2011-11-16 NOTE — Patient Instructions (Addendum)
Hold Coumadin tomorrow (Friday) Then continue 2mg  a day. Check in 3 weeks    Latest dosing instructions   Total Sun Mon Tue Wed Thu Fri Sat   14 2 mg 2 mg 2 mg 2 mg 2 mg 2 mg 2 mg    (2 mg1) (2 mg1) (2 mg1) (2 mg1) (2 mg1) (2 mg1) (2 mg1)

## 2011-11-20 ENCOUNTER — Encounter: Payer: Self-pay | Admitting: Internal Medicine

## 2011-11-28 ENCOUNTER — Ambulatory Visit (INDEPENDENT_AMBULATORY_CARE_PROVIDER_SITE_OTHER): Payer: Medicare Other | Admitting: Internal Medicine

## 2011-11-28 ENCOUNTER — Encounter: Payer: Self-pay | Admitting: Internal Medicine

## 2011-11-28 VITALS — BP 120/68 | HR 63 | Ht 66.0 in | Wt 147.8 lb

## 2011-11-28 DIAGNOSIS — Z950 Presence of cardiac pacemaker: Secondary | ICD-10-CM

## 2011-11-28 DIAGNOSIS — I4891 Unspecified atrial fibrillation: Secondary | ICD-10-CM

## 2011-11-28 LAB — PACEMAKER DEVICE OBSERVATION
BRDY-0002RV: 70 {beats}/min
BRDY-0004RV: 115 {beats}/min
DEVICE MODEL PM: 261399

## 2011-11-28 NOTE — Patient Instructions (Signed)
Remote monitoring is used to monitor your Pacemaker of ICD from home. This monitoring reduces the number of office visits required to check your device to one time per year. It allows [place] to keep an eye on the functioning of your device to ensure it is working properly. You are scheduled for a device check from home on 03/04/2012. You may send your transmission at any time that day. If you have a wireless device, the transmission will be sent automatically. After your physician reviews your transmission, you will receive a postcard with your next transmission date.  Your physician wants you to follow-up in: 1 year with Dr. Ladona Ridgel.  You will receive a reminder letter in the mail two months in advance. If you don't receive a letter, please call our office to schedule the follow-up appointment.

## 2011-11-29 ENCOUNTER — Telehealth: Payer: Self-pay | Admitting: Internal Medicine

## 2011-11-29 ENCOUNTER — Encounter: Payer: Self-pay | Admitting: Internal Medicine

## 2011-11-29 NOTE — Telephone Encounter (Signed)
New msg Pt was here yesterday and she had some more questions about doing the transmission from home.

## 2011-11-29 NOTE — Assessment & Plan Note (Signed)
Her ventricular rate is well controlled. She will continue anti-coagulation.

## 2011-11-29 NOTE — Assessment & Plan Note (Signed)
Her device is working normally. Will recheck in several months. 

## 2011-11-29 NOTE — Progress Notes (Signed)
HPI [name] returns today for followup. She is a pleasant 78 yo woman with CHB (after AV node ablation), s/p PPM, HTN and diastolic CHF. She denies chest pain or syncope. She has chronic class 2 CHF. No edema.  Allergies  Allergen Reactions  . Codeine Sulfate     REACTION: unspecified  . Simvastatin     REACTION: choking, acid reflux     Current Outpatient Prescriptions  Medication Sig Dispense Refill  . benzonatate (TESSALON PERLES) 100 MG capsule Take 1 capsule (100 mg total) by mouth 3 (three) times daily as needed for cough.  30 capsule  0  . diazepam (VALIUM) 5 MG tablet TAKE 1 TABLET BY MOUTH EVERY 8 HOURS AS NEEDED FOR ANXIETY OF SLEEP  30 tablet  2  . digoxin (LANOXIN) 0.125 MG tablet take 1 tablet by mouth once daily  30 tablet  10  . diltiazem (CARDIZEM CD) 300 MG 24 hr capsule take 1 capsule by mouth once daily  90 capsule  3  . ferrous sulfate 325 (65 FE) MG tablet take 1 tablet by mouth once daily WITH BREAKFAST  30 tablet  5  . fluticasone (FLONASE) 50 MCG/ACT nasal spray Place 2 sprays into the nose daily.  16 g  1  . furosemide (LASIX) 20 MG tablet Take 1 tablet (20 mg total) by mouth daily.  30 tablet  3  . glimepiride (AMARYL) 4 MG tablet take 1 tablet once daily before BREAKFAST  90 tablet  1  . glucose blood (FREESTYLE LITE) test strip Use as instructed  100 each  12  . hyoscyamine (LEVBID) 0.375 MG 12 hr tablet take 1 tablet by mouth twice a day if needed for cramping  60 tablet  0  . KLOR-CON M10 10 MEQ tablet take 1 tablet by mouth once daily  30 tablet  5  . Lancets (FREESTYLE) lancets TEST UP TO three times a day if needed  100 each  5  . levothyroxine (SYNTHROID, LEVOTHROID) 75 MCG tablet take 1 tablet by mouth once daily  90 tablet  1  . MICARDIS 80 MG tablet take 1 tablet by mouth once daily  30 tablet  5  . polyethylene glycol powder (GLYCOLAX/MIRALAX) powder take 17GM (DISSOLVED IN WATER) by mouth once daily at bedtime  527 g  5  . sertraline (ZOLOFT) 50  MG tablet take 1 tablet by mouth once daily  30 tablet  5  . simethicone (MYLICON) 125 MG chewable tablet Chew 125 mg by mouth every 6 (six) hours as needed.        . warfarin (COUMADIN) 2 MG tablet Take 1 tablet (2 mg total) by mouth daily.  30 tablet  5     Past Medical History  Diagnosis Date  . Osteoporosis   . Hyperlipidemia   . Tremor, essential   . CHF (congestive heart failure)   . Type II or unspecified type diabetes mellitus with neurological manifestations, not stated as uncontrolled   . Hypertension   . Thyroid disease   . Diverticulosis of colon   . Atrial fibrillation   . Anxiety   . DM (diabetes mellitus)   . Pain in left ear   . Hypothyroidism   . Atrial fibrillation   . Constipation     ROS:   All systems reviewed and negative except as noted in the HPI.   Past Surgical History  Procedure Date  . Cataract extraction 2002  . Pacemaker placement   . L3 compression  fraction   . Transthoracic echocardiogram 2011, 2012     Family History  Problem Relation Age of Onset  . Heart attack Father   . Colon cancer Neg Hx      History   Social History  . Marital Status: Widowed    Spouse Name: N/A    Number of Children: N/A  . Years of Education: N/A   Occupational History  . Not on file.   Social History Main Topics  . Smoking status: Never Smoker   . Smokeless tobacco: Not on file  . Alcohol Use: No  . Drug Use: No  . Sexually Active: Not on file   Other Topics Concern  . Not on file   Social History Narrative  . No narrative on file     BP 120/68  Pulse 63  Ht 5\' 6"  (1.676 m)  Wt 147 lb 12.8 oz (67.042 kg)  BMI 23.86 kg/m2  Physical Exam:  Well appearing elderly woman, NAD HEENT: Unremarkable Neck:  No JVD, no thyromegally Lungs:  Clear except for basilar rales. HEART:  Regular rate rhythm, no murmurs, no rubs, no clicks Abd:  soft, positive bowel sounds, no organomegally, no rebound, no guarding Ext:  2 plus pulses, no  edema, no cyanosis, no clubbing Skin:  No rashes no nodules Neuro:  CN II through XII intact, motor grossly intact  DEVICE  Normal device function.  See PaceArt for details.   Assess/Plan:

## 2011-12-07 ENCOUNTER — Ambulatory Visit (INDEPENDENT_AMBULATORY_CARE_PROVIDER_SITE_OTHER): Payer: Medicare Other | Admitting: Family

## 2011-12-07 DIAGNOSIS — I82409 Acute embolism and thrombosis of unspecified deep veins of unspecified lower extremity: Secondary | ICD-10-CM

## 2011-12-07 DIAGNOSIS — Z7901 Long term (current) use of anticoagulants: Secondary | ICD-10-CM

## 2011-12-07 NOTE — Patient Instructions (Signed)
Then continue 2mg  every day. Recheck in 4 weeks    Latest dosing instructions   Total Sun Mon Tue Wed Thu Fri Sat   14 2 mg 2 mg 2 mg 2 mg 2 mg 2 mg 2 mg    (2 mg1) (2 mg1) (2 mg1) (2 mg1) (2 mg1) (2 mg1) (2 mg1)

## 2011-12-12 ENCOUNTER — Other Ambulatory Visit: Payer: Self-pay | Admitting: Internal Medicine

## 2011-12-22 ENCOUNTER — Other Ambulatory Visit: Payer: Self-pay | Admitting: Internal Medicine

## 2011-12-22 NOTE — Telephone Encounter (Signed)
Coumadin pt  RF

## 2011-12-28 ENCOUNTER — Encounter: Payer: Medicare Other | Admitting: Family

## 2012-01-02 ENCOUNTER — Ambulatory Visit: Payer: Medicare Other | Admitting: Internal Medicine

## 2012-01-04 ENCOUNTER — Encounter: Payer: Self-pay | Admitting: Family

## 2012-01-04 ENCOUNTER — Ambulatory Visit (INDEPENDENT_AMBULATORY_CARE_PROVIDER_SITE_OTHER): Payer: Medicare Other | Admitting: Family

## 2012-01-04 DIAGNOSIS — N390 Urinary tract infection, site not specified: Secondary | ICD-10-CM

## 2012-01-04 DIAGNOSIS — R300 Dysuria: Secondary | ICD-10-CM

## 2012-01-04 DIAGNOSIS — I4891 Unspecified atrial fibrillation: Secondary | ICD-10-CM

## 2012-01-04 LAB — POCT URINALYSIS DIPSTICK
Glucose, UA: NEGATIVE
Nitrite, UA: NEGATIVE
Urobilinogen, UA: 0.2
pH, UA: 5.5

## 2012-01-04 LAB — POCT INR: INR: 2.4

## 2012-01-04 MED ORDER — CEPHALEXIN 500 MG PO TABS: 500.0000 mg | ORAL_TABLET | Freq: Three times a day (TID) | ORAL | Status: AC

## 2012-01-04 NOTE — Progress Notes (Signed)
  Subjective:    Patient ID: [name], female    DOB: 02-11-1927, 78 y.o.   MRN: [id number]  HPI 78 year old AAF is in today with c/o urinary frequency, burning, and foul smell x 3 days. She also has pelvic pressure. Has not taken anything for relief.    Review of Systems  Constitutional: Negative.   Respiratory: Negative.   Cardiovascular: Negative.   Genitourinary: Positive for dysuria, urgency, hematuria and pelvic pain.  Psychiatric/Behavioral: Negative.        Objective:   Physical Exam  Constitutional: She appears well-developed and well-nourished.  Cardiovascular: Normal rate, regular rhythm and normal heart sounds.   Pulmonary/Chest: Effort normal and breath sounds normal.  Abdominal: Soft. Bowel sounds are normal.  Skin: Skin is warm and dry.          Assessment & Plan:  Assessment: UTI  Plan: Cephalexin, Rest, drink plenty of fluids. Call the office if symptoms worsen or persist. Recheck as scheduled and PRN.

## 2012-01-04 NOTE — Addendum Note (Signed)
Addended byAdline Mango B on: 01/04/2012 09:38 AM   Modules accepted: Level of Service

## 2012-01-04 NOTE — Patient Instructions (Signed)

## 2012-01-04 NOTE — Patient Instructions (Signed)
Then continue 2mg every day. Recheck in 6 weeks    Latest dosing instructions   Total Sun Mon Tue Wed Thu Fri Sat   14 2 mg 2 mg 2 mg 2 mg 2 mg 2 mg 2 mg    (2 mg1) (2 mg1) (2 mg1) (2 mg1) (2 mg1) (2 mg1) (2 mg1)        

## 2012-01-04 NOTE — Addendum Note (Signed)
Addended by: Rita Ohara R on: 01/04/2012 09:52 AM   Modules accepted: Orders

## 2012-01-09 ENCOUNTER — Other Ambulatory Visit: Payer: Self-pay | Admitting: Internal Medicine

## 2012-01-16 ENCOUNTER — Ambulatory Visit: Payer: Medicare Other | Admitting: Internal Medicine

## 2012-01-19 ENCOUNTER — Other Ambulatory Visit: Payer: Self-pay | Admitting: Internal Medicine

## 2012-02-05 ENCOUNTER — Ambulatory Visit (INDEPENDENT_AMBULATORY_CARE_PROVIDER_SITE_OTHER): Payer: Medicare Other | Admitting: Internal Medicine

## 2012-02-05 ENCOUNTER — Encounter: Payer: Self-pay | Admitting: Internal Medicine

## 2012-02-05 VITALS — BP 126/70 | Temp 97.6°F | Wt 146.0 lb

## 2012-02-05 DIAGNOSIS — I4891 Unspecified atrial fibrillation: Secondary | ICD-10-CM

## 2012-02-05 DIAGNOSIS — E119 Type 2 diabetes mellitus without complications: Secondary | ICD-10-CM

## 2012-02-05 DIAGNOSIS — N390 Urinary tract infection, site not specified: Secondary | ICD-10-CM

## 2012-02-05 DIAGNOSIS — I10 Essential (primary) hypertension: Secondary | ICD-10-CM

## 2012-02-05 DIAGNOSIS — R300 Dysuria: Secondary | ICD-10-CM

## 2012-02-05 LAB — POCT URINALYSIS DIPSTICK
Blood, UA: NEGATIVE
Protein, UA: NEGATIVE
Spec Grav, UA: 1.01
Urobilinogen, UA: 0.2

## 2012-02-05 MED ORDER — TRIMETHOPRIM 100 MG PO TABS: 100.0000 mg | ORAL_TABLET | Freq: Two times a day (BID) | ORAL | Status: DC

## 2012-02-05 MED ORDER — TRAMADOL HCL 50 MG PO TABS: 50.0000 mg | ORAL_TABLET | Freq: Three times a day (TID) | ORAL | Status: DC | PRN

## 2012-02-05 NOTE — Patient Instructions (Signed)
Drink as much fluid as you  can tolerate over the next few days  Take your antibiotic as prescribed until ALL of it is gone, but stop if you develop a rash, swelling, or any side effects of the medication.  Contact our office as soon as possible if  there are side effects of the medication.   

## 2012-02-05 NOTE — Progress Notes (Signed)
  Subjective:    Patient ID: [name], female    DOB: [birth year], 78 y.o.   MRN: [id number]  HPI 78 year old patient who is seen today for followup. She presents with a 3-4 day history of burning dysuria frequency and nocturia. No fever chills or flank pain. She does complain of some chronic left-sided low back pain.    Review of Systems  Constitutional: Negative for fever and chills.  Genitourinary: Positive for dysuria, urgency, frequency and difficulty urinating. Negative for hematuria and flank pain.       Objective:   Physical Exam  Constitutional: She appears well-developed and well-nourished. No distress.  Abdominal: Soft. Bowel sounds are normal. She exhibits no distension. There is no tenderness. There is no guarding.          Assessment & Plan:   Acute UTI. Patient is on Cipro. We'll treat with the Pentax for one week. She is scheduled for physical in one week we'll reassess at that time

## 2012-02-13 ENCOUNTER — Encounter: Payer: Self-pay | Admitting: Internal Medicine

## 2012-02-13 ENCOUNTER — Ambulatory Visit (INDEPENDENT_AMBULATORY_CARE_PROVIDER_SITE_OTHER): Payer: Medicare Other | Admitting: Family

## 2012-02-13 ENCOUNTER — Ambulatory Visit (INDEPENDENT_AMBULATORY_CARE_PROVIDER_SITE_OTHER): Payer: Medicare Other | Admitting: Internal Medicine

## 2012-02-13 VITALS — BP 114/70 | HR 76 | Temp 97.5°F | Resp 20 | Ht 65.5 in | Wt 143.0 lb

## 2012-02-13 DIAGNOSIS — E785 Hyperlipidemia, unspecified: Secondary | ICD-10-CM

## 2012-02-13 DIAGNOSIS — Z23 Encounter for immunization: Secondary | ICD-10-CM

## 2012-02-13 DIAGNOSIS — E1149 Type 2 diabetes mellitus with other diabetic neurological complication: Secondary | ICD-10-CM

## 2012-02-13 DIAGNOSIS — Z0000 Encounter for general adult medical examination without abnormal findings: Secondary | ICD-10-CM

## 2012-02-13 DIAGNOSIS — Z7901 Long term (current) use of anticoagulants: Secondary | ICD-10-CM

## 2012-02-13 DIAGNOSIS — Z950 Presence of cardiac pacemaker: Secondary | ICD-10-CM

## 2012-02-13 DIAGNOSIS — N289 Disorder of kidney and ureter, unspecified: Secondary | ICD-10-CM

## 2012-02-13 DIAGNOSIS — E039 Hypothyroidism, unspecified: Secondary | ICD-10-CM

## 2012-02-13 DIAGNOSIS — G250 Essential tremor: Secondary | ICD-10-CM

## 2012-02-13 DIAGNOSIS — I4891 Unspecified atrial fibrillation: Secondary | ICD-10-CM

## 2012-02-13 DIAGNOSIS — E119 Type 2 diabetes mellitus without complications: Secondary | ICD-10-CM

## 2012-02-13 DIAGNOSIS — I509 Heart failure, unspecified: Secondary | ICD-10-CM

## 2012-02-13 DIAGNOSIS — E1142 Type 2 diabetes mellitus with diabetic polyneuropathy: Secondary | ICD-10-CM

## 2012-02-13 DIAGNOSIS — I82409 Acute embolism and thrombosis of unspecified deep veins of unspecified lower extremity: Secondary | ICD-10-CM

## 2012-02-13 LAB — GLUCOSE, POCT (MANUAL RESULT ENTRY): POC Glucose: 108 mg/dl — AB (ref 70–99)

## 2012-02-13 LAB — MICROALBUMIN / CREATININE URINE RATIO: Microalb, Ur: 1.2 mg/dL (ref 0.0–1.9)

## 2012-02-13 LAB — CBC WITH DIFFERENTIAL/PLATELET
Basophils Relative: 0.6 % (ref 0.0–3.0)
Eosinophils Absolute: 0 10*3/uL (ref 0.0–0.7)
Hemoglobin: 13.1 g/dL (ref 12.0–15.0)
MCHC: 33 g/dL (ref 30.0–36.0)
MCV: 102.9 fl — ABNORMAL HIGH (ref 78.0–100.0)
Monocytes Absolute: 0.5 10*3/uL (ref 0.1–1.0)
Neutro Abs: 4.5 10*3/uL (ref 1.4–7.7)
Neutrophils Relative %: 59.8 % (ref 43.0–77.0)
RBC: 3.87 Mil/uL (ref 3.87–5.11)

## 2012-02-13 LAB — POCT INR: INR: 2.5

## 2012-02-13 NOTE — Patient Instructions (Addendum)
Then continue 2mg every day. Recheck in 6 weeks    Latest dosing instructions   Total Sun Mon Tue Wed Thu Fri Sat   14 2 mg 2 mg 2 mg 2 mg 2 mg 2 mg 2 mg    (2 mg1) (2 mg1) (2 mg1) (2 mg1) (2 mg1) (2 mg1) (2 mg1)        

## 2012-02-13 NOTE — Progress Notes (Signed)
Subjective:    Patient ID: [name], female    DOB: 01-16-1927, 78 y.o.   MRN: [id number]  HPI  76 -year-old patient who is seen today for a wellness exam. She is followed by cardiology due to a history of atrial fibrillation she is status post ablation and has a PPM. She has had a recent cardiology evaluation. She has hypothyroidism and a history of type 2 diabetes complicated by a mild peripheral neuropathy. No major concerns or complaints today  Colonoscopy 2007  Past Medical History  Diagnosis Date  . Osteoporosis   . Hyperlipidemia   . Tremor, essential   . CHF (congestive heart failure)   . Type II or unspecified type diabetes mellitus with neurological manifestations, not stated as uncontrolled   . Hypertension   . Thyroid disease   . Diverticulosis of colon   . Atrial fibrillation   . Anxiety   . DM (diabetes mellitus)   . Pain in left ear   . Hypothyroidism   . Atrial fibrillation   . Constipation     History   Social History  . Marital Status: Widowed    Spouse Name: N/A    Number of Children: N/A  . Years of Education: N/A   Occupational History  . Not on file.   Social History Main Topics  . Smoking status: Never Smoker   . Smokeless tobacco: Not on file  . Alcohol Use: No  . Drug Use: No  . Sexually Active: Not on file   Other Topics Concern  . Not on file   Social History Narrative  . No narrative on file    Past Surgical History  Procedure Date  . Cataract extraction 2002  . Pacemaker placement   . L3 compression fraction   . Transthoracic echocardiogram 2011, 2012    Family History  Problem Relation Age of Onset  . Heart attack Father   . Colon cancer Neg Hx     Allergies  Allergen Reactions  . Codeine Sulfate     REACTION: unspecified  . Simvastatin     REACTION: choking, acid reflux    Current Outpatient Prescriptions on File Prior to Visit  Medication Sig Dispense Refill  . benzonatate (TESSALON PERLES) 100 MG  capsule Take 1 capsule (100 mg total) by mouth 3 (three) times daily as needed for cough.  30 capsule  0  . diazepam (VALIUM) 5 MG tablet take 1 tablet by mouth every 8 hours if needed for anxiety or sleep  30 tablet  2  . digoxin (LANOXIN) 0.125 MG tablet take 1 tablet by mouth once daily  30 tablet  10  . diltiazem (CARDIZEM CD) 300 MG 24 hr capsule take 1 capsule by mouth once daily  90 capsule  3  . ferrous sulfate 325 (65 FE) MG tablet take 1 tablet by mouth once daily WITH BREAKFAST  30 tablet  5  . fluticasone (FLONASE) 50 MCG/ACT nasal spray Place 2 sprays into the nose daily.  16 g  1  . furosemide (LASIX) 20 MG tablet Take 1 tablet (20 mg total) by mouth daily.  30 tablet  3  . glimepiride (AMARYL) 4 MG tablet take 1 tablet once daily before BREAKFAST  90 tablet  1  . glucose blood test strip Use as instructed      . hyoscyamine (LEVBID) 0.375 MG 12 hr tablet take 1 tablet by mouth twice a day if needed for cramping  60 tablet  0  .  KLOR-CON M10 10 MEQ tablet take 1 tablet by mouth once daily  30 tablet  5  . Lancets (FREESTYLE) lancets TEST UP TO three times a day if needed  100 each  5  . levothyroxine (SYNTHROID, LEVOTHROID) 75 MCG tablet take 1 tablet by mouth once daily  90 tablet  1  . MICARDIS 80 MG tablet take 1 tablet by mouth once daily  30 tablet  5  . polyethylene glycol powder (GLYCOLAX/MIRALAX) powder take 17GM (DISSOLVED IN WATER) by mouth once daily at bedtime  527 g  5  . sertraline (ZOLOFT) 50 MG tablet take 1 tablet by mouth once daily  30 tablet  5  . simethicone (MYLICON) 125 MG chewable tablet Chew 125 mg by mouth every 6 (six) hours as needed.        . traMADol (ULTRAM) 50 MG tablet Take 1 tablet (50 mg total) by mouth every 8 (eight) hours as needed for pain.  30 tablet  0  . trimethoprim (TRIMPEX) 100 MG tablet Take 1 tablet (100 mg total) by mouth 2 (two) times daily.  14 tablet  0  . warfarin (COUMADIN) 2 MG tablet take 1 tablet by mouth once daily  30 tablet   2    BP 114/70  Pulse 76  Temp 97.5 F (36.4 C) (Oral)  Resp 20  Ht 5' 5.5" (1.664 m)  Wt 143 lb (64.864 kg)  BMI 23.43 kg/m2  SpO2 95%    Wt Readings from Last 3 Encounters:  02/13/12 143 lb (64.864 kg)  02/05/12 146 lb (66.225 kg)  11/28/11 147 lb 12.8 oz (67.042 kg)    1. Risk factors, based on past  M,S,F history-  cardiovascular risk factors include hypertension dyslipidemia and type 2 diabetes  2.  Physical activities: Fairly sedentary due to age and some arthritis. Able to do light housework. Still drives  3.  Depression/mood: No history depression or mood disorder  4.  Hearing: No significant deficits  5.  ADL's: Independent in all aspects of daily living lives alone  6.  Fall risk: Low  7.  Home safety: No problems identified  8.  Height weight, and visual acuity; height and weight stable no change in visual acuity history of compression fracture and osteoporosis  9.  Counseling: Cardiology followup recommended low-salt diet heart healthy diet encouraged  10. Lab orders based on risk factors: Laboratory profile will be reviewed including lipid panel and TSH  11. Referral : Followup cardiology. Monthly INRs do to chronic Coumadin anticoagulation  12. Care plan:  Monthly INRs  13. Cognitive assessment: Alert and oriented with normal affect. No cognitive dysfunction     Review of Systems  Constitutional: Positive for fatigue. Negative for fever, appetite change and unexpected weight change.  HENT: Negative for hearing loss, ear pain, nosebleeds, congestion, sore throat, mouth sores, trouble swallowing, neck stiffness, dental problem, voice change, sinus pressure and tinnitus.   Eyes: Negative for photophobia, pain, redness and visual disturbance.  Respiratory: Negative for cough, chest tightness and shortness of breath.   Cardiovascular: Negative for chest pain, palpitations and leg swelling.  Gastrointestinal: Negative for nausea, vomiting, abdominal  pain, diarrhea, constipation, blood in stool, abdominal distention and rectal pain.  Genitourinary: Negative for dysuria, urgency, frequency, hematuria, flank pain, vaginal bleeding, vaginal discharge, difficulty urinating, genital sores, vaginal pain, menstrual problem and pelvic pain.  Musculoskeletal: Positive for arthralgias. Negative for back pain.  Skin: Negative for rash.  Neurological: Positive for weakness and light-headedness. Negative for  dizziness, syncope, speech difficulty, numbness and headaches.  Hematological: Negative for adenopathy. Does not bruise/bleed easily.  Psychiatric/Behavioral: Negative for suicidal ideas, behavioral problems, self-injury, dysphoric mood and agitation. The patient is not nervous/anxious.        Objective:   Physical Exam  Constitutional: She is oriented to person, place, and time. She appears well-developed and well-nourished.  HENT:  Head: Normocephalic and atraumatic.  Right Ear: External ear normal.  Left Ear: External ear normal.  Mouth/Throat: Oropharynx is clear and moist.  Eyes: Conjunctivae normal and EOM are normal.  Neck: Normal range of motion. Neck supple. No JVD present. No thyromegaly present.  Cardiovascular: Normal rate, regular rhythm and normal heart sounds.   No murmur heard.      The left posterior tibial and right dorsalis pedis pulses intact  Pulmonary/Chest: Effort normal and breath sounds normal. She has no wheezes. She has no rales.  Abdominal: Soft. Bowel sounds are normal. She exhibits no distension and no mass. There is no tenderness. There is no rebound and no guarding.  Genitourinary: Vagina normal.  Musculoskeletal: Normal range of motion. She exhibits no edema and no tenderness.  Neurological: She is alert and oriented to person, place, and time. She has normal reflexes. No cranial nerve deficit. She exhibits normal muscle tone. Coordination normal.       Decreased auditory sensation distally  Skin: Skin is warm  and dry. No rash noted.  Psychiatric: She has a normal mood and affect. Her behavior is normal.          Assessment & Plan:   Preventive health examination Hypertension Dyslipidemia Status post AV ablation with PPM History diastolic heart failure Hypothyroidism  Diabetes mellitus type 2  Screening lab reviewed including hemoglobin A1c TSH and lipid profile

## 2012-02-13 NOTE — Patient Instructions (Signed)
Limit your sodium (Salt) intake   Please check your hemoglobin A1c every 3 months    It is important that you exercise regularly, at least 20 minutes 3 to 4 times per week.  If you develop chest pain or shortness of breath seek  medical attention.   

## 2012-02-14 LAB — LIPID PANEL
Cholesterol: 154 mg/dL (ref 0–200)
LDL Cholesterol: 88 mg/dL (ref 0–99)
Total CHOL/HDL Ratio: 6
VLDL: 38 mg/dL (ref 0.0–40.0)

## 2012-02-14 LAB — COMPREHENSIVE METABOLIC PANEL
ALT: 20 U/L (ref 0–35)
AST: 21 U/L (ref 0–37)
Albumin: 4.6 g/dL (ref 3.5–5.2)
Alkaline Phosphatase: 64 U/L (ref 39–117)
BUN: 32 mg/dL — ABNORMAL HIGH (ref 6–23)
Potassium: 3.9 mEq/L (ref 3.5–5.1)

## 2012-02-15 ENCOUNTER — Encounter: Payer: Medicare Other | Admitting: Family

## 2012-02-21 ENCOUNTER — Other Ambulatory Visit: Payer: Self-pay | Admitting: Internal Medicine

## 2012-02-22 NOTE — Telephone Encounter (Signed)
#  14  One BID

## 2012-02-22 NOTE — Telephone Encounter (Signed)
Please advise on RF for this - just seen

## 2012-03-04 ENCOUNTER — Other Ambulatory Visit: Payer: Self-pay

## 2012-03-04 ENCOUNTER — Encounter: Payer: Medicare Other | Admitting: *Deleted

## 2012-03-04 MED ORDER — DIAZEPAM 5 MG PO TABS: 5.0000 mg | ORAL_TABLET | Freq: Three times a day (TID) | ORAL | Status: DC | PRN

## 2012-03-04 MED ORDER — TRAMADOL HCL 50 MG PO TABS: 50.0000 mg | ORAL_TABLET | Freq: Three times a day (TID) | ORAL | Status: DC | PRN

## 2012-03-07 ENCOUNTER — Encounter: Payer: Self-pay | Admitting: *Deleted

## 2012-03-11 ENCOUNTER — Other Ambulatory Visit: Payer: Self-pay | Admitting: Internal Medicine

## 2012-03-26 ENCOUNTER — Encounter: Payer: Medicare Other | Admitting: Family

## 2012-03-27 ENCOUNTER — Encounter: Payer: Self-pay | Admitting: Family Medicine

## 2012-03-27 ENCOUNTER — Ambulatory Visit (INDEPENDENT_AMBULATORY_CARE_PROVIDER_SITE_OTHER): Payer: Medicare Other | Admitting: Family

## 2012-03-27 ENCOUNTER — Ambulatory Visit (INDEPENDENT_AMBULATORY_CARE_PROVIDER_SITE_OTHER): Payer: Medicare Other | Admitting: Family Medicine

## 2012-03-27 VITALS — BP 130/70 | HR 76 | Temp 99.0°F | Wt 141.0 lb

## 2012-03-27 DIAGNOSIS — R300 Dysuria: Secondary | ICD-10-CM

## 2012-03-27 DIAGNOSIS — I82409 Acute embolism and thrombosis of unspecified deep veins of unspecified lower extremity: Secondary | ICD-10-CM

## 2012-03-27 DIAGNOSIS — Z7901 Long term (current) use of anticoagulants: Secondary | ICD-10-CM

## 2012-03-27 DIAGNOSIS — N390 Urinary tract infection, site not specified: Secondary | ICD-10-CM

## 2012-03-27 LAB — POCT URINALYSIS DIPSTICK
Bilirubin, UA: NEGATIVE
Glucose, UA: NEGATIVE
Ketones, UA: NEGATIVE
Spec Grav, UA: 1.01

## 2012-03-27 MED ORDER — SULFAMETHOXAZOLE-TRIMETHOPRIM 800-160 MG PO TABS: 1.0000 | ORAL_TABLET | Freq: Two times a day (BID) | ORAL | Status: DC

## 2012-03-27 NOTE — Patient Instructions (Addendum)
Then continue 2mg every day. Recheck in 6 weeks    Latest dosing instructions   Total Sun Mon Tue Wed Thu Fri Sat   14 2 mg 2 mg 2 mg 2 mg 2 mg 2 mg 2 mg    (2 mg1) (2 mg1) (2 mg1) (2 mg1) (2 mg1) (2 mg1) (2 mg1)        

## 2012-03-27 NOTE — Progress Notes (Signed)
  Subjective:    Patient ID: [name], female    DOB: 06-09-1926, 78 y.o.   MRN: [id number]  HPI Here for 3 days of urinary urgency and burning with some low back pain. Some nausea without vomiting. No fever. She has been treated sveral times for a UTI in the past 6 weeks, once with Cipro and once with Trimethiprim.   Review of Systems  Constitutional: Negative.   Gastrointestinal: Negative.   Genitourinary: Positive for dysuria, urgency and frequency. Negative for hematuria, flank pain and pelvic pain.       Objective:   Physical Exam  Constitutional: She appears well-developed and well-nourished.  Abdominal: Soft. Bowel sounds are normal. She exhibits no distension and no mass. There is no tenderness. There is no rebound and no guarding.          Assessment & Plan:  Drink plenty of water. Await culture results.

## 2012-03-28 ENCOUNTER — Other Ambulatory Visit: Payer: Self-pay

## 2012-03-28 MED ORDER — WARFARIN SODIUM 2 MG PO TABS: 2.0000 mg | ORAL_TABLET | Freq: Every day | ORAL | Status: DC

## 2012-03-29 LAB — URINE CULTURE
Colony Count: NO GROWTH
Organism ID, Bacteria: NO GROWTH

## 2012-04-01 NOTE — Progress Notes (Signed)
Quick Note:  I spoke with pt ______ 

## 2012-04-10 ENCOUNTER — Other Ambulatory Visit: Payer: Self-pay | Admitting: Internal Medicine

## 2012-04-23 ENCOUNTER — Other Ambulatory Visit: Payer: Self-pay | Admitting: Internal Medicine

## 2012-04-24 ENCOUNTER — Telehealth: Payer: Self-pay | Admitting: Internal Medicine

## 2012-04-24 MED ORDER — HYOSCYAMINE SULFATE ER 0.375 MG PO TB12: 0.3750 mg | ORAL_TABLET | Freq: Two times a day (BID) | ORAL | Status: DC | PRN

## 2012-04-24 NOTE — Telephone Encounter (Signed)
Tried to contact pt no answer will try again later. Rx sent to pharmacy as requested.

## 2012-04-24 NOTE — Telephone Encounter (Signed)
Pt needs refill on hyoscyamine .375mg /12 hr tablet refilled as soon as possible. Pt stated she called pharm and they have not filled yet.  [facility] / [address]

## 2012-04-25 NOTE — Telephone Encounter (Signed)
Pt notified Rx refill for hyoscyamine .375mg  was sent to pharmacy. Pt verbalized understanding and stated she already picked it up. Told pt okay.

## 2012-04-26 ENCOUNTER — Other Ambulatory Visit: Payer: Self-pay | Admitting: Internal Medicine

## 2012-05-01 ENCOUNTER — Other Ambulatory Visit: Payer: Self-pay | Admitting: Internal Medicine

## 2012-05-08 ENCOUNTER — Ambulatory Visit (INDEPENDENT_AMBULATORY_CARE_PROVIDER_SITE_OTHER): Payer: Medicare Other | Admitting: Family

## 2012-05-08 DIAGNOSIS — I4891 Unspecified atrial fibrillation: Secondary | ICD-10-CM

## 2012-05-08 NOTE — Patient Instructions (Addendum)
Then continue 2mg  every day. Recheck in 6 weeks    Latest dosing instructions   Total Sun Mon Tue Wed Thu Fri Sat   14 2 mg 2 mg 2 mg 2 mg 2 mg 2 mg 2 mg    (2 mg1) (2 mg1) (2 mg1) (2 mg1) (2 mg1) (2 mg1) (2 mg1)

## 2012-05-10 ENCOUNTER — Other Ambulatory Visit: Payer: Self-pay | Admitting: Internal Medicine

## 2012-05-16 ENCOUNTER — Other Ambulatory Visit: Payer: Self-pay | Admitting: Internal Medicine

## 2012-05-25 ENCOUNTER — Other Ambulatory Visit: Payer: Self-pay | Admitting: Internal Medicine

## 2012-05-27 ENCOUNTER — Telehealth: Payer: Self-pay | Admitting: Internal Medicine

## 2012-05-27 ENCOUNTER — Other Ambulatory Visit: Payer: Self-pay | Admitting: Internal Medicine

## 2012-05-27 MED ORDER — DIAZEPAM 5 MG PO TABS: 5.0000 mg | ORAL_TABLET | Freq: Three times a day (TID) | ORAL | Status: DC | PRN

## 2012-05-27 NOTE — Telephone Encounter (Signed)
Pt requesting refill on diazepam (VALIUM) 5 MG tablet   [facility] - [place],  - [address]  Pt requesting to be contacted when this is called in

## 2012-05-27 NOTE — Telephone Encounter (Signed)
Pt notified Rx refill for Valium was called into pharmacy.

## 2012-06-10 ENCOUNTER — Other Ambulatory Visit: Payer: Self-pay | Admitting: Internal Medicine

## 2012-06-14 ENCOUNTER — Encounter: Payer: Self-pay | Admitting: *Deleted

## 2012-06-19 ENCOUNTER — Encounter: Payer: Medicare Other | Admitting: Family

## 2012-06-19 DIAGNOSIS — Z0289 Encounter for other administrative examinations: Secondary | ICD-10-CM

## 2012-06-25 ENCOUNTER — Other Ambulatory Visit: Payer: Self-pay | Admitting: *Deleted

## 2012-06-25 ENCOUNTER — Ambulatory Visit (INDEPENDENT_AMBULATORY_CARE_PROVIDER_SITE_OTHER): Payer: Medicare Other | Admitting: Family

## 2012-06-25 DIAGNOSIS — I4891 Unspecified atrial fibrillation: Secondary | ICD-10-CM

## 2012-06-25 LAB — POCT INR: INR: 3.5

## 2012-06-25 MED ORDER — WARFARIN SODIUM 2 MG PO TABS: 2.0000 mg | ORAL_TABLET | Freq: Every day | ORAL | Status: DC

## 2012-06-25 NOTE — Patient Instructions (Addendum)
Hold Coumadin tomorrow only. Then continue 2mg  every day. Recheck in 3 weeks    Latest dosing instructions   Total Sun Mon Tue Wed Thu Fri Sat   14 2 mg 2 mg 2 mg 2 mg 2 mg 2 mg 2 mg    (2 mg1) (2 mg1) (2 mg1) (2 mg1) (2 mg1) (2 mg1) (2 mg1)

## 2012-06-25 NOTE — Telephone Encounter (Signed)
Script for warfarin 2mg   filled per epic on 06/25/12 with 2 refills

## 2012-07-17 ENCOUNTER — Ambulatory Visit (INDEPENDENT_AMBULATORY_CARE_PROVIDER_SITE_OTHER): Payer: Medicare Other | Admitting: Family

## 2012-07-17 ENCOUNTER — Other Ambulatory Visit: Payer: Self-pay | Admitting: Internal Medicine

## 2012-07-17 LAB — POCT INR: INR: 3.1

## 2012-07-17 NOTE — Patient Instructions (Addendum)
Take 1/2 tablet today only. Then continue 2mg  every day. Recheck in 4 weeks  Anticoagulation Dose Instructions as of 07/17/2012     [name] Tue Wed Thu Fri Sat   New Dose 2 mg 2 mg 2 mg 2 mg 2 mg 2 mg 2 mg    Description       Take 1/2 tablet today only. Then continue 2mg  every day. Recheck in 4 weeks

## 2012-07-23 ENCOUNTER — Other Ambulatory Visit: Payer: Self-pay | Admitting: Internal Medicine

## 2012-08-08 ENCOUNTER — Encounter: Payer: Self-pay | Admitting: Internal Medicine

## 2012-08-08 ENCOUNTER — Ambulatory Visit (INDEPENDENT_AMBULATORY_CARE_PROVIDER_SITE_OTHER): Payer: Medicare Other | Admitting: Internal Medicine

## 2012-08-08 VITALS — BP 120/60 | HR 78 | Temp 98.3°F | Resp 20 | Wt 148.0 lb

## 2012-08-08 DIAGNOSIS — E119 Type 2 diabetes mellitus without complications: Secondary | ICD-10-CM

## 2012-08-08 DIAGNOSIS — I10 Essential (primary) hypertension: Secondary | ICD-10-CM

## 2012-08-08 DIAGNOSIS — I4891 Unspecified atrial fibrillation: Secondary | ICD-10-CM

## 2012-08-08 DIAGNOSIS — Z950 Presence of cardiac pacemaker: Secondary | ICD-10-CM

## 2012-08-08 DIAGNOSIS — K5730 Diverticulosis of large intestine without perforation or abscess without bleeding: Secondary | ICD-10-CM

## 2012-08-08 DIAGNOSIS — E039 Hypothyroidism, unspecified: Secondary | ICD-10-CM

## 2012-08-08 DIAGNOSIS — R300 Dysuria: Secondary | ICD-10-CM

## 2012-08-08 LAB — POCT URINALYSIS DIPSTICK
Nitrite, UA: NEGATIVE
Urobilinogen, UA: 0.2
pH, UA: 6

## 2012-08-08 MED ORDER — NITROFURANTOIN MACROCRYSTAL 50 MG PO CAPS: 50.0000 mg | ORAL_CAPSULE | Freq: Four times a day (QID) | ORAL | Status: DC

## 2012-08-08 NOTE — Patient Instructions (Signed)
Limit your sodium (Salt) intake   Please check your hemoglobin A1c every 3 months  Take your antibiotic as prescribed until ALL of it is gone, but stop if you develop a rash, swelling, or any side effects of the medication.  Contact our office as soon as possible if  there are side effects of the medication.

## 2012-08-08 NOTE — Progress Notes (Signed)
Subjective:    Patient ID: [name], female    DOB: 1926-09-15, 79 y.o.   MRN: [id number]  HPI  79 year old patient who is seen today for followup. For the past 2 weeks has had intermittent urinary frequency and dysuria. She does have a history chronic UTIs. Urinalysis does confirm hematuria and pyuria. She has a history of diabetes which has been well-controlled on oral medications. Her last hemoglobin A1c was 6 months ago. She has treated hypertension and also a history of atrial fibrillation. Remains on chronic Coumadin anticoagulation. Complaints includes some occasional insomnia as well as peripheral edema  status post permanent pacemaker insertion  Past Medical History  Diagnosis Date  . Osteoporosis   . Hyperlipidemia   . Tremor, essential   . CHF (congestive heart failure)   . Type II or unspecified type diabetes mellitus with neurological manifestations, not stated as uncontrolled(250.60)   . Hypertension   . Thyroid disease   . Diverticulosis of colon   . Atrial fibrillation   . Anxiety   . DM (diabetes mellitus)   . Pain in left ear   . Hypothyroidism   . Atrial fibrillation   . Constipation     History   Social History  . Marital Status: Widowed    Spouse Name: N/A    Number of Children: N/A  . Years of Education: N/A   Occupational History  . Not on file.   Social History Main Topics  . Smoking status: Never Smoker   . Smokeless tobacco: Never Used  . Alcohol Use: No  . Drug Use: No  . Sexually Active: Not on file   Other Topics Concern  . Not on file   Social History Narrative  . No narrative on file    Past Surgical History  Procedure Laterality Date  . Cataract extraction  2002  . Pacemaker placement    . L3 compression fraction    . Transthoracic echocardiogram  2011, 2012    Family History  Problem Relation Age of Onset  . Heart attack Father   . Colon cancer Neg Hx     Allergies  Allergen Reactions  . Codeine Sulfate      REACTION: unspecified  . Simvastatin     REACTION: choking, acid reflux    Current Outpatient Prescriptions on File Prior to Visit  Medication Sig Dispense Refill  . diazepam (VALIUM) 5 MG tablet Take 1 tablet (5 mg total) by mouth every 8 (eight) hours as needed for anxiety.  30 tablet  2  . digoxin (LANOXIN) 0.125 MG tablet take 1 tablet by mouth once daily  30 tablet  10  . diltiazem (CARDIZEM CD) 300 MG 24 hr capsule take 1 capsule by mouth once daily  90 capsule  3  . ferrous sulfate 325 (65 FE) MG tablet take 1 tablet by mouth once daily WITH BREAKFAST  30 tablet  3  . fluticasone (FLONASE) 50 MCG/ACT nasal spray Place 2 sprays into the nose daily.  16 g  1  . FREESTYLE TEST STRIPS test strip TEST as directed once daily  100 each  4  . glimepiride (AMARYL) 4 MG tablet take 1 tablet by mouth once daily before BREAKFAST  90 tablet  3  . hyoscyamine (LEVBID) 0.375 MG 12 hr tablet take 1 tablet by mouth twice a day if needed for cramping THIS ONLY TO BE USED AS NEEDED NOT 2 TIMES A DAY EVERYDAY  30 tablet  1  . KLOR-CON M10  10 MEQ tablet take 1 tablet by mouth once daily  30 tablet  5  . Lancets (FREESTYLE) lancets TEST UP TO three times a day if needed  100 each  5  . levothyroxine (SYNTHROID, LEVOTHROID) 75 MCG tablet take 1 tablet by mouth once daily  90 tablet  1  . MICARDIS 80 MG tablet take 1 tablet by mouth once daily  30 each  5  . polyethylene glycol powder (GLYCOLAX/MIRALAX) powder take 17GM (DISSOLVED IN WATER) by mouth once daily  255 g  3  . simethicone (MYLICON) 125 MG chewable tablet Chew 125 mg by mouth every 6 (six) hours as needed.        . warfarin (COUMADIN) 2 MG tablet Take 1 tablet (2 mg total) by mouth daily.  30 tablet  2  . furosemide (LASIX) 20 MG tablet Take 1 tablet (20 mg total) by mouth daily.  30 tablet  3   No current facility-administered medications on file prior to visit.    BP 120/60  Pulse 78  Temp(Src) 98.3 F (36.8 C) (Oral)  Resp 20  Wt  148 lb (67.132 kg)  BMI 24.25 kg/m2  SpO2 97%      Review of Systems  Constitutional: Negative.   HENT: Negative for hearing loss, congestion, sore throat, rhinorrhea, dental problem, sinus pressure and tinnitus.   Eyes: Negative for pain, discharge and visual disturbance.  Respiratory: Negative for cough and shortness of breath.   Cardiovascular: Positive for leg swelling. Negative for chest pain and palpitations.  Gastrointestinal: Negative for nausea, vomiting, abdominal pain, diarrhea, constipation, blood in stool and abdominal distention.  Genitourinary: Positive for dysuria and frequency. Negative for urgency, hematuria, flank pain, vaginal bleeding, vaginal discharge, difficulty urinating, vaginal pain and pelvic pain.  Musculoskeletal: Negative for joint swelling, arthralgias and gait problem.  Skin: Negative for rash.  Neurological: Negative for dizziness, syncope, speech difficulty, weakness, numbness and headaches.  Hematological: Negative for adenopathy.  Psychiatric/Behavioral: Negative for behavioral problems, dysphoric mood and agitation. The patient is not nervous/anxious.        Objective:   Physical Exam  Constitutional: She is oriented to person, place, and time. She appears well-developed and well-nourished.  HENT:  Head: Normocephalic.  Right Ear: External ear normal.  Left Ear: External ear normal.  Mouth/Throat: Oropharynx is clear and moist.  Eyes: Conjunctivae and EOM are normal. Pupils are equal, round, and reactive to light.  Neck: Normal range of motion. Neck supple. No thyromegaly present.  Cardiovascular: Normal rate, normal heart sounds and intact distal pulses.   Pulmonary/Chest: Effort normal and breath sounds normal.  Abdominal: Soft. Bowel sounds are normal. She exhibits no mass. There is no tenderness.  Musculoskeletal: Normal range of motion. She exhibits edema.  Trace lower extremity edema  Lymphadenopathy:    She has no cervical  adenopathy.  Neurological: She is alert and oriented to person, place, and time.  Skin: Skin is warm and dry. No rash noted.  Psychiatric: She has a normal mood and affect. Her behavior is normal.          Assessment & Plan:   Acute UTI. Will treat with Macrodantin Atrial fibrillation with chronic Coumadin anticoagulation Hypertension well controlled Diabetes mellitus. Will check a hemoglobin A1c  Recheck 3 months

## 2012-08-09 ENCOUNTER — Other Ambulatory Visit: Payer: Self-pay | Admitting: Internal Medicine

## 2012-08-14 ENCOUNTER — Ambulatory Visit (INDEPENDENT_AMBULATORY_CARE_PROVIDER_SITE_OTHER): Payer: Medicare Other | Admitting: Family

## 2012-08-14 DIAGNOSIS — I4891 Unspecified atrial fibrillation: Secondary | ICD-10-CM

## 2012-08-14 NOTE — Patient Instructions (Signed)
Take an extra 1/2 tab today only. Then continue 2mg  every day. Recheck in 3 weeks  Anticoagulation Dose Instructions as of 08/14/2012     Glynis Smiles Tue Wed Thu Fri Sat   New Dose 2 mg 2 mg 2 mg 2 mg 2 mg 2 mg 2 mg    Description        Take an extra 1/2 tab today only. Then continue 2mg  every day. Recheck in 4 weeks

## 2012-08-20 ENCOUNTER — Ambulatory Visit: Payer: Medicare Other | Admitting: Internal Medicine

## 2012-08-20 ENCOUNTER — Other Ambulatory Visit: Payer: Self-pay | Admitting: Internal Medicine

## 2012-08-27 ENCOUNTER — Other Ambulatory Visit: Payer: Self-pay | Admitting: Internal Medicine

## 2012-09-04 ENCOUNTER — Ambulatory Visit (INDEPENDENT_AMBULATORY_CARE_PROVIDER_SITE_OTHER): Payer: Medicare Other | Admitting: Family

## 2012-09-04 DIAGNOSIS — I4891 Unspecified atrial fibrillation: Secondary | ICD-10-CM

## 2012-09-04 NOTE — Patient Instructions (Addendum)
Hold Coumadin today (Thursday). Take 1/2 tab on Friday. Then continue 2mg  every day. Recheck in 3 weeks  Anticoagulation Dose Instructions as of 09/04/2012     [name] Tue Wed Thu Fri Sat   New Dose 2 mg 2 mg 2 mg 2 mg 2 mg 2 mg 2 mg    Description       Hold Coumadin today (Thursday). Take 1/2 tab on Friday. Then continue 2mg  every day. Recheck in 3 weeks

## 2012-09-11 ENCOUNTER — Encounter: Payer: Self-pay | Admitting: Internal Medicine

## 2012-09-11 ENCOUNTER — Ambulatory Visit (INDEPENDENT_AMBULATORY_CARE_PROVIDER_SITE_OTHER): Payer: Medicare Other | Admitting: Internal Medicine

## 2012-09-11 VITALS — BP 153/67 | HR 76 | Wt 144.6 lb

## 2012-09-11 DIAGNOSIS — I4891 Unspecified atrial fibrillation: Secondary | ICD-10-CM

## 2012-09-11 DIAGNOSIS — Z950 Presence of cardiac pacemaker: Secondary | ICD-10-CM

## 2012-09-11 LAB — PACEMAKER DEVICE OBSERVATION
BATTERY VOLTAGE: 2.79 V
BMOD-0005RV: 95 {beats}/min
RV LEAD IMPEDENCE PM: 583 Ohm
RV LEAD THRESHOLD: 0.5 V

## 2012-09-11 NOTE — Patient Instructions (Addendum)
Your physician wants you to follow-up in: 6 months in the device clinic and 12 months with Dr Court Joy will receive a reminder letter in the mail two months in advance. If you don't receive a letter, please call our office to schedule the follow-up appointment.    Your physician has recommended you make the following change in your medication:  1) Stop Digoxin

## 2012-09-11 NOTE — Assessment & Plan Note (Signed)
Her ventricular rate is well-controlled after AV node ablation. She will continue her current medical therapy except as I have asked the patient to stop taking digoxin.

## 2012-09-11 NOTE — Assessment & Plan Note (Signed)
Her Medtronic single-chamber pacemaker is working normally. We'll plan to recheck in several months. 

## 2012-09-11 NOTE — Progress Notes (Signed)
HPI [name] returns today for followup. She is a very pleasant 79 year old woman with chronic atrial fibrillation, status post permanent pacemaker insertion, status post AV nodal ablation, with a history of diastolic heart failure. In the interim, she has been stable but has been bothered by chronic back pain. She has difficulty walking because it hurts her back. She denies syncope. No peripheral edema. No chest pain or hemoptysis. Allergies  Allergen Reactions  . Codeine Sulfate     REACTION: unspecified  . Simvastatin     REACTION: choking, acid reflux     Current Outpatient Prescriptions  Medication Sig Dispense Refill  . diazepam (VALIUM) 5 MG tablet take 1 tablet by mouth every 8 hours if needed for anxiety for sleep  30 tablet  2  . diltiazem (CARDIZEM CD) 300 MG 24 hr capsule take 1 capsule by mouth once daily  90 capsule  3  . ferrous sulfate 325 (65 FE) MG tablet take 1 tablet by mouth once daily with BREAKFAST  30 tablet  3  . FREESTYLE TEST STRIPS test strip TEST as directed once daily  100 each  4  . furosemide (LASIX) 20 MG tablet Take 1 tablet (20 mg total) by mouth daily.  30 tablet  3  . glimepiride (AMARYL) 4 MG tablet take 1 tablet by mouth once daily before BREAKFAST  90 tablet  3  . hyoscyamine (LEVBID) 0.375 MG 12 hr tablet take 1 tablet by mouth twice a day if needed for cramping THIS ONLY TO BE USED AS NEEDED NOT 2 TIMES A DAY EVERYDAY  30 tablet  1  . KLOR-CON M10 10 MEQ tablet take 1 tablet by mouth once daily  30 tablet  5  . Lancets (FREESTYLE) lancets TEST UP TO three times a day if needed  100 each  5  . levothyroxine (SYNTHROID, LEVOTHROID) 75 MCG tablet take 1 tablet by mouth once daily  90 tablet  1  . MICARDIS 80 MG tablet take 1 tablet by mouth once daily  30 each  5  . polyethylene glycol powder (GLYCOLAX/MIRALAX) powder take 17GM (DISSOLVED IN WATER) by mouth once daily  255 g  3  . simethicone (MYLICON) 125 MG chewable tablet Chew 125 mg by mouth  every 6 (six) hours as needed.        . warfarin (COUMADIN) 2 MG tablet Take 1 tablet (2 mg total) by mouth daily.  30 tablet  2   No current facility-administered medications for this visit.     Past Medical History  Diagnosis Date  . Osteoporosis   . Hyperlipidemia   . Tremor, essential   . CHF (congestive heart failure)   . Type II or unspecified type diabetes mellitus with neurological manifestations, not stated as uncontrolled(250.60)   . Hypertension   . Thyroid disease   . Diverticulosis of colon   . Atrial fibrillation   . Anxiety   . DM (diabetes mellitus)   . Pain in left ear   . Hypothyroidism   . Atrial fibrillation   . Constipation     ROS:   All systems reviewed and negative except as noted in the HPI.   Past Surgical History  Procedure Laterality Date  . Cataract extraction  2002  . Pacemaker placement    . L3 compression fraction    . Transthoracic echocardiogram  2011, 2012     Family History  Problem Relation Age of Onset  . Heart attack Father   . Colon cancer  Neg Hx      History   Social History  . Marital Status: Widowed    Spouse Name: N/A    Number of Children: N/A  . Years of Education: N/A   Occupational History  . Not on file.   Social History Main Topics  . Smoking status: Never Smoker   . Smokeless tobacco: Never Used  . Alcohol Use: No  . Drug Use: No  . Sexually Active: Not on file   Other Topics Concern  . Not on file   Social History Narrative  . No narrative on file     BP 153/67  Pulse 76  Wt 144 lb 9.6 oz (65.59 kg)  BMI 23.69 kg/m2  Physical Exam:  Well appearing elderly woman,NAD HEENT: Unremarkable Neck:  7 cm JVD, no thyromegally Back:  No CVA tenderness, mild kyphosis Lungs:  Clear with no wheezes, rales, or rhonchi. HEART:  Regular rate rhythm, no murmurs, no rubs, no clicks Abd:  soft, positive bowel sounds, no organomegally, no rebound, no guarding Ext:  2 plus pulses, no edema, no  cyanosis, no clubbing Skin:  No rashes no nodules Neuro:  CN II through XII intact, motor grossly intact  DEVICE  Normal device function.  See PaceArt for details.   Assess/Plan:

## 2012-09-16 ENCOUNTER — Other Ambulatory Visit: Payer: Self-pay | Admitting: Internal Medicine

## 2012-09-25 ENCOUNTER — Ambulatory Visit (INDEPENDENT_AMBULATORY_CARE_PROVIDER_SITE_OTHER): Payer: Medicare Other | Admitting: Family

## 2012-09-25 DIAGNOSIS — I4891 Unspecified atrial fibrillation: Secondary | ICD-10-CM

## 2012-09-25 NOTE — Patient Instructions (Addendum)
continue 2mg  every day. Recheck in 4 weeks  Anticoagulation Dose Instructions as of 09/25/2012     Glynis Smiles Tue Wed Thu Fri Sat   New Dose 2 mg 2 mg 2 mg 2 mg 2 mg 2 mg 2 mg    Description       continue 2mg  every day. Recheck in 4 weeks        ;

## 2012-09-26 ENCOUNTER — Telehealth: Payer: Self-pay | Admitting: Internal Medicine

## 2012-09-26 NOTE — Telephone Encounter (Signed)
Checked medications with Dr. Amador Cunas and yes pt is not to be taking Digoxin was stopped on 4/23 by Cardiologist, but should be taking Potassium and Lasix.  Called pharmacy and spoke to [place] told her pt is not suppose to be taking Digoxin was discontinued on 4/23 and should still be taking Potassium and Lasix. Cathy verbalized understanding.

## 2012-09-26 NOTE — Telephone Encounter (Signed)
Pharm needs clarification for pt on digoxin (LANOXIN) 0.125 MG tablet (she wants refill, not on current med list) Questions if she is still on lasix and KLOR-CON  Pls call pharm Trista at [facility].

## 2012-10-01 ENCOUNTER — Other Ambulatory Visit: Payer: Self-pay

## 2012-10-01 MED ORDER — WARFARIN SODIUM 2 MG PO TABS: 2.0000 mg | ORAL_TABLET | Freq: Every day | ORAL | Status: DC

## 2012-10-14 ENCOUNTER — Other Ambulatory Visit: Payer: Self-pay | Admitting: Internal Medicine

## 2012-10-18 ENCOUNTER — Emergency Department (HOSPITAL_COMMUNITY)
Admission: EM | Admit: 2012-10-18 | Discharge: 2012-10-19 | Discharge status: Against Medical Advice | Payer: Medicare Other | Attending: Emergency Medicine | Admitting: Emergency Medicine

## 2012-10-18 ENCOUNTER — Encounter (HOSPITAL_COMMUNITY): Payer: Self-pay | Admitting: *Deleted

## 2012-10-18 DIAGNOSIS — I509 Heart failure, unspecified: Secondary | ICD-10-CM | POA: Insufficient documentation

## 2012-10-18 DIAGNOSIS — E119 Type 2 diabetes mellitus without complications: Secondary | ICD-10-CM | POA: Insufficient documentation

## 2012-10-18 DIAGNOSIS — K5900 Constipation, unspecified: Secondary | ICD-10-CM | POA: Insufficient documentation

## 2012-10-18 DIAGNOSIS — I4891 Unspecified atrial fibrillation: Secondary | ICD-10-CM | POA: Insufficient documentation

## 2012-10-18 DIAGNOSIS — F411 Generalized anxiety disorder: Secondary | ICD-10-CM | POA: Insufficient documentation

## 2012-10-18 DIAGNOSIS — E039 Hypothyroidism, unspecified: Secondary | ICD-10-CM | POA: Insufficient documentation

## 2012-10-18 DIAGNOSIS — E785 Hyperlipidemia, unspecified: Secondary | ICD-10-CM | POA: Insufficient documentation

## 2012-10-18 DIAGNOSIS — M810 Age-related osteoporosis without current pathological fracture: Secondary | ICD-10-CM | POA: Insufficient documentation

## 2012-10-18 DIAGNOSIS — Z7901 Long term (current) use of anticoagulants: Secondary | ICD-10-CM | POA: Insufficient documentation

## 2012-10-18 DIAGNOSIS — Z79899 Other long term (current) drug therapy: Secondary | ICD-10-CM | POA: Insufficient documentation

## 2012-10-18 DIAGNOSIS — Z8719 Personal history of other diseases of the digestive system: Secondary | ICD-10-CM | POA: Insufficient documentation

## 2012-10-18 DIAGNOSIS — E079 Disorder of thyroid, unspecified: Secondary | ICD-10-CM | POA: Insufficient documentation

## 2012-10-18 DIAGNOSIS — I10 Essential (primary) hypertension: Secondary | ICD-10-CM | POA: Insufficient documentation

## 2012-10-18 NOTE — ED Notes (Signed)
The pt had a good bm while waiting and now she does not want to be seen.  Upset and irritated about waiting etc

## 2012-10-18 NOTE — ED Notes (Signed)
Pt arrived by [facility] for abd pain, being constipated x 4 days, no relief with laxatives.

## 2012-10-23 ENCOUNTER — Ambulatory Visit (INDEPENDENT_AMBULATORY_CARE_PROVIDER_SITE_OTHER): Payer: Medicare Other | Admitting: Family

## 2012-10-23 DIAGNOSIS — I4891 Unspecified atrial fibrillation: Secondary | ICD-10-CM

## 2012-10-23 NOTE — Patient Instructions (Addendum)
Take extra 1/2 tablet today and Thursday. Continue 2mg  every day. Recheck in 2 weeks     Anticoagulation Dose Instructions as of 10/23/2012     Glynis Smiles Tue Wed Thu Fri Sat   New Dose 2 mg 2 mg 2 mg 2 mg 2 mg 2 mg 2 mg    Description       Take extra 1/2 tablet today and Thursday. Continue 2mg  every day. Recheck in 2 weeks

## 2012-10-26 ENCOUNTER — Other Ambulatory Visit: Payer: Self-pay | Admitting: Internal Medicine

## 2012-11-02 ENCOUNTER — Other Ambulatory Visit: Payer: Self-pay | Admitting: Internal Medicine

## 2012-11-04 ENCOUNTER — Other Ambulatory Visit: Payer: Self-pay | Admitting: Internal Medicine

## 2012-11-06 ENCOUNTER — Ambulatory Visit (INDEPENDENT_AMBULATORY_CARE_PROVIDER_SITE_OTHER): Payer: Medicare Other | Admitting: Family

## 2012-11-06 DIAGNOSIS — I4891 Unspecified atrial fibrillation: Secondary | ICD-10-CM

## 2012-11-06 LAB — POCT INR: INR: 1.7

## 2012-11-06 NOTE — Patient Instructions (Addendum)
Continue 2mg  every day. Every Wednesday, take 1.5 tabs. Recheck in 3 weeks  Anticoagulation Dose Instructions as of 11/06/2012     [name] Tue Wed Thu Fri Sat   New Dose 2 mg 2 mg 2 mg 3 mg 2 mg 2 mg 2 mg    Description       Continue 2mg  every day. Every Wednesday, take 1.5 tabs. Recheck in 3 weeks

## 2012-11-14 ENCOUNTER — Other Ambulatory Visit: Payer: Self-pay | Admitting: Internal Medicine

## 2012-11-27 ENCOUNTER — Encounter: Payer: Medicare Other | Admitting: Family

## 2012-11-28 ENCOUNTER — Ambulatory Visit (INDEPENDENT_AMBULATORY_CARE_PROVIDER_SITE_OTHER): Payer: Medicare Other | Admitting: General Practice

## 2012-11-28 DIAGNOSIS — I4891 Unspecified atrial fibrillation: Secondary | ICD-10-CM

## 2012-11-28 LAB — POCT INR: INR: 1.6

## 2012-12-12 ENCOUNTER — Ambulatory Visit (INDEPENDENT_AMBULATORY_CARE_PROVIDER_SITE_OTHER): Payer: Medicare Other | Admitting: General Practice

## 2012-12-12 DIAGNOSIS — I4891 Unspecified atrial fibrillation: Secondary | ICD-10-CM

## 2012-12-12 DIAGNOSIS — Z7901 Long term (current) use of anticoagulants: Secondary | ICD-10-CM

## 2012-12-12 LAB — POCT INR: INR: 1.7

## 2012-12-19 ENCOUNTER — Telehealth: Payer: Self-pay | Admitting: Internal Medicine

## 2012-12-19 NOTE — Telephone Encounter (Signed)
Representative from [facility] calling to check the status of rx request for topical compound cream that was faxed to office on 12/12/12.  Please return call to [phone] ext [phone] with status.

## 2012-12-24 ENCOUNTER — Other Ambulatory Visit: Payer: Self-pay

## 2012-12-24 MED ORDER — WARFARIN SODIUM 2 MG PO TABS: ORAL_TABLET | ORAL | Status: DC

## 2012-12-30 ENCOUNTER — Telehealth: Payer: Self-pay | Admitting: Internal Medicine

## 2012-12-30 NOTE — Telephone Encounter (Signed)
Appointment  made with Dr Kirtland Bouchard tomorrow at 2:30

## 2012-12-30 NOTE — Telephone Encounter (Signed)
Tried to call back number unable to speak with anyone. Dr.K did not order medication.

## 2012-12-30 NOTE — Telephone Encounter (Signed)
Patient Information:  Caller Name: Evalyne  Phone: [phone]  Patient: [name], [name]  Gender: Female  DOB: Jun 10, 1926  Age: 79 Years  PCP: Eleonore Chiquito [facility])  Office Follow Up:  Does the office need to follow up with this patient?: Yes  Instructions For The Office: PLS CALL PT BACK FOR APPT NEEDS, NO AVAILABLITY W/ DR K.  RN Note:  Pt sitting down on coach, bent over looking at pictures on 8-10 w/ Gdaughter, Pt felt a pop in Left Ankle, Pt is swelling in Ankle, doubled in size, walking normally. No availability w/ Dr Amador Cunas.  PLEASE CALL PT BACK FOR APPT NEEDS DUE TO POPPING SOUND HEARD IN LEFT ANKLE.   Symptoms  Reason For Call & Symptoms: Ankle Injury  Reviewed Health History In EMR: Yes  Reviewed Medications In EMR: Yes  Reviewed Allergies In EMR: Yes  Reviewed Surgeries / Procedures: Yes  Date of Onset of Symptoms: 12/29/2012  Treatments Tried: ice pack  Treatments Tried Worked: No  Guideline(s) Used:  Ankle and Foot Injury  Disposition Per Guideline:   See Today in Office  Reason For Disposition Reached:   A "snap" or "pop" was heard at the time of injury  Advice Given:  N/A  Patient Will Follow Care Advice:  YES

## 2012-12-31 ENCOUNTER — Ambulatory Visit (INDEPENDENT_AMBULATORY_CARE_PROVIDER_SITE_OTHER): Payer: Medicare Other | Admitting: Internal Medicine

## 2012-12-31 ENCOUNTER — Encounter: Payer: Self-pay | Admitting: Internal Medicine

## 2012-12-31 VITALS — BP 100/60 | HR 86 | Temp 98.2°F | Resp 20 | Wt 151.0 lb

## 2012-12-31 DIAGNOSIS — E1149 Type 2 diabetes mellitus with other diabetic neurological complication: Secondary | ICD-10-CM

## 2012-12-31 DIAGNOSIS — I509 Heart failure, unspecified: Secondary | ICD-10-CM

## 2012-12-31 DIAGNOSIS — E119 Type 2 diabetes mellitus without complications: Secondary | ICD-10-CM

## 2012-12-31 DIAGNOSIS — I4891 Unspecified atrial fibrillation: Secondary | ICD-10-CM

## 2012-12-31 DIAGNOSIS — N289 Disorder of kidney and ureter, unspecified: Secondary | ICD-10-CM

## 2012-12-31 DIAGNOSIS — I10 Essential (primary) hypertension: Secondary | ICD-10-CM

## 2012-12-31 LAB — BASIC METABOLIC PANEL
BUN: 24 mg/dL — ABNORMAL HIGH (ref 6–23)
Creatinine, Ser: 1.3 mg/dL — ABNORMAL HIGH (ref 0.4–1.2)
GFR: 42.63 mL/min — ABNORMAL LOW (ref >60.00)

## 2012-12-31 LAB — HEMOGLOBIN A1C: Hgb A1c MFr Bld: 5.7 % (ref 4.6–6.5)

## 2012-12-31 LAB — MICROALBUMIN / CREATININE URINE RATIO: Microalb, Ur: 1.2 mg/dL (ref 0.0–1.9)

## 2012-12-31 MED ORDER — DIAZEPAM 5 MG PO TABS: ORAL_TABLET | ORAL | Status: DC

## 2012-12-31 MED ORDER — FUROSEMIDE 20 MG PO TABS: 20.0000 mg | ORAL_TABLET | Freq: Every day | ORAL | Status: DC

## 2012-12-31 NOTE — Patient Instructions (Addendum)
Limit your sodium (Salt) intake  Take an extra furosemide 20 mg daily for worsening swelling or weight gain  Return in 3 months for follow-up    Please check your hemoglobin A1c every 3 months

## 2012-12-31 NOTE — Progress Notes (Signed)
Subjective:    Patient ID: [name], female    DOB: Apr 24, 1927, 79 y.o.   MRN: [id number]  HPI   79 year old patient who is seen today for followup. She has history of type 2 diabetes  Complicated by  neuropathy. She has hypothyroidism atrial fibrillation and remains on Coumadin anticoagulation. She is followed by cardiology. She has a history of hypertension and congestive heart failure. Complaints today include he'll edema and some left foot discomfort.  Medical regimen includes furosemide 20 mg daily. She has a history of permanent pacemaker insertion. No recent lab  Past Medical History  Diagnosis Date  . Osteoporosis   . Hyperlipidemia   . Tremor, essential   . CHF (congestive heart failure)   . Type II or unspecified type diabetes mellitus with neurological manifestations, not stated as uncontrolled(250.60)   . Hypertension   . Thyroid disease   . Diverticulosis of colon   . Atrial fibrillation   . Anxiety   . DM (diabetes mellitus)   . Pain in left ear   . Hypothyroidism   . Atrial fibrillation   . Constipation     History   Social History  . Marital Status: Unknown    Spouse Name: N/A    Number of Children: N/A  . Years of Education: N/A   Occupational History  . Not on file.   Social History Main Topics  . Smoking status: Never Smoker   . Smokeless tobacco: Never Used  . Alcohol Use: No  . Drug Use: No  . Sexually Active: Not on file   Other Topics Concern  . Not on file   Social History Narrative  . No narrative on file    Past Surgical History  Procedure Laterality Date  . Cataract extraction  2002  . Pacemaker placement    . L3 compression fraction    . Transthoracic echocardiogram  2011, 2012    Family History  Problem Relation Age of Onset  . Heart attack Father   . Colon cancer Neg Hx     Allergies  Allergen Reactions  . Codeine Sulfate     REACTION: unspecified  . Simvastatin     REACTION: choking, acid reflux    Current  Outpatient Prescriptions on File Prior to Visit  Medication Sig Dispense Refill  . diltiazem (CARDIZEM CD) 300 MG 24 hr capsule take 1 capsule by mouth once daily  90 capsule  3  . ferrous sulfate 325 (65 FE) MG tablet take 1 tablet by mouth once daily with BREAKFAST  30 tablet  3  . FREESTYLE TEST STRIPS test strip TEST as directed once daily  100 each  4  . glimepiride (AMARYL) 4 MG tablet take 1 tablet by mouth once daily before BREAKFAST  90 tablet  3  . hyoscyamine (LEVBID) 0.375 MG 12 hr tablet THIS IS ONLY TO BE USED AS NEEDED FOR CRAMPING TIMES A DAY  EVERYDAYMUST LAST 30 DAYS  30 tablet  1  . KLOR-CON M10 10 MEQ tablet take 1 tablet by mouth once daily  30 tablet  5  . Lancets (FREESTYLE) lancets TEST UP TO three times a day if needed  100 each  5  . levothyroxine (SYNTHROID, LEVOTHROID) 75 MCG tablet take 1 tablet by mouth once daily  90 tablet  1  . MICARDIS 80 MG tablet take 1 tablet by mouth once daily  90 tablet  3  . polyethylene glycol powder (GLYCOLAX/MIRALAX) powder take 17GM (DISSOLVED IN WATER) by  mouth once daily  255 g  3  . simethicone (MYLICON) 125 MG chewable tablet Chew 125 mg by mouth every 6 (six) hours as needed.        . warfarin (COUMADIN) 2 MG tablet Take 1 tab all days except Mon, Wed, Fri take 1 1/2 tabs  30 tablet  2   No current facility-administered medications on file prior to visit.    BP 100/60  Pulse 86  Temp(Src) 98.2 F (36.8 C) (Oral)  Resp 20  Wt 151 lb (68.493 kg)  BMI 24.74 kg/m2  SpO2 96%     Review of Systems  Constitutional: Negative.   HENT: Negative for hearing loss, congestion, sore throat, rhinorrhea, dental problem, sinus pressure and tinnitus.   Eyes: Negative for pain, discharge and visual disturbance.  Respiratory: Negative for cough and shortness of breath.   Cardiovascular: Positive for leg swelling. Negative for chest pain and palpitations.  Gastrointestinal: Negative for nausea, vomiting, abdominal pain, diarrhea,  constipation, blood in stool and abdominal distention.  Genitourinary: Negative for dysuria, urgency, frequency, hematuria, flank pain, vaginal bleeding, vaginal discharge, difficulty urinating, vaginal pain and pelvic pain.  Musculoskeletal: Positive for back pain and gait problem. Negative for joint swelling and arthralgias.  Skin: Negative for rash.  Neurological: Negative for dizziness, syncope, speech difficulty, weakness, numbness and headaches.  Hematological: Negative for adenopathy.  Psychiatric/Behavioral: Negative for behavioral problems, dysphoric mood and agitation. The patient is not nervous/anxious.        Objective:   Physical Exam  Constitutional: She is oriented to person, place, and time. She appears well-developed and well-nourished.  Blood pressure repeat 130/70  HENT:  Head: Normocephalic.  Right Ear: External ear normal.  Left Ear: External ear normal.  Mouth/Throat: Oropharynx is clear and moist.  Eyes: Conjunctivae and EOM are normal. Pupils are equal, round, and reactive to light.  Neck: Normal range of motion. Neck supple. No thyromegaly present.  Prominent jugular venous pressure  Cardiovascular: Normal rate, regular rhythm, normal heart sounds and intact distal pulses.   Pulmonary/Chest: Effort normal and breath sounds normal. She has no rales.  Abdominal: Soft. Bowel sounds are normal. She exhibits no mass. There is no tenderness.  Musculoskeletal: Normal range of motion. She exhibits edema.  +2 edema left foot greater than the right  Lymphadenopathy:    She has no cervical adenopathy.  Neurological: She is alert and oriented to person, place, and time.  Skin: Skin is warm and dry. No rash noted.  Psychiatric: She has a normal mood and affect. Her behavior is normal.          Assessment & Plan:   Atrial fibrillation Diabetes mellitus. Will check a hemoglobin A1c and urine for microalbumin continue present regimen Hypertension  stable Hypothyroidism  Salt retrition Continue lasix Check lab including HghA1C

## 2013-01-01 NOTE — Progress Notes (Signed)
  Subjective:    Patient ID: [name], female    DOB: 1926/05/23, 79 y.o.   MRN: [id number]  HPI    Review of Systems     Objective:   Physical Exam        Assessment & Plan:

## 2013-01-06 DIAGNOSIS — Z7901 Long term (current) use of anticoagulants: Secondary | ICD-10-CM | POA: Insufficient documentation

## 2013-01-09 ENCOUNTER — Ambulatory Visit (INDEPENDENT_AMBULATORY_CARE_PROVIDER_SITE_OTHER): Payer: Medicare Other | Admitting: General Practice

## 2013-01-09 DIAGNOSIS — I4891 Unspecified atrial fibrillation: Secondary | ICD-10-CM

## 2013-01-09 DIAGNOSIS — Z7901 Long term (current) use of anticoagulants: Secondary | ICD-10-CM

## 2013-01-13 ENCOUNTER — Other Ambulatory Visit: Payer: Self-pay | Admitting: Internal Medicine

## 2013-01-15 ENCOUNTER — Other Ambulatory Visit: Payer: Self-pay | Admitting: Internal Medicine

## 2013-01-18 ENCOUNTER — Other Ambulatory Visit: Payer: Self-pay | Admitting: Internal Medicine

## 2013-01-23 ENCOUNTER — Ambulatory Visit (INDEPENDENT_AMBULATORY_CARE_PROVIDER_SITE_OTHER): Payer: Medicare Other | Admitting: General Practice

## 2013-01-23 DIAGNOSIS — Z7901 Long term (current) use of anticoagulants: Secondary | ICD-10-CM

## 2013-01-23 DIAGNOSIS — I4891 Unspecified atrial fibrillation: Secondary | ICD-10-CM

## 2013-01-30 ENCOUNTER — Other Ambulatory Visit: Payer: Self-pay | Admitting: Internal Medicine

## 2013-02-18 ENCOUNTER — Encounter: Payer: Self-pay | Admitting: Internal Medicine

## 2013-02-20 ENCOUNTER — Ambulatory Visit: Payer: Medicare Other

## 2013-02-27 ENCOUNTER — Ambulatory Visit (INDEPENDENT_AMBULATORY_CARE_PROVIDER_SITE_OTHER): Payer: Medicare Other | Admitting: General Practice

## 2013-02-27 DIAGNOSIS — Z7901 Long term (current) use of anticoagulants: Secondary | ICD-10-CM

## 2013-02-27 DIAGNOSIS — I4891 Unspecified atrial fibrillation: Secondary | ICD-10-CM

## 2013-03-03 ENCOUNTER — Other Ambulatory Visit: Payer: Self-pay | Admitting: Internal Medicine

## 2013-03-10 ENCOUNTER — Ambulatory Visit (INDEPENDENT_AMBULATORY_CARE_PROVIDER_SITE_OTHER): Payer: Medicare Other | Admitting: *Deleted

## 2013-03-10 DIAGNOSIS — I4891 Unspecified atrial fibrillation: Secondary | ICD-10-CM

## 2013-03-10 LAB — PACEMAKER DEVICE OBSERVATION
BMOD-0003RV: 30
BRDY-0004RV: 115 {beats}/min
DEVICE MODEL PM: 261399
RV LEAD THRESHOLD: 0.75 V
VENTRICULAR PACING PM: 100

## 2013-03-10 NOTE — Progress Notes (Signed)
Pacemaker check in clinic. Normal device function. Thresholds, sensing, impedances consistent with previous measurements. Device programmed to maximize longevity. No  high ventricular rates noted. Device programmed at appropriate safety margins. Histogram distribution appropriate for patient activity level. Device programmed to optimize intrinsic conduction. Estimated longevity 8 years.  Patient education completed.  ROV 6 months with Dr. Ladona Ridgel.

## 2013-03-15 ENCOUNTER — Other Ambulatory Visit: Payer: Self-pay | Admitting: Internal Medicine

## 2013-03-17 ENCOUNTER — Encounter: Payer: Self-pay | Admitting: Internal Medicine

## 2013-03-27 ENCOUNTER — Ambulatory Visit (INDEPENDENT_AMBULATORY_CARE_PROVIDER_SITE_OTHER): Payer: Medicare Other | Admitting: General Practice

## 2013-03-27 ENCOUNTER — Other Ambulatory Visit: Payer: Self-pay | Admitting: General Practice

## 2013-03-27 DIAGNOSIS — Z7901 Long term (current) use of anticoagulants: Secondary | ICD-10-CM

## 2013-03-27 DIAGNOSIS — I4891 Unspecified atrial fibrillation: Secondary | ICD-10-CM

## 2013-03-27 MED ORDER — WARFARIN SODIUM 2 MG PO TABS: ORAL_TABLET | ORAL | Status: DC

## 2013-03-31 ENCOUNTER — Other Ambulatory Visit: Payer: Self-pay | Admitting: Internal Medicine

## 2013-04-07 ENCOUNTER — Telehealth: Payer: Self-pay | Admitting: Internal Medicine

## 2013-04-07 MED ORDER — GLUCOSE BLOOD VI STRP: 1.0000 | ORAL_STRIP | Freq: Every day | Status: DC | PRN

## 2013-04-07 NOTE — Telephone Encounter (Signed)
Spoke to pt told her I have a Freestyle Meter here for her along with coupon for Test strips will be at the front desk for pick up. Pt verbalized understanding. Rx for Test strips sent to pharmacy.

## 2013-04-07 NOTE — Telephone Encounter (Signed)
Requesting order for Glucometer and Test Strips

## 2013-04-07 NOTE — Telephone Encounter (Signed)
Spoke to pt asked her where she is getting Diabetic supplies from? Pt stated [facility]. Told pt okay I will let pharmacy know. Called [facility] spoke with Princess told her pt is not your pharmacy for supplies. Princess verbalized understanding.

## 2013-04-07 NOTE — Telephone Encounter (Signed)
Pt wants to know if it's okay for her to get a new freestyle monitor?

## 2013-04-14 ENCOUNTER — Other Ambulatory Visit: Payer: Self-pay | Admitting: Internal Medicine

## 2013-04-24 ENCOUNTER — Encounter: Payer: Self-pay | Admitting: Internal Medicine

## 2013-04-24 ENCOUNTER — Ambulatory Visit (INDEPENDENT_AMBULATORY_CARE_PROVIDER_SITE_OTHER): Payer: Medicare Other | Admitting: Internal Medicine

## 2013-04-24 ENCOUNTER — Ambulatory Visit (INDEPENDENT_AMBULATORY_CARE_PROVIDER_SITE_OTHER): Payer: Medicare Other | Admitting: General Practice

## 2013-04-24 VITALS — BP 122/80 | HR 74 | Temp 98.1°F | Resp 20 | Wt 149.0 lb

## 2013-04-24 DIAGNOSIS — I10 Essential (primary) hypertension: Secondary | ICD-10-CM

## 2013-04-24 DIAGNOSIS — G250 Essential tremor: Secondary | ICD-10-CM

## 2013-04-24 DIAGNOSIS — E039 Hypothyroidism, unspecified: Secondary | ICD-10-CM

## 2013-04-24 DIAGNOSIS — Z7901 Long term (current) use of anticoagulants: Secondary | ICD-10-CM

## 2013-04-24 DIAGNOSIS — E785 Hyperlipidemia, unspecified: Secondary | ICD-10-CM

## 2013-04-24 DIAGNOSIS — I4891 Unspecified atrial fibrillation: Secondary | ICD-10-CM

## 2013-04-24 DIAGNOSIS — Z23 Encounter for immunization: Secondary | ICD-10-CM

## 2013-04-24 DIAGNOSIS — E1149 Type 2 diabetes mellitus with other diabetic neurological complication: Secondary | ICD-10-CM

## 2013-04-24 DIAGNOSIS — E119 Type 2 diabetes mellitus without complications: Secondary | ICD-10-CM

## 2013-04-24 LAB — POCT INR: INR: 1.8

## 2013-04-24 MED ORDER — MECLIZINE HCL 25 MG PO TABS: 25.0000 mg | ORAL_TABLET | Freq: Three times a day (TID) | ORAL | Status: DC | PRN

## 2013-04-24 MED ORDER — DIAZEPAM 5 MG PO TABS: ORAL_TABLET | ORAL | Status: DC

## 2013-04-24 NOTE — Progress Notes (Signed)
Pre-visit discussion using our clinic review tool. No additional management support is needed unless otherwise documented below in the visit note.  

## 2013-04-24 NOTE — Patient Instructions (Signed)
Limit your sodium (Salt) intake   Please check your hemoglobin A1c every 3 months  Take a calcium supplement, plus [phone] units of vitamin D

## 2013-04-24 NOTE — Progress Notes (Signed)
Subjective:    Patient ID: [name], female    DOB: 1926-07-16, 79 y.o.   MRN: [id number]  HPI Pre-visit discussion using our clinic review tool. No additional management support is needed unless otherwise documented below in the visit note.  79 year old patient who is seen today for followup. She has a history of type 2 diabetes which has been well-controlled. She has a history of hypertension and paroxysmal atrial for ablation. She remains on Coumadin anticoagulation and is followed by cardiology. She has osteoporosis and a history of essential tremor. Other than some mild insomnia issues she seems to doing quite well.  Wt Readings from Last 3 Encounters:  04/24/13 149 lb (67.586 kg)  12/31/12 151 lb (68.493 kg)  09/11/12 144 lb 9.6 oz (65.59 kg)   Past Medical History  Diagnosis Date  . Osteoporosis   . Hyperlipidemia   . Tremor, essential   . CHF (congestive heart failure)   . Type II or unspecified type diabetes mellitus with neurological manifestations, not stated as uncontrolled(250.60)   . Hypertension   . Thyroid disease   . Diverticulosis of colon   . Atrial fibrillation   . Anxiety   . DM (diabetes mellitus)   . Pain in left ear   . Hypothyroidism   . Atrial fibrillation   . Constipation     History   Social History  . Marital Status: Unknown    Spouse Name: N/A    Number of Children: N/A  . Years of Education: N/A   Occupational History  . Not on file.   Social History Main Topics  . Smoking status: Never Smoker   . Smokeless tobacco: Never Used  . Alcohol Use: No  . Drug Use: No  . Sexual Activity: Not on file   Other Topics Concern  . Not on file   Social History Narrative  . No narrative on file    Past Surgical History  Procedure Laterality Date  . Cataract extraction  2002  . Pacemaker placement    . L3 compression fraction    . Transthoracic echocardiogram  2011, 2012    Family History  Problem Relation Age of Onset  .  Heart attack Father   . Colon cancer Neg Hx     Allergies  Allergen Reactions  . Codeine Sulfate     REACTION: unspecified  . Simvastatin     REACTION: choking, acid reflux    Current Outpatient Prescriptions on File Prior to Visit  Medication Sig Dispense Refill  . acetaminophen (TYLENOL) 650 MG CR tablet Take 650 mg by mouth every 8 (eight) hours as needed for pain.      Marland Kitchen diltiazem (CARDIZEM CD) 300 MG 24 hr capsule take 1 capsule by mouth once daily  90 capsule  3  . diphenhydrAMINE (SOMINEX) 25 MG tablet Take 25 mg by mouth as needed for sleep.      . ferrous sulfate 325 (65 FE) MG tablet take 1 tablet by mouth once daily with BREAKFAST  30 tablet  3  . furosemide (LASIX) 20 MG tablet Take 1 tablet (20 mg total) by mouth daily.  90 tablet  1  . glimepiride (AMARYL) 4 MG tablet take 1 tablet once daily BEFORE BREAKFAST  90 tablet  3  . glucose blood (FREESTYLE TEST STRIPS) test strip 1 each by Other route daily as needed for other.  100 each  4  . hyoscyamine (LEVBID) 0.375 MG 12 hr tablet TAKE 1 TABLET BY  MOUTH TWICE A DAY IF NEEDED. ONLY AS NEEDED FOR CRAMPING. DO NOT TAKE EVERYDAY. MUST LAST 30 DAYS.  30 tablet  2  . KLOR-CON M10 10 MEQ tablet take 1 tablet by mouth once daily  30 tablet  5  . Lancets (FREESTYLE) lancets TEST UP TO three times a day if needed  100 each  5  . levothyroxine (SYNTHROID, LEVOTHROID) 75 MCG tablet take 1 tablet by mouth once daily  90 tablet  1  . MICARDIS 80 MG tablet take 1 tablet by mouth once daily  90 tablet  3  . polyethylene glycol powder (GLYCOLAX/MIRALAX) powder take 17GM (DISSOLVED IN WATER) by mouth once daily  255 g  3  . simethicone (MYLICON) 125 MG chewable tablet Chew 125 mg by mouth every 6 (six) hours as needed.        . warfarin (COUMADIN) 2 MG tablet Take as directed by anticoagulation clinic  120 tablet  0   No current facility-administered medications on file prior to visit.    BP 122/80  Pulse 74  Temp(Src) 98.1 F (36.7 C)  (Oral)  Resp 20  Wt 149 lb (67.586 kg)  SpO2 98%    Review of Systems  Constitutional: Negative.   HENT: Negative for congestion, dental problem, hearing loss, rhinorrhea, sinus pressure, sore throat and tinnitus.   Eyes: Negative for pain, discharge and visual disturbance.  Respiratory: Negative for cough and shortness of breath.   Cardiovascular: Negative for chest pain, palpitations and leg swelling.  Gastrointestinal: Negative for nausea, vomiting, abdominal pain, diarrhea, constipation, blood in stool and abdominal distention.  Genitourinary: Negative for dysuria, urgency, frequency, hematuria, flank pain, vaginal bleeding, vaginal discharge, difficulty urinating, vaginal pain and pelvic pain.  Musculoskeletal: Negative for arthralgias, gait problem and joint swelling.  Skin: Negative for rash.  Neurological: Negative for dizziness, syncope, speech difficulty, weakness, numbness and headaches.  Hematological: Negative for adenopathy.  Psychiatric/Behavioral: Positive for sleep disturbance. Negative for behavioral problems, dysphoric mood and agitation. The patient is not nervous/anxious.        Objective:   Physical Exam  Constitutional: She is oriented to person, place, and time. She appears well-developed and well-nourished.  HENT:  Head: Normocephalic.  Right Ear: External ear normal.  Left Ear: External ear normal.  Mouth/Throat: Oropharynx is clear and moist.  Eyes: Conjunctivae and EOM are normal. Pupils are equal, round, and reactive to light.  Neck: Normal range of motion. Neck supple. No thyromegaly present.  Cardiovascular: Normal rate, regular rhythm, normal heart sounds and intact distal pulses.    left posterior tibial in the right dorsalis pedis pulses intact  Paced rhythm  Pulmonary/Chest: Effort normal and breath sounds normal.  Abdominal: Soft. Bowel sounds are normal. She exhibits no mass. There is no tenderness.  Musculoskeletal: Normal range of motion.    Lymphadenopathy:    She has no cervical adenopathy.  Neurological: She is alert and oriented to person, place, and time.  Skin: Skin is warm and dry. No rash noted.  Psychiatric: She has a normal mood and affect. Her behavior is normal.          Assessment & Plan:   Diabetes mellitus. We'll check a hemoglobin A1c Hypertension Atrial fibrillation stable History congestive heart failure stable Essential tremor  Medicines updated Check a hemoglobin A1c CPX 4 months

## 2013-05-09 ENCOUNTER — Other Ambulatory Visit: Payer: Self-pay | Admitting: Internal Medicine

## 2013-05-09 ENCOUNTER — Telehealth: Payer: Self-pay | Admitting: Internal Medicine

## 2013-05-09 NOTE — Telephone Encounter (Signed)
New message    Pt has a pacemaker---will a neck & shoulder massager with heat interfere with her pacemaker?

## 2013-05-09 NOTE — Telephone Encounter (Signed)
Left message to call [employer] tech support any further questions but to our knowledge that should not interfere with her PPM.

## 2013-05-26 ENCOUNTER — Ambulatory Visit: Payer: Medicare Other

## 2013-05-26 ENCOUNTER — Encounter: Payer: Self-pay | Admitting: Family

## 2013-05-26 ENCOUNTER — Ambulatory Visit (INDEPENDENT_AMBULATORY_CARE_PROVIDER_SITE_OTHER): Payer: Medicare Other | Admitting: Family

## 2013-05-26 ENCOUNTER — Ambulatory Visit (INDEPENDENT_AMBULATORY_CARE_PROVIDER_SITE_OTHER): Payer: Medicare Other | Admitting: General Practice

## 2013-05-26 VITALS — BP 116/58 | HR 88 | Wt 159.0 lb

## 2013-05-26 DIAGNOSIS — R1032 Left lower quadrant pain: Secondary | ICD-10-CM

## 2013-05-26 DIAGNOSIS — I4891 Unspecified atrial fibrillation: Secondary | ICD-10-CM

## 2013-05-26 DIAGNOSIS — K5732 Diverticulitis of large intestine without perforation or abscess without bleeding: Secondary | ICD-10-CM

## 2013-05-26 DIAGNOSIS — K5792 Diverticulitis of intestine, part unspecified, without perforation or abscess without bleeding: Secondary | ICD-10-CM

## 2013-05-26 LAB — POCT INR: INR: 2.4

## 2013-05-26 MED ORDER — METRONIDAZOLE 500 MG PO TABS: 500.0000 mg | ORAL_TABLET | Freq: Two times a day (BID) | ORAL | Status: DC

## 2013-05-26 MED ORDER — AMOXICILLIN 500 MG PO TABS: 1000.0000 mg | ORAL_TABLET | Freq: Two times a day (BID) | ORAL | Status: AC

## 2013-05-26 NOTE — Patient Instructions (Addendum)
Recheck INR in 1 week.   Diverticulitis A diverticulum is a small pouch or sac on the colon. Diverticulosis is the presence of these diverticula on the colon. Diverticulitis is the irritation (inflammation) or infection of diverticula. CAUSES  The colon and its diverticula contain bacteria. If food particles block the tiny opening to a diverticulum, the bacteria inside can grow and cause an increase in pressure. This leads to infection and inflammation and is called diverticulitis. SYMPTOMS   Abdominal pain and tenderness. Usually, the pain is located on the left side of your abdomen. However, it could be located elsewhere.  Fever.  Bloating.  Feeling sick to your stomach (nausea).  Throwing up (vomiting).  Abnormal stools. DIAGNOSIS  Your caregiver will take a history and perform a physical exam. Since many things can cause abdominal pain, other tests may be necessary. Tests may include:  Blood tests.  Urine tests.  X-ray of the abdomen.  CT scan of the abdomen. Sometimes, surgery is needed to determine if diverticulitis or other conditions are causing your symptoms. TREATMENT  Most of the time, you can be treated without surgery. Treatment includes:  Resting the bowels by only having liquids for a few days. As you improve, you will need to eat a low-fiber diet.  Intravenous (IV) fluids if you are losing body fluids (dehydrated).  Antibiotic medicines that treat infections may be given.  Pain and nausea medicine, if needed.  Surgery if the inflamed diverticulum has burst. HOME CARE INSTRUCTIONS   Try a clear liquid diet (broth, tea, or water for as long as directed by your caregiver). You may then gradually begin a low-fiber diet as tolerated.  A low-fiber diet is a diet with less than 10 grams of fiber. Choose the foods below to reduce fiber in the diet:  White breads, cereals, rice, and pasta.  Cooked fruits and vegetables or soft fresh fruits and vegetables  without the skin.  Ground or well-cooked tender beef, ham, veal, lamb, pork, or poultry.  Eggs and seafood.  After your diverticulitis symptoms have improved, your caregiver may put you on a high-fiber diet. A high-fiber diet includes 14 grams of fiber for every 1000 calories consumed. For a standard 2000 calorie diet, you would need 28 grams of fiber. Follow these diet guidelines to help you increase the fiber in your diet. It is important to slowly increase the amount fiber in your diet to avoid gas, constipation, and bloating.  Choose whole-grain breads, cereals, pasta, and brown rice.  Choose fresh fruits and vegetables with the skin on. Do not overcook vegetables because the more vegetables are cooked, the more fiber is lost.  Choose more nuts, seeds, legumes, dried peas, beans, and lentils.  Look for food products that have greater than 3 grams of fiber per serving on the Nutrition Facts label.  Take all medicine as directed by your caregiver.  If your caregiver has given you a follow-up appointment, it is very important that you go. Not going could result in lasting (chronic) or permanent injury, pain, and disability. If there is any problem keeping the appointment, call to reschedule. SEEK MEDICAL CARE IF:   Your pain does not improve.  You have a hard time advancing your diet beyond clear liquids.  Your bowel movements do not return to normal. SEEK IMMEDIATE MEDICAL CARE IF:   Your pain becomes worse.  You have an oral temperature above 102 F (38.9 C), not controlled by medicine.  You have repeated vomiting.  You  have bloody or black, tarry stools.  Symptoms that brought you to your caregiver become worse or are not getting better. MAKE SURE YOU:   Understand these instructions.  Will watch your condition.  Will get help right away if you are not doing well or get worse. Document Released: 02/15/2005 Document Revised: 07/31/2011 Document Reviewed:  06/13/2010 [facility] Patient Information 2014 [place], [place].

## 2013-05-26 NOTE — Progress Notes (Signed)
Pre-visit discussion using our clinic review tool. No additional management support is needed unless otherwise documented below in the visit note.  

## 2013-05-26 NOTE — Progress Notes (Signed)
Subjective:    Patient ID: [name], female    DOB: 01-20-1927, 80 y.o.   MRN: [id number]  HPI 80 year old white female, nonsmoker presents today with complaints of left lower quadrant pain. She has a history of diverticulitis in the past many flares over the last several years. She feels like this is another acute flare. Denies any blood in her stools are dark black stools. Denies fever, muscle aches or pain.   Review of Systems  Respiratory: Negative.   Cardiovascular: Negative.   Gastrointestinal: Positive for abdominal pain. Negative for nausea, diarrhea, constipation and abdominal distention.       Left lower quadrant pain   Endocrine: Negative.   Musculoskeletal: Negative.   Skin: Negative.   Allergic/Immunologic: Negative.   Neurological: Negative.   Psychiatric/Behavioral: Negative.    Past Medical History  Diagnosis Date  . Osteoporosis   . Hyperlipidemia   . Tremor, essential   . CHF (congestive heart failure)   . Type II or unspecified type diabetes mellitus with neurological manifestations, not stated as uncontrolled(250.60)   . Hypertension   . Thyroid disease   . Diverticulosis of colon   . Atrial fibrillation   . Anxiety   . DM (diabetes mellitus)   . Pain in left ear   . Hypothyroidism   . Atrial fibrillation   . Constipation     History   Social History  . Marital Status: Unknown    Spouse Name: N/A    Number of Children: N/A  . Years of Education: N/A   Occupational History  . Not on file.   Social History Main Topics  . Smoking status: Never Smoker   . Smokeless tobacco: Never Used  . Alcohol Use: No  . Drug Use: No  . Sexual Activity: Not on file   Other Topics Concern  . Not on file   Social History Narrative  . No narrative on file    Past Surgical History  Procedure Laterality Date  . Cataract extraction  2002  . Pacemaker placement    . L3 compression fraction    . Transthoracic echocardiogram  2011, 2012     Family History  Problem Relation Age of Onset  . Heart attack Father   . Colon cancer Neg Hx     Allergies  Allergen Reactions  . Codeine Sulfate     REACTION: unspecified  . Simvastatin     REACTION: choking, acid reflux    Current Outpatient Prescriptions on File Prior to Visit  Medication Sig Dispense Refill  . diltiazem (CARDIZEM CD) 300 MG 24 hr capsule take 1 capsule by mouth once daily  90 capsule  3  . ferrous sulfate 325 (65 FE) MG tablet take 1 tablet by mouth once daily with BREAKFAST  30 tablet  3  . furosemide (LASIX) 20 MG tablet Take 1 tablet (20 mg total) by mouth daily.  90 tablet  1  . glimepiride (AMARYL) 4 MG tablet take 1 tablet once daily BEFORE BREAKFAST  90 tablet  3  . hyoscyamine (LEVBID) 0.375 MG 12 hr tablet TAKE 1 TABLET BY MOUTH TWICE A DAY IF NEEDED. ONLY AS NEEDED FOR CRAMPING. DO NOT TAKE EVERYDAY. MUST LAST 30 DAYS.  30 tablet  2  . levothyroxine (SYNTHROID, LEVOTHROID) 75 MCG tablet take 1 tablet by mouth once daily  90 tablet  1  . meclizine (ANTIVERT) 25 MG tablet Take 1 tablet (25 mg total) by mouth 3 (three) times daily as needed  for dizziness.  30 tablet  4  . warfarin (COUMADIN) 2 MG tablet Take as directed by anticoagulation clinic  120 tablet  0  . acetaminophen (TYLENOL) 650 MG CR tablet Take 650 mg by mouth every 8 (eight) hours as needed for pain.      . diazepam (VALIUM) 5 MG tablet take 1 tablet by mouth every 8 hours if needed for anxiety or sleep  60 tablet  0  . diphenhydrAMINE (SOMINEX) 25 MG tablet Take 25 mg by mouth as needed for sleep.      Marland Kitchen glucose blood (FREESTYLE TEST STRIPS) test strip 1 each by Other route daily as needed for other.  100 each  4  . KLOR-CON M10 10 MEQ tablet take 1 tablet by mouth once daily  30 tablet  5  . Lancets (FREESTYLE) lancets TEST UP TO three times a day if needed  100 each  5  . MICARDIS 80 MG tablet take 1 tablet by mouth once daily  90 tablet  3  . polyethylene glycol powder  (GLYCOLAX/MIRALAX) powder take 17GM (DISSOLVED IN WATER) by mouth once daily  255 g  3  . simethicone (MYLICON) 125 MG chewable tablet Chew 125 mg by mouth every 6 (six) hours as needed.         No current facility-administered medications on file prior to visit.    BP 116/58  Pulse 88  Wt 159 lb (72.122 kg)chart     Objective:   Physical Exam  Constitutional: She is oriented to person, place, and time. She appears well-developed and well-nourished.  Neck: Normal range of motion. Neck supple.  Cardiovascular: Normal rate, regular rhythm and normal heart sounds.   Pulmonary/Chest: Effort normal and breath sounds normal.  Abdominal: Soft. There is tenderness. There is no rebound and no guarding.  Musculoskeletal: Normal range of motion.  Neurological: She is alert and oriented to person, place, and time.  Skin: Skin is warm and dry.  Psychiatric: She has a normal mood and affect.          Assessment & Plan:  Assessment: 1. Diverticulitis 2. Hypertension  Plan: Amoxicillin 1 g twice a day x10 days. Flagyl 500 mg twice a day x10 days. Call the office if symptoms worsen or persist. Recheck as scheduled, and as needed

## 2013-05-26 NOTE — Progress Notes (Signed)
Pre visit review using our clinic review tool, if applicable. No additional management support is needed unless otherwise documented below in the visit note. 

## 2013-06-02 ENCOUNTER — Other Ambulatory Visit: Payer: Medicare Other

## 2013-06-02 ENCOUNTER — Ambulatory Visit: Payer: Medicare Other

## 2013-06-02 DIAGNOSIS — I4891 Unspecified atrial fibrillation: Secondary | ICD-10-CM

## 2013-06-05 ENCOUNTER — Other Ambulatory Visit: Payer: Self-pay | Admitting: Internal Medicine

## 2013-06-10 ENCOUNTER — Telehealth: Payer: Self-pay | Admitting: Internal Medicine

## 2013-06-10 NOTE — Telephone Encounter (Signed)
[facility] Triage Call Report Triage Record Num: [id number] Operator: Baldomero LamyKimberly Barnwell [facility] Call Date & Time: 06/09/2013 5:00:50PM Patient Phone: [phone]) [phone] PCP: Gordy SaversPeter F. Kwiatkowski Patient Gender: Female PCP Fax : [phone] Patient DOB: [birth year] [facility] Reason for Call: Caller: Anne/Other; PCP: Eleonore ChiquitoKwiatkowski, Peter (Family Practice > 8965yrs old); CB#: [phone]; Granddaughter calling regarding pt c/o abdominal pain and pressure with bloating. Onset 05/15/13. Pt has a hx of diverticulitis. Seen for diverticulitis flare up, on 05/26/13. Prescribed Flagyl and Amox that she did nost start until 05/31/13; so not finished. Upon further triage, pt states she feels more lower abdominal pressure: "like something is going to fall out" when she sits on the commode. Denies any urinary sx other than "not urinating as much as she thinks she should" as she has increased fluids while taking antibx. See Provider within 24 hrs for: New onset constant mild or aching lower abdominal pain per Abdominal Pain protocol. Appt made for 06/11/13 at 10 am with Dr. Kirtland BouchardK per grandaughter's prefernce. Care advice given with call back parameters. Advised to continue antbx. Protocol(s) Used: Abdominal Pain Recommended Outcome per Protocol: See Provider within 24 hours Reason for Outcome: Abdominal discomfort (feeling of fullness/bloating, nausea) AND has started new prescription, nonprescription or alternative medication OR change in dose or frequency of usual medication Care Advice: Do not take aspirin, ibuprofen, ketoprofen, naproxen, etc., or other pain relieving medications until consulting with provider. ~ ~ SYMPTOM / CONDITION MANAGEMENT ~ CAUTIONS Medication Advice: - Discontinue all nonprescription and alternative medications, especially stimulants, until evaluated by provider. - Take prescribed medications as directed, following label instructions  for the medication. - Do not change medications or dosing regimen until provider is consulted. - Know possible side effects of medication and what to do if they occur. - Tell provider all prescription, nonprescription or alternative medications that you take ~ Call provider during regular office hours for an appointment and to get instructions regarding continuing or changing medication or changing your treatment plan. ~ 01/

## 2013-06-11 ENCOUNTER — Encounter: Payer: Self-pay | Admitting: Internal Medicine

## 2013-06-11 ENCOUNTER — Ambulatory Visit (INDEPENDENT_AMBULATORY_CARE_PROVIDER_SITE_OTHER): Payer: Medicare Other | Admitting: Internal Medicine

## 2013-06-11 VITALS — BP 140/70 | HR 73 | Resp 20 | Ht 65.5 in | Wt 160.0 lb

## 2013-06-11 DIAGNOSIS — E119 Type 2 diabetes mellitus without complications: Secondary | ICD-10-CM

## 2013-06-11 DIAGNOSIS — I4891 Unspecified atrial fibrillation: Secondary | ICD-10-CM

## 2013-06-11 DIAGNOSIS — I10 Essential (primary) hypertension: Secondary | ICD-10-CM

## 2013-06-11 DIAGNOSIS — F411 Generalized anxiety disorder: Secondary | ICD-10-CM

## 2013-06-11 LAB — GLUCOSE, POCT (MANUAL RESULT ENTRY): POC Glucose: 209 mg/dl — AB (ref 70–99)

## 2013-06-11 MED ORDER — GLIMEPIRIDE 2 MG PO TABS: ORAL_TABLET | ORAL | Status: DC

## 2013-06-11 NOTE — Progress Notes (Signed)
Pre-visit discussion using our clinic review tool. No additional management support is needed unless otherwise documented below in the visit note.  

## 2013-06-11 NOTE — Progress Notes (Signed)
Subjective:    Patient ID: [name], female    DOB: August 03, 1926, 80 y.o.   MRN: [id number]  HPI  80 year old patient who is seen today accompanied by a granddaughter. Her this morning she attempted to remove an ingrown hair with a razor and sustained a superficial laceration involving her left upper outer lip. This has continued to bleed for a couple of hours   she has been seen and treated her recently for suspected acute left-sided diverticulitis. She continues to have some mild gas and bloating but no further pain. She complains of some constipation issues   she has type 2 diabetes which has been tightly controlled. She has a history of anxiety and essential tremor but does complain of episodes of mild shakiness that may be hypoglycemia. She remains on Amaryl 4 mg daily. This will be decreased today  Past Medical History  Diagnosis Date  . Osteoporosis   . Hyperlipidemia   . Tremor, essential   . CHF (congestive heart failure)   . Type II or unspecified type diabetes mellitus with neurological manifestations, not stated as uncontrolled   . Hypertension   . Thyroid disease   . Diverticulosis of colon   . Atrial fibrillation   . Anxiety   . DM (diabetes mellitus)   . Pain in left ear   . Hypothyroidism   . Atrial fibrillation   . Constipation     History   Social History  . Marital Status: Unknown    Spouse Name: N/A    Number of Children: N/A  . Years of Education: N/A   Occupational History  . Not on file.   Social History Main Topics  . Smoking status: Never Smoker   . Smokeless tobacco: Never Used  . Alcohol Use: No  . Drug Use: No  . Sexual Activity: Not on file   Other Topics Concern  . Not on file   Social History Narrative  . No narrative on file    Past Surgical History  Procedure Laterality Date  . Cataract extraction  2002  . Pacemaker placement    . L3 compression fraction    . Transthoracic echocardiogram  2011, 2012    Family  History  Problem Relation Age of Onset  . Heart attack Father   . Colon cancer Neg Hx     Allergies  Allergen Reactions  . Codeine Sulfate     REACTION: unspecified  . Simvastatin     REACTION: choking, acid reflux    Current Outpatient Prescriptions on File Prior to Visit  Medication Sig Dispense Refill  . acetaminophen (TYLENOL) 650 MG CR tablet Take 650 mg by mouth every 8 (eight) hours as needed for pain.      . diazepam (VALIUM) 5 MG tablet take 1 tablet by mouth every 8 hours if needed for sleep and anxiety  30 tablet  2  . diltiazem (CARDIZEM CD) 300 MG 24 hr capsule take 1 capsule by mouth once daily  90 capsule  3  . diphenhydrAMINE (SOMINEX) 25 MG tablet Take 25 mg by mouth as needed for sleep.      . ferrous sulfate 325 (65 FE) MG tablet take 1 tablet by mouth once daily with BREAKFAST  30 tablet  3  . furosemide (LASIX) 20 MG tablet Take 1 tablet (20 mg total) by mouth daily.  90 tablet  1  . glimepiride (AMARYL) 4 MG tablet take 1 tablet once daily BEFORE BREAKFAST  90 tablet  3  . glucose blood (FREESTYLE TEST STRIPS) test strip 1 each by Other route daily as needed for other.  100 each  4  . hyoscyamine (LEVBID) 0.375 MG 12 hr tablet TAKE 1 TABLET BY MOUTH TWICE A DAY IF NEEDED. ONLY AS NEEDED FOR CRAMPING. DO NOT TAKE EVERYDAY. MUST LAST 30 DAYS.  30 tablet  2  . KLOR-CON M10 10 MEQ tablet take 1 tablet by mouth once daily  30 tablet  5  . Lancets (FREESTYLE) lancets TEST UP TO three times a day if needed  100 each  5  . levothyroxine (SYNTHROID, LEVOTHROID) 75 MCG tablet take 1 tablet by mouth once daily  90 tablet  1  . meclizine (ANTIVERT) 25 MG tablet Take 1 tablet (25 mg total) by mouth 3 (three) times daily as needed for dizziness.  30 tablet  4  . metroNIDAZOLE (FLAGYL) 500 MG tablet Take 1 tablet (500 mg total) by mouth 2 (two) times daily.  20 tablet  0  . MICARDIS 80 MG tablet take 1 tablet by mouth once daily  90 tablet  3  . polyethylene glycol powder  (GLYCOLAX/MIRALAX) powder take 17GM (DISSOLVED IN WATER) by mouth once daily  255 g  3  . simethicone (MYLICON) 125 MG chewable tablet Chew 125 mg by mouth every 6 (six) hours as needed.        . warfarin (COUMADIN) 2 MG tablet Take as directed by anticoagulation clinic  120 tablet  0   No current facility-administered medications on file prior to visit.    BP 140/70  Pulse 73  Resp 20  Ht 5' 5.5" (1.664 m)  Wt 160 lb (72.576 kg)  BMI 26.21 kg/m2  SpO2 98%       Review of Systems  Constitutional: Negative.   HENT: Negative for congestion, dental problem, hearing loss, rhinorrhea, sinus pressure, sore throat and tinnitus.   Eyes: Negative for pain, discharge and visual disturbance.  Respiratory: Negative for cough and shortness of breath.   Cardiovascular: Negative for chest pain, palpitations and leg swelling.  Gastrointestinal: Positive for constipation and abdominal distention. Negative for nausea, vomiting, abdominal pain, diarrhea and blood in stool.  Genitourinary: Negative for dysuria, urgency, frequency, hematuria, flank pain, vaginal bleeding, vaginal discharge, difficulty urinating, vaginal pain and pelvic pain.  Musculoskeletal: Negative for arthralgias, gait problem and joint swelling.  Skin: Positive for wound. Negative for rash.  Neurological: Negative for dizziness, syncope, speech difficulty, weakness, numbness and headaches.  Hematological: Negative for adenopathy.  Psychiatric/Behavioral: Negative for behavioral problems, dysphoric mood and agitation. The patient is not nervous/anxious.        Objective:   Physical Exam  Constitutional: She is oriented to person, place, and time. She appears well-developed and well-nourished.  HENT:  Head: Normocephalic.  Right Ear: External ear normal.  Left Ear: External ear normal.  Mouth/Throat: Oropharynx is clear and moist.  Eyes: Conjunctivae and EOM are normal. Pupils are equal, round, and reactive to light.    Neck: Normal range of motion. Neck supple. No thyromegaly present.  Cardiovascular: Normal rate, regular rhythm, normal heart sounds and intact distal pulses.   Pulmonary/Chest: Effort normal and breath sounds normal.  Abdominal: Soft. Bowel sounds are normal. She exhibits no distension and no mass. There is no tenderness. There is no rebound and no guarding.  Musculoskeletal: Normal range of motion.  Lymphadenopathy:    She has no cervical adenopathy.  Neurological: She is alert and oriented to person, place, and time.  Skin: Skin is warm and dry. No rash noted.  5 mm superficial laceration left upper outer lip Chemical hemostasis obtained  Psychiatric: She has a normal mood and affect. Her behavior is normal.          Assessment & Plan:   Laceration left upper lip. Hemostasis obtained Diabetes mellitus. Will decrease Amaryl to 2 mg daily. Recheck hemoglobin A1c in 2 months Hypertension well controlled Status post diverticulitis

## 2013-06-11 NOTE — Patient Instructions (Signed)
Decrease Amaryl to 2 mg daily  Limit your sodium (Salt) intake   Please check your hemoglobin A1c every 3 months  Discontinue antibiotics

## 2013-06-13 ENCOUNTER — Other Ambulatory Visit: Payer: Self-pay | Admitting: *Deleted

## 2013-06-13 ENCOUNTER — Telehealth: Payer: Self-pay

## 2013-06-13 MED ORDER — ACCU-CHEK AVIVA PLUS W/DEVICE KIT: 1.0000 | PACK | Freq: Every day | Status: DC | PRN

## 2013-06-13 MED ORDER — ACCU-CHEK SOFT TOUCH LANCETS MISC: 1.0000 | Freq: Every day | Status: DC | PRN

## 2013-06-13 MED ORDER — GLUCOSE BLOOD VI STRP: 1.0000 | ORAL_STRIP | Freq: Every day | Status: DC | PRN

## 2013-06-13 NOTE — Telephone Encounter (Signed)
Relevant patient education mailed to patient.  

## 2013-06-13 NOTE — Telephone Encounter (Signed)
Spoke to pt told her new Rx's sent to pharmacy [facility] for Glucose Meter, test strips and lancet Accu-chek Aviva Plus. Insurance will not cover Freestyle anymore. Pt verbalized understanding.

## 2013-06-23 ENCOUNTER — Ambulatory Visit: Payer: Medicare Other

## 2013-06-25 ENCOUNTER — Telehealth: Payer: Self-pay | Admitting: Internal Medicine

## 2013-06-25 ENCOUNTER — Ambulatory Visit (INDEPENDENT_AMBULATORY_CARE_PROVIDER_SITE_OTHER): Payer: Medicare Other | Admitting: Family

## 2013-06-25 DIAGNOSIS — Z7901 Long term (current) use of anticoagulants: Secondary | ICD-10-CM

## 2013-06-25 DIAGNOSIS — I4891 Unspecified atrial fibrillation: Secondary | ICD-10-CM

## 2013-06-25 DIAGNOSIS — I82419 Acute embolism and thrombosis of unspecified femoral vein: Secondary | ICD-10-CM

## 2013-06-25 DIAGNOSIS — I824Y9 Acute embolism and thrombosis of unspecified deep veins of unspecified proximal lower extremity: Secondary | ICD-10-CM

## 2013-06-25 LAB — POCT INR: INR: 2.5

## 2013-06-25 NOTE — Telephone Encounter (Signed)
Ok, lab only

## 2013-06-25 NOTE — Telephone Encounter (Signed)
May have the lab check if se must come today. If not tomorrow is preferred.

## 2013-06-25 NOTE — Patient Instructions (Signed)
Continue to take  1 1/2 tablets all days except 1 tablet on M/W/F.  Re-check in 4 weeks.  Anticoagulation Dose Instructions as of 06/25/2013     Glynis SmilesSun Mon Tue Wed Thu Fri Sat   New Dose 3 mg 2 mg 3 mg 2 mg 3 mg 2 mg 3 mg    Description       Continue to take  1 1/2 tablets all days except 1 tablet on M/W/F.  Re-check in 4 weeks.

## 2013-06-25 NOTE — Telephone Encounter (Signed)
Pt really want to come in today for that coum check. Laney Potashana says ok too.  Do I schedule the type as coum or lab?

## 2013-06-25 NOTE — Telephone Encounter (Signed)
Pt would like to come in this afternoon about 1pm for a coum  ck. It's hard for pt to get out when its cold. pls advise if ok  to schedule.

## 2013-06-25 NOTE — Telephone Encounter (Signed)
Padonda and I will be out of the office. [facility] other [facility]

## 2013-06-25 NOTE — Telephone Encounter (Signed)
Relevant patient education mailed to patient.  

## 2013-06-25 NOTE — Telephone Encounter (Signed)
Done

## 2013-06-26 ENCOUNTER — Ambulatory Visit: Payer: Medicare Other

## 2013-07-01 ENCOUNTER — Other Ambulatory Visit: Payer: Self-pay | Admitting: Internal Medicine

## 2013-07-02 ENCOUNTER — Other Ambulatory Visit: Payer: Self-pay | Admitting: General Practice

## 2013-07-02 MED ORDER — WARFARIN SODIUM 2 MG PO TABS: ORAL_TABLET | ORAL | Status: DC

## 2013-07-18 ENCOUNTER — Other Ambulatory Visit: Payer: Self-pay | Admitting: Internal Medicine

## 2013-07-23 ENCOUNTER — Ambulatory Visit (INDEPENDENT_AMBULATORY_CARE_PROVIDER_SITE_OTHER): Payer: Medicare Other | Admitting: Family

## 2013-07-23 DIAGNOSIS — Z7901 Long term (current) use of anticoagulants: Secondary | ICD-10-CM

## 2013-07-23 DIAGNOSIS — I4891 Unspecified atrial fibrillation: Secondary | ICD-10-CM

## 2013-07-23 DIAGNOSIS — I824Y9 Acute embolism and thrombosis of unspecified deep veins of unspecified proximal lower extremity: Secondary | ICD-10-CM

## 2013-07-23 DIAGNOSIS — I82419 Acute embolism and thrombosis of unspecified femoral vein: Secondary | ICD-10-CM

## 2013-07-23 LAB — POCT INR: INR: 2.6

## 2013-07-23 NOTE — Patient Instructions (Signed)
Continue to take  1 1/2 tablets all days except 1 tablet on M/W/F.  Re-check in 6 weeks.  Anticoagulation Dose Instructions as of 07/23/2013     Glynis SmilesSun Mon Tue Wed Thu Fri Sat   New Dose 3 mg 2 mg 3 mg 2 mg 3 mg 2 mg 3 mg    Description       Continue to take  1 1/2 tablets all days except 1 tablet on M/W/F.  Re-check in 6 weeks.

## 2013-08-14 ENCOUNTER — Encounter: Payer: Self-pay | Admitting: Internal Medicine

## 2013-08-14 ENCOUNTER — Ambulatory Visit (INDEPENDENT_AMBULATORY_CARE_PROVIDER_SITE_OTHER): Payer: Medicare Other | Admitting: Internal Medicine

## 2013-08-14 VITALS — BP 110/60 | HR 91 | Temp 98.6°F | Resp 20 | Ht 65.5 in | Wt 162.0 lb

## 2013-08-14 DIAGNOSIS — I509 Heart failure, unspecified: Secondary | ICD-10-CM

## 2013-08-14 DIAGNOSIS — Z7901 Long term (current) use of anticoagulants: Secondary | ICD-10-CM

## 2013-08-14 DIAGNOSIS — E119 Type 2 diabetes mellitus without complications: Secondary | ICD-10-CM

## 2013-08-14 DIAGNOSIS — I10 Essential (primary) hypertension: Secondary | ICD-10-CM

## 2013-08-14 DIAGNOSIS — E1149 Type 2 diabetes mellitus with other diabetic neurological complication: Secondary | ICD-10-CM

## 2013-08-14 DIAGNOSIS — N289 Disorder of kidney and ureter, unspecified: Secondary | ICD-10-CM

## 2013-08-14 MED ORDER — GLIPIZIDE ER 2.5 MG PO TB24: 2.5000 mg | ORAL_TABLET | Freq: Every day | ORAL | Status: DC

## 2013-08-14 NOTE — Patient Instructions (Signed)
Limit your sodium (Salt) intake  Please see your eye doctor yearly to check for diabetic eye damage  Cardiology followup as scheduled   Please check your hemoglobin A1c every 3 months

## 2013-08-14 NOTE — Progress Notes (Signed)
Pre-visit discussion using our clinic review tool. No additional management support is needed unless otherwise documented below in the visit note.  

## 2013-08-14 NOTE — Progress Notes (Signed)
Subjective:    Patient ID: [name], female    DOB: [birth year], 80 y.o.   MRN: [id number]  HPI 80 year old patient who is seen today for followup.  Recently, she traumatized her right foot and has had some resolving swelling and ecchymoses. She has type 2 diabetes, and hemoglobin A1c has generally been less than 6.  She does describe some occasional hypoglycemia.  She has chronic kidney disease, and metformin therapy has been avoided.  She has been on glimepiride 2 mg daily She has a history of PAF and remains on chronic Coumadin anticoagulation.  She has hypertension and dyslipidemia.  She is scheduled for cardiology followup later this spring.  Past Medical History  Diagnosis Date  . Osteoporosis   . Hyperlipidemia   . Tremor, essential   . CHF (congestive heart failure)   . Type II or unspecified type diabetes mellitus with neurological manifestations, not stated as uncontrolled   . Hypertension   . Thyroid disease   . Diverticulosis of colon   . Atrial fibrillation   . Anxiety   . DM (diabetes mellitus)   . Pain in left ear   . Hypothyroidism   . Atrial fibrillation   . Constipation     History   Social History  . Marital Status: Unknown    Spouse Name: N/A    Number of Children: N/A  . Years of Education: N/A   Occupational History  . Not on file.   Social History Main Topics  . Smoking status: Never Smoker   . Smokeless tobacco: Never Used  . Alcohol Use: No  . Drug Use: No  . Sexual Activity: Not on file   Other Topics Concern  . Not on file   Social History Narrative  . No narrative on file    Past Surgical History  Procedure Laterality Date  . Cataract extraction  2002  . Pacemaker placement    . L3 compression fraction    . Transthoracic echocardiogram  2011, 2012    Family History  Problem Relation Age of Onset  . Heart attack Father   . Colon cancer Neg Hx     Allergies  Allergen Reactions  . Codeine Sulfate     REACTION:  unspecified  . Simvastatin     REACTION: choking, acid reflux    Current Outpatient Prescriptions on File Prior to Visit  Medication Sig Dispense Refill  . acetaminophen (TYLENOL) 650 MG CR tablet Take 650 mg by mouth every 8 (eight) hours as needed for pain.      . Blood Glucose Monitoring Suppl (ACCU-CHEK AVIVA PLUS) W/DEVICE KIT 1 each by Does not apply route daily as needed.  1 kit  0  . diazepam (VALIUM) 5 MG tablet take 1 tablet by mouth every 8 hours if needed for sleep and anxiety  30 tablet  2  . diltiazem (CARDIZEM CD) 300 MG 24 hr capsule take 1 capsule by mouth once daily  90 capsule  3  . diphenhydrAMINE (SOMINEX) 25 MG tablet Take 25 mg by mouth as needed for sleep.      . ferrous sulfate 325 (65 FE) MG tablet take 1 tablet by mouth once daily with BREAKFAST  30 tablet  3  . furosemide (LASIX) 20 MG tablet Take 1 tablet (20 mg total) by mouth daily.  90 tablet  1  . glimepiride (AMARYL) 2 MG tablet take 1 tablet once daily BEFORE BREAKFAST  90 tablet  3  . glucose blood (  ACCU-CHEK AVIVA PLUS) test strip 1 each by Other route daily as needed for other.  100 each  12  . hyoscyamine (LEVBID) 0.375 MG 12 hr tablet take 1 tablet by mouth twice a day if needed ONLY AS NEEDED FOR CRAMPING. DO NOT TAKE EVERYDAY. MUST LAST 30 DAYS  30 tablet  2  . KLOR-CON M10 10 MEQ tablet take 1 tablet by mouth once daily  30 tablet  5  . Lancets (ACCU-CHEK SOFT TOUCH) lancets 1 each by Other route daily as needed for other.  100 each  12  . levothyroxine (SYNTHROID, LEVOTHROID) 75 MCG tablet take 1 tablet by mouth once daily  90 tablet  1  . meclizine (ANTIVERT) 25 MG tablet Take 1 tablet (25 mg total) by mouth 3 (three) times daily as needed for dizziness.  30 tablet  4  . MICARDIS 80 MG tablet take 1 tablet by mouth once daily  90 tablet  3  . polyethylene glycol powder (GLYCOLAX/MIRALAX) powder take 17GM (DISSOLVED IN WATER) by mouth once daily  255 g  3  . simethicone (MYLICON) 125 MG chewable  tablet Chew 125 mg by mouth every 6 (six) hours as needed.        . warfarin (COUMADIN) 2 MG tablet Take as directed by anticoagulation clinic  120 tablet  0   No current facility-administered medications on file prior to visit.    BP 110/60  Pulse 91  Temp(Src) 98.6 F (37 C) (Oral)  Resp 20  Ht 5' 5.5" (1.664 m)  Wt 162 lb (73.483 kg)  BMI 26.54 kg/m2  SpO2 95%       Review of Systems  Constitutional: Negative.   HENT: Negative for congestion, dental problem, hearing loss, rhinorrhea, sinus pressure, sore throat and tinnitus.   Eyes: Negative for pain, discharge and visual disturbance.  Respiratory: Negative for cough and shortness of breath.   Cardiovascular: Negative for chest pain, palpitations and leg swelling.  Gastrointestinal: Negative for nausea, vomiting, abdominal pain, diarrhea, constipation, blood in stool and abdominal distention.  Genitourinary: Negative for dysuria, urgency, frequency, hematuria, flank pain, vaginal bleeding, vaginal discharge, difficulty urinating, vaginal pain and pelvic pain.  Musculoskeletal: Negative for arthralgias, gait problem and joint swelling.  Skin: Negative for rash.       Swelling and ecchymoses, right foot Absent right great toenail  Neurological: Negative for dizziness, syncope, speech difficulty, weakness, numbness and headaches.  Hematological: Negative for adenopathy.  Psychiatric/Behavioral: Negative for behavioral problems, dysphoric mood and agitation. The patient is not nervous/anxious.        Objective:   Physical Exam  Constitutional: She is oriented to person, place, and time. She appears well-developed and well-nourished.  HENT:  Head: Normocephalic.  Right Ear: External ear normal.  Left Ear: External ear normal.  Mouth/Throat: Oropharynx is clear and moist.  Eyes: Conjunctivae and EOM are normal. Pupils are equal, round, and reactive to light.  Neck: Normal range of motion. Neck supple. No thyromegaly  present.  Cardiovascular: Normal rate, regular rhythm, normal heart sounds and intact distal pulses.    Rhythm is regular  Pulmonary/Chest: Effort normal. She has rales.  Abdominal: Soft. Bowel sounds are normal. She exhibits no mass. There is no tenderness.  Musculoskeletal: Normal range of motion. She exhibits edema.  Ecchymoses and edema of the right foot and ankle.  Lymphadenopathy:    She has no cervical adenopathy.  Neurological: She is alert and oriented to person, place, and time.  Skin: Skin is warm  and dry. No rash noted.  Psychiatric: She has a normal mood and affect. Her behavior is normal.          Assessment & Plan:   Diabetes mellitus.  We'll discontinue glimepiride, and substitute low-dose glipizide.  She can go and A1c in 3 months Hypertension Dyslipidemia PAF.  Continue Coumadin anticoagulation

## 2013-08-21 ENCOUNTER — Telehealth: Payer: Self-pay | Admitting: Internal Medicine

## 2013-08-21 NOTE — Telephone Encounter (Signed)
New Message  Pt called states that her heart is out of rhythm she is so weak that she cannot stand.. Request to speak with a nurse//S

## 2013-08-21 NOTE — Telephone Encounter (Signed)
[name] called to schedule the patient an appointment with Dr Ladona Ridgelaylor and she had called [facility].  The medics were at her house and all vitals were stable.  In Oct she had 8 years left on her battery.  She is schedule for 09/02/13 with Dr Ladona Ridgelaylor

## 2013-08-21 NOTE — Telephone Encounter (Signed)
Pt called because she said that she is out of rhythm it started an hour ago. Pt is very weak and SOB  she is sitting down in a launch chair. Pt thinks that she needs a new Visual merchandiserace maker. Pt states she will call [facility] they will assess to see if she needs to go to the hospital and is free. With  911  she  Would need  pay the fee. This message will be send to MD and his nurse.

## 2013-08-24 ENCOUNTER — Other Ambulatory Visit: Payer: Self-pay | Admitting: Internal Medicine

## 2013-08-25 ENCOUNTER — Telehealth: Payer: Self-pay | Admitting: Internal Medicine

## 2013-08-25 NOTE — Telephone Encounter (Signed)
Daughter called with question regarding medications.  Left voice mail x 2 to call office for assistance.

## 2013-08-25 NOTE — Telephone Encounter (Signed)
Noted, pt has appointment tomorrow

## 2013-08-25 NOTE — Telephone Encounter (Signed)
Patient Information:  Caller Name: [name]  Phone: [phone]  Patient: [name], [name]  Gender: Female  DOB: [birth year]  Age: 80 Years  PCP: Eleonore ChiquitoKwiatkowski, Peter (Family Practice > 7353yrs old)  Office Follow Up:  Does the office need to follow up with this patient?: No  Instructions For The Office: N/A  RN Note:  Advised pt's granddaughter to have the pt take claritin and not benadyl and never at the same time unless prescribed by provider.  Symptoms  Reason For Call & Symptoms: Pt's granddaughter/[name] whats to know about taking Claritin and benadryl.   Also pt c/o of sleepliness and care take realized she was taking 2 benadryl a day.  Reviewed Health History In EMR: Yes  Reviewed Medications In EMR: Yes  Reviewed Allergies In EMR: Yes  Reviewed Surgeries / Procedures: Yes  Date of Onset of Symptoms: 08/23/2013  Guideline(s) Used:  No Protocol Available - Information Only  Disposition Per Guideline:   Home Care  Reason For Disposition Reached:   Information only question and nurse able to answer  Advice Given:  Call Back If:  New symptoms develop  You become worse.  Patient Will Follow Care Advice:  YES

## 2013-08-26 ENCOUNTER — Other Ambulatory Visit: Payer: Self-pay | Admitting: Internal Medicine

## 2013-08-27 ENCOUNTER — Encounter: Payer: Medicare Other | Admitting: Internal Medicine

## 2013-09-02 ENCOUNTER — Ambulatory Visit (INDEPENDENT_AMBULATORY_CARE_PROVIDER_SITE_OTHER): Payer: Medicare Other | Admitting: Internal Medicine

## 2013-09-02 ENCOUNTER — Encounter: Payer: Self-pay | Admitting: Internal Medicine

## 2013-09-02 VITALS — BP 129/72 | HR 75 | Ht 65.0 in | Wt 157.4 lb

## 2013-09-02 DIAGNOSIS — I4891 Unspecified atrial fibrillation: Secondary | ICD-10-CM

## 2013-09-02 DIAGNOSIS — Z950 Presence of cardiac pacemaker: Secondary | ICD-10-CM

## 2013-09-02 DIAGNOSIS — I10 Essential (primary) hypertension: Secondary | ICD-10-CM

## 2013-09-02 LAB — MDC_IDC_ENUM_SESS_TYPE_INCLINIC
Battery Voltage: 2.79 V
Date Time Interrogation Session: 20150414173432
Lead Channel Impedance Value: 67 Ohm
Lead Channel Pacing Threshold Amplitude: 0.5 V
Lead Channel Pacing Threshold Pulse Width: 0.4 ms
MDC IDC MSMT BATTERY IMPEDANCE: 342 Ohm
MDC IDC MSMT BATTERY REMAINING LONGEVITY: 92 mo
MDC IDC MSMT LEADCHNL RV IMPEDANCE VALUE: 645 Ohm
MDC IDC MSMT LEADCHNL RV SENSING INTR AMPL: 11.2 mV
MDC IDC SET LEADCHNL RV PACING AMPLITUDE: 2.5 V
MDC IDC SET LEADCHNL RV PACING PULSEWIDTH: 0.4 ms
MDC IDC SET LEADCHNL RV SENSING SENSITIVITY: 1.4 mV
MDC IDC STAT BRADY RV PERCENT PACED: 100 %

## 2013-09-02 NOTE — Assessment & Plan Note (Signed)
Her Medtronic DDD PM , programmed VVIR is working normally. Will recheck in several months.

## 2013-09-02 NOTE — Assessment & Plan Note (Signed)
Her ventricular rate is well controlled. She will continue her current meds. 

## 2013-09-02 NOTE — Assessment & Plan Note (Signed)
Her blood pressure is well controlled. No change in meds. She is encouraged to maintain a low sodium diet. 

## 2013-09-02 NOTE — Progress Notes (Addendum)
HPI [name] returns today for followup. She is a very pleasant 80 year old woman with chronic atrial fibrillation, status post permanent pacemaker insertion, status post AV nodal ablation, with a history of diastolic heart failure. In the interim, she has been stable but has been bothered by chronic back pain. She has difficulty walking because it hurts her back. She denies syncope. She has fairly mild peripheral edema. No chest pain or hemoptysis. Allergies  Allergen Reactions  . Codeine Sulfate     REACTION: unspecified  . Simvastatin     REACTION: choking, acid reflux     Current Outpatient Prescriptions  Medication Sig Dispense Refill  . acetaminophen (TYLENOL) 650 MG CR tablet Take 650 mg by mouth every 8 (eight) hours as needed for pain.      . diazepam (VALIUM) 5 MG tablet take 1 tablet by mouth every 8 hours if needed for sleep and anxiety  30 tablet  2  . diltiazem (CARDIZEM CD) 300 MG 24 hr capsule take 1 capsule by mouth once daily  90 capsule  3  . diphenhydrAMINE (SOMINEX) 25 MG tablet Take 25 mg by mouth as needed for sleep.      . ferrous sulfate 325 (65 FE) MG tablet take 1 tablet by mouth once daily with BREAKFAST  30 tablet  5  . furosemide (LASIX) 20 MG tablet Take 1 tablet (20 mg total) by mouth daily.  90 tablet  1  . glipiZIDE (GLUCOTROL XL) 2.5 MG 24 hr tablet Take 1 tablet (2.5 mg total) by mouth daily with breakfast.  90 tablet  4  . hyoscyamine (LEVBID) 0.375 MG 12 hr tablet take 1 tablet by mouth twice a day if needed ONLY AS NEEDED FOR CRAMPING. DO NOT TAKE EVERYDAY. MUST LAST 30 DAYS  30 tablet  2  . levothyroxine (SYNTHROID, LEVOTHROID) 75 MCG tablet take 1 tablet by mouth once daily  90 tablet  1  . meclizine (ANTIVERT) 25 MG tablet Take 1 tablet (25 mg total) by mouth 3 (three) times daily as needed for dizziness.  30 tablet  4  . MICARDIS 80 MG tablet take 1 tablet by mouth once daily  90 tablet  3  . polyethylene glycol powder (GLYCOLAX/MIRALAX) powder  take 17GM (DISSOLVED IN WATER) by mouth once daily  255 g  3  . potassium chloride (K-DUR,KLOR-CON) 10 MEQ tablet take 1 tablet by mouth once daily  30 tablet  5  . simethicone (MYLICON) 125 MG chewable tablet Chew 125 mg by mouth every 6 (six) hours as needed.        . warfarin (COUMADIN) 2 MG tablet Take as directed by anticoagulation clinic  120 tablet  0   No current facility-administered medications for this visit.     Past Medical History  Diagnosis Date  . Osteoporosis   . Hyperlipidemia   . Tremor, essential   . CHF (congestive heart failure)   . Type II or unspecified type diabetes mellitus with neurological manifestations, not stated as uncontrolled   . Hypertension   . Thyroid disease   . Diverticulosis of colon   . Atrial fibrillation   . Anxiety   . DM (diabetes mellitus)   . Pain in left ear   . Hypothyroidism   . Atrial fibrillation   . Constipation     ROS:   All systems reviewed and negative except as noted in the HPI.   Past Surgical History  Procedure Laterality Date  . Cataract extraction  2002  .  Pacemaker placement    . L3 compression fraction    . Transthoracic echocardiogram  2011, 2012     Family History  Problem Relation Age of Onset  . Heart attack Father   . Colon cancer Neg Hx      History   Social History  . Marital Status: Unknown    Spouse Name: N/A    Number of Children: N/A  . Years of Education: N/A   Occupational History  . Not on file.   Social History Main Topics  . Smoking status: Never Smoker   . Smokeless tobacco: Never Used  . Alcohol Use: No  . Drug Use: No  . Sexual Activity: Not on file   Other Topics Concern  . Not on file   Social History Narrative  . No narrative on file     BP 129/72  Pulse 75  Ht 5\' 5"  (1.651 m)  Wt 157 lb 6.4 oz (71.396 kg)  BMI 26.19 kg/m2  Physical Exam:  Well appearing elderly woman,NAD HEENT: Unremarkable Neck:  7 cm JVD, no thyromegally Back:  No CVA  tenderness, mild kyphosis Lungs:  Clear with no wheezes, rales, or rhonchi. HEART:  Regular rate rhythm, no murmurs, no rubs, no clicks Abd:  soft, positive bowel sounds, no organomegally, no rebound, no guarding Ext:  2 plus pulses, no edema, no cyanosis, no clubbing Skin:  No rashes no nodules Neuro:  CN II through XII intact, motor grossly intact  ECG - atrial fibrillation with ventricular pacing at 70/min.  DEVICE  Normal device function.  See PaceArt for details.   Assess/Plan:

## 2013-09-02 NOTE — Patient Instructions (Signed)
Your physician wants you to follow-up in: 6 months with device clinic and 12 months with Dr Taylor You will receive a reminder letter in the mail two months in advance. If you don't receive a letter, please call our office to schedule the follow-up appointment.  

## 2013-09-03 ENCOUNTER — Telehealth: Payer: Self-pay | Admitting: Internal Medicine

## 2013-09-03 NOTE — Telephone Encounter (Signed)
Pt saw dr taylor cardiologist on 09-02-13 and has a fungus on toenail. Per granddaughter dr Ladona Ridgeltaylor said she would needs ointment instead of pills [facility] into [facility].  Dr Kirtland Bouchardk saw pt on 08-14-13.

## 2013-09-04 ENCOUNTER — Ambulatory Visit: Payer: Medicare Other

## 2013-09-04 ENCOUNTER — Ambulatory Visit (INDEPENDENT_AMBULATORY_CARE_PROVIDER_SITE_OTHER): Payer: Medicare Other | Admitting: General Practice

## 2013-09-04 DIAGNOSIS — I824Y9 Acute embolism and thrombosis of unspecified deep veins of unspecified proximal lower extremity: Secondary | ICD-10-CM

## 2013-09-04 DIAGNOSIS — I82419 Acute embolism and thrombosis of unspecified femoral vein: Secondary | ICD-10-CM

## 2013-09-04 DIAGNOSIS — I4891 Unspecified atrial fibrillation: Secondary | ICD-10-CM

## 2013-09-04 DIAGNOSIS — Z5181 Encounter for therapeutic drug level monitoring: Secondary | ICD-10-CM | POA: Insufficient documentation

## 2013-09-04 LAB — POCT INR: INR: 3.7

## 2013-09-04 MED ORDER — CICLOPIROX 8 % EX SOLN: Freq: Every day | CUTANEOUS | Status: DC

## 2013-09-04 NOTE — Telephone Encounter (Signed)
Left detailed message Rx sent to pharmacy. 

## 2013-09-04 NOTE — Progress Notes (Signed)
Pre visit review using our clinic review tool, if applicable. No additional management support is needed unless otherwise documented below in the visit note. 

## 2013-09-04 NOTE — Telephone Encounter (Signed)
Please advise 

## 2013-09-04 NOTE — Telephone Encounter (Signed)
Ciclopirox Apply to toenails daily

## 2013-09-08 ENCOUNTER — Telehealth: Payer: Self-pay | Admitting: Internal Medicine

## 2013-09-08 NOTE — Telephone Encounter (Signed)
Pharmacy is needing clarification on what is the correct rx to fill for the pt. States pt was confused on which one to take as well. glipiZIDE (GLUCOTROL XL) 2.5 MG 24 hr tablet and glimepirde 2 and 4 mg, pharmacy not sure which one to fill.

## 2013-09-08 NOTE — Telephone Encounter (Signed)
Called pharmacy and spoke to Korearista told her pt is suppose to take Glipizide 2.5 mg, the Rx for Glimepiride was discontinued. Lawrence Marseillesrista verbalized understanding and stated she will let pt know. Told her okay.

## 2013-10-01 NOTE — Telephone Encounter (Signed)
Entered in error

## 2013-10-02 ENCOUNTER — Ambulatory Visit (INDEPENDENT_AMBULATORY_CARE_PROVIDER_SITE_OTHER): Payer: Medicare Other | Admitting: Family

## 2013-10-02 ENCOUNTER — Ambulatory Visit: Payer: Medicare Other

## 2013-10-02 DIAGNOSIS — I4891 Unspecified atrial fibrillation: Secondary | ICD-10-CM

## 2013-10-02 DIAGNOSIS — I82419 Acute embolism and thrombosis of unspecified femoral vein: Secondary | ICD-10-CM

## 2013-10-02 DIAGNOSIS — I824Y9 Acute embolism and thrombosis of unspecified deep veins of unspecified proximal lower extremity: Secondary | ICD-10-CM

## 2013-10-02 DIAGNOSIS — Z5181 Encounter for therapeutic drug level monitoring: Secondary | ICD-10-CM

## 2013-10-02 LAB — POCT INR: INR: 2.1

## 2013-10-02 NOTE — Patient Instructions (Signed)
Continue to take  1 1/2 tablets all days except 1 tablet on M/W/F.  Re-check in 4 weeks.  Anticoagulation Dose Instructions as of 10/02/2013     Glynis SmilesSun Mon Tue Wed Thu Fri Sat   New Dose 3 mg 2 mg 3 mg 2 mg 3 mg 2 mg 3 mg    Description       Continue to take  1 1/2 tablets all days except 1 tablet on M/W/F.  Re-check in 4 weeks.

## 2013-10-14 ENCOUNTER — Other Ambulatory Visit: Payer: Self-pay | Admitting: Internal Medicine

## 2013-10-16 ENCOUNTER — Other Ambulatory Visit: Payer: Self-pay | Admitting: Internal Medicine

## 2013-10-20 ENCOUNTER — Other Ambulatory Visit: Payer: Self-pay | Admitting: Internal Medicine

## 2013-10-20 NOTE — Telephone Encounter (Signed)
Pt needs re-fill on potassium chloride (K-DUR,KLOR-CON) 10 MEQ tablet and warfarin (COUMADIN) 2 MG tablet sent to [facility].  She is completely out of warfarin (COUMADIN) 2 MG tablet

## 2013-10-21 ENCOUNTER — Other Ambulatory Visit: Payer: Self-pay | Admitting: General Practice

## 2013-10-21 MED ORDER — WARFARIN SODIUM 2 MG PO TABS: ORAL_TABLET | ORAL | Status: DC

## 2013-10-30 ENCOUNTER — Ambulatory Visit (INDEPENDENT_AMBULATORY_CARE_PROVIDER_SITE_OTHER): Payer: Medicare Other | Admitting: General Practice

## 2013-10-30 DIAGNOSIS — I824Y9 Acute embolism and thrombosis of unspecified deep veins of unspecified proximal lower extremity: Secondary | ICD-10-CM

## 2013-10-30 DIAGNOSIS — I4891 Unspecified atrial fibrillation: Secondary | ICD-10-CM

## 2013-10-30 DIAGNOSIS — I82419 Acute embolism and thrombosis of unspecified femoral vein: Secondary | ICD-10-CM

## 2013-10-30 DIAGNOSIS — Z5181 Encounter for therapeutic drug level monitoring: Secondary | ICD-10-CM

## 2013-10-30 LAB — POCT INR: INR: 2

## 2013-10-30 NOTE — Progress Notes (Signed)
Pre visit review using our clinic review tool, if applicable. No additional management support is needed unless otherwise documented below in the visit note. 

## 2013-11-06 ENCOUNTER — Encounter: Payer: Self-pay | Admitting: Internal Medicine

## 2013-11-06 ENCOUNTER — Ambulatory Visit (INDEPENDENT_AMBULATORY_CARE_PROVIDER_SITE_OTHER): Payer: Medicare Other | Admitting: Internal Medicine

## 2013-11-06 VITALS — BP 130/68 | HR 79 | Temp 98.4°F | Resp 20 | Ht 65.0 in | Wt 162.0 lb

## 2013-11-06 DIAGNOSIS — Z7901 Long term (current) use of anticoagulants: Secondary | ICD-10-CM

## 2013-11-06 DIAGNOSIS — S6710XA Crushing injury of unspecified finger(s), initial encounter: Secondary | ICD-10-CM

## 2013-11-06 NOTE — Patient Instructions (Signed)
Keep wound clean and dry  Call or return to clinic prn if these symptoms worsen or fail to improve as anticipated. Nail Bed Laceration Nail bed lacerations are cuts or breaks in the soft tissue under a fingernail or toenail. These injuries are usually painful and cause bleeding. They often involve damage to the nail or loss of the nail. If the nail remains in place, a nail bed laceration may result in a painful collection of blood under the nail (hematoma).  CAUSES Nail bed lacerations are commonly caused by:   Crush or pinch injuries in which the finger or toe gets caught between two objects.  A sharp cut to the fingertip or toe.  Tearing injuries (avulsions) to the tip of the finger or toe. DIAGNOSIS Your health care Rodrigues Urbanek will take a medical history, ask for details about how the injury occurred, and examine the injured area. The wound will be cleaned so that the health care Chavon Lucarelli can see the extent of the injury. X-rays are sometimes done to check for broken bones. TREATMENT Treatment will depend on the severity of the laceration and the details of the injury. Most lacerations require repair with stitches (sutures). For this repair, your health care Tanishia Lemaster may:  Give you medicine to numb the injured area (local anesthetic). In some cases, the health care Obelia Bonello may also apply a compression bandage (tourniquet) to reduce bleeding.  Remove the nail from the nail bed.  Close the wound in the nail bed with sutures that dissolve on their own (absorbable sutures).  Reattach the nail (if intact) after repairing the nail bed. The nail may be held in place with sutures or skin glue. Sometimes, a small hole is made in the nail to allow for normal drainage of fluid or blood. If the nail is lost or too damaged, a small piece of silicone or special gauze may be put over the nail bed.  A bandage (dressing) may be applied over the wound. A splint is sometimes used for increased  protection. You may need a tetanus shot if:  You cannot remember when you had your last tetanus shot.   You have never had a tetanus shot.   The injury broke your skin. If you get a tetanus shot, your arm may swell, get red, and feel warm to the touch. This is common and not a problem. If you need a tetanus shot and you choose not to have one, there is a rare chance of getting tetanus. Sickness from tetanus can be serious. HOME CARE INSTRUCTIONS  Keep the wound clean and dry. Change or remove dressings only as directed by your health care Maddy Graham. If the nail was preserved, you may be asked not to change the original dressing for 5 to 7 days. If the nail was lost, you may need to change dressings much sooner.   Gently clean the wound with soap and water before putting on a new dressing.   Apply antibiotic ointment to the wound if advised to do so by your health care Afreen Siebels.   Move your hand or foot normally to prevent stiffness. Your health care Andree Heeg may give you specific instructions.   Only take over-the-counter or prescription medicines as directed by your health care Athene Schuhmacher. If you were prescribed antibiotics, take them as directed. Finish them even if you start to feel better.  Follow up with your health care Shareese Macha as directed. SEEK MEDICAL CARE IF:  You have redness, swelling, or increasing pain in the injured  area.   You notice fluid draining from the injured site.  SEEK IMMEDIATE MEDICAL CARE IF:  Your wound splits open and starts bleeding.   Your skin around the injury is turning dark blue or black.  MAKE SURE YOU:  Understand these instructions.   Will watch your condition.   Will get help right away if you are not doing well or get worse.  Document Released: 08/15/2007 Document Revised: 01/08/2013 Document Reviewed: 10/21/2012 [facility] Patient Information 2015 [place], [place]. This information is not intended to replace advice given to you  by your health care Berish Bohman. Make sure you discuss any questions you have with your health care Derhonda Eastlick.

## 2013-11-06 NOTE — Progress Notes (Signed)
Subjective:    Patient ID: [name], female    DOB: Apr 06, 1927, 80 y.o.   MRN: [id number]  HPI  80 year old patient seen today as a work in after traumatizing her left index finger.  She slammed her left hand on a car door just prior to arrival.  Due to bleeding and being on Coumadin anticoagulation came by the office for evaluation  Past Medical History  Diagnosis Date  . Osteoporosis   . Hyperlipidemia   . Tremor, essential   . CHF (congestive heart failure)   . Type II or unspecified type diabetes mellitus with neurological manifestations, not stated as uncontrolled   . Hypertension   . Thyroid disease   . Diverticulosis of colon   . Atrial fibrillation   . Anxiety   . DM (diabetes mellitus)   . Pain in left ear   . Hypothyroidism   . Atrial fibrillation   . Constipation     History   Social History  . Marital Status: Unknown    Spouse Name: N/A    Number of Children: N/A  . Years of Education: N/A   Occupational History  . Not on file.   Social History Main Topics  . Smoking status: Never Smoker   . Smokeless tobacco: Never Used  . Alcohol Use: No  . Drug Use: No  . Sexual Activity: Not on file   Other Topics Concern  . Not on file   Social History Narrative  . No narrative on file    Past Surgical History  Procedure Laterality Date  . Cataract extraction  2002  . Pacemaker placement    . L3 compression fraction    . Transthoracic echocardiogram  2011, 2012    Family History  Problem Relation Age of Onset  . Heart attack Father   . Colon cancer Neg Hx     Allergies  Allergen Reactions  . Codeine Sulfate     REACTION: unspecified  . Simvastatin     REACTION: choking, acid reflux    Current Outpatient Prescriptions on File Prior to Visit  Medication Sig Dispense Refill  . acetaminophen (TYLENOL) 650 MG CR tablet Take 650 mg by mouth every 8 (eight) hours as needed for pain.      . ciclopirox (PENLAC) 8 % solution Apply topically  at bedtime. To toenails  6.6 mL  1  . diazepam (VALIUM) 5 MG tablet take 1 tablet by mouth every 8 hours if needed for sleep and anxiety  30 tablet  2  . diltiazem (CARDIZEM CD) 300 MG 24 hr capsule take 1 capsule by mouth once daily  90 capsule  1  . diphenhydrAMINE (SOMINEX) 25 MG tablet Take 25 mg by mouth as needed for sleep.      . ferrous sulfate 325 (65 FE) MG tablet take 1 tablet by mouth once daily with BREAKFAST  30 tablet  5  . furosemide (LASIX) 20 MG tablet Take 1 tablet (20 mg total) by mouth daily.  90 tablet  1  . glipiZIDE (GLUCOTROL XL) 2.5 MG 24 hr tablet Take 1 tablet (2.5 mg total) by mouth daily with breakfast.  90 tablet  4  . hyoscyamine (LEVBID) 0.375 MG 12 hr tablet take 1 tablet by mouth twice a day if needed ONLY AS NEEDED FOR CRAMPING   **DO NOT TAKE EVERY DAY - THIS MUST LAST 30 DAYS**  30 tablet  2  . levothyroxine (SYNTHROID, LEVOTHROID) 75 MCG tablet take 1 tablet by  mouth once daily  90 tablet  1  . meclizine (ANTIVERT) 25 MG tablet Take 1 tablet (25 mg total) by mouth 3 (three) times daily as needed for dizziness.  30 tablet  4  . MICARDIS 80 MG tablet take 1 tablet by mouth once daily  90 tablet  3  . polyethylene glycol powder (GLYCOLAX/MIRALAX) powder take 17GM (DISSOLVED IN WATER) by mouth once daily  255 g  3  . potassium chloride (K-DUR,KLOR-CON) 10 MEQ tablet take 1 tablet by mouth once daily  30 tablet  5  . simethicone (MYLICON) 125 MG chewable tablet Chew 125 mg by mouth every 6 (six) hours as needed.        . warfarin (COUMADIN) 2 MG tablet Take as directed by anticoagulation clinic  120 tablet  1   No current facility-administered medications on file prior to visit.    BP 130/68  Pulse 79  Temp(Src) 98.4 F (36.9 C) (Oral)  Resp 20  Ht 5\' 5"  (1.651 m)  Wt 162 lb (73.483 kg)  BMI 26.96 kg/m2  SpO2 95%       Review of Systems  Skin: Positive for wound.       Objective:   Physical Exam  Constitutional: She appears well-developed and  well-nourished. No distress.  Skin:  Mild crush injury, left distal left index finger with small hematoma beneath the nail bed.  No active bleeding at this time          Assessment & Plan:   Mild crush injury left distal index finger.  Wound care discussed.  The wound was cleaned and a antibiotic ointment applied and dressed

## 2013-11-07 ENCOUNTER — Other Ambulatory Visit: Payer: Self-pay | Admitting: Internal Medicine

## 2013-11-17 ENCOUNTER — Other Ambulatory Visit: Payer: Self-pay | Admitting: Internal Medicine

## 2013-11-18 ENCOUNTER — Telehealth: Payer: Self-pay | Admitting: Internal Medicine

## 2013-11-18 NOTE — Telephone Encounter (Signed)
Called pharmacy back and spoke to Korearista went over all of pt's medications that she is suppose to be taking at present. Lawrence Marseillesrista said that is correct according to there file also and she will go over with pt and make sure she is taking everything. Told her okay, thanks.

## 2013-11-18 NOTE — Telephone Encounter (Signed)
Pharm is calling because pt is confuse about her medication. Please call [facility] [phone]

## 2013-11-25 ENCOUNTER — Ambulatory Visit (INDEPENDENT_AMBULATORY_CARE_PROVIDER_SITE_OTHER): Payer: Medicare Other | Admitting: Internal Medicine

## 2013-11-25 ENCOUNTER — Ambulatory Visit (INDEPENDENT_AMBULATORY_CARE_PROVIDER_SITE_OTHER): Payer: Medicare Other | Admitting: General Practice

## 2013-11-25 ENCOUNTER — Telehealth: Payer: Self-pay | Admitting: Internal Medicine

## 2013-11-25 ENCOUNTER — Telehealth: Payer: Self-pay | Admitting: General Practice

## 2013-11-25 ENCOUNTER — Encounter: Payer: Self-pay | Admitting: Internal Medicine

## 2013-11-25 VITALS — BP 140/74 | HR 76 | Temp 98.3°F | Resp 20 | Ht 65.0 in | Wt 161.0 lb

## 2013-11-25 DIAGNOSIS — H6120 Impacted cerumen, unspecified ear: Secondary | ICD-10-CM

## 2013-11-25 DIAGNOSIS — H6121 Impacted cerumen, right ear: Secondary | ICD-10-CM

## 2013-11-25 DIAGNOSIS — E119 Type 2 diabetes mellitus without complications: Secondary | ICD-10-CM

## 2013-11-25 DIAGNOSIS — I824Y9 Acute embolism and thrombosis of unspecified deep veins of unspecified proximal lower extremity: Secondary | ICD-10-CM

## 2013-11-25 DIAGNOSIS — Z5181 Encounter for therapeutic drug level monitoring: Secondary | ICD-10-CM

## 2013-11-25 DIAGNOSIS — I10 Essential (primary) hypertension: Secondary | ICD-10-CM

## 2013-11-25 DIAGNOSIS — I82419 Acute embolism and thrombosis of unspecified femoral vein: Secondary | ICD-10-CM

## 2013-11-25 LAB — POCT INR: INR: 2.2

## 2013-11-25 LAB — HEMOGLOBIN A1C: Hgb A1c MFr Bld: 6.2 % (ref 4.6–6.5)

## 2013-11-25 NOTE — Telephone Encounter (Signed)
Attempted to contact patient to schedule follow up INR.  Unable to leave message.  Will try again later.

## 2013-11-25 NOTE — Progress Notes (Signed)
Pre visit review using our clinic review tool, if applicable. No additional management support is needed unless otherwise documented below in the visit note. 

## 2013-11-25 NOTE — Telephone Encounter (Signed)
Relevant patient education mailed to patient.  

## 2013-11-25 NOTE — Progress Notes (Signed)
Subjective:    Patient ID: [name], female    DOB: 1926-09-24, 80 y.o.   MRN: [id number]  HPI  80 year old patient, who presents today with a chief complaint of hair loss from the left ear.  She has chronic atrial fibrillation, type 2 diabetes, and hypertension.  Last hemoglobin A1c was 7 months ago and was 5.6.  Past Medical History  Diagnosis Date  . Osteoporosis   . Hyperlipidemia   . Tremor, essential   . CHF (congestive heart failure)   . Type II or unspecified type diabetes mellitus with neurological manifestations, not stated as uncontrolled   . Hypertension   . Thyroid disease   . Diverticulosis of colon   . Atrial fibrillation   . Anxiety   . DM (diabetes mellitus)   . Pain in left ear   . Hypothyroidism   . Atrial fibrillation   . Constipation     History   Social History  . Marital Status: Unknown    Spouse Name: N/A    Number of Children: N/A  . Years of Education: N/A   Occupational History  . Not on file.   Social History Main Topics  . Smoking status: Never Smoker   . Smokeless tobacco: Never Used  . Alcohol Use: No  . Drug Use: No  . Sexual Activity: Not on file   Other Topics Concern  . Not on file   Social History Narrative  . No narrative on file    Past Surgical History  Procedure Laterality Date  . Cataract extraction  2002  . Pacemaker placement    . L3 compression fraction    . Transthoracic echocardiogram  2011, 2012    Family History  Problem Relation Age of Onset  . Heart attack Father   . Colon cancer Neg Hx     Allergies  Allergen Reactions  . Codeine Sulfate     REACTION: unspecified  . Simvastatin     REACTION: choking, acid reflux    Current Outpatient Prescriptions on File Prior to Visit  Medication Sig Dispense Refill  . acetaminophen (TYLENOL) 650 MG CR tablet Take 650 mg by mouth every 8 (eight) hours as needed for pain.      . ciclopirox (PENLAC) 8 % solution Apply topically at bedtime. To  toenails  6.6 mL  1  . diazepam (VALIUM) 5 MG tablet take 1 tablet by mouth every 8 hours if needed for sleep and anxiety  30 tablet  2  . diltiazem (CARDIZEM CD) 300 MG 24 hr capsule take 1 capsule by mouth once daily  90 capsule  1  . ferrous sulfate 325 (65 FE) MG tablet take 1 tablet by mouth once daily with BREAKFAST  30 tablet  5  . furosemide (LASIX) 20 MG tablet Take 1 tablet (20 mg total) by mouth daily.  90 tablet  1  . glipiZIDE (GLUCOTROL XL) 2.5 MG 24 hr tablet Take 1 tablet (2.5 mg total) by mouth daily with breakfast.  90 tablet  4  . hyoscyamine (LEVBID) 0.375 MG 12 hr tablet take 1 tablet by mouth twice a day if needed ONLY AS NEEDED FOR CRAMPING   **DO NOT TAKE EVERY DAY - THIS MUST LAST 30 DAYS**  30 tablet  2  . levothyroxine (SYNTHROID, LEVOTHROID) 75 MCG tablet take 1 tablet by mouth once daily  90 tablet  1  . meclizine (ANTIVERT) 25 MG tablet Take 1 tablet (25 mg total) by mouth 3 (three)  times daily as needed for dizziness.  30 tablet  4  . polyethylene glycol powder (GLYCOLAX/MIRALAX) powder take 17GM (DISSOLVED IN WATER) by mouth once daily  255 g  3  . potassium chloride (K-DUR,KLOR-CON) 10 MEQ tablet take 1 tablet by mouth once daily  30 tablet  5  . simethicone (MYLICON) 125 MG chewable tablet Chew 125 mg by mouth every 6 (six) hours as needed.        Marland Kitchen. telmisartan (MICARDIS) 80 MG tablet take 1 tablet by mouth once daily  90 tablet  1  . warfarin (COUMADIN) 2 MG tablet Take as directed by anticoagulation clinic  120 tablet  1   No current facility-administered medications on file prior to visit.    BP 140/74  Pulse 76  Temp(Src) 98.3 F (36.8 C) (Oral)  Resp 20  Ht 5\' 5"  (1.651 m)  Wt 161 lb (73.029 kg)  BMI 26.79 kg/m2  SpO2 96%     Review of Systems  Constitutional: Negative.   HENT: Positive for hearing loss. Negative for congestion, dental problem, rhinorrhea, sinus pressure, sore throat and tinnitus.   Eyes: Negative for pain, discharge and visual  disturbance.  Respiratory: Negative for cough and shortness of breath.   Cardiovascular: Negative for chest pain, palpitations and leg swelling.  Gastrointestinal: Negative for nausea, vomiting, abdominal pain, diarrhea, constipation, blood in stool and abdominal distention.  Genitourinary: Negative for dysuria, urgency, frequency, hematuria, flank pain, vaginal bleeding, vaginal discharge, difficulty urinating, vaginal pain and pelvic pain.  Musculoskeletal: Negative for arthralgias, gait problem and joint swelling.  Skin: Negative for rash.  Neurological: Negative for dizziness, syncope, speech difficulty, weakness, numbness and headaches.  Hematological: Negative for adenopathy.  Psychiatric/Behavioral: Negative for behavioral problems, dysphoric mood and agitation. The patient is not nervous/anxious.        Objective:   Physical Exam  Constitutional: She appears well-developed and well-nourished.  HENT:  The left tympanic membrane and canal were normal Cerumen present in the right canal  Weber does lateralize to the right          Assessment & Plan:   Hearing loss, left ear Right cerumen impaction.  Will irrigate until clear Diabetes.  We'll check a hemoglobin A1c.  Hypertension, stable Chronic atrial fibrillation

## 2013-11-25 NOTE — Patient Instructions (Signed)
Limit your sodium (Salt) intake  Return in 4 months for follow-up  

## 2013-11-27 ENCOUNTER — Ambulatory Visit: Payer: Medicare Other

## 2013-12-02 ENCOUNTER — Telehealth: Payer: Self-pay | Admitting: Internal Medicine

## 2013-12-02 NOTE — Telephone Encounter (Signed)
Left message on voicemail to call office.  

## 2013-12-02 NOTE — Telephone Encounter (Signed)
Per grand daughter angela is calling to confirm her grand mother is no longer taking coumadin

## 2013-12-02 NOTE — Telephone Encounter (Signed)
[name] called back, told her Lodema PilotCynthia Boyd tried to contact her but was unable to leave a message. According to her notes from INR result it said seh is suppose to Continue to take 1 tablet all days except 1 1/2 tablets on Monday/Fridays. Re-check in 4 weeks. [name] verbalized understanding.

## 2013-12-10 ENCOUNTER — Telehealth: Payer: Self-pay | Admitting: Internal Medicine

## 2013-12-10 MED ORDER — METRONIDAZOLE 500 MG PO TABS: 500.0000 mg | ORAL_TABLET | Freq: Three times a day (TID) | ORAL | Status: DC

## 2013-12-10 MED ORDER — CIPROFLOXACIN HCL 500 MG PO TABS: 500.0000 mg | ORAL_TABLET | Freq: Two times a day (BID) | ORAL | Status: DC

## 2013-12-10 NOTE — Telephone Encounter (Signed)
Pt states she is having a bout of diverticulitis. Pt states she has no one that can bring her. Pt states she has had this a long time and knows she needs antibiotic. Pt has had diverticulitis for over 25 yrs.  [facility]

## 2013-12-10 NOTE — Telephone Encounter (Signed)
Metronidazole 500 mg, #21, one 3 times a day Generic Cipro 500, #14 one twice a day

## 2013-12-10 NOTE — Telephone Encounter (Signed)
Spoke to pt told her 2 antibiotic Rx's were sent to pharmacy for her follow the directions on the bottles. Pt verbalized understanding.

## 2013-12-10 NOTE — Telephone Encounter (Signed)
Please advise 

## 2014-01-02 ENCOUNTER — Ambulatory Visit (INDEPENDENT_AMBULATORY_CARE_PROVIDER_SITE_OTHER): Payer: Medicare Other | Admitting: Family

## 2014-01-02 DIAGNOSIS — I4891 Unspecified atrial fibrillation: Secondary | ICD-10-CM

## 2014-01-02 DIAGNOSIS — I824Y9 Acute embolism and thrombosis of unspecified deep veins of unspecified proximal lower extremity: Secondary | ICD-10-CM

## 2014-01-02 DIAGNOSIS — Z5181 Encounter for therapeutic drug level monitoring: Secondary | ICD-10-CM

## 2014-01-02 DIAGNOSIS — I82419 Acute embolism and thrombosis of unspecified femoral vein: Secondary | ICD-10-CM

## 2014-01-02 LAB — POCT INR: INR: 1.9

## 2014-01-02 NOTE — Patient Instructions (Signed)
Take an extra 1/2 tablet. Then continue to take 1 tablet all days except 1 1/2 tablets on Monday/Fridays.  Re-check in 4 weeks.  Anticoagulation Dose Instructions as of 01/02/2014     Maria SmilesSun Mon Tue Wed Thu Fri Sat   New Dose 2 mg 3 mg 2 mg 2 mg 2 mg 3 mg 2 mg    Description       Take an extra 1/2 tablet. Then continue to take 1 tablet all days except 1 1/2 tablets on Monday/Fridays.  Re-check in 4 weeks.

## 2014-01-13 ENCOUNTER — Other Ambulatory Visit: Payer: Self-pay | Admitting: Internal Medicine

## 2014-01-20 ENCOUNTER — Other Ambulatory Visit: Payer: Self-pay | Admitting: Internal Medicine

## 2014-01-21 ENCOUNTER — Other Ambulatory Visit: Payer: Self-pay | Admitting: Internal Medicine

## 2014-01-29 ENCOUNTER — Ambulatory Visit (INDEPENDENT_AMBULATORY_CARE_PROVIDER_SITE_OTHER): Payer: Medicare Other | Admitting: Family

## 2014-01-29 DIAGNOSIS — I4891 Unspecified atrial fibrillation: Secondary | ICD-10-CM

## 2014-01-29 DIAGNOSIS — Z5181 Encounter for therapeutic drug level monitoring: Secondary | ICD-10-CM

## 2014-01-29 DIAGNOSIS — I482 Chronic atrial fibrillation, unspecified: Secondary | ICD-10-CM

## 2014-01-29 DIAGNOSIS — I824Y9 Acute embolism and thrombosis of unspecified deep veins of unspecified proximal lower extremity: Secondary | ICD-10-CM

## 2014-01-29 DIAGNOSIS — I82419 Acute embolism and thrombosis of unspecified femoral vein: Secondary | ICD-10-CM

## 2014-01-29 LAB — POCT INR: INR: 2.2

## 2014-01-29 NOTE — Patient Instructions (Signed)
Continue to take 1 tablet all days except 1 1/2 tablets on Monday/Fridays.  Re-check in 4 weeks.  Anticoagulation Dose Instructions as of 01/29/2014     [name] Tue Wed Thu Fri Sat   New Dose 2 mg 3 mg 2 mg 2 mg 2 mg 3 mg 2 mg    Description       Continue to take 1 tablet all days except 1 1/2 tablets on Monday/Fridays.  Re-check in 4 weeks.

## 2014-01-30 ENCOUNTER — Ambulatory Visit: Payer: Medicare Other | Admitting: Family

## 2014-02-13 ENCOUNTER — Other Ambulatory Visit: Payer: Self-pay | Admitting: Internal Medicine

## 2014-02-16 ENCOUNTER — Other Ambulatory Visit: Payer: Self-pay | Admitting: Internal Medicine

## 2014-02-17 ENCOUNTER — Ambulatory Visit (INDEPENDENT_AMBULATORY_CARE_PROVIDER_SITE_OTHER): Payer: Medicare Other | Admitting: Internal Medicine

## 2014-02-17 ENCOUNTER — Encounter: Payer: Self-pay | Admitting: Internal Medicine

## 2014-02-17 VITALS — BP 120/60 | HR 79 | Temp 98.4°F | Resp 20 | Ht 65.0 in | Wt 158.0 lb

## 2014-02-17 DIAGNOSIS — I4891 Unspecified atrial fibrillation: Secondary | ICD-10-CM

## 2014-02-17 DIAGNOSIS — Z7901 Long term (current) use of anticoagulants: Secondary | ICD-10-CM

## 2014-02-17 DIAGNOSIS — Z23 Encounter for immunization: Secondary | ICD-10-CM

## 2014-02-17 DIAGNOSIS — I10 Essential (primary) hypertension: Secondary | ICD-10-CM

## 2014-02-17 DIAGNOSIS — G252 Other specified forms of tremor: Principal | ICD-10-CM

## 2014-02-17 DIAGNOSIS — I480 Paroxysmal atrial fibrillation: Secondary | ICD-10-CM

## 2014-02-17 DIAGNOSIS — G250 Essential tremor: Secondary | ICD-10-CM

## 2014-02-17 DIAGNOSIS — E119 Type 2 diabetes mellitus without complications: Secondary | ICD-10-CM

## 2014-02-17 DIAGNOSIS — E1149 Type 2 diabetes mellitus with other diabetic neurological complication: Secondary | ICD-10-CM

## 2014-02-17 NOTE — Progress Notes (Signed)
Subjective:    Patient ID: [name], female    DOB: [birth year], 80 y.o.   MRN: [id number]  HPI  Wt Readings from Last 3 Encounters:  02/17/14 158 lb (71.668 kg)  11/25/13 161 lb (73.029 kg)  11/06/13 162 lb (73.483 kg)   F/u DM2- remains well ocntrolled C/o bilat anterlat chest wall pain H/o CHF/ A Fib-  Self d/ced diuretic 3 weeks ago due to polyuria; no weight gain or edema  Past Medical History  Diagnosis Date  . Osteoporosis   . Hyperlipidemia   . Tremor, essential   . CHF (congestive heart failure)   . Type II or unspecified type diabetes mellitus with neurological manifestations, not stated as uncontrolled   . Hypertension   . Thyroid disease   . Diverticulosis of colon   . Atrial fibrillation   . Anxiety   . DM (diabetes mellitus)   . Pain in left ear   . Hypothyroidism   . Atrial fibrillation   . Constipation     History   Social History  . Marital Status: Unknown    Spouse Name: N/A    Number of Children: N/A  . Years of Education: N/A   Occupational History  . Not on file.   Social History Main Topics  . Smoking status: Never Smoker   . Smokeless tobacco: Never Used  . Alcohol Use: No  . Drug Use: No  . Sexual Activity: Not on file   Other Topics Concern  . Not on file   Social History Narrative  . No narrative on file    Past Surgical History  Procedure Laterality Date  . Cataract extraction  2002  . Pacemaker placement    . L3 compression fraction    . Transthoracic echocardiogram  2011, 2012    Family History  Problem Relation Age of Onset  . Heart attack Father   . Colon cancer Neg Hx     Allergies  Allergen Reactions  . Codeine Sulfate     REACTION: unspecified  . Simvastatin     REACTION: choking, acid reflux    Current Outpatient Prescriptions on File Prior to Visit  Medication Sig Dispense Refill  . ACCU-CHEK AVIVA PLUS test strip 1 each by Other route daily as needed.       Marland Kitchen ACCU-CHEK SOFTCLIX LANCETS  lancets 1 each by Other route daily as needed.       Marland Kitchen acetaminophen (TYLENOL) 650 MG CR tablet Take 650 mg by mouth every 8 (eight) hours as needed for pain.      . ciclopirox (PENLAC) 8 % solution Apply topically at bedtime. To toenails  6.6 mL  1  . diazepam (VALIUM) 5 MG tablet take 1 tablet by mouth every 8 hours if needed for sleep and anxiety  30 tablet  2  . diltiazem (CARDIZEM CD) 300 MG 24 hr capsule take 1 capsule by mouth once daily  90 capsule  1  . ferrous sulfate 325 (65 FE) MG tablet take 1 tablet by mouth once daily WITH BREAKFAST  30 tablet  5  . furosemide (LASIX) 20 MG tablet take 1 tablet by mouth once daily  90 tablet  1  . glipiZIDE (GLUCOTROL XL) 2.5 MG 24 hr tablet Take 1 tablet (2.5 mg total) by mouth daily with breakfast.  90 tablet  4  . hyoscyamine (LEVBID) 0.375 MG 12 hr tablet take 1 tablet by mouth twice a day if needed ONLY AS NEEDED FOR CRAMPING **  DO NOT TAKE EVERYDAY MUST LAST 30 DAYS**  30 tablet  1  . levothyroxine (SYNTHROID, LEVOTHROID) 75 MCG tablet take 1 tablet by mouth once daily  90 tablet  1  . meclizine (ANTIVERT) 25 MG tablet Take 1 tablet (25 mg total) by mouth 3 (three) times daily as needed for dizziness.  30 tablet  4  . polyethylene glycol powder (GLYCOLAX/MIRALAX) powder take 17GM (DISSOLVED IN WATER) by mouth once daily  255 g  3  . potassium chloride (K-DUR,KLOR-CON) 10 MEQ tablet take 1 tablet by mouth once daily  30 tablet  5  . simethicone (MYLICON) 125 MG chewable tablet Chew 125 mg by mouth every 6 (six) hours as needed.        Marland Kitchen. telmisartan (MICARDIS) 80 MG tablet take 1 tablet by mouth once daily  90 tablet  1  . warfarin (COUMADIN) 2 MG tablet Take as directed by anticoagulation clinic  120 tablet  1   No current facility-administered medications on file prior to visit.    BP 120/60  Pulse 79  Temp(Src) 98.4 F (36.9 C) (Oral)  Resp 20  Ht 5\' 5"  (1.651 m)  Wt 158 lb (71.668 kg)  BMI 26.29 kg/m2  SpO2 98%     Review of  Systems  Constitutional: Negative.   HENT: Negative for congestion, dental problem, hearing loss, rhinorrhea, sinus pressure, sore throat and tinnitus.   Eyes: Negative for pain, discharge and visual disturbance.  Respiratory: Negative for cough and shortness of breath.   Cardiovascular: Positive for chest pain. Negative for palpitations and leg swelling.  Gastrointestinal: Negative for nausea, vomiting, abdominal pain, diarrhea, constipation, blood in stool and abdominal distention.  Genitourinary: Positive for frequency. Negative for dysuria, urgency, hematuria, flank pain, vaginal bleeding, vaginal discharge, difficulty urinating, vaginal pain and pelvic pain.  Musculoskeletal: Negative for arthralgias, gait problem and joint swelling.  Skin: Negative for rash.  Neurological: Negative for dizziness, syncope, speech difficulty, weakness, numbness and headaches.  Hematological: Negative for adenopathy.  Psychiatric/Behavioral: Negative for behavioral problems, dysphoric mood and agitation. The patient is not nervous/anxious.        Objective:   Physical Exam  Constitutional: She is oriented to person, place, and time. She appears well-developed and well-nourished.  HENT:  Head: Normocephalic.  Right Ear: External ear normal.  Left Ear: External ear normal.  Mouth/Throat: Oropharynx is clear and moist.  Eyes: Conjunctivae and EOM are normal. Pupils are equal, round, and reactive to light.  Neck: Normal range of motion. Neck supple. No thyromegaly present.  Cardiovascular: Normal rate, regular rhythm, normal heart sounds and intact distal pulses.   Pulmonary/Chest: Effort normal. She has rales.  Abdominal: Soft. Bowel sounds are normal. She exhibits no mass. There is no tenderness.  Musculoskeletal: Normal range of motion. She exhibits no edema.  Lymphadenopathy:    She has no cervical adenopathy.  Neurological: She is alert and oriented to person, place, and time.  Skin: Skin is warm  and dry. No rash noted.  Psychiatric: She has a normal mood and affect. Her behavior is normal.          Assessment & Plan:   DM2 stable Non specific chest wall pain PAF CHF- appears compensated off diuretic- f/u cardiology  ROV 3 months

## 2014-02-17 NOTE — Patient Instructions (Signed)
Please check your hemoglobin A1c every 3 months  Limit your sodium (Salt) intake   

## 2014-02-17 NOTE — Progress Notes (Signed)
Pre visit review using our clinic review tool, if applicable. No additional management support is needed unless otherwise documented below in the visit note. 

## 2014-02-26 ENCOUNTER — Ambulatory Visit: Payer: Medicare Other

## 2014-03-09 ENCOUNTER — Encounter: Payer: Self-pay | Admitting: *Deleted

## 2014-03-11 ENCOUNTER — Other Ambulatory Visit: Payer: Self-pay | Admitting: Internal Medicine

## 2014-03-12 LAB — HM DIABETES EYE EXAM

## 2014-03-17 ENCOUNTER — Encounter: Payer: Self-pay | Admitting: Internal Medicine

## 2014-03-19 ENCOUNTER — Emergency Department (HOSPITAL_COMMUNITY)
Admission: EM | Admit: 2014-03-19 | Discharge: 2014-03-19 | Disposition: A | Discharge status: Home / Self Care | Payer: Medicare Other | Attending: Emergency Medicine | Admitting: Emergency Medicine

## 2014-03-19 ENCOUNTER — Ambulatory Visit (INDEPENDENT_AMBULATORY_CARE_PROVIDER_SITE_OTHER): Payer: Medicare Other | Admitting: *Deleted

## 2014-03-19 ENCOUNTER — Encounter (HOSPITAL_COMMUNITY): Payer: Self-pay | Admitting: Emergency Medicine

## 2014-03-19 ENCOUNTER — Emergency Department (HOSPITAL_COMMUNITY): Payer: Medicare Other

## 2014-03-19 DIAGNOSIS — Z7901 Long term (current) use of anticoagulants: Secondary | ICD-10-CM | POA: Insufficient documentation

## 2014-03-19 DIAGNOSIS — I10 Essential (primary) hypertension: Secondary | ICD-10-CM | POA: Insufficient documentation

## 2014-03-19 DIAGNOSIS — E1149 Type 2 diabetes mellitus with other diabetic neurological complication: Secondary | ICD-10-CM | POA: Insufficient documentation

## 2014-03-19 DIAGNOSIS — E039 Hypothyroidism, unspecified: Secondary | ICD-10-CM | POA: Diagnosis not present

## 2014-03-19 DIAGNOSIS — E079 Disorder of thyroid, unspecified: Secondary | ICD-10-CM | POA: Diagnosis not present

## 2014-03-19 DIAGNOSIS — R51 Headache: Secondary | ICD-10-CM | POA: Insufficient documentation

## 2014-03-19 DIAGNOSIS — R002 Palpitations: Secondary | ICD-10-CM | POA: Diagnosis not present

## 2014-03-19 DIAGNOSIS — R531 Weakness: Secondary | ICD-10-CM

## 2014-03-19 DIAGNOSIS — Z79899 Other long term (current) drug therapy: Secondary | ICD-10-CM | POA: Insufficient documentation

## 2014-03-19 DIAGNOSIS — R42 Dizziness and giddiness: Secondary | ICD-10-CM | POA: Insufficient documentation

## 2014-03-19 DIAGNOSIS — M810 Age-related osteoporosis without current pathological fracture: Secondary | ICD-10-CM | POA: Insufficient documentation

## 2014-03-19 DIAGNOSIS — N390 Urinary tract infection, site not specified: Secondary | ICD-10-CM | POA: Diagnosis not present

## 2014-03-19 DIAGNOSIS — I509 Heart failure, unspecified: Secondary | ICD-10-CM | POA: Insufficient documentation

## 2014-03-19 DIAGNOSIS — K5900 Constipation, unspecified: Secondary | ICD-10-CM | POA: Diagnosis not present

## 2014-03-19 DIAGNOSIS — I481 Persistent atrial fibrillation: Secondary | ICD-10-CM

## 2014-03-19 DIAGNOSIS — I4819 Other persistent atrial fibrillation: Secondary | ICD-10-CM

## 2014-03-19 LAB — MDC_IDC_ENUM_SESS_TYPE_INCLINIC
Battery Impedance: 465 Ohm
Battery Remaining Longevity: 82 mo
Battery Voltage: 2.78 V
Brady Statistic RV Percent Paced: 100 %
Date Time Interrogation Session: 20151029142740
Lead Channel Impedance Value: 653 Ohm
Lead Channel Impedance Value: 67 Ohm
Lead Channel Pacing Threshold Pulse Width: 0.4 ms
Lead Channel Setting Pacing Pulse Width: 0.4 ms
Lead Channel Setting Sensing Sensitivity: 1.4 mV
MDC IDC MSMT LEADCHNL RV PACING THRESHOLD AMPLITUDE: 0.5 V
MDC IDC MSMT LEADCHNL RV SENSING INTR AMPL: 11.2 mV
MDC IDC SET LEADCHNL RV PACING AMPLITUDE: 2.5 V

## 2014-03-19 LAB — URINALYSIS, ROUTINE W REFLEX MICROSCOPIC
Bilirubin Urine: NEGATIVE
Glucose, UA: NEGATIVE mg/dL
Hgb urine dipstick: NEGATIVE
Ketones, ur: NEGATIVE mg/dL
NITRITE: NEGATIVE
PH: 6.5 (ref 5.0–8.0)
Protein, ur: NEGATIVE mg/dL
SPECIFIC GRAVITY, URINE: 1.006 (ref 1.005–1.030)
Urobilinogen, UA: 0.2 mg/dL (ref 0.0–1.0)

## 2014-03-19 LAB — CBC
HCT: 38.6 % (ref 36.0–46.0)
HEMOGLOBIN: 12.7 g/dL (ref 12.0–15.0)
MCH: 31.4 pg (ref 26.0–34.0)
MCHC: 32.9 g/dL (ref 30.0–36.0)
MCV: 95.3 fL (ref 78.0–100.0)
Platelets: 135 10*3/uL — ABNORMAL LOW (ref 150–400)
RBC: 4.05 MIL/uL (ref 3.87–5.11)
RDW: 14.1 % (ref 11.5–15.5)
WBC: 6.5 10*3/uL (ref 4.0–10.5)

## 2014-03-19 LAB — BASIC METABOLIC PANEL
Anion gap: 14 (ref 5–15)
BUN: 17 mg/dL (ref 6–23)
CALCIUM: 9.8 mg/dL (ref 8.4–10.5)
CO2: 25 meq/L (ref 19–32)
Chloride: 98 mEq/L (ref 96–112)
Creatinine, Ser: 1.14 mg/dL — ABNORMAL HIGH (ref 0.50–1.10)
GFR calc Af Amer: 49 mL/min — ABNORMAL LOW (ref >90)
GFR calc non Af Amer: 43 mL/min — ABNORMAL LOW (ref >90)
GLUCOSE: 182 mg/dL — AB (ref 70–99)
Potassium: 3.5 mEq/L — ABNORMAL LOW (ref 3.7–5.3)
Sodium: 137 mEq/L (ref 137–147)

## 2014-03-19 LAB — PROTIME-INR
INR: 2.31 — AB (ref 0.00–1.49)
Prothrombin Time: 25.5 seconds — ABNORMAL HIGH (ref 11.6–15.2)

## 2014-03-19 LAB — URINE MICROSCOPIC-ADD ON

## 2014-03-19 LAB — I-STAT TROPONIN, ED: Troponin i, poc: 0.01 ng/mL (ref 0.00–0.08)

## 2014-03-19 LAB — CBG MONITORING, ED: Glucose-Capillary: 214 mg/dL — ABNORMAL HIGH (ref 70–99)

## 2014-03-19 MED ORDER — CEPHALEXIN 500 MG PO CAPS: 500.0000 mg | ORAL_CAPSULE | Freq: Three times a day (TID) | ORAL | Status: DC

## 2014-03-19 NOTE — Progress Notes (Signed)
Pacemaker check in clinic. Normal device function. Thresholds, sensing, impedances consistent with previous measurements. Device programmed to maximize longevity. 1 high ventricular rates noted 4 seconds. Device programmed at appropriate safety margins. Histogram distribution appropriate for patient activity level. Device programmed to optimize intrinsic conduction. Estimated longevity 7 years.  Patient education completed.  ROV 6 months with Dr. Ladona Ridgelaylor.

## 2014-03-19 NOTE — ED Provider Notes (Signed)
CSN: [id number]     Arrival date & time 03/19/14  0815 History   First MD Initiated Contact with Patient 03/19/14 [phone]     Chief Complaint  Patient presents with  . Weakness     (Consider location/radiation/quality/duration/timing/severity/associated sxs/prior Treatment) HPI 80 year old female presents with weakness and lightheadedness. She states she woke up at 3 AM to go to the bathroom and felt lightheaded like she was going to pass out. This started after she had stood up. As she rested she felt better. She went back to sleep when she woke up she felt diffusely weak. This weakness stayed until approximately 30 minutes after she took her morning meds. She called her daughter who recommended she come to the ER because she was weak for so long. She states she's actually had symptoms like this with lightheadedness and weakness for the past 6 months. She gets associated palpitations which she also had this morning. The palpitations are resolved. She felt her pulse and states it was pounding and going real fast. Denies chest pain or shortness of breath. She did have transient headache this morning when the lightheadedness is going on. She specifically states it was specifically not a room spinning sensation or feeling off balance. At this time she feels completely asymptomatic.  Past Medical History  Diagnosis Date  . Osteoporosis   . Hyperlipidemia   . Tremor, essential   . CHF (congestive heart failure)   . Type II or unspecified type diabetes mellitus with neurological manifestations, not stated as uncontrolled   . Hypertension   . Thyroid disease   . Diverticulosis of colon   . Atrial fibrillation   . Anxiety   . DM (diabetes mellitus)   . Pain in left ear   . Hypothyroidism   . Atrial fibrillation   . Constipation    Past Surgical History  Procedure Laterality Date  . Cataract extraction  2002  . Pacemaker placement    . L3 compression fraction    . Transthoracic  echocardiogram  2011, 2012   Family History  Problem Relation Age of Onset  . Heart attack Father   . Colon cancer Neg Hx    History  Substance Use Topics  . Smoking status: Never Smoker   . Smokeless tobacco: Never Used  . Alcohol Use: No   OB History   Grav Para Term Preterm Abortions TAB SAB Ect Mult Living                 Review of Systems  Constitutional: Negative for fever.  Respiratory: Negative for shortness of breath.   Cardiovascular: Positive for palpitations. Negative for chest pain.  Gastrointestinal: Negative for vomiting and abdominal pain.  Neurological: Positive for weakness, light-headedness and headaches. Negative for syncope.  All other systems reviewed and are negative.     Allergies  Codeine sulfate and Simvastatin  Home Medications   Prior to Admission medications   Medication Sig Start Date End Date Taking? Authorizing Provider  ACCU-CHEK AVIVA PLUS test strip 1 each by Other route daily as needed.  11/08/13   Historical Provider, MD  ACCU-CHEK SOFTCLIX LANCETS lancets 1 each by Other route daily as needed.  11/18/13   Historical Provider, MD  acetaminophen (TYLENOL) 650 MG CR tablet Take 650 mg by mouth every 8 (eight) hours as needed for pain.    Historical Provider, MD  ciclopirox (PENLAC) 8 % solution Apply topically at bedtime. To toenails 09/04/13   Gordy SaversPeter F Kwiatkowski, MD  diazepam (VALIUM)  5 MG tablet take 1 tablet by mouth every 8 hours if needed for sleep and anxiety 02/16/14   Gordy Savers, MD  diltiazem (CARDIZEM CD) 300 MG 24 hr capsule take 1 capsule by mouth once daily 10/16/13   Gordy Savers, MD  ferrous sulfate 325 (65 FE) MG tablet take 1 tablet by mouth once daily WITH BREAKFAST 02/13/14   Gordy Savers, MD  furosemide (LASIX) 20 MG tablet take 1 tablet by mouth once daily 01/15/14   Gordy Savers, MD  glipiZIDE (GLUCOTROL XL) 2.5 MG 24 hr tablet Take 1 tablet (2.5 mg total) by mouth daily with breakfast.  08/14/13   Gordy Savers, MD  hyoscyamine (LEVBID) 0.375 MG 12 hr tablet take 1 tablet by mouth twice a day if needed ONLY AS NEEDED FOR CRAMPING **DO NOT TAKE EVERYDAY MUST LAST 30 DAYS** 01/21/14   Gordy Savers, MD  levothyroxine (SYNTHROID, LEVOTHROID) 75 MCG tablet take 1 tablet by mouth once daily 10/16/13   Gordy Savers, MD  meclizine (ANTIVERT) 25 MG tablet Take 1 tablet (25 mg total) by mouth 3 (three) times daily as needed for dizziness. 04/24/13   Gordy Savers, MD  polyethylene glycol powder [facility]) powder take 17GM (DISSOLVED IN WATER) by mouth once daily 03/03/13   Gordy Savers, MD  potassium chloride (K-DUR,KLOR-CON) 10 MEQ tablet take 1 tablet by mouth once daily 03/13/14   Gordy Savers, MD  simethicone (MYLICON) 125 MG chewable tablet Chew 125 mg by mouth every 6 (six) hours as needed.      Historical Provider, MD  telmisartan (MICARDIS) 80 MG tablet take 1 tablet by mouth once daily    Gordy Savers, MD  warfarin (COUMADIN) 2 MG tablet Take as directed by anticoagulation clinic 10/21/13   Gordy Savers, MD   BP 145/73  Pulse 74  Temp(Src) 98.6 F (37 C) (Oral)  Resp 15  Ht 5\' 3"  (1.6 m)  Wt 154 lb (69.854 kg)  BMI 27.29 kg/m2  SpO2 97% Physical Exam  Nursing note and vitals reviewed. Constitutional: She is oriented to person, place, and time. She appears well-developed and well-nourished. No distress.  HENT:  Head: Normocephalic and atraumatic.  Right Ear: External ear normal.  Left Ear: External ear normal.  Nose: Nose normal.  Mouth/Throat: Oropharynx is clear and moist.  Eyes: EOM are normal. Pupils are equal, round, and reactive to light.  Neck: Neck supple.  Cardiovascular: Normal rate, regular rhythm, normal heart sounds and intact distal pulses.   Pulmonary/Chest: Effort normal and breath sounds normal. No respiratory distress.  Left anterior pacemaker without signs of infection or tenderness  Abdominal:  Soft. She exhibits no distension. There is no tenderness.  Musculoskeletal: She exhibits no edema.  Neurological: She is alert and oriented to person, place, and time.  CN 2-12 grossly intact. 5/5 strength in all 4 extremities  Skin: Skin is warm and dry. She is not diaphoretic. No pallor.    ED Course  Procedures (including critical care time) Labs Review Labs Reviewed  CBC - Abnormal; Notable for the following:    Platelets 135 (*)    All other components within normal limits  BASIC METABOLIC PANEL - Abnormal; Notable for the following:    Potassium 3.5 (*)    Glucose, Bld 182 (*)    Creatinine, Ser 1.14 (*)    GFR calc non Af Amer 43 (*)    GFR calc Af Amer 49 (*)  All other components within normal limits  PROTIME-INR - Abnormal; Notable for the following:    Prothrombin Time 25.5 (*)    INR 2.31 (*)    All other components within normal limits  URINALYSIS, ROUTINE W REFLEX MICROSCOPIC - Abnormal; Notable for the following:    APPearance CLOUDY (*)    Leukocytes, UA LARGE (*)    All other components within normal limits  URINE MICROSCOPIC-ADD ON - Abnormal; Notable for the following:    Squamous Epithelial / LPF FEW (*)    Bacteria, UA MANY (*)    All other components within normal limits  CBG MONITORING, ED - Abnormal; Notable for the following:    Glucose-Capillary 214 (*)    All other components within normal limits  URINE CULTURE  I-STAT TROPOININ, ED    Imaging Review Ct Head Wo Contrast  03/19/2014   CLINICAL DATA:  The patient awoke at 3 o'clock a.m. an experienced acute dizziness well bending down. There is no loss of consciousness. There was some increased weakness. The patient thinks she had an irregular heart rhythm at the time. This has since resolved. The dizziness and weakness has resolved as well.  EXAM: CT HEAD WITHOUT CONTRAST  TECHNIQUE: Contiguous axial images were obtained from the base of the skull through the vertex without intravenous contrast.   COMPARISON:  CT head without contrast 05/25/2010.  FINDINGS: Mild generalized atrophy and white matter disease is evident bilaterally. There is no significant interval change. No acute cortical infarct, hemorrhage, or mass lesion is present. Ventricles are proportionate to the degree of atrophy and stable. No significant extraaxial fluid collection is present.  A small fluid level is present in the left sphenoid sinus. The paranasal sinuses are otherwise clear. The mastoid air cells are clear. Atherosclerotic calcifications are present in the cavernous internal carotid arteries bilaterally.  IMPRESSION: 1. Stable atrophy and white matter disease. 2. No acute intracranial abnormality. 3. Atherosclerosis.   Electronically Signed   By: Gennette Pachris  Mattern M.D.   On: 03/19/2014 10:11   Dg Chest Portable 1 View  03/19/2014   CLINICAL DATA:  Heart palpitations.  EXAM: PORTABLE CHEST - 1 VIEW  COMPARISON:  Chest x-ray 06/25/2010.  FINDINGS: Mediastinum and hilar structures normal. Cardiac pacer noted with lead tips in right atrium and right ventricle. Cardiomegaly. Lungs are clear. No pleural effusion or pneumothorax. No acute bony abnormality.  IMPRESSION: 1. No acute pulmonary disease. 2. Cardiomegaly. Normal pulmonary vascularity. Cardiac pacer in stable position.   Electronically Signed   By: Maisie Fushomas  Register   On: 03/19/2014 09:33     EKG Interpretation   Date/Time:  Thursday March 19 2014 08:20:04 EDT Ventricular Rate:  75 PR Interval:  45 QRS Duration: 149 QT Interval:  426 QTC Calculation: 476 R Axis:   -82 Text Interpretation:  Age not entered, assumed to be  80 years old for  purpose of ECG interpretation Ventricular-paced rhythm No further analysis  attempted due to paced rhythm No significant change since last tracing  Confirmed by Shye Doty  MD, Johm Pfannenstiel ([id number]) on 03/19/2014 9:18:05 AM      MDM   Final diagnoses:  Palpitations  Weakness  UTI (lower urinary tract infection)    [employer]  interrogation shows no arrhythmias, only normal ventricular pacing. Patient feels completely normal now. I feel this is more near-syncope, but given transient headache while on coumadin, CT obtained. Patient does appear to have a UTI, though not symptomatic. Pacer interrogation shows no events. No symptoms suggestive of  MI. Neuro exam is normal, sounds more pre-syncope than vertigo or CVA. Given that she is asymptomatic and has had recurrent symptoms for months, will d/c home with recommended close f/u with her cardiologist and treatment of UTI.     Audree Camel, MD 03/19/14 1754

## 2014-03-19 NOTE — ED Notes (Signed)
[employer] called and reports that pt is 100% ventricular paced and everything seems to be in good working order.

## 2014-03-19 NOTE — ED Notes (Addendum)
EMS reports that pt woke up at 0300 this morning with increased weakness.  Pt bent down to get something out of the bedside cabinet and became dizzy so decided to call EMS.  No LOC.  Pt denies chest pain, n/v/d, or any other symptoms at this time.  Pt sts "I woke up this morning and was just real weak and my heart was beating out of rhythm.  I took my medicines and I feel much better now."

## 2014-03-19 NOTE — Discharge Instructions (Signed)
Palpitations A palpitation is the feeling that your heartbeat is irregular or is faster than normal. It may feel like your heart is fluttering or skipping a beat. Palpitations are usually not a serious problem. However, in some cases, you may need further medical evaluation. CAUSES  Palpitations can be caused by:  Smoking.  Caffeine or other stimulants, such as diet pills or energy drinks.  Alcohol.  Stress and anxiety.  Strenuous physical activity.  Fatigue.  Certain medicines.  Heart disease, especially if you have a history of irregular heart rhythms (arrhythmias), such as atrial fibrillation, atrial flutter, or supraventricular tachycardia.  An improperly working pacemaker or defibrillator. DIAGNOSIS  To find the cause of your palpitations, your health care provider will take your medical history and perform a physical exam. Your health care provider may also have you take a test called an ambulatory electrocardiogram (ECG). An ECG records your heartbeat patterns over a 24-hour period. You may also have other tests, such as:  Transthoracic echocardiogram (TTE). During echocardiography, sound waves are used to evaluate how blood flows through your heart.  Transesophageal echocardiogram (TEE).  Cardiac monitoring. This allows your health care provider to monitor your heart rate and rhythm in real time.  Holter monitor. This is a portable device that records your heartbeat and can help diagnose heart arrhythmias. It allows your health care provider to track your heart activity for several days, if needed.  Stress tests by exercise or by giving medicine that makes the heart beat faster. TREATMENT  Treatment of palpitations depends on the cause of your symptoms and can vary greatly. Most cases of palpitations do not require any treatment other than time, relaxation, and monitoring your symptoms. Other causes, such as atrial fibrillation, atrial flutter, or supraventricular  tachycardia, usually require further treatment. HOME CARE INSTRUCTIONS   Avoid:  Caffeinated coffee, tea, soft drinks, diet pills, and energy drinks.  Chocolate.  Alcohol.  Stop smoking if you smoke.  Reduce your stress and anxiety. Things that can help you relax include:  A method of controlling things in your body, such as your heartbeats, with your mind (biofeedback).  Yoga.  Meditation.  Physical activity such as swimming, jogging, or walking.  Get plenty of rest and sleep. SEEK MEDICAL CARE IF:   You continue to have a fast or irregular heartbeat beyond 24 hours.  Your palpitations occur more often. SEEK IMMEDIATE MEDICAL CARE IF:  You have chest pain or shortness of breath.  You have a severe headache.  You feel dizzy or you faint. MAKE SURE YOU:  Understand these instructions.  Will watch your condition.  Will get help right away if you are not doing well or get worse. Document Released: 05/05/2000 Document Revised: 05/13/2013 Document Reviewed: 07/07/2011 [facility] Patient Information 2015 [place], [place]. This information is not intended to replace advice given to you by your health care provider. Make sure you discuss any questions you have with your health care provider.    Weakness Weakness is a lack of strength. It may be felt all over the body (generalized) or in one specific part of the body (focal). Some causes of weakness can be serious. You may need further medical evaluation, especially if you are elderly or you have a history of immunosuppression (such as chemotherapy or HIV), kidney disease, heart disease, or diabetes. CAUSES  Weakness can be caused by many different things, including:  Infection.  Physical exhaustion.  Internal bleeding or other blood loss that results in a lack of red  blood cells (anemia).  Dehydration. This cause is more common in elderly people.  Side effects or electrolyte abnormalities from medicines, such as pain  medicines or sedatives.  Emotional distress, anxiety, or depression.  Circulation problems, especially severe peripheral arterial disease.  Heart disease, such as rapid atrial fibrillation, bradycardia, or heart failure.  Nervous system disorders, such as Guillain-Barr syndrome, multiple sclerosis, or stroke. DIAGNOSIS  To find the cause of your weakness, your caregiver will take your history and perform a physical exam. Lab tests or X-rays may also be ordered, if needed. TREATMENT  Treatment of weakness depends on the cause of your symptoms and can vary greatly. HOME CARE INSTRUCTIONS   Rest as needed.  Eat a well-balanced diet.  Try to get some exercise every day.  Only take over-the-counter or prescription medicines as directed by your caregiver. SEEK MEDICAL CARE IF:   Your weakness seems to be getting worse or spreads to other parts of your body.  You develop new aches or pains. SEEK IMMEDIATE MEDICAL CARE IF:   You cannot perform your normal daily activities, such as getting dressed and feeding yourself.  You cannot walk up and down stairs, or you feel exhausted when you do so.  You have shortness of breath or chest pain.  You have difficulty moving parts of your body.  You have weakness in only one area of the body or on only one side of the body.  You have a fever.  You have trouble speaking or swallowing.  You cannot control your bladder or bowel movements.  You have black or bloody vomit or stools. MAKE SURE YOU:  Understand these instructions.  Will watch your condition.  Will get help right away if you are not doing well or get worse. Document Released: 05/08/2005 Document Revised: 11/07/2011 Document Reviewed: 07/07/2011 [facility] Patient Information 2015 [place], [place]. This information is not intended to replace advice given to you by your health care provider. Make sure you discuss any questions you have with your health care  provider.    Urinary Tract Infection Urinary tract infections (UTIs) can develop anywhere along your urinary tract. Your urinary tract is your body's drainage system for removing wastes and extra water. Your urinary tract includes two kidneys, two ureters, a bladder, and a urethra. Your kidneys are a pair of bean-shaped organs. Each kidney is about the size of your fist. They are located below your ribs, one on each side of your spine. CAUSES Infections are caused by microbes, which are microscopic organisms, including fungi, viruses, and bacteria. These organisms are so small that they can only be seen through a microscope. Bacteria are the microbes that most commonly cause UTIs. SYMPTOMS  Symptoms of UTIs may vary by age and gender of the patient and by the location of the infection. Symptoms in young women typically include a frequent and intense urge to urinate and a painful, burning feeling in the bladder or urethra during urination. Older women and men are more likely to be tired, shaky, and weak and have muscle aches and abdominal pain. A fever may mean the infection is in your kidneys. Other symptoms of a kidney infection include pain in your back or sides below the ribs, nausea, and vomiting. DIAGNOSIS To diagnose a UTI, your caregiver will ask you about your symptoms. Your caregiver also will ask to provide a urine sample. The urine sample will be tested for bacteria and white blood cells. White blood cells are made by your body to  help fight infection. TREATMENT  Typically, UTIs can be treated with medication. Because most UTIs are caused by a bacterial infection, they usually can be treated with the use of antibiotics. The choice of antibiotic and length of treatment depend on your symptoms and the type of bacteria causing your infection. HOME CARE INSTRUCTIONS  If you were prescribed antibiotics, take them exactly as your caregiver instructs you. Finish the medication even if you feel  better after you have only taken some of the medication.  Drink enough water and fluids to keep your urine clear or pale yellow.  Avoid caffeine, tea, and carbonated beverages. They tend to irritate your bladder.  Empty your bladder often. Avoid holding urine for long periods of time.  Empty your bladder before and after sexual intercourse.  After a bowel movement, women should cleanse from front to back. Use each tissue only once. SEEK MEDICAL CARE IF:   You have back pain.  You develop a fever.  Your symptoms do not begin to resolve within 3 days. SEEK IMMEDIATE MEDICAL CARE IF:   You have severe back pain or lower abdominal pain.  You develop chills.  You have nausea or vomiting.  You have continued burning or discomfort with urination. MAKE SURE YOU:   Understand these instructions.  Will watch your condition.  Will get help right away if you are not doing well or get worse. Document Released: 02/15/2005 Document Revised: 11/07/2011 Document Reviewed: 06/16/2011 [facility] Patient Information 2015 [place], [place]. This information is not intended to replace advice given to you by your health care provider. Make sure you discuss any questions you have with your health care provider.

## 2014-03-19 NOTE — ED Notes (Signed)
CBG 214 

## 2014-03-21 LAB — URINE CULTURE: Colony Count: >=100000

## 2014-03-22 ENCOUNTER — Telehealth (HOSPITAL_COMMUNITY): Payer: Self-pay

## 2014-03-22 NOTE — ED Notes (Signed)
Post ED Visit - Positive Culture Follow-up  Culture report reviewed by antimicrobial stewardship pharmacist: []  Wes Dulaney, Pharm.D., BCPS [x]  Celedonio MiyamotoJeremy Frens, [address] BCPS []  Georgina PillionElizabeth Martin, Pharm.D., BCPS []  LeonardMinh Pham, [address]Ph[address] BCPS, AAHIVP []  Estella HuskMichelle Turner, Pharm.D., BCPS, AAHIVP []  Carly Sabat, Pharm.D. []  Enzo BiNathan Batchelder, [address]Ph[address]  Positive urine culture Treated with cephalexin, organism sensitive to the same and no further patient follow-up is required at this time.  Ashley JacobsFesterman, Navayah Sok C 03/22/2014, 3:21 PM

## 2014-04-02 ENCOUNTER — Ambulatory Visit (INDEPENDENT_AMBULATORY_CARE_PROVIDER_SITE_OTHER): Payer: Medicare Other | Admitting: Internal Medicine

## 2014-04-02 ENCOUNTER — Encounter: Payer: Self-pay | Admitting: *Deleted

## 2014-04-02 ENCOUNTER — Encounter: Payer: Self-pay | Admitting: Internal Medicine

## 2014-04-02 VITALS — BP 140/80 | HR 74 | Temp 98.6°F | Resp 20 | Ht 63.0 in | Wt 162.0 lb

## 2014-04-02 DIAGNOSIS — E785 Hyperlipidemia, unspecified: Secondary | ICD-10-CM

## 2014-04-02 DIAGNOSIS — I10 Essential (primary) hypertension: Secondary | ICD-10-CM

## 2014-04-02 DIAGNOSIS — I82419 Acute embolism and thrombosis of unspecified femoral vein: Secondary | ICD-10-CM

## 2014-04-02 DIAGNOSIS — I480 Paroxysmal atrial fibrillation: Secondary | ICD-10-CM

## 2014-04-02 DIAGNOSIS — Z5181 Encounter for therapeutic drug level monitoring: Secondary | ICD-10-CM

## 2014-04-02 DIAGNOSIS — E039 Hypothyroidism, unspecified: Secondary | ICD-10-CM

## 2014-04-02 DIAGNOSIS — E1140 Type 2 diabetes mellitus with diabetic neuropathy, unspecified: Secondary | ICD-10-CM

## 2014-04-02 LAB — HEMOGLOBIN A1C: Hgb A1c MFr Bld: 5.9 % (ref 4.6–6.5)

## 2014-04-02 LAB — POCT INR: INR: 2.6

## 2014-04-02 NOTE — Patient Instructions (Signed)
Continue to take 1 tablet all days except 1 1/2 tablets on Monday/Fridays.  Re-check in 4 weeks.   Anticoagulation Dose Instructions as of 04/02/2014      Glynis SmilesSun Mon Tue Wed Thu Fri Sat   New Dose 2 mg 3 mg 2 mg 2 mg 2 mg 3 mg 2 mg    Description        Continue to take 1 tablet all days except 1 1/2 tablets on Monday/Fridays.  Re-check in 4 weeks.

## 2014-04-02 NOTE — Progress Notes (Signed)
Subjective:    Patient ID: [name], female    DOB: 07/07/1926, 80 y.o.   MRN: [id number]  HPI 80 year old patient who has a history of paroxysmal atrial fibrillation.  She remains on Coumadin anticoagulation.  She was seen in the ED a couple weeks ago with weakness and palpitations.  She was noted have a UTI and has completed antibiotic therapy.  Today, she feels well and is at baseline.  She has a history of anxiety and has a number of concerns and worries.  Her cardiac status has been quite stable.  No fever or dysuria or urinary frequency. She does have a history of type 2 diabetes.  Hemoglobin A1c has been very well-controlled on glipizide.  No recent hemoglobin A1c is  Past Medical History  Diagnosis Date  . Osteoporosis   . Hyperlipidemia   . Tremor, essential   . CHF (congestive heart failure)   . Type II or unspecified type diabetes mellitus with neurological manifestations, not stated as uncontrolled   . Hypertension   . Thyroid disease   . Diverticulosis of colon   . Atrial fibrillation   . Anxiety   . DM (diabetes mellitus)   . Pain in left ear   . Hypothyroidism   . Atrial fibrillation   . Constipation     History   Social History  . Marital Status: Unknown    Spouse Name: N/A    Number of Children: N/A  . Years of Education: N/A   Occupational History  . Not on file.   Social History Main Topics  . Smoking status: Never Smoker   . Smokeless tobacco: Never Used  . Alcohol Use: No  . Drug Use: No  . Sexual Activity: Not on file   Other Topics Concern  . Not on file   Social History Narrative    Past Surgical History  Procedure Laterality Date  . Cataract extraction  2002  . Pacemaker placement    . L3 compression fraction    . Transthoracic echocardiogram  2011, 2012    Family History  Problem Relation Age of Onset  . Heart attack Father   . Colon cancer Neg Hx     Allergies  Allergen Reactions  . Codeine Sulfate     REACTION:  unspecified  . Simvastatin     REACTION: choking, acid reflux    Current Outpatient Prescriptions on File Prior to Visit  Medication Sig Dispense Refill  . ACCU-CHEK AVIVA PLUS test strip 1 each by Other route daily as needed.     Marland Kitchen. ACCU-CHEK SOFTCLIX LANCETS lancets 1 each by Other route daily as needed.     Marland Kitchen. acetaminophen (TYLENOL) 650 MG CR tablet Take 650 mg by mouth every 8 (eight) hours as needed for pain.    . cephALEXin (KEFLEX) 500 MG capsule Take 1 capsule (500 mg total) by mouth 3 (three) times daily. 21 capsule 0  . ciclopirox (PENLAC) 8 % solution Apply topically at bedtime. To toenails 6.6 mL 1  . diazepam (VALIUM) 5 MG tablet take 1 tablet by mouth every 8 hours if needed for sleep and anxiety 30 tablet 2  . diltiazem (CARDIZEM CD) 300 MG 24 hr capsule take 1 capsule by mouth once daily 90 capsule 1  . ferrous sulfate 325 (65 FE) MG tablet take 1 tablet by mouth once daily WITH BREAKFAST 30 tablet 5  . furosemide (LASIX) 20 MG tablet take 1 tablet by mouth once daily 90 tablet 1  .  glipiZIDE (GLUCOTROL XL) 2.5 MG 24 hr tablet Take 1 tablet (2.5 mg total) by mouth daily with breakfast. 90 tablet 4  . hyoscyamine (LEVBID) 0.375 MG 12 hr tablet take 1 tablet by mouth twice a day if needed ONLY AS NEEDED FOR CRAMPING **DO NOT TAKE EVERYDAY MUST LAST 30 DAYS** 30 tablet 1  . levothyroxine (SYNTHROID, LEVOTHROID) 75 MCG tablet take 1 tablet by mouth once daily 90 tablet 1  . meclizine (ANTIVERT) 25 MG tablet Take 1 tablet (25 mg total) by mouth 3 (three) times daily as needed for dizziness. 30 tablet 4  . polyethylene glycol powder (GLYCOLAX/MIRALAX) powder take 17GM (DISSOLVED IN WATER) by mouth once daily 255 g 3  . potassium chloride (K-DUR,KLOR-CON) 10 MEQ tablet take 1 tablet by mouth once daily 30 tablet 5  . simethicone (MYLICON) 125 MG chewable tablet Chew 125 mg by mouth every 6 (six) hours as needed.      Marland Kitchen telmisartan (MICARDIS) 80 MG tablet take 1 tablet by mouth once  daily 90 tablet 1  . warfarin (COUMADIN) 2 MG tablet Take as directed by anticoagulation clinic 120 tablet 1   No current facility-administered medications on file prior to visit.    BP 140/80 mmHg  Pulse 74  Temp(Src) 98.6 F (37 C) (Oral)  Resp 20  Ht 5\' 3"  (1.6 m)  Wt 162 lb (73.483 kg)  BMI 28.70 kg/m2  SpO2 97%     Review of Systems  Constitutional: Negative.   HENT: Negative for congestion, dental problem, hearing loss, rhinorrhea, sinus pressure, sore throat and tinnitus.   Eyes: Negative for pain, discharge and visual disturbance.  Respiratory: Negative for cough and shortness of breath.   Cardiovascular: Negative for chest pain, palpitations and leg swelling.  Gastrointestinal: Negative for nausea, vomiting, abdominal pain, diarrhea, constipation, blood in stool and abdominal distention.  Genitourinary: Negative for dysuria, urgency, frequency, hematuria, flank pain, vaginal bleeding, vaginal discharge, difficulty urinating, vaginal pain and pelvic pain.  Musculoskeletal: Negative for joint swelling, arthralgias and gait problem.  Skin: Negative for rash.  Neurological: Negative for dizziness, syncope, speech difficulty, weakness, numbness and headaches.  Hematological: Negative for adenopathy.  Psychiatric/Behavioral: Negative for behavioral problems, dysphoric mood and agitation. The patient is nervous/anxious.        Objective:   Physical Exam  Constitutional: She is oriented to person, place, and time. She appears well-developed and well-nourished.  HENT:  Head: Normocephalic.  Right Ear: External ear normal.  Left Ear: External ear normal.  Mouth/Throat: Oropharynx is clear and moist.  Eyes: Conjunctivae and EOM are normal. Pupils are equal, round, and reactive to light.  Neck: Normal range of motion. Neck supple. No thyromegaly present.  Cardiovascular: Normal rate, regular rhythm, normal heart sounds and intact distal pulses.   Rhythm regular    Pulmonary/Chest: Effort normal and breath sounds normal.  Rare basilar crackles  Abdominal: Soft. Bowel sounds are normal. She exhibits no mass. There is no tenderness.  Musculoskeletal: Normal range of motion. She exhibits edema.  Trace pedal edema  Lymphadenopathy:    She has no cervical adenopathy.  Neurological: She is alert and oriented to person, place, and time.  Head and neck tremor  Skin: Skin is warm and dry. No rash noted.  Psychiatric: She has a normal mood and affect. Her behavior is normal.          Assessment & Plan:   Paroxysmal atrial fibrillation.  Will continue anticoagulation Hypertension, stable Diabetes mellitus.  Will check a hemoglobin  A1c Anxiety disorder  Recheck 6 months or as needed

## 2014-04-02 NOTE — Patient Instructions (Signed)
Limit your sodium (Salt) intake  Return in 6 months for follow-up  

## 2014-04-02 NOTE — Progress Notes (Signed)
Pre visit review using our clinic review tool, if applicable. No additional management support is needed unless otherwise documented below in the visit note. 

## 2014-04-10 ENCOUNTER — Other Ambulatory Visit: Payer: Self-pay | Admitting: *Deleted

## 2014-04-10 MED ORDER — DIAZEPAM 5 MG PO TABS: ORAL_TABLET | ORAL | Status: DC

## 2014-04-13 ENCOUNTER — Other Ambulatory Visit: Payer: Self-pay | Admitting: Internal Medicine

## 2014-04-14 ENCOUNTER — Encounter: Payer: Self-pay | Admitting: Internal Medicine

## 2014-04-17 ENCOUNTER — Other Ambulatory Visit: Payer: Self-pay | Admitting: Internal Medicine

## 2014-04-30 ENCOUNTER — Ambulatory Visit (INDEPENDENT_AMBULATORY_CARE_PROVIDER_SITE_OTHER): Payer: Medicare Other | Admitting: Family

## 2014-04-30 ENCOUNTER — Other Ambulatory Visit: Payer: Self-pay | Admitting: Internal Medicine

## 2014-04-30 DIAGNOSIS — I82419 Acute embolism and thrombosis of unspecified femoral vein: Secondary | ICD-10-CM

## 2014-04-30 DIAGNOSIS — Z5181 Encounter for therapeutic drug level monitoring: Secondary | ICD-10-CM

## 2014-04-30 DIAGNOSIS — I480 Paroxysmal atrial fibrillation: Secondary | ICD-10-CM

## 2014-04-30 LAB — POCT INR: INR: 2.5

## 2014-04-30 NOTE — Patient Instructions (Signed)
Continue to take 1 tablet all days except 1 1/2 tablets on Monday/Fridays.  Re-check in 4 weeks.   Anticoagulation Dose Instructions as of 04/30/2014      Maria SmilesSun Mon Tue Wed Thu Fri Sat   New Dose 2 mg 3 mg 2 mg 2 mg 2 mg 3 mg 2 mg    Description        Continue to take 1 tablet all days except 1 1/2 tablets on Monday/Fridays.  Re-check in 4 weeks.

## 2014-05-14 ENCOUNTER — Other Ambulatory Visit: Payer: Self-pay | Admitting: Internal Medicine

## 2014-05-14 NOTE — Telephone Encounter (Signed)
Please advise if okay to refill Cipro?

## 2014-05-14 NOTE — Telephone Encounter (Signed)
ok 

## 2014-05-28 ENCOUNTER — Ambulatory Visit (INDEPENDENT_AMBULATORY_CARE_PROVIDER_SITE_OTHER): Payer: Medicare Other | Admitting: Family

## 2014-05-28 DIAGNOSIS — I480 Paroxysmal atrial fibrillation: Secondary | ICD-10-CM | POA: Diagnosis not present

## 2014-05-28 DIAGNOSIS — I82419 Acute embolism and thrombosis of unspecified femoral vein: Secondary | ICD-10-CM

## 2014-05-28 DIAGNOSIS — Z5181 Encounter for therapeutic drug level monitoring: Secondary | ICD-10-CM

## 2014-05-28 LAB — POCT INR: INR: 2.2

## 2014-05-28 NOTE — Patient Instructions (Signed)
Continue to take 1 tablet daily. Recheck in 4 weeks. .  Anticoagulation Dose Instructions as of 05/28/2014      Maria SmilesSun Mon Tue Wed Thu Fri Sat   New Dose 2 mg 2 mg 2 mg 2 mg 2 mg 2 mg 2 mg    Description        Continue to take 1 tablet daily. Recheck in 4 weeks. .Marland Kitchen

## 2014-05-29 ENCOUNTER — Other Ambulatory Visit: Payer: Self-pay | Admitting: Internal Medicine

## 2014-06-02 ENCOUNTER — Ambulatory Visit (INDEPENDENT_AMBULATORY_CARE_PROVIDER_SITE_OTHER): Payer: Medicare Other | Admitting: Internal Medicine

## 2014-06-02 ENCOUNTER — Encounter: Payer: Self-pay | Admitting: Internal Medicine

## 2014-06-02 VITALS — BP 140/70 | HR 79 | Temp 98.0°F | Resp 20 | Ht 63.0 in | Wt 161.0 lb

## 2014-06-02 DIAGNOSIS — I482 Chronic atrial fibrillation, unspecified: Secondary | ICD-10-CM

## 2014-06-02 DIAGNOSIS — E0840 Diabetes mellitus due to underlying condition with diabetic neuropathy, unspecified: Secondary | ICD-10-CM | POA: Diagnosis not present

## 2014-06-02 DIAGNOSIS — Z7901 Long term (current) use of anticoagulants: Secondary | ICD-10-CM

## 2014-06-02 NOTE — Progress Notes (Signed)
Pre visit review using our clinic review tool, if applicable. No additional management support is needed unless otherwise documented below in the visit note. 

## 2014-06-02 NOTE — Progress Notes (Signed)
Subjective:    Patient ID: [name], female    DOB: [birth year], 81 y.o.   MRN: [id number]  HPI  81 year old patient who has been on chronic anticoagulation due to DVT and atrial fibrillation.  She has been on chronic Coumadin anticoagulation.  Office visit was prompted by patient's initial request to consider alternate anticoagulation.  She explore this with her pharmacy and medication was going to be much higher.  She wishes to continue with Coumadin.  Compliance with the medication discussed  The patient is scheduled for follow-up next month Recent INR was therapeutic  Past Medical History  Diagnosis Date  . Osteoporosis   . Hyperlipidemia   . Tremor, essential   . CHF (congestive heart failure)   . Type II or unspecified type diabetes mellitus with neurological manifestations, not stated as uncontrolled   . Hypertension   . Thyroid disease   . Diverticulosis of colon   . Atrial fibrillation   . Anxiety   . DM (diabetes mellitus)   . Pain in left ear   . Hypothyroidism   . Atrial fibrillation   . Constipation     History   Social History  . Marital Status: Unknown    Spouse Name: N/A    Number of Children: N/A  . Years of Education: N/A   Occupational History  . Not on file.   Social History Main Topics  . Smoking status: Never Smoker   . Smokeless tobacco: Never Used  . Alcohol Use: No  . Drug Use: No  . Sexual Activity: Not on file   Other Topics Concern  . Not on file   Social History Narrative    Past Surgical History  Procedure Laterality Date  . Cataract extraction  2002  . Pacemaker placement    . L3 compression fraction    . Transthoracic echocardiogram  2011, 2012    Family History  Problem Relation Age of Onset  . Heart attack Father   . Colon cancer Neg Hx     Allergies  Allergen Reactions  . Codeine Sulfate     REACTION: unspecified  . Simvastatin     REACTION: choking, acid reflux    Current Outpatient Prescriptions  on File Prior to Visit  Medication Sig Dispense Refill  . ACCU-CHEK AVIVA PLUS test strip 1 each by Other route daily as needed.     Marland Kitchen ACCU-CHEK SOFTCLIX LANCETS lancets 1 each by Other route daily as needed.     Marland Kitchen acetaminophen (TYLENOL) 650 MG CR tablet Take 650 mg by mouth every 8 (eight) hours as needed for pain.    Marland Kitchen CARTIA XT 300 MG 24 hr capsule take 1 capsule by mouth once daily 90 capsule 1  . ciclopirox (PENLAC) 8 % solution Apply topically at bedtime. To toenails 6.6 mL 1  . diazepam (VALIUM) 5 MG tablet take 1 tablet by mouth every 8 hours if needed for sleep and anxiety 60 tablet 2  . ferrous sulfate 325 (65 FE) MG tablet take 1 tablet by mouth once daily WITH BREAKFAST 30 tablet 5  . furosemide (LASIX) 20 MG tablet take 1 tablet by mouth once daily 90 tablet 1  . glipiZIDE (GLUCOTROL XL) 2.5 MG 24 hr tablet Take 1 tablet (2.5 mg total) by mouth daily with breakfast. 90 tablet 4  . hyoscyamine (LEVBID) 0.375 MG 12 hr tablet take 1 tablet by mouth twice a day if needed     ONLY AS NEEDED FOR CRAMPING AND (  DO NOT TAKE EVERYDAY-MUST LAST 30 DAYS. 30 tablet 1  . levothyroxine (SYNTHROID, LEVOTHROID) 75 MCG tablet take 1 tablet by mouth once daily 90 tablet 1  . meclizine (ANTIVERT) 25 MG tablet Take 1 tablet (25 mg total) by mouth 3 (three) times daily as needed for dizziness. 30 tablet 4  . polyethylene glycol powder (GLYCOLAX/MIRALAX) powder take 17GM (DISSOLVED IN WATER) by mouth once daily 255 g 3  . potassium chloride (K-DUR,KLOR-CON) 10 MEQ tablet take 1 tablet by mouth once daily 30 tablet 5  . simethicone (MYLICON) 125 MG chewable tablet Chew 125 mg by mouth every 6 (six) hours as needed.      Marland Kitchen. telmisartan (MICARDIS) 80 MG tablet take 1 tablet by mouth once daily 90 tablet 1  . warfarin (COUMADIN) 2 MG tablet TAKE AS DIRECTED BY ANTICOAGULATION CLINIC 120 tablet 1   No current facility-administered medications on file prior to visit.    BP 140/70 mmHg  Pulse 79  Temp(Src)  98 F (36.7 C) (Oral)  Resp 20  Ht 5\' 3"  (1.6 m)  Wt 161 lb (73.029 kg)  BMI 28.53 kg/m2  SpO2 97%     Review of Systems  Constitutional: Negative.   HENT: Negative for congestion, dental problem, hearing loss, rhinorrhea, sinus pressure, sore throat and tinnitus.   Eyes: Negative for pain, discharge and visual disturbance.  Respiratory: Negative for cough and shortness of breath.   Cardiovascular: Negative for chest pain, palpitations and leg swelling.  Gastrointestinal: Negative for nausea, vomiting, abdominal pain, diarrhea, constipation, blood in stool and abdominal distention.  Genitourinary: Negative for dysuria, urgency, frequency, hematuria, flank pain, vaginal bleeding, vaginal discharge, difficulty urinating, vaginal pain and pelvic pain.  Musculoskeletal: Negative for joint swelling, arthralgias and gait problem.  Skin: Negative for rash.  Neurological: Negative for dizziness, syncope, speech difficulty, weakness, numbness and headaches.  Hematological: Negative for adenopathy.  Psychiatric/Behavioral: Negative for behavioral problems, dysphoric mood and agitation. The patient is not nervous/anxious.        Objective:   Physical Exam  Constitutional: She appears well-developed and well-nourished. No distress.          Assessment & Plan:   Chronic Coumadin anticoagulation Chronic atrial fibrillation  Options discussed at length.  Patient wishes to continue with Coumadin anticoagulation Follow-up Coumadin clinic as scheduled Recheck 3 months

## 2014-06-02 NOTE — Patient Instructions (Signed)
Limit your sodium (Salt) intake  Return in 3 months for follow-up   

## 2014-06-19 ENCOUNTER — Other Ambulatory Visit: Payer: Self-pay

## 2014-06-19 MED ORDER — GLUCOSE BLOOD VI STRP: 1.0000 | ORAL_STRIP | Freq: Every day | Status: DC | PRN

## 2014-06-19 NOTE — Telephone Encounter (Signed)
Rx request for Accu Check Aviva Plus Test Strips- Test daily.    Rx sent to pharmacy.

## 2014-06-25 ENCOUNTER — Ambulatory Visit (INDEPENDENT_AMBULATORY_CARE_PROVIDER_SITE_OTHER): Payer: Medicare Other | Admitting: General Practice

## 2014-06-25 DIAGNOSIS — Z5181 Encounter for therapeutic drug level monitoring: Secondary | ICD-10-CM | POA: Diagnosis not present

## 2014-06-25 DIAGNOSIS — I4891 Unspecified atrial fibrillation: Secondary | ICD-10-CM | POA: Diagnosis not present

## 2014-06-25 DIAGNOSIS — I82419 Acute embolism and thrombosis of unspecified femoral vein: Secondary | ICD-10-CM | POA: Diagnosis not present

## 2014-06-25 LAB — POCT INR: INR: 2.7

## 2014-06-25 NOTE — Progress Notes (Signed)
Pre visit review using our clinic review tool, if applicable. No additional management support is needed unless otherwise documented below in the visit note. 

## 2014-07-07 ENCOUNTER — Other Ambulatory Visit: Payer: Self-pay | Admitting: Internal Medicine

## 2014-07-16 ENCOUNTER — Telehealth: Payer: Self-pay | Admitting: Internal Medicine

## 2014-07-16 ENCOUNTER — Ambulatory Visit (INDEPENDENT_AMBULATORY_CARE_PROVIDER_SITE_OTHER): Payer: Medicare Other | Admitting: Family Medicine

## 2014-07-16 ENCOUNTER — Encounter: Payer: Self-pay | Admitting: Family Medicine

## 2014-07-16 VITALS — BP 122/60 | HR 88 | Temp 97.6°F | Ht 63.0 in | Wt 163.6 lb

## 2014-07-16 DIAGNOSIS — M25511 Pain in right shoulder: Secondary | ICD-10-CM | POA: Diagnosis not present

## 2014-07-16 MED ORDER — ACCU-CHEK AVIVA PLUS W/DEVICE KIT: PACK | Status: DC

## 2014-07-16 NOTE — Patient Instructions (Signed)
Please avoid put arm above head for long periods of time  Follow up with Dr. Kirtland BouchardK in 3-4 weeks if needed or if recurs

## 2014-07-16 NOTE — Progress Notes (Signed)
Pre visit review using our clinic review tool, if applicable. No additional management support is needed unless otherwise documented below in the visit note. 

## 2014-07-16 NOTE — Telephone Encounter (Signed)
Patient has appointment today with MD Selena BattenKim

## 2014-07-16 NOTE — Telephone Encounter (Signed)
[facility] - Client TELEPHONE ADVICE RECORD [facility] Patient Name: Maria ProphetMAXINE Holmes DOB: 03/12/1927 Initial Comment Caller states, she has arm pain after putting her arm behind her onTues, her shoulder and arm was numb. Today her hand still feels like it is asleep, numb. -- she is diabetic and also wanting to get a blood monitor, needs an RX for her pharm. Nurse Assessment Nurse: Apolinar JunesBrandon, RN, Darl PikesSusan Date/Time Lamount Cohen(Eastern Time): 07/16/2014 11:09:21 AM Confirm and document reason for call. If symptomatic, describe symptoms. ---Caller states she has arm pain after putting her arm behind her on Tuesday - her upper back was hurting so she was reaching behind her to wash her back and rub it - after she made that movement she sat in her recliner and she noticed her shoulder and arm was numb and sort of tingling - Today her hand still feels like it is asleep, numb - its her upper back that is hurting but it does feel better then it did so it is better then it was Tuesday but she can still feel it - states she talked to her dgt and she feels she has a pinched nerve - she has had no other symptoms, no weakness, slurred speech, no confusion - she is diabetic and also wanting to get a blood monitor, needs an RX for Has the patient traveled out of the country within the last 30 days? ---Not Applicable Does the patient require triage? ---Yes Related visit to physician within the last 2 weeks? ---No Does the PT have any chronic conditions? (i.e. diabetes, asthma, etc.) ---Yes List chronic conditions. ---see chart Guidelines Guideline Title Affirmed Question Affirmed Notes Neurologic Deficit Neck pain (and neurologic deficit) Final Disposition User See Physician within 4 Hours (or PCP triage) Apolinar JunesBrandon, RN, Darl PikesSusan Comments scheduled appointment with Kriste BasqueKim Hannah at 3:15 which falls with in the 4 hours time frame - states she will have her grand  daughter bring her

## 2014-07-16 NOTE — Progress Notes (Signed)
HPI:  R arm pain: -started 2 days ago after putting arm and hand up in awkward position behind head for 10 minutes while talking on the phone  -when she brought arm back down felt like it was not right -heavy, tingling, pain in R shoulder and upper arm mild -then reached back and and felt tingling once, she thinks this is a pinched nerve -symptoms have resolved now -denies: fevers, malaise, falls, HA, vision changes, weakness, no speech issues, not symptoms elsewhere, CP, SOB, DOE  ROS: See pertinent positives and negatives per HPI.  Past Medical History  Diagnosis Date  . Osteoporosis   . Hyperlipidemia   . Tremor, essential   . CHF (congestive heart failure)   . Type II or unspecified type diabetes mellitus with neurological manifestations, not stated as uncontrolled   . Hypertension   . Thyroid disease   . Diverticulosis of colon   . Atrial fibrillation   . Anxiety   . DM (diabetes mellitus)   . Pain in left ear   . Hypothyroidism   . Atrial fibrillation   . Constipation     Past Surgical History  Procedure Laterality Date  . Cataract extraction  2002  . Pacemaker placement    . L3 compression fraction    . Transthoracic echocardiogram  2011, 2012    Family History  Problem Relation Age of Onset  . Heart attack Father   . Colon cancer Neg Hx     History   Social History  . Marital Status: Unknown    Spouse Name: N/A  . Number of Children: N/A  . Years of Education: N/A   Social History Main Topics  . Smoking status: Never Smoker   . Smokeless tobacco: Never Used  . Alcohol Use: No  . Drug Use: No  . Sexual Activity: Not on file   Other Topics Concern  . None   Social History Narrative     Current outpatient prescriptions:  .  ACCU-CHEK SOFTCLIX LANCETS lancets, 1 each by Other route daily as needed. , Disp: , Rfl:  .  ACCU-CHEK SOFTCLIX LANCETS lancets, TEST once daily, Disp: 100 each, Rfl: 12 .  acetaminophen (TYLENOL) 650 MG CR tablet, Take  650 mg by mouth every 8 (eight) hours as needed for pain., Disp: , Rfl:  .  CARTIA XT 300 MG 24 hr capsule, take 1 capsule by mouth once daily, Disp: 90 capsule, Rfl: 1 .  ciclopirox (PENLAC) 8 % solution, Apply topically at bedtime. To toenails, Disp: 6.6 mL, Rfl: 1 .  diazepam (VALIUM) 5 MG tablet, take 1 tablet by mouth every 8 hours if needed for sleep and anxiety, Disp: 60 tablet, Rfl: 2 .  ferrous sulfate 325 (65 FE) MG tablet, take 1 tablet by mouth once daily WITH BREAKFAST, Disp: 30 tablet, Rfl: 5 .  furosemide (LASIX) 20 MG tablet, take 1 tablet by mouth once daily, Disp: 90 tablet, Rfl: 1 .  glipiZIDE (GLUCOTROL XL) 2.5 MG 24 hr tablet, Take 1 tablet (2.5 mg total) by mouth daily with breakfast., Disp: 90 tablet, Rfl: 4 .  glucose blood (ACCU-CHEK AVIVA PLUS) test strip, 1 each by Other route daily as needed., Disp: 100 each, Rfl: 5 .  hyoscyamine (LEVBID) 0.375 MG 12 hr tablet, take 1 tablet by mouth twice a day ONLY AS NEEDED FOR CRAMPING    (DO NOT TAKE EVERY DAY -- MUST LAST 30 DAYS), Disp: 30 tablet, Rfl: 2 .  levothyroxine (SYNTHROID, LEVOTHROID) 75 MCG tablet,  take 1 tablet by mouth once daily, Disp: 90 tablet, Rfl: 1 .  meclizine (ANTIVERT) 25 MG tablet, Take 1 tablet (25 mg total) by mouth 3 (three) times daily as needed for dizziness., Disp: 30 tablet, Rfl: 4 .  polyethylene glycol powder (GLYCOLAX/MIRALAX) powder, take 17GM (DISSOLVED IN WATER) by mouth once daily, Disp: 255 g, Rfl: 3 .  potassium chloride (K-DUR,KLOR-CON) 10 MEQ tablet, take 1 tablet by mouth once daily, Disp: 30 tablet, Rfl: 5 .  simethicone (MYLICON) 932 MG chewable tablet, Chew 125 mg by mouth every 6 (six) hours as needed.  , Disp: , Rfl:  .  telmisartan (MICARDIS) 80 MG tablet, take 1 tablet by mouth once daily, Disp: 90 tablet, Rfl: 1 .  warfarin (COUMADIN) 2 MG tablet, TAKE AS DIRECTED BY ANTICOAGULATION CLINIC, Disp: 120 tablet, Rfl: 1 .  Blood Glucose Monitoring Suppl (ACCU-CHEK AVIVA PLUS) W/DEVICE  KIT, Use as directed, Disp: 1 kit, Rfl: 0  EXAM:  Filed Vitals:   07/16/14 1525  BP: 122/60  Pulse: 88  Temp: 97.6 F (36.4 C)    Body mass index is 28.99 kg/(m^2).  GENERAL: vitals reviewed and listed above, alert, oriented, appears well hydrated and in no acute distress  HEENT: atraumatic, conjunttiva clear, no obvious abnormalities on inspection of external nose and ears  NECK: no obvious masses on inspection  LUNGS: clear to auscultation bilaterally, no wheezes, rales or rhonchi, good air movement  CV: HRRR, no peripheral edema  MS/NEURO: moves all extremities without noticeable abnormality Normal inspection of hands, arms shoulders Normal movements of hands arms and shoulders with normal strength and sensation throughout bilat UE, normal distal pulses  PSYCH: pleasant and cooperative, no obvious depression or anxiety  ASSESSMENT AND PLAN:  Discussed the following assessment and plan:  Pain in joint, shoulder region, right  -likely related to holding arm in awkward position, possible poor vascular flow during episode, mild nerve impingement or stretching vs muscular strain, symptoms resolved now -she was concerned about stroke - discussed stroke symptoms, very unlikely in this case  -Patient advised to return or notify a doctor immediately if symptoms worsen or persist or new concerns arise.  Patient Instructions  Please avoid put arm above head for long periods of time  Follow up with Dr. Raliegh Ip in 3-4 weeks if needed or if recurs     Colin Benton R.

## 2014-07-22 ENCOUNTER — Other Ambulatory Visit: Payer: Self-pay | Admitting: Internal Medicine

## 2014-08-01 ENCOUNTER — Other Ambulatory Visit: Payer: Self-pay | Admitting: Internal Medicine

## 2014-08-06 ENCOUNTER — Ambulatory Visit (INDEPENDENT_AMBULATORY_CARE_PROVIDER_SITE_OTHER): Payer: Medicare Other | Admitting: General Practice

## 2014-08-06 DIAGNOSIS — Z5181 Encounter for therapeutic drug level monitoring: Secondary | ICD-10-CM

## 2014-08-06 DIAGNOSIS — I82419 Acute embolism and thrombosis of unspecified femoral vein: Secondary | ICD-10-CM

## 2014-08-06 LAB — POCT INR: INR: 3.7

## 2014-08-06 NOTE — Progress Notes (Signed)
Pre visit review using our clinic review tool, if applicable. No additional management support is needed unless otherwise documented below in the visit note. 

## 2014-08-21 ENCOUNTER — Encounter: Payer: Self-pay | Admitting: *Deleted

## 2014-08-24 ENCOUNTER — Ambulatory Visit (INDEPENDENT_AMBULATORY_CARE_PROVIDER_SITE_OTHER): Payer: Medicare Other | Admitting: General Practice

## 2014-08-24 DIAGNOSIS — I82419 Acute embolism and thrombosis of unspecified femoral vein: Secondary | ICD-10-CM | POA: Diagnosis not present

## 2014-08-24 DIAGNOSIS — Z5181 Encounter for therapeutic drug level monitoring: Secondary | ICD-10-CM | POA: Diagnosis not present

## 2014-08-24 DIAGNOSIS — I4891 Unspecified atrial fibrillation: Secondary | ICD-10-CM | POA: Diagnosis not present

## 2014-08-24 LAB — POCT INR: INR: 2.4

## 2014-08-24 NOTE — Progress Notes (Signed)
Pre visit review using our clinic review tool, if applicable. No additional management support is needed unless otherwise documented below in the visit note. 

## 2014-09-08 ENCOUNTER — Other Ambulatory Visit: Payer: Self-pay | Admitting: Internal Medicine

## 2014-09-08 ENCOUNTER — Ambulatory Visit (INDEPENDENT_AMBULATORY_CARE_PROVIDER_SITE_OTHER): Payer: Medicare Other | Admitting: Internal Medicine

## 2014-09-08 ENCOUNTER — Encounter: Payer: Self-pay | Admitting: Internal Medicine

## 2014-09-08 VITALS — BP 114/56 | HR 86 | Ht 63.0 in | Wt 162.2 lb

## 2014-09-08 DIAGNOSIS — I4891 Unspecified atrial fibrillation: Secondary | ICD-10-CM

## 2014-09-08 DIAGNOSIS — E785 Hyperlipidemia, unspecified: Secondary | ICD-10-CM | POA: Diagnosis not present

## 2014-09-08 DIAGNOSIS — Z950 Presence of cardiac pacemaker: Secondary | ICD-10-CM

## 2014-09-08 LAB — MDC_IDC_ENUM_SESS_TYPE_INCLINIC
Battery Impedance: 590 Ohm
Battery Remaining Longevity: 74 mo
Battery Voltage: 2.78 V
Date Time Interrogation Session: 20160419124850
Lead Channel Pacing Threshold Amplitude: 0.75 V
Lead Channel Pacing Threshold Pulse Width: 0.4 ms
Lead Channel Setting Sensing Sensitivity: 1.4 mV
MDC IDC MSMT LEADCHNL RA IMPEDANCE VALUE: 67 Ohm
MDC IDC MSMT LEADCHNL RV IMPEDANCE VALUE: 682 Ohm
MDC IDC SET LEADCHNL RV PACING AMPLITUDE: 2.5 V
MDC IDC SET LEADCHNL RV PACING PULSEWIDTH: 0.4 ms
MDC IDC STAT BRADY RV PERCENT PACED: 100 %

## 2014-09-08 NOTE — Assessment & Plan Note (Signed)
Her Medtronic single-chamber pacemaker is working normally. We'll plan to recheck in several months. 

## 2014-09-08 NOTE — Assessment & Plan Note (Signed)
She is encouraged to maintain a low-fat diet. She will continue her current medical therapy. She has been intolerant to statins in the past.

## 2014-09-08 NOTE — Patient Instructions (Signed)
Medication Instructions:  Your physician recommends that you continue on your current medications as directed. Please refer to the Current Medication list given to you today.   Labwork: NONE  Testing/Procedures: NONE  Follow-Up: Remote monitoring is used to monitor your Pacemaker or ICD from home. This monitoring reduces the number of office visits required to check your device to one time per year. It allows us to keep an eye on the functioning of your device to ensure it is working properly. You are scheduled for a device check from home on 12/07/2017. You may send your transmission at any time that day. If you have a wireless device, the transmission will be sent automatically. After your physician reviews your transmission, you will receive a postcard with your next transmission date.  Your physician wants you to follow-up in: 12 months with Dr. Ladona Ridgelaylor. You will receive a reminder letter in the mail two months in advance. If you don't receive a letter, please call our office to schedule the follow-up appointment.   Any Other Special Instructions Will Be Listed Below (If Applicable).

## 2014-09-08 NOTE — Progress Notes (Signed)
HPI [name] returns today for followup. She is a very pleasant 81 year old woman with chronic atrial fibrillation, status post permanent pacemaker insertion, status post AV nodal ablation, with a history of diastolic heart failure. In the interim, she has been stable but has been bothered by chronic back pain. She has difficulty walking because it hurts her back. She denies syncope. She has fairly mild peripheral edema. No chest pain or hemoptysis. Allergies  Allergen Reactions  . Codeine Sulfate     REACTION: unspecified  . Simvastatin     REACTION: choking, acid reflux     Current Outpatient Prescriptions  Medication Sig Dispense Refill  . ACCU-CHEK SOFTCLIX LANCETS lancets TEST once daily 100 each 12  . acetaminophen (TYLENOL) 650 MG CR tablet Take 650 mg by mouth every 8 (eight) hours as needed for pain.    . Blood Glucose Monitoring Suppl (ACCU-CHEK AVIVA PLUS) W/DEVICE KIT Use as directed 1 kit 0  . CARTIA XT 300 MG 24 hr capsule take 1 capsule by mouth once daily 90 capsule 1  . ciclopirox (PENLAC) 8 % solution Apply topically at bedtime. To toenails (Patient taking differently: Apply 1 application topically at bedtime. To toenails) 6.6 mL 1  . diazepam (VALIUM) 5 MG tablet take 1 tablet by mouth every 8 hours if needed for sleep and anxiety 60 tablet 2  . ferrous sulfate 325 (65 FE) MG tablet take 1 tablet by mouth once daily with BREAKFAST 30 tablet 5  . furosemide (LASIX) 20 MG tablet take 1 tablet by mouth once daily 90 tablet 1  . glipiZIDE (GLUCOTROL XL) 2.5 MG 24 hr tablet Take 1 tablet (2.5 mg total) by mouth daily with breakfast. 90 tablet 4  . glucose blood (ACCU-CHEK AVIVA PLUS) test strip 1 each by Other route daily as needed. 100 each 5  . hyoscyamine (LEVBID) 0.375 MG 12 hr tablet take 1 tablet by mouth twice a day ONLY AS NEEDED FOR CRAMPING    (DO NOT TAKE EVERY DAY -- MUST LAST 30 DAYS) 30 tablet 2  . levothyroxine (SYNTHROID, LEVOTHROID) 75 MCG tablet take 1  tablet by mouth once daily 90 tablet 1  . meclizine (ANTIVERT) 25 MG tablet Take 1 tablet (25 mg total) by mouth 3 (three) times daily as needed for dizziness. 30 tablet 4  . polyethylene glycol powder (GLYCOLAX/MIRALAX) powder Take 1 Container by mouth daily as needed (constipation).    . potassium chloride (K-DUR,KLOR-CON) 10 MEQ tablet take 1 tablet by mouth once daily 30 tablet 5  . simethicone (MYLICON) 073 MG chewable tablet Chew 125 mg by mouth every 6 (six) hours as needed (gas).     Marland Kitchen telmisartan (MICARDIS) 80 MG tablet take 1 tablet by mouth once daily 90 tablet 1  . warfarin (COUMADIN) 2 MG tablet TAKE AS DIRECTED BY ANTICOAGULATION CLINIC 120 tablet 1   No current facility-administered medications for this visit.     Past Medical History  Diagnosis Date  . Osteoporosis   . Hyperlipidemia   . Tremor, essential   . CHF (congestive heart failure)   . Type II or unspecified type diabetes mellitus with neurological manifestations, not stated as uncontrolled   . Hypertension   . Thyroid disease   . Diverticulosis of colon   . Atrial fibrillation   . Anxiety   . DM (diabetes mellitus)   . Pain in left ear   . Hypothyroidism   . Atrial fibrillation   . Constipation     ROS:  All systems reviewed and negative except as noted in the HPI.   Past Surgical History  Procedure Laterality Date  . Cataract extraction  2002  . Pacemaker placement    . L3 compression fraction    . Transthoracic echocardiogram  2011, 2012     Family History  Problem Relation Age of Onset  . Heart attack Father   . Colon cancer Neg Hx      History   Social History  . Marital Status: Unknown    Spouse Name: N/A  . Number of Children: N/A  . Years of Education: N/A   Occupational History  . Not on file.   Social History Main Topics  . Smoking status: Never Smoker   . Smokeless tobacco: Never Used  . Alcohol Use: No  . Drug Use: No  . Sexual Activity: Not on file   Other  Topics Concern  . Not on file   Social History Narrative     BP 114/56 mmHg  Pulse 86  Ht 5' 3" (1.6 m)  Wt 162 lb 3.2 oz (73.573 kg)  BMI 28.74 kg/m2  Physical Exam:  Well appearing elderly woman,NAD HEENT: Unremarkable Neck:  7 cm JVD, no thyromegally Back:  No CVA tenderness, mild kyphosis Lungs:  Clear with no wheezes, rales, or rhonchi. HEART:  Regular rate rhythm, no murmurs, no rubs, no clicks Abd:  soft, positive bowel sounds, no organomegally, no rebound, no guarding Ext:  2 plus pulses, no edema, no cyanosis, no clubbing Skin:  No rashes no nodules Neuro:  CN II through XII intact, motor grossly intact  ECG - atrial fibrillation with ventricular pacing at 70/min.  DEVICE  Normal device function.  See PaceArt for details.   Assess/Plan:

## 2014-09-08 NOTE — Assessment & Plan Note (Signed)
Her ventricular rate is well controlled. She is chronically in atrial fibrillation and her rate is well-controlled after AV node ablation.

## 2014-09-14 ENCOUNTER — Encounter: Payer: Self-pay | Admitting: Internal Medicine

## 2014-09-16 ENCOUNTER — Telehealth: Payer: Self-pay | Admitting: Internal Medicine

## 2014-09-16 NOTE — Telephone Encounter (Signed)
Left message for [name] to call office. Tried to contact pt no answer on home number, no voicemail.

## 2014-09-16 NOTE — Telephone Encounter (Signed)
Pt grand daughter would like to know if her grandmother can take allergy pill for watery eyes, itching throat and ear pain. Pt grand daughter would like to know what allergy med can she try and that will not interfere with her current meds.

## 2014-09-17 NOTE — Telephone Encounter (Signed)
Spoke to KeystoneAngela, told her pt can take OTC Zyrtec or Claritin and I would check with the pharmacist to see which one is best with her medications. Marylene Landngela verbalized understanding.

## 2014-09-17 NOTE — Telephone Encounter (Signed)
[name] will be home until 11 am if yo can cb. Thanks!

## 2014-09-21 ENCOUNTER — Ambulatory Visit (INDEPENDENT_AMBULATORY_CARE_PROVIDER_SITE_OTHER): Payer: Medicare Other | Admitting: General Practice

## 2014-09-21 ENCOUNTER — Other Ambulatory Visit (INDEPENDENT_AMBULATORY_CARE_PROVIDER_SITE_OTHER): Payer: Medicare Other

## 2014-09-21 DIAGNOSIS — I82419 Acute embolism and thrombosis of unspecified femoral vein: Secondary | ICD-10-CM

## 2014-09-21 DIAGNOSIS — Z5181 Encounter for therapeutic drug level monitoring: Secondary | ICD-10-CM | POA: Diagnosis not present

## 2014-09-21 LAB — POCT INR: INR: 1.8

## 2014-09-21 NOTE — Progress Notes (Signed)
Pre visit review using our clinic review tool, if applicable. No additional management support is needed unless otherwise documented below in the visit note. 

## 2014-09-22 ENCOUNTER — Other Ambulatory Visit: Payer: Self-pay | Admitting: *Deleted

## 2014-09-22 ENCOUNTER — Other Ambulatory Visit (INDEPENDENT_AMBULATORY_CARE_PROVIDER_SITE_OTHER): Payer: Medicare Other

## 2014-09-22 DIAGNOSIS — R300 Dysuria: Secondary | ICD-10-CM

## 2014-09-22 LAB — POCT URINALYSIS DIPSTICK
Bilirubin, UA: NEGATIVE
Blood, UA: NEGATIVE
Glucose, UA: NEGATIVE
KETONES UA: NEGATIVE
NITRITE UA: NEGATIVE
PH UA: 7
PROTEIN UA: NEGATIVE
Spec Grav, UA: 1.01
UROBILINOGEN UA: 0.2

## 2014-09-22 MED ORDER — CIPROFLOXACIN HCL 500 MG PO TABS: 500.0000 mg | ORAL_TABLET | Freq: Two times a day (BID) | ORAL | Status: DC

## 2014-09-30 ENCOUNTER — Other Ambulatory Visit: Payer: Self-pay | Admitting: Internal Medicine

## 2014-10-06 ENCOUNTER — Encounter: Payer: Self-pay | Admitting: Internal Medicine

## 2014-10-06 ENCOUNTER — Ambulatory Visit: Payer: Self-pay | Admitting: Internal Medicine

## 2014-10-06 ENCOUNTER — Ambulatory Visit (INDEPENDENT_AMBULATORY_CARE_PROVIDER_SITE_OTHER): Payer: Medicare Other | Admitting: Internal Medicine

## 2014-10-06 VITALS — BP 140/76 | HR 80 | Temp 98.0°F | Resp 20 | Ht 63.0 in | Wt 161.0 lb

## 2014-10-06 DIAGNOSIS — I10 Essential (primary) hypertension: Secondary | ICD-10-CM | POA: Diagnosis not present

## 2014-10-06 DIAGNOSIS — N289 Disorder of kidney and ureter, unspecified: Secondary | ICD-10-CM

## 2014-10-06 DIAGNOSIS — H9190 Unspecified hearing loss, unspecified ear: Secondary | ICD-10-CM

## 2014-10-06 DIAGNOSIS — I480 Paroxysmal atrial fibrillation: Secondary | ICD-10-CM

## 2014-10-06 DIAGNOSIS — E039 Hypothyroidism, unspecified: Secondary | ICD-10-CM

## 2014-10-06 DIAGNOSIS — E785 Hyperlipidemia, unspecified: Secondary | ICD-10-CM

## 2014-10-06 DIAGNOSIS — E0840 Diabetes mellitus due to underlying condition with diabetic neuropathy, unspecified: Secondary | ICD-10-CM

## 2014-10-06 LAB — HEMOGLOBIN A1C: Hgb A1c MFr Bld: 5.9 % (ref 4.6–6.5)

## 2014-10-06 NOTE — Progress Notes (Signed)
   Subjective:    Patient ID: [name], female    DOB: 10-25-1926, 81 y.o.   MRN: [id number]  HPI    Review of Systems     Objective:   Physical Exam        Assessment & Plan:

## 2014-10-06 NOTE — Progress Notes (Signed)
Pre visit review using our clinic review tool, if applicable. No additional management support is needed unless otherwise documented below in the visit note. 

## 2014-10-06 NOTE — Progress Notes (Signed)
Subjective:    Patient ID: [name], female    DOB: [birth year], 81 y.o.   MRN: [id number]  HPI  81 year old patient who is seen today for follow-up.  She is followed by cardiology due to atrial fibrillation.  She has essential hypertension and type 2 diabetes.  Her diabetes has been quite well controlled on oral therapy.  Her blood pressure likewise has been stable.  She has treated hypothyroidism.  Her only complaint today is some mild pedal edema.  She does have some constipation issues that were discussed.  Remains on chronic Coumadin anticoagulation  Past Medical History  Diagnosis Date  . Osteoporosis   . Hyperlipidemia   . Tremor, essential   . CHF (congestive heart failure)   . Type II or unspecified type diabetes mellitus with neurological manifestations, not stated as uncontrolled   . Hypertension   . Thyroid disease   . Diverticulosis of colon   . Atrial fibrillation   . Anxiety   . DM (diabetes mellitus)   . Pain in left ear   . Hypothyroidism   . Atrial fibrillation   . Constipation     History   Social History  . Marital Status: Unknown    Spouse Name: N/A  . Number of Children: N/A  . Years of Education: N/A   Occupational History  . Not on file.   Social History Main Topics  . Smoking status: Never Smoker   . Smokeless tobacco: Never Used  . Alcohol Use: No  . Drug Use: No  . Sexual Activity: Not on file   Other Topics Concern  . Not on file   Social History Narrative    Past Surgical History  Procedure Laterality Date  . Cataract extraction  2002  . Pacemaker placement    . L3 compression fraction    . Transthoracic echocardiogram  2011, 2012    Family History  Problem Relation Age of Onset  . Heart attack Father   . Colon cancer Neg Hx     Allergies  Allergen Reactions  . Codeine Sulfate     REACTION: unspecified  . Simvastatin     REACTION: choking, acid reflux    Current Outpatient Prescriptions on File Prior to  Visit  Medication Sig Dispense Refill  . ACCU-CHEK SOFTCLIX LANCETS lancets TEST once daily 100 each 12  . acetaminophen (TYLENOL) 650 MG CR tablet Take 650 mg by mouth every 8 (eight) hours as needed for pain.    . Blood Glucose Monitoring Suppl (ACCU-CHEK AVIVA PLUS) W/DEVICE KIT Use as directed 1 kit 0  . ciclopirox (PENLAC) 8 % solution Apply topically at bedtime. To toenails (Patient taking differently: Apply 1 application topically at bedtime. To toenails) 6.6 mL 1  . diazepam (VALIUM) 5 MG tablet take 1 tablet by mouth every 8 hours if needed for anxiety or sleep 60 tablet 2  . diltiazem (CARDIZEM CD) 300 MG 24 hr capsule take 1 capsule by mouth once daily 90 capsule 2  . ferrous sulfate 325 (65 FE) MG tablet take 1 tablet by mouth once daily with BREAKFAST 30 tablet 5  . furosemide (LASIX) 20 MG tablet take 1 tablet by mouth once daily 90 tablet 1  . GLIPIZIDE XL 2.5 MG 24 hr tablet take 1 tablet by mouth every morning with BREAKFAST 90 tablet 1  . glucose blood (ACCU-CHEK AVIVA PLUS) test strip 1 each by Other route daily as needed. 100 each 5  . hyoscyamine (LEVBID) 0.375 MG  12 hr tablet take 1 tablet by mouth twice a day ONLY AS NEEDED FOR CRAMPING    (DO NOT TAKE EVERY DAY -- MUST LAST 30 DAYS) 30 tablet 2  . levothyroxine (SYNTHROID, LEVOTHROID) 75 MCG tablet take 1 tablet by mouth once daily 90 tablet 2  . meclizine (ANTIVERT) 25 MG tablet Take 1 tablet (25 mg total) by mouth 3 (three) times daily as needed for dizziness. 30 tablet 4  . polyethylene glycol powder (GLYCOLAX/MIRALAX) powder Take 1 Container by mouth daily as needed (constipation).    . potassium chloride (K-DUR,KLOR-CON) 10 MEQ tablet take 1 tablet by mouth once daily 30 tablet 5  . simethicone (MYLICON) 229 MG chewable tablet Chew 125 mg by mouth every 6 (six) hours as needed (gas).     Marland Kitchen telmisartan (MICARDIS) 80 MG tablet take 1 tablet by mouth once daily 90 tablet 2  . warfarin (COUMADIN) 2 MG tablet TAKE AS  DIRECTED BY ANTICOAGULATION CLINIC 120 tablet 1   No current facility-administered medications on file prior to visit.    BP 140/76 mmHg  Pulse 80  Temp(Src) 98 F (36.7 C) (Oral)  Resp 20  Ht _0  (1.6 m)  Wt 161 lb (73.029 kg)  BMI 28.53 kg/m2  SpO2 96%     Review of Systems  Constitutional: Negative.   HENT: Negative for congestion, dental problem, hearing loss, rhinorrhea, sinus pressure, sore throat and tinnitus.   Eyes: Negative for pain, discharge and visual disturbance.  Respiratory: Negative for cough and shortness of breath.   Cardiovascular: Positive for leg swelling. Negative for chest pain and palpitations.  Gastrointestinal: Positive for abdominal pain and constipation. Negative for nausea, vomiting, diarrhea, blood in stool and abdominal distention.  Genitourinary: Negative for dysuria, urgency, frequency, hematuria, flank pain, vaginal bleeding, vaginal discharge, difficulty urinating, vaginal pain and pelvic pain.  Musculoskeletal: Positive for joint swelling, arthralgias and neck stiffness. Negative for gait problem.  Skin: Negative for rash.  Neurological: Negative for dizziness, syncope, speech difficulty, weakness, numbness and headaches.  Hematological: Negative for adenopathy.  Psychiatric/Behavioral: Negative for behavioral problems, dysphoric mood and agitation. The patient is not nervous/anxious.        Objective:   Physical Exam  Constitutional: She is oriented to person, place, and time. She appears well-developed and well-nourished.  HENT:  Head: Normocephalic.  Right Ear: External ear normal.  Left Ear: External ear normal.  Mouth/Throat: Oropharynx is clear and moist.  Eyes: Conjunctivae and EOM are normal. Pupils are equal, round, and reactive to light.  Neck: Normal range of motion. Neck supple. No thyromegaly present.  Cardiovascular: Normal rate, regular rhythm, normal heart sounds and intact distal pulses.   Pulmonary/Chest: Effort  normal and breath sounds normal.  Abdominal: Soft. Bowel sounds are normal. She exhibits no mass. There is no tenderness.  Musculoskeletal: Normal range of motion.  Mild pedal edema  Lymphadenopathy:    She has no cervical adenopathy.  Neurological: She is alert and oriented to person, place, and time.  Skin: Skin is warm and dry. No rash noted.  Psychiatric: She has a normal mood and affect. Her behavior is normal.          Assessment & Plan:   Essential hypertension.  Well-controlled Diabetes mellitus.  Will check a hemoglobin A1c Atrial fibrillation.  Continue Coumadin anticoagulation Chronic anticoagulation Pedal edema.  Will restrict salt, elevate.  Will observe.  Medications updated Check hemoglobin A1c Schedule CPX 6 months

## 2014-10-06 NOTE — Patient Instructions (Signed)
Limit your sodium (Salt) intake  Constipation Constipation is when a person:  Poops (has a bowel movement) less than 3 times a week.  Has a hard time pooping.  Has poop that is dry, hard, or bigger than normal. HOME CARE   Eat foods with a lot of fiber in them. This includes fruits, vegetables, beans, and whole grains such as brown rice.  Avoid fatty foods and foods with a lot of sugar. This includes french fries, hamburgers, cookies, candy, and soda.  If you are not getting enough fiber from food, take products with added fiber in them (supplements).  Drink enough fluid to keep your pee (urine) clear or pale yellow.  Exercise on a regular basis, or as told by your doctor.  Go to the restroom when you feel like you need to poop. Do not hold it.  Only take medicine as told by your doctor. Do not take medicines that help you poop (laxatives) without talking to your doctor first. GET HELP RIGHT AWAY IF:   You have bright red blood in your poop (stool).  Your constipation lasts more than 4 days or gets worse.  You have belly (abdominal) or butt (rectal) pain.  You have thin poop (as thin as a pencil).  You lose weight, and it cannot be explained. MAKE SURE YOU:   Understand these instructions.  Will watch your condition.  Will get help right away if you are not doing well or get worse. Document Released: 10/25/2007 Document Revised: 05/13/2013 Document Reviewed: 02/17/2013 [employer] Patient Information 2015 [employer]. This information is not intended to replace advice given to you by your health care provider. Make sure you discuss any questions you have with your health care provider.  

## 2014-10-26 ENCOUNTER — Ambulatory Visit (INDEPENDENT_AMBULATORY_CARE_PROVIDER_SITE_OTHER): Payer: Medicare Other | Admitting: General Practice

## 2014-10-26 ENCOUNTER — Encounter: Payer: Self-pay | Admitting: Internal Medicine

## 2014-10-26 ENCOUNTER — Ambulatory Visit (INDEPENDENT_AMBULATORY_CARE_PROVIDER_SITE_OTHER): Payer: Medicare Other | Admitting: Internal Medicine

## 2014-10-26 VITALS — BP 130/70 | HR 89 | Temp 98.5°F | Resp 20 | Ht 63.0 in | Wt 162.0 lb

## 2014-10-26 DIAGNOSIS — G252 Other specified forms of tremor: Principal | ICD-10-CM

## 2014-10-26 DIAGNOSIS — F411 Generalized anxiety disorder: Secondary | ICD-10-CM

## 2014-10-26 DIAGNOSIS — Z5181 Encounter for therapeutic drug level monitoring: Secondary | ICD-10-CM

## 2014-10-26 DIAGNOSIS — I82419 Acute embolism and thrombosis of unspecified femoral vein: Secondary | ICD-10-CM

## 2014-10-26 DIAGNOSIS — I10 Essential (primary) hypertension: Secondary | ICD-10-CM | POA: Diagnosis not present

## 2014-10-26 DIAGNOSIS — R251 Tremor, unspecified: Secondary | ICD-10-CM

## 2014-10-26 DIAGNOSIS — I480 Paroxysmal atrial fibrillation: Secondary | ICD-10-CM

## 2014-10-26 DIAGNOSIS — G250 Essential tremor: Secondary | ICD-10-CM

## 2014-10-26 LAB — POCT INR: INR: 2.1
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
## 2014-10-26 NOTE — Progress Notes (Signed)
Pre visit review using our clinic review tool, if applicable. No additional management support is needed unless otherwise documented below in the visit note. 

## 2014-10-26 NOTE — Patient Instructions (Signed)
Behavioral health referral as discussed.  Please call and make an appointment

## 2014-11-19 ENCOUNTER — Other Ambulatory Visit: Payer: Self-pay | Admitting: Internal Medicine

## 2014-11-22 ENCOUNTER — Emergency Department (HOSPITAL_COMMUNITY): Payer: Medicare Other

## 2014-11-22 ENCOUNTER — Emergency Department (HOSPITAL_COMMUNITY)
Admission: EM | Admit: 2014-11-22 | Discharge: 2014-11-22 | Disposition: A | Discharge status: Home / Self Care | Payer: Medicare Other | Attending: Emergency Medicine | Admitting: Emergency Medicine

## 2014-11-22 ENCOUNTER — Encounter (HOSPITAL_COMMUNITY): Payer: Self-pay | Admitting: Emergency Medicine

## 2014-11-22 DIAGNOSIS — S199XXA Unspecified injury of neck, initial encounter: Secondary | ICD-10-CM | POA: Insufficient documentation

## 2014-11-22 DIAGNOSIS — S3992XA Unspecified injury of lower back, initial encounter: Secondary | ICD-10-CM | POA: Insufficient documentation

## 2014-11-22 DIAGNOSIS — E039 Hypothyroidism, unspecified: Secondary | ICD-10-CM | POA: Diagnosis not present

## 2014-11-22 DIAGNOSIS — Z950 Presence of cardiac pacemaker: Secondary | ICD-10-CM | POA: Insufficient documentation

## 2014-11-22 DIAGNOSIS — K5900 Constipation, unspecified: Secondary | ICD-10-CM | POA: Diagnosis not present

## 2014-11-22 DIAGNOSIS — I10 Essential (primary) hypertension: Secondary | ICD-10-CM | POA: Insufficient documentation

## 2014-11-22 DIAGNOSIS — Y9389 Activity, other specified: Secondary | ICD-10-CM | POA: Diagnosis not present

## 2014-11-22 DIAGNOSIS — E1149 Type 2 diabetes mellitus with other diabetic neurological complication: Secondary | ICD-10-CM | POA: Insufficient documentation

## 2014-11-22 DIAGNOSIS — M542 Cervicalgia: Secondary | ICD-10-CM

## 2014-11-22 DIAGNOSIS — Y9289 Other specified places as the place of occurrence of the external cause: Secondary | ICD-10-CM | POA: Diagnosis not present

## 2014-11-22 DIAGNOSIS — Y998 Other external cause status: Secondary | ICD-10-CM | POA: Insufficient documentation

## 2014-11-22 DIAGNOSIS — I4891 Unspecified atrial fibrillation: Secondary | ICD-10-CM | POA: Insufficient documentation

## 2014-11-22 DIAGNOSIS — S79912A Unspecified injury of left hip, initial encounter: Secondary | ICD-10-CM | POA: Insufficient documentation

## 2014-11-22 DIAGNOSIS — F419 Anxiety disorder, unspecified: Secondary | ICD-10-CM | POA: Insufficient documentation

## 2014-11-22 DIAGNOSIS — M545 Low back pain: Secondary | ICD-10-CM

## 2014-11-22 DIAGNOSIS — I509 Heart failure, unspecified: Secondary | ICD-10-CM | POA: Insufficient documentation

## 2014-11-22 DIAGNOSIS — G8929 Other chronic pain: Secondary | ICD-10-CM | POA: Diagnosis not present

## 2014-11-22 DIAGNOSIS — Z7901 Long term (current) use of anticoagulants: Secondary | ICD-10-CM | POA: Diagnosis not present

## 2014-11-22 DIAGNOSIS — Z79899 Other long term (current) drug therapy: Secondary | ICD-10-CM | POA: Insufficient documentation

## 2014-11-22 DIAGNOSIS — W19XXXA Unspecified fall, initial encounter: Secondary | ICD-10-CM

## 2014-11-22 DIAGNOSIS — W1839XA Other fall on same level, initial encounter: Secondary | ICD-10-CM | POA: Insufficient documentation

## 2014-11-22 DIAGNOSIS — M25552 Pain in left hip: Secondary | ICD-10-CM

## 2014-11-22 LAB — CBC WITH DIFFERENTIAL/PLATELET
Basophils Absolute: 0 10*3/uL (ref 0.0–0.1)
Basophils Relative: 0 % (ref 0–1)
EOS PCT: 1 % (ref 0–5)
Eosinophils Absolute: 0.1 10*3/uL (ref 0.0–0.7)
HCT: 45 % (ref 36.0–46.0)
Hemoglobin: 15.5 g/dL — ABNORMAL HIGH (ref 12.0–15.0)
LYMPHS ABS: 2.2 10*3/uL (ref 0.7–4.0)
Lymphocytes Relative: 30 % (ref 12–46)
MCH: 32.6 pg (ref 26.0–34.0)
MCHC: 34.4 g/dL (ref 30.0–36.0)
MCV: 94.7 fL (ref 78.0–100.0)
Monocytes Absolute: 0.5 10*3/uL (ref 0.1–1.0)
Monocytes Relative: 6 % (ref 3–12)
NEUTROS ABS: 4.6 10*3/uL (ref 1.7–7.7)
Neutrophils Relative %: 63 % (ref 43–77)
PLATELETS: 122 10*3/uL — AB (ref 150–400)
RBC: 4.75 MIL/uL (ref 3.87–5.11)
RDW: 13.6 % (ref 11.5–15.5)
WBC: 7.3 10*3/uL (ref 4.0–10.5)

## 2014-11-22 LAB — BASIC METABOLIC PANEL
Anion gap: 11 (ref 5–15)
BUN: 19 mg/dL (ref 6–20)
CALCIUM: 9.8 mg/dL (ref 8.9–10.3)
CO2: 24 mmol/L (ref 22–32)
CREATININE: 1.2 mg/dL — AB (ref 0.44–1.00)
Chloride: 102 mmol/L (ref 101–111)
GFR, EST AFRICAN AMERICAN: 46 mL/min — AB (ref >60)
GFR, EST NON AFRICAN AMERICAN: 40 mL/min — AB (ref >60)
GLUCOSE: 121 mg/dL — AB (ref 65–99)
POTASSIUM: 3.4 mmol/L — AB (ref 3.5–5.1)
SODIUM: 137 mmol/L (ref 135–145)

## 2014-11-22 LAB — TROPONIN I

## 2014-11-22 LAB — PROTIME-INR
INR: 1.86 — AB (ref 0.00–1.49)
Prothrombin Time: 21.4 seconds — ABNORMAL HIGH (ref 11.6–15.2)
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
## 2014-11-22 NOTE — ED Provider Notes (Signed)
CSN: [id number]     Arrival date & time 11/22/14  1321 History   First MD Initiated Contact with Patient 11/22/14 1324     Chief Complaint  Patient presents with  . Fall     (Consider location/radiation/quality/duration/timing/severity/associated sxs/prior Treatment) HPI Comments: Pt comes in with c/o fall. Pt states that she was looking for her glasses and she fell backwards. Denies loc with fall. She states that he is having lower back and left hip pain. She states that she propped pillows under herself until ems came. She states that she was only on the floor for about 10 minutes as she has life alert. She states that she was told that anytime she fall she needed to be checked because of coumadin. She states that besides being sore she feels fine. Denies numbness or weakness  The history is provided by the patient and the EMS personnel. No language interpreter was used.    Past Medical History  Diagnosis Date  . Osteoporosis   . Hyperlipidemia   . Tremor, essential   . CHF (congestive heart failure)   . Type II or unspecified type diabetes mellitus with neurological manifestations, not stated as uncontrolled   . Hypertension   . Thyroid disease   . Diverticulosis of colon   . Atrial fibrillation   . Anxiety   . DM (diabetes mellitus)   . Pain in left ear   . Hypothyroidism   . Atrial fibrillation   . Constipation    Past Surgical History  Procedure Laterality Date  . Cataract extraction  2002  . Pacemaker placement    . L3 compression fraction    . Transthoracic echocardiogram  2011, 2012   Family History  Problem Relation Age of Onset  . Heart attack Father   . Colon cancer Neg Hx    History  Substance Use Topics  . Smoking status: Never Smoker   . Smokeless tobacco: Never Used  . Alcohol Use: No   OB History    No data available     Review of Systems  All other systems reviewed and are negative.     Allergies  Codeine sulfate and Simvastatin  Home  Medications   Prior to Admission medications   Medication Sig Start Date End Date Taking? Authorizing Provider  ACCU-CHEK SOFTCLIX LANCETS lancets TEST once daily 07/08/14   Marletta Lor, MD  acetaminophen (TYLENOL) 650 MG CR tablet Take 650 mg by mouth every 8 (eight) hours as needed for pain.    Historical Provider, MD  Blood Glucose Monitoring Suppl (ACCU-CHEK AVIVA PLUS) W/DEVICE KIT Use as directed 07/16/14   Lucretia Kern, DO  ciclopirox [facility]) 8 % solution Apply topically at bedtime. To toenails Patient taking differently: Apply 1 application topically at bedtime. To toenails 09/04/13   Marletta Lor, MD  diazepam (VALIUM) 5 MG tablet take 1 tablet by mouth every 8 hours if needed for anxiety or sleep 09/08/14   Marletta Lor, MD  diltiazem (CARDIZEM CD) 300 MG 24 hr capsule take 1 capsule by mouth once daily 09/30/14   Marin Olp, MD  ferrous sulfate 325 (65 FE) MG tablet take 1 tablet by mouth once daily with BREAKFAST 08/03/14   Marletta Lor, MD  furosemide (LASIX) 20 MG tablet take 1 tablet by mouth once daily 07/07/14   Marletta Lor, MD  GLIPIZIDE XL 2.5 MG 24 hr tablet take 1 tablet by mouth every morning with BREAKFAST 09/08/14   Collier Salina  Sherwood Gambler, MD  glucose blood (ACCU-CHEK AVIVA PLUS) test strip 1 each by Other route daily as needed. 06/19/14   Marletta Lor, MD  hyoscyamine (LEVBID) 0.375 MG 12 hr tablet take 1 tablet by mouth twice a day ONLY AS NEEDED FOR CRAMPING    (DO NOT TAKE EVERY DAY -- MUST LAST 30 DAYS) 07/07/14   Marletta Lor, MD  levothyroxine (SYNTHROID, LEVOTHROID) 75 MCG tablet take 1 tablet by mouth once daily 09/30/14   Marin Olp, MD  meclizine (ANTIVERT) 25 MG tablet Take 1 tablet (25 mg total) by mouth 3 (three) times daily as needed for dizziness. 04/24/13   Marletta Lor, MD  polyethylene glycol powder [facility]) powder Take 1 Container by mouth daily as needed (constipation).    Historical  Provider, MD  potassium chloride (K-DUR,KLOR-CON) 10 MEQ tablet take 1 tablet by mouth once daily 11/19/14   Marletta Lor, MD  simethicone (MYLICON) 709 MG chewable tablet Chew 125 mg by mouth every 6 (six) hours as needed (gas).     Historical Provider, MD  telmisartan (MICARDIS) 80 MG tablet take 1 tablet by mouth once daily 09/30/14   Marin Olp, MD  warfarin (COUMADIN) 2 MG tablet TAKE AS DIRECTED BY ANTICOAGULATION CLINIC 06/01/14   Kennyth Arnold, FNP   BP 146/71 mmHg  Pulse 75  Temp(Src) 98.6 F (37 C) (Oral)  Resp 25  Ht $R'5\' 3"'Ja$  (1.6 m)  Wt 158 lb (71.668 kg)  BMI 28.00 kg/m2  SpO2 100% Physical Exam  Constitutional: She appears well-developed and well-nourished.  HENT:  Head: Normocephalic and atraumatic.  Right Ear: External ear normal.  Left Ear: External ear normal.  Cardiovascular: Normal rate and regular rhythm.   Pulmonary/Chest: Effort normal and breath sounds normal.  Abdominal: Soft. Bowel sounds are normal. There is no tenderness.  Musculoskeletal:       Cervical back: She exhibits bony tenderness.       Thoracic back: Normal.       Lumbar back: She exhibits bony tenderness.  Tender on the lateral left hip. No shortening or rotation noted.  Nursing note and vitals reviewed.   ED Course  Procedures (including critical care time) Labs Review Labs Reviewed  BASIC METABOLIC PANEL - Abnormal; Notable for the following:    Potassium 3.4 (*)    Glucose, Bld 121 (*)    Creatinine, Ser 1.20 (*)    GFR calc non Af Amer 40 (*)    GFR calc Af Amer 46 (*)    All other components within normal limits  CBC WITH DIFFERENTIAL/PLATELET - Abnormal; Notable for the following:    Hemoglobin 15.5 (*)    Platelets 122 (*)    All other components within normal limits  PROTIME-INR - Abnormal; Notable for the following:    Prothrombin Time 21.4 (*)    INR 1.86 (*)    All other components within normal limits  TROPONIN I    Imaging Review Dg Chest 1  View  11/22/2014   CLINICAL DATA:  fell on her back. Propped pillows under herself and called EMS. History of broken back. Reports neck, back, and left hip pain. Collared and boarded on arrival. Denies LOC. EMS states house overcrowded with items  EXAM: CHEST  1 VIEW  COMPARISON:  03/19/2014  FINDINGS: Lungs are clear. Stable cardiomegaly. Left subclavian pacemaker stable. No pneumothorax. No effusion. Visualized skeletal structures are unremarkable.  IMPRESSION: Stable cardiomegaly.  No acute disease.   Electronically Signed  By: Lucrezia Europe M.D.   On: 11/22/2014 14:45   Dg Lumbar Spine Complete  11/22/2014   CLINICAL DATA:  fell on her back. Propped pillows under herself and called EMS. History of broken back. Reports neck, back, and left hip pain. Collared and boarded on arrival. Denies LOC. EMS states house overcrowded with items  EXAM: LUMBAR SPINE - COMPLETE 4+ VIEW  COMPARISON:  02/15/2010  FINDINGS: Old superior endplate compression deformity of L3, stable. No acute fracture. Normal alignment. Disc heights maintained throughout. Extensive aortic calcifications without suggestion of aneurysm.  IMPRESSION: 1. No acute bone abnormality. 2. Old L3 compression fracture deformity.   Electronically Signed   By: Lucrezia Europe M.D.   On: 11/22/2014 14:47   Dg Hip Unilat With Pelvis 2-3 Views Left  11/22/2014   CLINICAL DATA:  fell on her back. Propped pillows under herself and called EMS. History of broken back. Reports neck, back, and left hip pain. Collared and boarded on arrival. Denies LOC. EMS states house overcrowded with items  EXAM: LEFT HIP (WITH PELVIS) 2-3 VIEWS  COMPARISON:  CT 10/07/2006  FINDINGS: The femoral neck is not well profiled. There is no evidence of hip fracture or dislocation. There is no evidence of arthropathy or other focal bone abnormality. Bilateral pelvic phleboliths.  IMPRESSION: Negative limited study. If symptoms persist, consider MR to assess for occult fracture.   Electronically  Signed   By: Lucrezia Europe M.D.   On: 11/22/2014 14:44     EKG Interpretation None      MDM   Final diagnoses:  Fall, initial encounter  Hip pain, left  Chronic lower back pain  Neck pain    No acute injury noted on x-ray. Pt is neurologically intact. Likely mechanical fall. Think pt is okay to go home. Family and pt in agreement with plan   Glendell Docker, NP 11/22/14 1551  Evelina Bucy, MD 11/22/14 1556

## 2014-11-22 NOTE — ED Notes (Signed)
[name] [phone]) niece called and said she is POA legally for patient. Stated the daughter in the room is from out of town and is not to make any decisions. Family discord present.

## 2014-11-22 NOTE — Discharge Instructions (Signed)

## 2014-11-22 NOTE — ED Notes (Signed)
Patient transported to CT 

## 2014-11-22 NOTE — ED Provider Notes (Signed)
EKG: paced, rate of 70. Similar to previous morphology.   Maria MochaBlair Brystal Kildow, MD 11/22/14 [id number]

## 2014-11-22 NOTE — ED Notes (Signed)
Patient returned from Radiology. 

## 2014-11-26 ENCOUNTER — Ambulatory Visit (INDEPENDENT_AMBULATORY_CARE_PROVIDER_SITE_OTHER): Payer: Medicare Other | Admitting: General Practice

## 2014-11-26 DIAGNOSIS — I4891 Unspecified atrial fibrillation: Secondary | ICD-10-CM

## 2014-11-26 DIAGNOSIS — I82419 Acute embolism and thrombosis of unspecified femoral vein: Secondary | ICD-10-CM

## 2014-11-26 DIAGNOSIS — Z5181 Encounter for therapeutic drug level monitoring: Secondary | ICD-10-CM

## 2014-11-26 LAB — POCT INR: INR: 2.2

## 2014-11-26 NOTE — Progress Notes (Signed)
Pre visit review using our clinic review tool, if applicable. No additional management support is needed unless otherwise documented below in the visit note. 

## 2014-12-08 ENCOUNTER — Ambulatory Visit (INDEPENDENT_AMBULATORY_CARE_PROVIDER_SITE_OTHER): Payer: Medicare Other | Admitting: *Deleted

## 2014-12-08 ENCOUNTER — Encounter: Payer: Self-pay | Admitting: Internal Medicine

## 2014-12-08 ENCOUNTER — Ambulatory Visit (INDEPENDENT_AMBULATORY_CARE_PROVIDER_SITE_OTHER): Payer: Medicare Other | Admitting: Internal Medicine

## 2014-12-08 VITALS — BP 132/60 | HR 71 | Temp 98.4°F | Resp 20 | Ht 63.0 in | Wt 167.0 lb

## 2014-12-08 DIAGNOSIS — Z7901 Long term (current) use of anticoagulants: Secondary | ICD-10-CM

## 2014-12-08 DIAGNOSIS — I482 Chronic atrial fibrillation, unspecified: Secondary | ICD-10-CM

## 2014-12-08 DIAGNOSIS — E039 Hypothyroidism, unspecified: Secondary | ICD-10-CM | POA: Diagnosis not present

## 2014-12-08 DIAGNOSIS — I481 Persistent atrial fibrillation: Secondary | ICD-10-CM

## 2014-12-08 DIAGNOSIS — I10 Essential (primary) hypertension: Secondary | ICD-10-CM

## 2014-12-08 DIAGNOSIS — Z23 Encounter for immunization: Secondary | ICD-10-CM | POA: Diagnosis not present

## 2014-12-08 DIAGNOSIS — E0840 Diabetes mellitus due to underlying condition with diabetic neuropathy, unspecified: Secondary | ICD-10-CM | POA: Diagnosis not present

## 2014-12-08 DIAGNOSIS — E785 Hyperlipidemia, unspecified: Secondary | ICD-10-CM

## 2014-12-08 DIAGNOSIS — I4819 Other persistent atrial fibrillation: Secondary | ICD-10-CM

## 2014-12-08 LAB — CUP PACEART INCLINIC DEVICE CHECK
Lead Channel Pacing Threshold Amplitude: 0.75 V
Lead Channel Pacing Threshold Pulse Width: 0.4 ms
Lead Channel Setting Pacing Pulse Width: 0.4 ms
Lead Channel Setting Sensing Sensitivity: 1.4 mV
MDC IDC MSMT BATTERY IMPEDANCE: 667 Ohm
MDC IDC MSMT BATTERY REMAINING LONGEVITY: 69 mo
MDC IDC MSMT BATTERY VOLTAGE: 2.78 V
MDC IDC MSMT LEADCHNL RA IMPEDANCE VALUE: 67 Ohm
MDC IDC MSMT LEADCHNL RV IMPEDANCE VALUE: 661 Ohm
MDC IDC MSMT LEADCHNL RV SENSING INTR AMPL: 11.2 mV
MDC IDC SESS DTM: 20160719094454
MDC IDC SET LEADCHNL RV PACING AMPLITUDE: 2.5 V
MDC IDC STAT BRADY RV PERCENT PACED: 100 %

## 2014-12-08 MED ORDER — METFORMIN HCL ER 500 MG PO TB24: 500.0000 mg | ORAL_TABLET | Freq: Every day | ORAL | Status: DC

## 2014-12-08 NOTE — Progress Notes (Signed)
Pre visit review using our clinic review tool, if applicable. No additional management support is needed unless otherwise documented below in the visit note. 

## 2014-12-08 NOTE — Progress Notes (Signed)
Subjective:    Patient ID: [name], female    DOB: 05/06/1927, 81 y.o.   MRN: [id number]  HPI  Lab Results  Component Value Date   HGBA1C 5.9 10/06/2014   81 year old patient who has a history of type 2 diabetes.  She has been on SU therapy and hemoglobin A1c has been low.  There has been some possible mild hypoglycemia, but none documented.  Unclear why she is not on metformin therapy.  She does not recall any intolerance  Her anxiety is much improved since she has had very little contact with the a granddaughter She has essential hypertension and atrial fibrillation. Remains on Coumadin anticoagulation  Forms for [facility] completed  Past Medical History  Diagnosis Date  . Osteoporosis   . Hyperlipidemia   . Tremor, essential   . CHF (congestive heart failure)   . Type II or unspecified type diabetes mellitus with neurological manifestations, not stated as uncontrolled   . Hypertension   . Thyroid disease   . Diverticulosis of colon   . Atrial fibrillation   . Anxiety   . DM (diabetes mellitus)   . Pain in left ear   . Hypothyroidism   . Atrial fibrillation   . Constipation     History   Social History  . Marital Status: Unknown    Spouse Name: N/A  . Number of Children: N/A  . Years of Education: N/A   Occupational History  . Not on file.   Social History Main Topics  . Smoking status: Never Smoker   . Smokeless tobacco: Never Used  . Alcohol Use: No  . Drug Use: No  . Sexual Activity: Not on file   Other Topics Concern  . Not on file   Social History Narrative    Past Surgical History  Procedure Laterality Date  . Cataract extraction  2002  . Pacemaker placement    . L3 compression fraction    . Transthoracic echocardiogram  2011, 2012    Family History  Problem Relation Age of Onset  . Heart attack Father   . Colon cancer Neg Hx     Allergies  Allergen Reactions  . Codeine Sulfate     REACTION: unspecified  . Simvastatin    REACTION: choking, acid reflux    Current Outpatient Prescriptions on File Prior to Visit  Medication Sig Dispense Refill  . ACCU-CHEK SOFTCLIX LANCETS lancets TEST once daily 100 each 12  . acetaminophen (TYLENOL) 650 MG CR tablet Take 650 mg by mouth every 8 (eight) hours as needed for pain.    . Blood Glucose Monitoring Suppl (ACCU-CHEK AVIVA PLUS) W/DEVICE KIT Use as directed 1 kit 0  . ciclopirox (PENLAC) 8 % solution Apply topically at bedtime. To toenails (Patient taking differently: Apply 1 application topically at bedtime. To toenails) 6.6 mL 1  . diazepam (VALIUM) 5 MG tablet take 1 tablet by mouth every 8 hours if needed for anxiety or sleep 60 tablet 2  . diltiazem (CARDIZEM CD) 300 MG 24 hr capsule take 1 capsule by mouth once daily 90 capsule 2  . ferrous sulfate 325 (65 FE) MG tablet take 1 tablet by mouth once daily with BREAKFAST 30 tablet 5  . furosemide (LASIX) 20 MG tablet take 1 tablet by mouth once daily 90 tablet 1  . GLIPIZIDE XL 2.5 MG 24 hr tablet take 1 tablet by mouth every morning with BREAKFAST 90 tablet 1  . glucose blood (ACCU-CHEK AVIVA PLUS) test strip  1 each by Other route daily as needed. 100 each 5  . hyoscyamine (LEVBID) 0.375 MG 12 hr tablet take 1 tablet by mouth twice a day ONLY AS NEEDED FOR CRAMPING    (DO NOT TAKE EVERY DAY -- MUST LAST 30 DAYS) 30 tablet 2  . levothyroxine (SYNTHROID, LEVOTHROID) 75 MCG tablet take 1 tablet by mouth once daily 90 tablet 2  . meclizine (ANTIVERT) 25 MG tablet Take 1 tablet (25 mg total) by mouth 3 (three) times daily as needed for dizziness. 30 tablet 4  . polyethylene glycol powder (GLYCOLAX/MIRALAX) powder Take 1 Container by mouth daily as needed (constipation).    . potassium chloride (K-DUR,KLOR-CON) 10 MEQ tablet take 1 tablet by mouth once daily 30 tablet 5  . simethicone (MYLICON) 937 MG chewable tablet Chew 125 mg by mouth every 6 (six) hours as needed (gas).     Marland Kitchen telmisartan (MICARDIS) 80 MG tablet take 1  tablet by mouth once daily 90 tablet 2  . warfarin (COUMADIN) 2 MG tablet TAKE AS DIRECTED BY ANTICOAGULATION CLINIC 120 tablet 1   No current facility-administered medications on file prior to visit.    BP 132/60 mmHg  Pulse 71  Temp(Src) 98.4 F (36.9 C) (Oral)  Resp 20  Ht _0  (1.6 m)  Wt 167 lb (75.751 kg)  BMI 29.59 kg/m2  SpO2 96%     Review of Systems  Constitutional: Negative.   HENT: Negative for congestion, dental problem, hearing loss, rhinorrhea, sinus pressure, sore throat and tinnitus.   Eyes: Negative for pain, discharge and visual disturbance.  Respiratory: Negative for cough and shortness of breath.   Cardiovascular: Negative for chest pain, palpitations and leg swelling.  Gastrointestinal: Negative for nausea, vomiting, abdominal pain, diarrhea, constipation, blood in stool and abdominal distention.  Genitourinary: Negative for dysuria, urgency, frequency, hematuria, flank pain, vaginal bleeding, vaginal discharge, difficulty urinating, vaginal pain and pelvic pain.  Musculoskeletal: Positive for back pain and gait problem. Negative for joint swelling and arthralgias.  Skin: Negative for rash.  Neurological: Negative for dizziness, syncope, speech difficulty, weakness, numbness and headaches.  Hematological: Negative for adenopathy.  Psychiatric/Behavioral: Negative for behavioral problems, dysphoric mood and agitation. The patient is not nervous/anxious.        Objective:   Physical Exam  Constitutional: She is oriented to person, place, and time. She appears well-developed and well-nourished.  HENT:  Head: Normocephalic.  Right Ear: External ear normal.  Left Ear: External ear normal.  Mouth/Throat: Oropharynx is clear and moist.  Eyes: Conjunctivae and EOM are normal. Pupils are equal, round, and reactive to light.  Neck: Normal range of motion. Neck supple. No thyromegaly present.  Cardiovascular: Normal rate, normal heart sounds and intact distal  pulses.   Pulmonary/Chest: Effort normal and breath sounds normal.  Abdominal: Soft. Bowel sounds are normal. She exhibits no mass. There is no tenderness.  Musculoskeletal: Normal range of motion.  Lymphadenopathy:    She has no cervical adenopathy.  Neurological: She is alert and oriented to person, place, and time.  Skin: Skin is warm and dry. No rash noted.  Psychiatric: She has a normal mood and affect. Her behavior is normal.          Assessment & Plan:   Hypertension, well-controlled Diabetes.  Will discontinue glipizide and substitute the metformin Atrial fibrillation Renal insufficiency ( GFR 45-50)

## 2014-12-08 NOTE — Progress Notes (Signed)
Pacemaker check in clinic. Normal device function. Threshold, sensing, and impedance consistent with previous measurements. Device programmed to maximize longevity. Permanent AF + Warfarin. No high ventricular rates noted. Device programmed at appropriate safety margins. Histogram distribution appropriate for patient activity level. Device programmed to optimize intrinsic conduction. Estimated longevity 5.5 years. Patient will follow up via [facility] on 10/18 and with [facility] in 08-2015.

## 2014-12-08 NOTE — Patient Instructions (Signed)
Discontinue glipizide  Return in 3 months for follow-up

## 2014-12-17 ENCOUNTER — Telehealth: Payer: Self-pay | Admitting: Internal Medicine

## 2014-12-17 NOTE — Telephone Encounter (Signed)
New Message      Pt calling about her diagnosis', she wants to know if she has heart failure. Please call back and advise.

## 2014-12-17 NOTE — Telephone Encounter (Signed)
She has diastolic heart failure but is well compensated.  She keeps getting letters from [facility] asking her to talk to her MD about BB therapy.  These letters are unnerving to her.  She is going to keep her follow ups with Dr Ladona Ridgel and call if she needs anything

## 2014-12-24 ENCOUNTER — Other Ambulatory Visit: Payer: Self-pay | Admitting: General Practice

## 2014-12-24 ENCOUNTER — Ambulatory Visit (INDEPENDENT_AMBULATORY_CARE_PROVIDER_SITE_OTHER): Payer: Medicare Other | Admitting: General Practice

## 2014-12-24 DIAGNOSIS — I82419 Acute embolism and thrombosis of unspecified femoral vein: Secondary | ICD-10-CM | POA: Diagnosis not present

## 2014-12-24 DIAGNOSIS — Z5181 Encounter for therapeutic drug level monitoring: Secondary | ICD-10-CM

## 2014-12-24 DIAGNOSIS — I4891 Unspecified atrial fibrillation: Secondary | ICD-10-CM

## 2014-12-24 LAB — POCT INR: INR: 1.5

## 2014-12-24 MED ORDER — WARFARIN SODIUM 2 MG PO TABS: ORAL_TABLET | ORAL | Status: DC

## 2014-12-24 NOTE — Progress Notes (Signed)
Pre visit review using our clinic review tool, if applicable. No additional management support is needed unless otherwise documented below in the visit note. 

## 2014-12-25 ENCOUNTER — Other Ambulatory Visit: Payer: Self-pay | Admitting: Internal Medicine

## 2014-12-28 ENCOUNTER — Telehealth: Payer: Self-pay | Admitting: *Deleted

## 2014-12-28 NOTE — Telephone Encounter (Signed)
-----   Message from Garrison Columbus, RN sent at 12/24/2014 11:28 AM EDT ----- I just checked patient in coumadin clinic and she is requesting a re-fill of valium.  She states that her daughter passed this past week and they buried her yesterday.  She is very upset today.  Bailey Mech

## 2014-12-28 NOTE — Telephone Encounter (Signed)
Left message on voicemail to call office. Rx was phoned into pharmacy on 8/5.

## 2014-12-29 ENCOUNTER — Other Ambulatory Visit: Payer: Self-pay | Admitting: Internal Medicine

## 2014-12-30 ENCOUNTER — Encounter: Payer: Self-pay | Admitting: Internal Medicine

## 2015-01-07 ENCOUNTER — Ambulatory Visit: Payer: Medicare Other

## 2015-01-14 ENCOUNTER — Ambulatory Visit (INDEPENDENT_AMBULATORY_CARE_PROVIDER_SITE_OTHER): Payer: Medicare Other | Admitting: General Practice

## 2015-01-14 DIAGNOSIS — I4891 Unspecified atrial fibrillation: Secondary | ICD-10-CM | POA: Diagnosis not present

## 2015-01-14 DIAGNOSIS — I82419 Acute embolism and thrombosis of unspecified femoral vein: Secondary | ICD-10-CM

## 2015-01-14 DIAGNOSIS — Z5181 Encounter for therapeutic drug level monitoring: Secondary | ICD-10-CM | POA: Diagnosis not present

## 2015-01-14 LAB — POCT INR: INR: 2.6

## 2015-01-14 NOTE — Progress Notes (Signed)
Pre visit review using our clinic review tool, if applicable. No additional management support is needed unless otherwise documented below in the visit note. 

## 2015-02-10 ENCOUNTER — Ambulatory Visit: Payer: Medicare Other

## 2015-02-11 ENCOUNTER — Encounter: Payer: Self-pay | Admitting: Internal Medicine

## 2015-02-11 ENCOUNTER — Ambulatory Visit (INDEPENDENT_AMBULATORY_CARE_PROVIDER_SITE_OTHER): Payer: Medicare Other | Admitting: Internal Medicine

## 2015-02-11 ENCOUNTER — Ambulatory Visit (INDEPENDENT_AMBULATORY_CARE_PROVIDER_SITE_OTHER): Payer: Medicare Other | Admitting: General Practice

## 2015-02-11 VITALS — BP 140/70 | HR 75 | Temp 98.7°F | Resp 20 | Ht 63.0 in | Wt 158.0 lb

## 2015-02-11 DIAGNOSIS — Z23 Encounter for immunization: Secondary | ICD-10-CM

## 2015-02-11 DIAGNOSIS — E039 Hypothyroidism, unspecified: Secondary | ICD-10-CM | POA: Diagnosis not present

## 2015-02-11 DIAGNOSIS — I10 Essential (primary) hypertension: Secondary | ICD-10-CM | POA: Diagnosis not present

## 2015-02-11 DIAGNOSIS — Z7901 Long term (current) use of anticoagulants: Secondary | ICD-10-CM

## 2015-02-11 DIAGNOSIS — N289 Disorder of kidney and ureter, unspecified: Secondary | ICD-10-CM

## 2015-02-11 DIAGNOSIS — I82419 Acute embolism and thrombosis of unspecified femoral vein: Secondary | ICD-10-CM | POA: Diagnosis not present

## 2015-02-11 DIAGNOSIS — Z5181 Encounter for therapeutic drug level monitoring: Secondary | ICD-10-CM

## 2015-02-11 DIAGNOSIS — E1140 Type 2 diabetes mellitus with diabetic neuropathy, unspecified: Secondary | ICD-10-CM | POA: Diagnosis not present

## 2015-02-11 DIAGNOSIS — I480 Paroxysmal atrial fibrillation: Secondary | ICD-10-CM

## 2015-02-11 LAB — POCT INR: INR: 3

## 2015-02-11 NOTE — Progress Notes (Signed)
Subjective:    Patient ID: [name], female    DOB: [birth year], 81 y.o.   MRN: [id number]  HPI   81 year old patient who has multiple medical issues including hypertension, atrial fibrillation on chronic Coumadin anticoagulation.  She has been under considerable situational stress.  Since the death of a daughter about 6 weeks ago.  Over this period time.  She has noted intermittent diarrhea and intermittent lower abdominal pain.  No nausea or vomiting.  Her appetite remains well maintained. She has heart failure, which has been stable.  She is accompanied by 2 daughters today  Wt Readings from Last 3 Encounters:  02/11/15 158 lb (71.668 kg)  12/08/14 167 lb (75.751 kg)  11/22/14 158 lb (71.668 kg)   Lab Results  Component Value Date   HGBA1C 5.9 10/06/2014    Past Medical History  Diagnosis Date  . Osteoporosis   . Hyperlipidemia   . Tremor, essential   . CHF (congestive heart failure)   . Type II or unspecified type diabetes mellitus with neurological manifestations, not stated as uncontrolled   . Hypertension   . Thyroid disease   . Diverticulosis of colon   . Atrial fibrillation   . Anxiety   . DM (diabetes mellitus)   . Pain in left ear   . Hypothyroidism   . Atrial fibrillation   . Constipation     Social History   Social History  . Marital Status: Unknown    Spouse Name: N/A  . Number of Children: N/A  . Years of Education: N/A   Occupational History  . Not on file.   Social History Main Topics  . Smoking status: Never Smoker   . Smokeless tobacco: Never Used  . Alcohol Use: No  . Drug Use: No  . Sexual Activity: Not on file   Other Topics Concern  . Not on file   Social History Narrative    Past Surgical History  Procedure Laterality Date  . Cataract extraction  2002  . Pacemaker placement    . L3 compression fraction    . Transthoracic echocardiogram  2011, 2012    Family History  Problem Relation Age of Onset  . Heart attack  Father   . Colon cancer Neg Hx     Allergies  Allergen Reactions  . Codeine Sulfate     REACTION: unspecified  . Simvastatin     REACTION: choking, acid reflux    Current Outpatient Prescriptions on File Prior to Visit  Medication Sig Dispense Refill  . ACCU-CHEK SOFTCLIX LANCETS lancets TEST once daily 100 each 12  . acetaminophen (TYLENOL) 650 MG CR tablet Take 650 mg by mouth every 8 (eight) hours as needed for pain.    . Blood Glucose Monitoring Suppl (ACCU-CHEK AVIVA PLUS) W/DEVICE KIT Use as directed 1 kit 0  . ciclopirox (PENLAC) 8 % solution Apply topically at bedtime. To toenails (Patient taking differently: Apply 1 application topically at bedtime. To toenails) 6.6 mL 1  . diazepam (VALIUM) 5 MG tablet take 1 tablet by mouth every 8 hours if needed for anxiety or sleep 60 tablet 2  . diltiazem (CARDIZEM CD) 300 MG 24 hr capsule take 1 capsule by mouth once daily 90 capsule 2  . ferrous sulfate 325 (65 FE) MG tablet take 1 tablet by mouth once daily with BREAKFAST 30 tablet 5  . furosemide (LASIX) 20 MG tablet take 1 tablet by mouth once daily 90 tablet 1  . glucose blood (ACCU-CHEK  AVIVA PLUS) test strip 1 each by Other route daily as needed. 100 each 5  . hyoscyamine (LEVBID) 0.375 MG 12 hr tablet take 1 tablet by mouth twice a day ONLY IF NEEDED, **DO NOT TAKE EVERYDAY MUST LAST 30 DAYS** 30 tablet 2  . levothyroxine (SYNTHROID, LEVOTHROID) 75 MCG tablet take 1 tablet by mouth once daily 90 tablet 2  . meclizine (ANTIVERT) 25 MG tablet Take 1 tablet (25 mg total) by mouth 3 (three) times daily as needed for dizziness. 30 tablet 4  . metFORMIN (GLUCOPHAGE-XR) 500 MG 24 hr tablet Take 1 tablet (500 mg total) by mouth daily with breakfast. 90 tablet 3  . polyethylene glycol powder (GLYCOLAX/MIRALAX) powder Take 1 Container by mouth daily as needed (constipation).    . potassium chloride (K-DUR,KLOR-CON) 10 MEQ tablet take 1 tablet by mouth once daily 30 tablet 5  . simethicone  (MYLICON) 161 MG chewable tablet Chew 125 mg by mouth every 6 (six) hours as needed (gas).     Marland Kitchen telmisartan (MICARDIS) 80 MG tablet take 1 tablet by mouth once daily 90 tablet 2  . warfarin (COUMADIN) 2 MG tablet Take as directed by anticoagulation clinic 90 tablet 1   No current facility-administered medications on file prior to visit.    BP 140/70 mmHg  Pulse 75  Temp(Src) 98.7 F (37.1 C) (Oral)  Resp 20  Ht 5' 3" (1.6 m)  Wt 158 lb (71.668 kg)  BMI 28.00 kg/m2  SpO2 97%    Review of Systems  Constitutional: Negative.   HENT: Negative for congestion, dental problem, hearing loss, rhinorrhea, sinus pressure, sore throat and tinnitus.   Eyes: Negative for pain, discharge and visual disturbance.  Respiratory: Negative for cough and shortness of breath.   Cardiovascular: Negative for chest pain, palpitations and leg swelling.  Gastrointestinal: Positive for abdominal pain and diarrhea. Negative for nausea, vomiting, constipation, blood in stool and abdominal distention.  Genitourinary: Negative for dysuria, urgency, frequency, hematuria, flank pain, vaginal bleeding, vaginal discharge, difficulty urinating, vaginal pain and pelvic pain.  Musculoskeletal: Negative for joint swelling, arthralgias and gait problem.  Skin: Negative for rash.  Neurological: Negative for dizziness, syncope, speech difficulty, weakness, numbness and headaches.  Hematological: Negative for adenopathy.  Psychiatric/Behavioral: Negative for behavioral problems, dysphoric mood and agitation. The patient is not nervous/anxious.        Objective:   Physical Exam  Constitutional: She is oriented to person, place, and time. She appears well-developed and well-nourished.  HENT:  Head: Normocephalic.  Right Ear: External ear normal.  Left Ear: External ear normal.  Mouth/Throat: Oropharynx is clear and moist.  Eyes: Conjunctivae and EOM are normal. Pupils are equal, round, and reactive to light.  Neck:  Normal range of motion. Neck supple. No thyromegaly present.  Cardiovascular: Normal rate, regular rhythm, normal heart sounds and intact distal pulses.   Pulmonary/Chest: Effort normal and breath sounds normal.  Abdominal: Soft. Bowel sounds are normal. She exhibits no distension and no mass. There is no tenderness. There is no rebound and no guarding.  Shotty right femoral adenopathy  Musculoskeletal: Normal range of motion.  Lymphadenopathy:    She has no cervical adenopathy.  Neurological: She is alert and oriented to person, place, and time.  Skin: Skin is warm and dry. No rash noted.  Psychiatric: She has a normal mood and affect. Her behavior is normal.          Assessment & Plan:   Situational stress Nonspecific abdominal pain/diarrhea Paroxysmal atrial  fibrillation Diabetes mellitus.  Will check a follow-up hemoglobin A1c Essential hypertension.  Well-controlled  Options discussed with patient and her 2 daughters.  Patient has follow-up exam next month.  We'll reassess at that time.  Patient will report any new or worsening symptoms

## 2015-02-11 NOTE — Progress Notes (Signed)
Pre visit review using our clinic review tool, if applicable. No additional management support is needed unless otherwise documented below in the visit note. 

## 2015-02-11 NOTE — Patient Instructions (Signed)
Behavioral health referral as discussed   Return as scheduled in October  Limit your sodium (Salt) intake   Discontinue glipizide (glucotrol)   Please check your hemoglobin A1c every 3 months

## 2015-02-25 ENCOUNTER — Other Ambulatory Visit: Payer: Self-pay | Admitting: Internal Medicine

## 2015-02-26 ENCOUNTER — Telehealth: Payer: Self-pay | Admitting: Internal Medicine

## 2015-02-26 ENCOUNTER — Ambulatory Visit: Payer: Medicare Other | Admitting: Psychology

## 2015-02-26 NOTE — Telephone Encounter (Signed)
Notify daughter that may be reasonable to file a police complaint; no further Valium will be prescribed by this office

## 2015-02-26 NOTE — Telephone Encounter (Signed)
FYI

## 2015-02-26 NOTE — Telephone Encounter (Addendum)
Pt and daughter had appointment with/ allison bray today. Pt is having a hard time dealing with one of  her's daughter death. And for this issue, dr Kirtland Bouchard has prescribed valium. A known felon w/ an ankle bracelet on who is her direct neighbor has befriended her mother,and can get to her mothers home and has been taking her valium. Takes them as "payment" for taking out her trash, or whatever she can get away with.  Mom will not listen to her kids and tells them to mind their business. This daughter is getting on a plane today to go back home and does not know what to do. She fears for the patient.(mom) Would like to know what she should do? Should she call police?unfortunaltly this daughter is not on dpr.

## 2015-03-01 NOTE — Telephone Encounter (Signed)
Left detailed message on [name]'s personal voicemail who is on the [facility]. Told her Dr.K said it would be reasonable for you to file a police complaint and no further Valium will be prescribed by this office. Any questions please call the office.

## 2015-03-02 ENCOUNTER — Encounter: Payer: Self-pay | Admitting: Internal Medicine

## 2015-03-14 ENCOUNTER — Encounter: Payer: Self-pay | Admitting: Internal Medicine

## 2015-03-15 ENCOUNTER — Encounter: Payer: Medicare Other | Admitting: *Deleted

## 2015-03-15 ENCOUNTER — Telehealth: Payer: Self-pay | Admitting: Cardiology

## 2015-03-15 ENCOUNTER — Encounter: Payer: Self-pay | Admitting: Internal Medicine

## 2015-03-15 NOTE — Telephone Encounter (Signed)
Spoke with pt and reminded pt of remote transmission that is due today. Pt verbalized understanding.   

## 2015-03-16 ENCOUNTER — Encounter: Payer: Self-pay | Admitting: Cardiology

## 2015-03-18 ENCOUNTER — Ambulatory Visit: Payer: Medicare Other

## 2015-03-19 ENCOUNTER — Ambulatory Visit (INDEPENDENT_AMBULATORY_CARE_PROVIDER_SITE_OTHER): Payer: Medicare Other | Admitting: *Deleted

## 2015-03-19 DIAGNOSIS — I4891 Unspecified atrial fibrillation: Secondary | ICD-10-CM | POA: Diagnosis not present

## 2015-03-19 NOTE — Progress Notes (Signed)
Remote pacemaker transmission.   

## 2015-03-22 ENCOUNTER — Encounter: Payer: Self-pay | Admitting: Internal Medicine

## 2015-03-22 ENCOUNTER — Ambulatory Visit (INDEPENDENT_AMBULATORY_CARE_PROVIDER_SITE_OTHER): Payer: Medicare Other | Admitting: Internal Medicine

## 2015-03-22 ENCOUNTER — Ambulatory Visit (INDEPENDENT_AMBULATORY_CARE_PROVIDER_SITE_OTHER): Payer: Medicare Other | Admitting: General Practice

## 2015-03-22 VITALS — BP 150/80 | HR 94 | Temp 98.0°F | Resp 20 | Ht 63.0 in | Wt 157.0 lb

## 2015-03-22 DIAGNOSIS — I10 Essential (primary) hypertension: Secondary | ICD-10-CM | POA: Diagnosis not present

## 2015-03-22 DIAGNOSIS — I82419 Acute embolism and thrombosis of unspecified femoral vein: Secondary | ICD-10-CM

## 2015-03-22 DIAGNOSIS — E0840 Diabetes mellitus due to underlying condition with diabetic neuropathy, unspecified: Secondary | ICD-10-CM | POA: Diagnosis not present

## 2015-03-22 DIAGNOSIS — E785 Hyperlipidemia, unspecified: Secondary | ICD-10-CM

## 2015-03-22 DIAGNOSIS — E039 Hypothyroidism, unspecified: Secondary | ICD-10-CM

## 2015-03-22 DIAGNOSIS — Z5181 Encounter for therapeutic drug level monitoring: Secondary | ICD-10-CM

## 2015-03-22 LAB — POCT INR: INR: 1

## 2015-03-22 NOTE — Progress Notes (Signed)
Pre visit review using our clinic review tool, if applicable. No additional management support is needed unless otherwise documented below in the visit note. 

## 2015-03-22 NOTE — Progress Notes (Signed)
Subjective:    Patient ID: [name], female    DOB: 02/20/1927, 81 y.o.   MRN: [id number]  HPI  Lab Results  Component Value Date   HGBA1C 5.9 10/06/2014     Wt Readings from Last 3 Encounters:  03/22/15 157 lb (71.215 kg)  02/11/15 158 lb (71.668 kg)  12/08/14 167 lb (75.751 kg)    BP Readings from Last 3 Encounters:  03/22/15 150/80  02/11/15 140/70  12/08/14 47/61   81 year old patient who has type 2 diabetes.  She states that she has not taken metformin for the past 5 days.  She states this causes anorexia.  Otherwise, doing quite well. Her weight has been stable She has had considerable situational stress due to the death of her daughter about 8 weeks ago  Has some low back pain, but otherwise no new concerns or complaints.  No further abdominal pain  Past Medical History  Diagnosis Date  . Osteoporosis   . Hyperlipidemia   . Tremor, essential   . CHF (congestive heart failure) ([place])   . Type II or unspecified type diabetes mellitus with neurological manifestations, not stated as uncontrolled   . Hypertension   . Thyroid disease   . Diverticulosis of colon   . Atrial fibrillation ([place])   . Anxiety   . DM (diabetes mellitus) ([place])   . Pain in left ear   . Hypothyroidism   . Atrial fibrillation ([place])   . Constipation     Social History   Social History  . Marital Status: Unknown    Spouse Name: N/A  . Number of Children: N/A  . Years of Education: N/A   Occupational History  . Not on file.   Social History Main Topics  . Smoking status: Never Smoker   . Smokeless tobacco: Never Used  . Alcohol Use: No  . Drug Use: No  . Sexual Activity: Not on file   Other Topics Concern  . Not on file   Social History Narrative    Past Surgical History  Procedure Laterality Date  . Cataract extraction  2002  . Pacemaker placement    . L3 compression fraction    . Transthoracic echocardiogram  2011, 2012    Family History  Problem  Relation Age of Onset  . Heart attack Father   . Colon cancer Neg Hx     Allergies  Allergen Reactions  . Codeine Sulfate     REACTION: unspecified  . Simvastatin     REACTION: choking, acid reflux    Current Outpatient Prescriptions on File Prior to Visit  Medication Sig Dispense Refill  . ACCU-CHEK SOFTCLIX LANCETS lancets TEST once daily 100 each 12  . acetaminophen (TYLENOL) 650 MG CR tablet Take 650 mg by mouth every 8 (eight) hours as needed for pain.    . Blood Glucose Monitoring Suppl (ACCU-CHEK AVIVA PLUS) W/DEVICE KIT Use as directed 1 kit 0  . diltiazem (CARDIZEM CD) 300 MG 24 hr capsule take 1 capsule by mouth once daily 90 capsule 2  . ferrous sulfate 325 (65 FE) MG tablet take 1 tablet by mouth once daily WITH BREAKFAST 30 tablet 2  . furosemide (LASIX) 20 MG tablet take 1 tablet by mouth once daily 90 tablet 1  . glucose blood (ACCU-CHEK AVIVA PLUS) test strip 1 each by Other route daily as needed. 100 each 5  . hyoscyamine (LEVBID) 0.375 MG 12 hr tablet take 1 tablet by mouth twice a day ONLY IF  NEEDED, **DO NOT TAKE EVERYDAY MUST LAST 30 DAYS** 30 tablet 2  . levothyroxine (SYNTHROID, LEVOTHROID) 75 MCG tablet take 1 tablet by mouth once daily 90 tablet 2  . meclizine (ANTIVERT) 25 MG tablet Take 1 tablet (25 mg total) by mouth 3 (three) times daily as needed for dizziness. 30 tablet 4  . metFORMIN (GLUCOPHAGE-XR) 500 MG 24 hr tablet Take 1 tablet (500 mg total) by mouth daily with breakfast. 90 tablet 3  . polyethylene glycol powder (GLYCOLAX/MIRALAX) powder Take 1 Container by mouth daily as needed (constipation).    . potassium chloride (K-DUR,KLOR-CON) 10 MEQ tablet take 1 tablet by mouth once daily 30 tablet 5  . telmisartan (MICARDIS) 80 MG tablet take 1 tablet by mouth once daily 90 tablet 2  . warfarin (COUMADIN) 2 MG tablet Take as directed by anticoagulation clinic 90 tablet 1   No current facility-administered medications on file prior to visit.    BP  150/80 mmHg  Pulse 94  Temp(Src) 98 F (36.7 C) (Oral)  Resp 20  Ht $R'5\' 3"'Vb$  (1.6 m)  Wt 157 lb (71.215 kg)  BMI 27.82 kg/m2  SpO2 98%      Review of Systems  Constitutional: Positive for appetite change.  HENT: Negative for congestion, dental problem, hearing loss, rhinorrhea, sinus pressure, sore throat and tinnitus.   Eyes: Negative for pain, discharge and visual disturbance.  Respiratory: Negative for cough and shortness of breath.   Cardiovascular: Negative for chest pain, palpitations and leg swelling.  Gastrointestinal: Negative for nausea, vomiting, abdominal pain, diarrhea, constipation, blood in stool and abdominal distention.  Genitourinary: Negative for dysuria, urgency, frequency, hematuria, flank pain, vaginal bleeding, vaginal discharge, difficulty urinating, vaginal pain and pelvic pain.  Musculoskeletal: Positive for back pain. Negative for joint swelling, arthralgias and gait problem.  Skin: Negative for rash.  Neurological: Negative for dizziness, syncope, speech difficulty, weakness, numbness and headaches.  Hematological: Negative for adenopathy.  Psychiatric/Behavioral: Negative for behavioral problems, dysphoric mood and agitation. The patient is nervous/anxious.        Objective:   Physical Exam  Constitutional: She is oriented to person, place, and time. She appears well-developed and well-nourished.  HENT:  Head: Normocephalic.  Right Ear: External ear normal.  Left Ear: External ear normal.  Mouth/Throat: Oropharynx is clear and moist.  Eyes: Conjunctivae and EOM are normal. Pupils are equal, round, and reactive to light.  Neck: Normal range of motion. Neck supple. No thyromegaly present.  Cardiovascular: Normal rate, regular rhythm and normal heart sounds.   Pulmonary/Chest: Effort normal and breath sounds normal.  Abdominal: Soft. Bowel sounds are normal. She exhibits no mass. There is no tenderness.  Musculoskeletal: Normal range of motion.    Lymphadenopathy:    She has no cervical adenopathy.  Neurological: She is alert and oriented to person, place, and time.  Skin: Skin is warm and dry. No rash noted.  Psychiatric: She has a normal mood and affect. Her behavior is normal.          Assessment & Plan:  Diabetes mellitus.  Will check a hemoglobin A1c.  We'll continue to hold metformin therapy at this time Essential hypertension, stable Anxiety disorder Atrial fibrillation, stable.  Continue anticoagulation  Recheck 3 months  We'll check updated lab

## 2015-03-22 NOTE — Patient Instructions (Signed)
Limit your sodium (Salt) intake   Please check your hemoglobin A1c every 3 months   

## 2015-03-29 ENCOUNTER — Ambulatory Visit (INDEPENDENT_AMBULATORY_CARE_PROVIDER_SITE_OTHER): Payer: Medicare Other | Admitting: General Practice

## 2015-03-29 DIAGNOSIS — I82419 Acute embolism and thrombosis of unspecified femoral vein: Secondary | ICD-10-CM

## 2015-03-29 DIAGNOSIS — Z5181 Encounter for therapeutic drug level monitoring: Secondary | ICD-10-CM | POA: Diagnosis not present

## 2015-03-29 LAB — POCT INR
INR: 1.5
INR: 1.5

## 2015-03-29 NOTE — Progress Notes (Signed)
Ok with this plan 

## 2015-03-29 NOTE — Progress Notes (Signed)
Pre visit review using our clinic review tool, if applicable. No additional management support is needed unless otherwise documented below in the visit note. 

## 2015-04-01 LAB — CUP PACEART REMOTE DEVICE CHECK
Brady Statistic RV Percent Paced: 100 %
Date Time Interrogation Session: 20161028164653
Implantable Lead Implant Date: 20031208
Implantable Lead Location: 753860
Lead Channel Impedance Value: 640 Ohm
Lead Channel Pacing Threshold Amplitude: 0.625 V
Lead Channel Setting Pacing Amplitude: 2.5 V
Lead Channel Setting Pacing Pulse Width: 0.4 ms
MDC IDC LEAD IMPLANT DT: 20031208
MDC IDC LEAD LOCATION: 753859
MDC IDC MSMT BATTERY IMPEDANCE: 769 Ohm
MDC IDC MSMT BATTERY REMAINING LONGEVITY: 64 mo
MDC IDC MSMT BATTERY VOLTAGE: 2.77 V
MDC IDC MSMT LEADCHNL RA IMPEDANCE VALUE: 67 Ohm
MDC IDC MSMT LEADCHNL RV PACING THRESHOLD PULSEWIDTH: 0.4 ms
MDC IDC SET LEADCHNL RV SENSING SENSITIVITY: 1.4 mV

## 2015-04-02 ENCOUNTER — Encounter: Payer: Self-pay | Admitting: Cardiology

## 2015-04-12 ENCOUNTER — Ambulatory Visit (INDEPENDENT_AMBULATORY_CARE_PROVIDER_SITE_OTHER): Payer: Medicare Other | Admitting: General Practice

## 2015-04-12 DIAGNOSIS — Z5181 Encounter for therapeutic drug level monitoring: Secondary | ICD-10-CM | POA: Diagnosis not present

## 2015-04-12 DIAGNOSIS — I82419 Acute embolism and thrombosis of unspecified femoral vein: Secondary | ICD-10-CM

## 2015-04-12 LAB — POCT INR: INR: 2.3

## 2015-04-12 NOTE — Progress Notes (Signed)
Pre visit review using our clinic review tool, if applicable. No additional management support is needed unless otherwise documented below in the visit note. 

## 2015-04-12 NOTE — Progress Notes (Signed)
Agree with coumadin management. 

## 2015-04-22 ENCOUNTER — Other Ambulatory Visit: Payer: Self-pay | Admitting: Internal Medicine

## 2015-05-09 ENCOUNTER — Other Ambulatory Visit: Payer: Self-pay | Admitting: Internal Medicine

## 2015-05-10 ENCOUNTER — Other Ambulatory Visit: Payer: Self-pay | Admitting: General Practice

## 2015-05-10 ENCOUNTER — Ambulatory Visit (INDEPENDENT_AMBULATORY_CARE_PROVIDER_SITE_OTHER): Payer: Medicare Other | Admitting: General Practice

## 2015-05-10 DIAGNOSIS — Z5181 Encounter for therapeutic drug level monitoring: Secondary | ICD-10-CM | POA: Diagnosis not present

## 2015-05-10 DIAGNOSIS — I82419 Acute embolism and thrombosis of unspecified femoral vein: Secondary | ICD-10-CM

## 2015-05-10 LAB — POCT INR: INR: 1.7

## 2015-05-10 MED ORDER — WARFARIN SODIUM 2 MG PO TABS: ORAL_TABLET | ORAL | Status: DC

## 2015-05-10 NOTE — Progress Notes (Signed)
Pre visit review using our clinic review tool, if applicable. No additional management support is needed unless otherwise documented below in the visit note. 

## 2015-05-15 ENCOUNTER — Emergency Department (HOSPITAL_COMMUNITY)
Admission: EM | Admit: 2015-05-15 | Discharge: 2015-05-15 | Disposition: A | Discharge status: Home / Self Care | Payer: Medicare Other | Attending: Emergency Medicine | Admitting: Emergency Medicine

## 2015-05-15 ENCOUNTER — Emergency Department (HOSPITAL_COMMUNITY): Payer: Medicare Other

## 2015-05-15 ENCOUNTER — Encounter (HOSPITAL_COMMUNITY): Payer: Self-pay | Admitting: *Deleted

## 2015-05-15 DIAGNOSIS — F29 Unspecified psychosis not due to a substance or known physiological condition: Secondary | ICD-10-CM | POA: Diagnosis not present

## 2015-05-15 DIAGNOSIS — Z7984 Long term (current) use of oral hypoglycemic drugs: Secondary | ICD-10-CM | POA: Insufficient documentation

## 2015-05-15 DIAGNOSIS — Z8739 Personal history of other diseases of the musculoskeletal system and connective tissue: Secondary | ICD-10-CM | POA: Insufficient documentation

## 2015-05-15 DIAGNOSIS — S6991XA Unspecified injury of right wrist, hand and finger(s), initial encounter: Secondary | ICD-10-CM | POA: Diagnosis present

## 2015-05-15 DIAGNOSIS — Y9389 Activity, other specified: Secondary | ICD-10-CM | POA: Insufficient documentation

## 2015-05-15 DIAGNOSIS — S60221A Contusion of right hand, initial encounter: Secondary | ICD-10-CM | POA: Diagnosis not present

## 2015-05-15 DIAGNOSIS — K5900 Constipation, unspecified: Secondary | ICD-10-CM | POA: Insufficient documentation

## 2015-05-15 DIAGNOSIS — I10 Essential (primary) hypertension: Secondary | ICD-10-CM | POA: Diagnosis not present

## 2015-05-15 DIAGNOSIS — Z950 Presence of cardiac pacemaker: Secondary | ICD-10-CM | POA: Diagnosis not present

## 2015-05-15 DIAGNOSIS — M79644 Pain in right finger(s): Secondary | ICD-10-CM | POA: Diagnosis not present

## 2015-05-15 DIAGNOSIS — I509 Heart failure, unspecified: Secondary | ICD-10-CM | POA: Diagnosis not present

## 2015-05-15 DIAGNOSIS — Y998 Other external cause status: Secondary | ICD-10-CM | POA: Diagnosis not present

## 2015-05-15 DIAGNOSIS — S3992XA Unspecified injury of lower back, initial encounter: Secondary | ICD-10-CM | POA: Diagnosis not present

## 2015-05-15 DIAGNOSIS — Z79899 Other long term (current) drug therapy: Secondary | ICD-10-CM | POA: Insufficient documentation

## 2015-05-15 DIAGNOSIS — Z8669 Personal history of other diseases of the nervous system and sense organs: Secondary | ICD-10-CM | POA: Diagnosis not present

## 2015-05-15 DIAGNOSIS — W010XXA Fall on same level from slipping, tripping and stumbling without subsequent striking against object, initial encounter: Secondary | ICD-10-CM | POA: Diagnosis not present

## 2015-05-15 DIAGNOSIS — Y92009 Unspecified place in unspecified non-institutional (private) residence as the place of occurrence of the external cause: Secondary | ICD-10-CM | POA: Diagnosis not present

## 2015-05-15 DIAGNOSIS — Z7901 Long term (current) use of anticoagulants: Secondary | ICD-10-CM | POA: Diagnosis not present

## 2015-05-15 DIAGNOSIS — I4891 Unspecified atrial fibrillation: Secondary | ICD-10-CM | POA: Diagnosis not present

## 2015-05-15 DIAGNOSIS — S79912A Unspecified injury of left hip, initial encounter: Secondary | ICD-10-CM | POA: Diagnosis not present

## 2015-05-15 DIAGNOSIS — E039 Hypothyroidism, unspecified: Secondary | ICD-10-CM | POA: Insufficient documentation

## 2015-05-15 DIAGNOSIS — F419 Anxiety disorder, unspecified: Secondary | ICD-10-CM | POA: Insufficient documentation

## 2015-05-15 DIAGNOSIS — M545 Low back pain: Secondary | ICD-10-CM | POA: Diagnosis not present

## 2015-05-15 DIAGNOSIS — E1149 Type 2 diabetes mellitus with other diabetic neurological complication: Secondary | ICD-10-CM | POA: Diagnosis not present

## 2015-05-15 DIAGNOSIS — F99 Mental disorder, not otherwise specified: Secondary | ICD-10-CM | POA: Diagnosis not present

## 2015-05-15 DIAGNOSIS — W19XXXA Unspecified fall, initial encounter: Secondary | ICD-10-CM

## 2015-05-15 NOTE — ED Notes (Signed)
Radiology tech seen ambulating pt in hall to x-ray.  Pt was unsteady on her feet, holding on to tech.  Wheelchair brought over to prevent fall.

## 2015-05-15 NOTE — Discharge Instructions (Signed)
Return to the ED with any concerns including increased pain or swelling, fainting, vomiting, weakness of arms or legs, decreased level of alertness/lethargy, or any other alarming symptoms

## 2015-05-15 NOTE — ED Notes (Signed)
Per EMS, pt complains of fall today. Pt states her slippers slipped, causing her to fall. Pt complains of pain in her left hip and right wrist. Pt has abrasion to lower back. Pt denies loss of consciousness/head injury.

## 2015-05-15 NOTE — ED Provider Notes (Signed)
CSN: [id number]     Arrival date & time 05/15/15  1706 History   First MD Initiated Contact with Patient 05/15/15 1731     Chief Complaint  Patient presents with  . Fall  . Hip Pain  . Wrist Pain     (Consider location/radiation/quality/duration/timing/severity/associated sxs/prior Treatment) HPI  Pt presents with c/o fall.  She states she was wearing slippers which caused her foot to slip and she fell in her home.  No LOC.  Denies striking her head.  No amnesia of event.  Pt called EMS and initially c/o pain in left hip and right hand and low back.  On my evaluation she states she no longer has back or hip pain.  She is requesting to be discharged home.  She denies chest pain, palpitations or severe headache prior to fall.  Continues to have throbbing pain in right hand.  There are no other associated systemic symptoms, there are no other alleviating or modifying factors.   Past Medical History  Diagnosis Date  . Osteoporosis   . Hyperlipidemia   . Tremor, essential   . CHF (congestive heart failure) ([place])   . Type II or unspecified type diabetes mellitus with neurological manifestations, not stated as uncontrolled   . Hypertension   . Thyroid disease   . Diverticulosis of colon   . Atrial fibrillation ([place])   . Anxiety   . DM (diabetes mellitus) ([place])   . Pain in left ear   . Hypothyroidism   . Atrial fibrillation ([place])   . Constipation    Past Surgical History  Procedure Laterality Date  . Cataract extraction  2002  . Pacemaker placement    . L3 compression fraction    . Transthoracic echocardiogram  2011, 2012   Family History  Problem Relation Age of Onset  . Heart attack Father   . Colon cancer Neg Hx    Social History  Substance Use Topics  . Smoking status: Never Smoker   . Smokeless tobacco: Never Used  . Alcohol Use: No   OB History    No data available     Review of Systems  ROS reviewed and all otherwise negative except for mentioned in  HPI    Allergies  Codeine sulfate and Simvastatin  Home Medications   Prior to Admission medications   Medication Sig Start Date End Date Taking? Authorizing Provider  ACCU-CHEK SOFTCLIX LANCETS lancets TEST once daily 07/08/14   Marletta Lor, MD  acetaminophen (TYLENOL) 650 MG CR tablet Take 650 mg by mouth every 8 (eight) hours as needed for pain.    Historical Provider, MD  Blood Glucose Monitoring Suppl (ACCU-CHEK AVIVA PLUS) W/DEVICE KIT Use as directed 07/16/14   Lucretia Kern, DO  diazepam (VALIUM) 5 MG tablet Take 5 mg by mouth as needed.  03/04/15   Historical Provider, MD  diltiazem (CARDIZEM CD) 300 MG 24 hr capsule take 1 capsule by mouth once daily 09/30/14   Marin Olp, MD  ferrous sulfate 325 (65 FE) MG tablet take 1 tablet by mouth once daily WITH BREAKFAST 02/25/15   Marletta Lor, MD  furosemide (LASIX) 20 MG tablet take 1 tablet by mouth once daily 12/30/14   Marletta Lor, MD  glucose blood (ACCU-CHEK AVIVA PLUS) test strip 1 each by Other route daily as needed. 06/19/14   Marletta Lor, MD  hyoscyamine (LEVBID) 0.375 MG 12 hr tablet take 1 tablet by mouth twice a day ONLY IF NEEDED, **  DO NOT TAKE EVERYDAY MUST LAST 30 DAYS** 12/25/14   Marletta Lor, MD  levothyroxine (SYNTHROID, LEVOTHROID) 75 MCG tablet take 1 tablet by mouth once daily 09/30/14   Marin Olp, MD  meclizine (ANTIVERT) 25 MG tablet Take 1 tablet (25 mg total) by mouth 3 (three) times daily as needed for dizziness. 04/24/13   Marletta Lor, MD  metFORMIN (GLUCOPHAGE-XR) 500 MG 24 hr tablet Take 1 tablet (500 mg total) by mouth daily with breakfast. 12/08/14   Marletta Lor, MD  polyethylene glycol powder (GLYCOLAX/MIRALAX) powder Take 1 Container by mouth daily as needed (constipation).    Historical Provider, MD  potassium chloride (K-DUR,KLOR-CON) 10 MEQ tablet take 1 tablet by mouth once daily 11/19/14   Marletta Lor, MD  telmisartan (MICARDIS) 80 MG  tablet take 1 tablet by mouth once daily 09/30/14   Marin Olp, MD  warfarin (COUMADIN) 2 MG tablet Take as directed by anticoagulation clinic 05/10/15   Marletta Lor, MD   BP 147/65 mmHg  Pulse 71  Temp(Src) 98.3 F (36.8 C) (Oral)  Resp 12  SpO2 95%  Vitals reviewed Physical Exam  Physical Examination: General appearance - alert, well appearing, and in no distress Mental status - alert, oriented to person, place, and time Eyes - pupils equal and reactive, extraocular eye movements intact Neck - no midline tenderness to palpation Chest - clear to auscultation, no wheezes, rales or rhonchi, symmetric air entry Heart - normal rate, regular rhythm, normal S1, S2, no murmurs, rubs, clicks or gallops Abdomen - soft, nontender, nondistended, no masses or organomegaly Back exam - no midline tenderness to palpation, no CVA tenderness Neurological - alert, oriented, normal speech, strength 5/5 in extremities x4, sensation intact Musculoskeletal - no joint tenderness, deformity or swelling, FROM of hips and knees without pain bilaterally, bruising over right dorsum of ulnar distribution of hand with tenderness to palpation, fingers and hand NVI Extremities - peripheral pulses normal, no pedal edema, no clubbing or cyanosis Skin - normal coloration and turgor, no rashes, contusion overlying dorsum of right hand  ED Course  Procedures (including critical care time) Labs Review Labs Reviewed - No data to display  Imaging Review Dg Hand Complete Right  05/15/2015  CLINICAL DATA:  81 year old female with fall and pain to the fourth and fifth digits. EXAM: RIGHT HAND - COMPLETE 3+ VIEW COMPARISON:  None. FINDINGS: There is osteopenia. There is osteoarthritic changes of the carpal bones as well as osteoarthritic changes of the first carpometacarpal joint. No acute fracture or dislocation. The soft tissues appear unremarkable. IMPRESSION: No acute fracture. Electronically Signed   By:  Anner Crete M.D.   On: 05/15/2015 20:01   I have personally reviewed and evaluated these images and lab results as part of my medical decision-making.   EKG Interpretation None      MDM   Final diagnoses:  Fall, initial encounter  Contusion of hand, right, initial encounter    Pt presenting with c/o mechanical fall.  Initially she c/o of hip and hand pain.  Currently no hip pain and able to ambulate/bear weight without hip pain.  Xray of hand is negative.  Pt did no have syncope or head injury.  She is requesting discharge.  Discharged with strict return precautions.  Pt agreeable with plan.    Alfonzo Beers, MD 05/15/15 [phone]

## 2015-05-27 ENCOUNTER — Other Ambulatory Visit: Payer: Self-pay | Admitting: Internal Medicine

## 2015-05-27 NOTE — Telephone Encounter (Signed)
Pt called stating that she needs a refill on her Diazepam 5MG . She usually gets a supply once every 3 months. Pt states that she needs it refilled now, so that she doesn't get caught in the snow. Please advise.

## 2015-05-31 ENCOUNTER — Encounter: Payer: Self-pay | Admitting: Internal Medicine

## 2015-06-01 ENCOUNTER — Encounter: Payer: Self-pay | Admitting: Internal Medicine

## 2015-06-01 MED ORDER — DIAZEPAM 5 MG PO TABS: 5.0000 mg | ORAL_TABLET | ORAL | Status: DC | PRN

## 2015-06-01 NOTE — Telephone Encounter (Signed)
Discussed with Dr. Kirtland BouchardK, and he said okay to fill Valium 30 tablets, 0 refill.  Tried to contact pt no answer, phone just rings unable to leave message.  Called and spoke to [place], told her it was denied since back in October pt was giving her medication to a neighbor which was reported by [employer].  Marylene Landngela said that is not true and that [name] should not be told anything and we should not believe anything she says. She has a restraining order on her at present. Told her Dr.K said he would give her 30 tablets of Valium but she must be monitored. Marylene Landngela verbalized understanding. Told Marylene Landngela she needs to bring POA papers again for the chart and have pt sign DPR again. Rx will be called into pharmacy. Marylene Landngela verbalized understanding.

## 2015-06-01 NOTE — Telephone Encounter (Signed)
Maria LeashDonna phone call angela at  [phone]. Marylene Landngela would like to know why her mom diazepam was denied

## 2015-06-02 ENCOUNTER — Other Ambulatory Visit: Payer: Self-pay | Admitting: Family Medicine

## 2015-06-07 ENCOUNTER — Encounter: Payer: Self-pay | Admitting: Internal Medicine

## 2015-06-07 ENCOUNTER — Other Ambulatory Visit: Payer: Self-pay | Admitting: *Deleted

## 2015-06-07 ENCOUNTER — Ambulatory Visit (INDEPENDENT_AMBULATORY_CARE_PROVIDER_SITE_OTHER): Payer: Medicare Other | Admitting: General Practice

## 2015-06-07 DIAGNOSIS — I82419 Acute embolism and thrombosis of unspecified femoral vein: Secondary | ICD-10-CM

## 2015-06-07 DIAGNOSIS — Z5181 Encounter for therapeutic drug level monitoring: Secondary | ICD-10-CM | POA: Diagnosis not present

## 2015-06-07 DIAGNOSIS — I4891 Unspecified atrial fibrillation: Secondary | ICD-10-CM | POA: Diagnosis not present

## 2015-06-07 LAB — POCT INR: INR: 2.4

## 2015-06-07 MED ORDER — FERROUS SULFATE 325 (65 FE) MG PO TABS: ORAL_TABLET | ORAL | Status: DC

## 2015-06-07 NOTE — Progress Notes (Signed)
Pre visit review using our clinic review tool, if applicable. No additional management support is needed unless otherwise documented below in the visit note. 

## 2015-06-20 ENCOUNTER — Encounter: Payer: Self-pay | Admitting: Internal Medicine

## 2015-06-21 ENCOUNTER — Ambulatory Visit (INDEPENDENT_AMBULATORY_CARE_PROVIDER_SITE_OTHER): Payer: Medicare Other | Admitting: *Deleted

## 2015-06-21 DIAGNOSIS — I4891 Unspecified atrial fibrillation: Secondary | ICD-10-CM | POA: Diagnosis not present

## 2015-06-21 NOTE — Progress Notes (Signed)
Remote pacemaker transmission.   

## 2015-06-22 ENCOUNTER — Other Ambulatory Visit: Payer: Self-pay | Admitting: Family Medicine

## 2015-06-23 ENCOUNTER — Other Ambulatory Visit: Payer: Self-pay | Admitting: Internal Medicine

## 2015-06-23 MED ORDER — HYOSCYAMINE SULFATE ER 0.375 MG PO TB12: ORAL_TABLET | ORAL | Status: DC

## 2015-06-24 ENCOUNTER — Other Ambulatory Visit: Payer: Self-pay | Admitting: *Deleted

## 2015-06-24 MED ORDER — FUROSEMIDE 20 MG PO TABS: 20.0000 mg | ORAL_TABLET | Freq: Every day | ORAL | Status: DC

## 2015-06-25 ENCOUNTER — Telehealth: Payer: Self-pay | Admitting: *Deleted

## 2015-06-25 LAB — CUP PACEART REMOTE DEVICE CHECK
Battery Impedance: 898 Ohm
Battery Voltage: 2.78 V
Brady Statistic RV Percent Paced: 100 %
Date Time Interrogation Session: 20170130150215
Implantable Lead Implant Date: 20031208
Implantable Lead Location: 753859
Implantable Lead Model: 5594
Lead Channel Impedance Value: 67 Ohm
Lead Channel Pacing Threshold Pulse Width: 0.4 ms
Lead Channel Setting Pacing Amplitude: 2.5 V
Lead Channel Setting Sensing Sensitivity: 1.4 mV
MDC IDC LEAD IMPLANT DT: 20031208
MDC IDC LEAD LOCATION: 753860
MDC IDC MSMT BATTERY REMAINING LONGEVITY: 59 mo
MDC IDC MSMT LEADCHNL RV IMPEDANCE VALUE: 670 Ohm
MDC IDC MSMT LEADCHNL RV PACING THRESHOLD AMPLITUDE: 0.625 V
MDC IDC SET LEADCHNL RV PACING PULSEWIDTH: 0.4 ms

## 2015-06-25 NOTE — Telephone Encounter (Signed)
[facility].  [address]  [phone] Patient request a refill of Diazepam 5 mg and hyoscyamine 0.375

## 2015-06-25 NOTE — Telephone Encounter (Signed)
Rx for Hyoscyamine was filled on 2/1. Diazepam is not due to 2/10

## 2015-06-30 ENCOUNTER — Other Ambulatory Visit: Payer: Self-pay | Admitting: Internal Medicine

## 2015-06-30 MED ORDER — HYOSCYAMINE SULFATE ER 0.375 MG PO TB12: ORAL_TABLET | ORAL | Status: DC

## 2015-07-01 ENCOUNTER — Ambulatory Visit (INDEPENDENT_AMBULATORY_CARE_PROVIDER_SITE_OTHER): Payer: Medicare Other | Admitting: Internal Medicine

## 2015-07-01 ENCOUNTER — Encounter: Payer: Self-pay | Admitting: Internal Medicine

## 2015-07-01 VITALS — BP 102/60 | HR 80 | Temp 98.0°F | Resp 20 | Ht 63.0 in | Wt 159.0 lb

## 2015-07-01 DIAGNOSIS — I5020 Unspecified systolic (congestive) heart failure: Secondary | ICD-10-CM | POA: Diagnosis not present

## 2015-07-01 DIAGNOSIS — E785 Hyperlipidemia, unspecified: Secondary | ICD-10-CM | POA: Diagnosis not present

## 2015-07-01 DIAGNOSIS — E0840 Diabetes mellitus due to underlying condition with diabetic neuropathy, unspecified: Secondary | ICD-10-CM

## 2015-07-01 DIAGNOSIS — I480 Paroxysmal atrial fibrillation: Secondary | ICD-10-CM

## 2015-07-01 DIAGNOSIS — Z7901 Long term (current) use of anticoagulants: Secondary | ICD-10-CM

## 2015-07-01 DIAGNOSIS — I10 Essential (primary) hypertension: Secondary | ICD-10-CM

## 2015-07-01 MED ORDER — DIAZEPAM 5 MG PO TABS: 5.0000 mg | ORAL_TABLET | ORAL | Status: DC | PRN

## 2015-07-01 NOTE — Telephone Encounter (Signed)
ok 

## 2015-07-01 NOTE — Patient Instructions (Addendum)
Limit your sodium (Salt) intake   Please check your hemoglobin A1c every 3-6  Months  Cardiology follow-up as scheduled  Return in 6 months for follow-up   Please see your eye doctor yearly to check for diabetic eye damage

## 2015-07-01 NOTE — Telephone Encounter (Signed)
Rx called in to pharmacy. 

## 2015-07-01 NOTE — Progress Notes (Signed)
Pre visit review using our clinic review tool, if applicable. No additional management support is needed unless otherwise documented below in the visit note. 

## 2015-07-01 NOTE — Telephone Encounter (Signed)
Pt requesting refill on Diazepam okay to refill?

## 2015-07-01 NOTE — Addendum Note (Signed)
Addended by: Jimmye Norman on: 07/01/2015 01:25 PM   Modules accepted: Orders

## 2015-07-01 NOTE — Progress Notes (Signed)
Subjective:    Patient ID: [name], female    DOB: August 29, 1926, 82 y.o.   MRN: [id number]  HPI  82 year old patient who is seen today in follow-up.  She has a history of essential hypertension and type 2 diabetes, controlled with metformin therapy.  No recent hemoglobin A1c  Lab Results  Component Value Date   HGBA1C 5.9 10/06/2014    She has a history of compensated congestive heart failure, dyslipidemia and is status post permanent pacemaker insertion.  She has a history of atrial fibrillation and remains on chronic Coumadin anticoagulation  She is followed by cardiology  Doing well today.  Only complaint is some mild sinus congestion.  More left-sided.  Past Medical History  Diagnosis Date  . Osteoporosis   . Hyperlipidemia   . Tremor, essential   . CHF (congestive heart failure) ([place])   . Type II or unspecified type diabetes mellitus with neurological manifestations, not stated as uncontrolled   . Hypertension   . Thyroid disease   . Diverticulosis of colon   . Atrial fibrillation ([place])   . Anxiety   . DM (diabetes mellitus) ([place])   . Pain in left ear   . Hypothyroidism   . Atrial fibrillation ([place])   . Constipation     Social History   Social History  . Marital Status: Unknown    Spouse Name: N/A  . Number of Children: N/A  . Years of Education: N/A   Occupational History  . Not on file.   Social History Main Topics  . Smoking status: Never Smoker   . Smokeless tobacco: Never Used  . Alcohol Use: No  . Drug Use: No  . Sexual Activity: Not on file   Other Topics Concern  . Not on file   Social History Narrative    Past Surgical History  Procedure Laterality Date  . Cataract extraction  2002  . Pacemaker placement    . L3 compression fraction    . Transthoracic echocardiogram  2011, 2012    Family History  Problem Relation Age of Onset  . Heart attack Father   . Colon cancer Neg Hx     Allergies  Allergen Reactions  . Codeine  Sulfate     REACTION: unspecified  . Simvastatin     REACTION: choking, acid reflux    Current Outpatient Prescriptions on File Prior to Visit  Medication Sig Dispense Refill  . ACCU-CHEK SOFTCLIX LANCETS lancets TEST once daily 100 each 12  . acetaminophen (TYLENOL) 650 MG CR tablet Take 650 mg by mouth every 8 (eight) hours as needed for pain.    . Blood Glucose Monitoring Suppl (ACCU-CHEK AVIVA PLUS) W/DEVICE KIT Use as directed 1 kit 0  . diazepam (VALIUM) 5 MG tablet Take 1 tablet (5 mg total) by mouth as needed. 30 tablet 2  . diltiazem (CARDIZEM CD) 300 MG 24 hr capsule take 1 capsule by mouth once daily 90 capsule 2  . ferrous sulfate 325 (65 FE) MG tablet take 1 tablet by mouth once daily WITH BREAKFAST 30 tablet 2  . furosemide (LASIX) 20 MG tablet Take 1 tablet (20 mg total) by mouth daily. 90 tablet 1  . glucose blood (ACCU-CHEK AVIVA PLUS) test strip 1 each by Other route daily as needed. 100 each 5  . hyoscyamine (LEVBID) 0.375 MG 12 hr tablet take 1 tablet by mouth twice a day ONLY IF NEEDED, **DO NOT TAKE EVERYDAY MUST LAST 30 DAYS** 30 tablet 2  .  levothyroxine (SYNTHROID, LEVOTHROID) 75 MCG tablet take 1 tablet by mouth once daily 90 tablet 2  . meclizine (ANTIVERT) 25 MG tablet Take 1 tablet (25 mg total) by mouth 3 (three) times daily as needed for dizziness. 30 tablet 4  . metFORMIN (GLUCOPHAGE-XR) 500 MG 24 hr tablet Take 1 tablet (500 mg total) by mouth daily with breakfast. 90 tablet 3  . polyethylene glycol powder (GLYCOLAX/MIRALAX) powder Take 1 Container by mouth daily as needed (constipation).    . potassium chloride (K-DUR,KLOR-CON) 10 MEQ tablet take 1 tablet by mouth once daily 30 tablet 5  . telmisartan (MICARDIS) 80 MG tablet take 1 tablet by mouth once daily 90 tablet 2  . warfarin (COUMADIN) 2 MG tablet Take as directed by anticoagulation clinic 105 tablet 1   No current facility-administered medications on file prior to visit.    BP 102/60 mmHg  Pulse  80  Temp(Src) 98 F (36.7 C) (Oral)  Resp 20  Ht _0  (1.6 m)  Wt 159 lb (72.122 kg)  BMI 28.17 kg/m2  SpO2 97%     Review of Systems  Constitutional: Negative.   HENT: Positive for congestion. Negative for dental problem, hearing loss, rhinorrhea, sinus pressure, sore throat and tinnitus.   Eyes: Negative for pain, discharge and visual disturbance.  Respiratory: Negative for cough and shortness of breath.   Cardiovascular: Negative for chest pain, palpitations and leg swelling.  Gastrointestinal: Negative for nausea, vomiting, abdominal pain, diarrhea, constipation, blood in stool and abdominal distention.  Genitourinary: Negative for dysuria, urgency, frequency, hematuria, flank pain, vaginal bleeding, vaginal discharge, difficulty urinating, vaginal pain and pelvic pain.  Musculoskeletal: Negative for joint swelling, arthralgias and gait problem.  Skin: Negative for rash.  Neurological: Positive for dizziness. Negative for syncope, speech difficulty, weakness, numbness and headaches.  Hematological: Negative for adenopathy.  Psychiatric/Behavioral: Negative for behavioral problems, dysphoric mood and agitation. The patient is not nervous/anxious.        Objective:   Physical Exam  Constitutional: She is oriented to person, place, and time. She appears well-developed and well-nourished. No distress.  HENT:  Head: Normocephalic.  Right Ear: External ear normal.  Left Ear: External ear normal.  Mouth/Throat: Oropharynx is clear and moist.  Eyes: Conjunctivae and EOM are normal. Pupils are equal, round, and reactive to light.  Neck: Normal range of motion. Neck supple. No thyromegaly present.  Cardiovascular: Normal rate, regular rhythm, normal heart sounds and intact distal pulses.   Pulmonary/Chest: Effort normal and breath sounds normal.  Abdominal: Soft. Bowel sounds are normal. She exhibits no mass. There is no tenderness.  Musculoskeletal: Normal range of motion. She  exhibits edema.  Lymphadenopathy:    She has no cervical adenopathy.  Neurological: She is alert and oriented to person, place, and time.  Skin: Skin is warm and dry. No rash noted.  Psychiatric: She has a normal mood and affect. Her behavior is normal.          Assessment & Plan:   Hypertension.  Blood pressure well controlled.  Low-normal range Chronic Coumadin anticoagulation.  Will check an INR paroxysmal atrial fibrillation, type 2 diabetes.  Will check a hemoglobin A1c, urine for microalbumin Congestive heart failure.  Will check renal indices   Follow-up cardiology.  Recheck here 6 months or as needed

## 2015-07-02 ENCOUNTER — Encounter: Payer: Self-pay | Admitting: Cardiology

## 2015-07-02 LAB — LIPID PANEL
CHOLESTEROL: 187 mg/dL (ref 0–200)
HDL: 34.4 mg/dL — ABNORMAL LOW (ref >39.00)
LDL CALC: 115 mg/dL — AB (ref 0–99)
NonHDL: 152.38
Total CHOL/HDL Ratio: 5
Triglycerides: 189 mg/dL — ABNORMAL HIGH (ref 0.0–149.0)
VLDL: 37.8 mg/dL (ref 0.0–40.0)

## 2015-07-02 LAB — COMPREHENSIVE METABOLIC PANEL
ALBUMIN: 4.2 g/dL (ref 3.5–5.2)
ALT: 14 U/L (ref 0–35)
AST: 16 U/L (ref 0–37)
Alkaline Phosphatase: 92 U/L (ref 39–117)
BILIRUBIN TOTAL: 0.5 mg/dL (ref 0.2–1.2)
BUN: 33 mg/dL — AB (ref 6–23)
CHLORIDE: 99 meq/L (ref 96–112)
CO2: 26 mEq/L (ref 19–32)
CREATININE: 1.37 mg/dL — AB (ref 0.40–1.20)
Calcium: 9.6 mg/dL (ref 8.4–10.5)
GFR: 38.83 mL/min — ABNORMAL LOW (ref >60.00)
Glucose, Bld: 141 mg/dL — ABNORMAL HIGH (ref 70–99)
Potassium: 3.9 mEq/L (ref 3.5–5.1)
SODIUM: 134 meq/L — AB (ref 135–145)
TOTAL PROTEIN: 7 g/dL (ref 6.0–8.3)

## 2015-07-02 LAB — MICROALBUMIN / CREATININE URINE RATIO
Creatinine,U: 73.6 mg/dL
Microalb Creat Ratio: 3.8 mg/g (ref 0.0–30.0)
Microalb, Ur: 2.8 mg/dL — ABNORMAL HIGH (ref 0.0–1.9)

## 2015-07-02 LAB — TSH: TSH: 1.43 u[IU]/mL (ref 0.35–4.50)

## 2015-07-02 LAB — PROTIME-INR
INR: 2 ratio — ABNORMAL HIGH (ref 0.8–1.0)
Prothrombin Time: 21.4 s — ABNORMAL HIGH (ref 9.6–13.1)

## 2015-07-02 LAB — HEMOGLOBIN A1C: HEMOGLOBIN A1C: 6.6 % — AB (ref 4.6–6.5)

## 2015-07-03 LAB — CBC WITH DIFFERENTIAL/PLATELET
BASOS ABS: 0 10*3/uL (ref 0.0–0.1)
BASOS PCT: 0.3 % (ref 0.0–3.0)
Eosinophils Absolute: 0.3 10*3/uL (ref 0.0–0.7)
Eosinophils Relative: 3.1 % (ref 0.0–5.0)
HCT: 43.4 % (ref 36.0–46.0)
Hemoglobin: 14.1 g/dL (ref 12.0–15.0)
LYMPHS ABS: 2.7 10*3/uL (ref 0.7–4.0)
Lymphocytes Relative: 31 % (ref 12.0–46.0)
MCHC: 32.5 g/dL (ref 30.0–36.0)
MCV: 101.4 fl — AB (ref 78.0–100.0)
MONOS PCT: 7.2 % (ref 3.0–12.0)
Monocytes Absolute: 0.6 10*3/uL (ref 0.1–1.0)
NEUTROS ABS: 5.1 10*3/uL (ref 1.4–7.7)
NEUTROS PCT: 58.4 % (ref 43.0–77.0)
PLATELETS: 149 10*3/uL — AB (ref 150.0–400.0)
RBC: 4.28 Mil/uL (ref 3.87–5.11)
RDW: 14.5 % (ref 11.5–15.5)
WBC: 8.7 10*3/uL (ref 4.0–10.5)

## 2015-07-05 ENCOUNTER — Ambulatory Visit: Payer: Medicare Other

## 2015-07-05 ENCOUNTER — Ambulatory Visit (INDEPENDENT_AMBULATORY_CARE_PROVIDER_SITE_OTHER): Payer: Medicare Other | Admitting: General Practice

## 2015-07-05 DIAGNOSIS — I4891 Unspecified atrial fibrillation: Secondary | ICD-10-CM

## 2015-07-05 DIAGNOSIS — Z5181 Encounter for therapeutic drug level monitoring: Secondary | ICD-10-CM

## 2015-07-05 NOTE — Progress Notes (Signed)
Pre visit review using our clinic review tool, if applicable. No additional management support is needed unless otherwise documented below in the visit note. 

## 2015-08-02 ENCOUNTER — Ambulatory Visit (INDEPENDENT_AMBULATORY_CARE_PROVIDER_SITE_OTHER): Payer: Medicare Other | Admitting: General Practice

## 2015-08-02 ENCOUNTER — Encounter: Payer: Self-pay | Admitting: Family Medicine

## 2015-08-02 ENCOUNTER — Ambulatory Visit (INDEPENDENT_AMBULATORY_CARE_PROVIDER_SITE_OTHER): Payer: Medicare Other | Admitting: Family Medicine

## 2015-08-02 VITALS — BP 132/69 | HR 97 | Temp 98.1°F | Ht 63.0 in | Wt 160.0 lb

## 2015-08-02 DIAGNOSIS — I4891 Unspecified atrial fibrillation: Secondary | ICD-10-CM | POA: Diagnosis not present

## 2015-08-02 DIAGNOSIS — Z5181 Encounter for therapeutic drug level monitoring: Secondary | ICD-10-CM

## 2015-08-02 DIAGNOSIS — H6121 Impacted cerumen, right ear: Secondary | ICD-10-CM

## 2015-08-02 DIAGNOSIS — I82419 Acute embolism and thrombosis of unspecified femoral vein: Secondary | ICD-10-CM

## 2015-08-02 LAB — POCT INR: INR: 1.6

## 2015-08-02 NOTE — Progress Notes (Signed)
Pre visit review using our clinic review tool, if applicable. No additional management support is needed unless otherwise documented below in the visit note. 

## 2015-08-02 NOTE — Progress Notes (Signed)
   Subjective:    Patient ID: [name], female    DOB: June 16, 1926, 82 y.o.   MRN: [id number]  HPI Here for 3 weeks of decreased hearing and pain in the left ear. No sinus congestion. No dizziness.    Review of Systems  Constitutional: Negative.   HENT: Positive for ear pain and hearing loss. Negative for congestion, ear discharge, facial swelling, postnasal drip, sinus pressure and sore throat.   Eyes: Negative.   Respiratory: Negative.        Objective:   Physical Exam  Constitutional: She appears well-developed and well-nourished. No distress.  HENT:  Nose: Nose normal.  Mouth/Throat: Oropharynx is clear and moist.  Right ear canal has a small amount of cerumen. The left ear canal is full of cerumen.   Eyes: Conjunctivae are normal.  Neck: Neck supple. No thyromegaly present.  Lymphadenopathy:    She has no cervical adenopathy.          Assessment & Plan:  Cerumen impaction, irrigated clear with water

## 2015-08-06 ENCOUNTER — Telehealth: Payer: Self-pay | Admitting: Internal Medicine

## 2015-08-06 ENCOUNTER — Ambulatory Visit (INDEPENDENT_AMBULATORY_CARE_PROVIDER_SITE_OTHER): Payer: Medicare Other | Admitting: Family Medicine

## 2015-08-06 ENCOUNTER — Encounter: Payer: Self-pay | Admitting: Family Medicine

## 2015-08-06 VITALS — BP 120/56 | HR 79 | Temp 98.3°F | Ht 63.0 in | Wt 153.5 lb

## 2015-08-06 DIAGNOSIS — K5900 Constipation, unspecified: Secondary | ICD-10-CM | POA: Diagnosis not present

## 2015-08-06 DIAGNOSIS — N390 Urinary tract infection, site not specified: Secondary | ICD-10-CM

## 2015-08-06 LAB — POCT URINALYSIS DIPSTICK
BILIRUBIN UA: NEGATIVE
Glucose, UA: NEGATIVE
Ketones, UA: NEGATIVE
NITRITE UA: POSITIVE
PH UA: 7
Spec Grav, UA: 1.01
Urobilinogen, UA: 0.2

## 2015-08-06 MED ORDER — NITROFURANTOIN MONOHYD MACRO 100 MG PO CAPS: 100.0000 mg | ORAL_CAPSULE | Freq: Two times a day (BID) | ORAL | Status: DC

## 2015-08-06 NOTE — Telephone Encounter (Signed)
[facility] - Client TELEPHONE ADVICE RECORD [facility]  Patient Name: [name]  DOB: 1926/08/11    Initial Comment caller states she has lower abd/pelvic pain and thinks something has fallen or prolapsed in the vaginal wall, she had some vaginal blood   Nurse Assessment  Nurse: Laural BenesJohnson, RN, Dondra SpryGail Date/Time (Eastern Time): 08/06/2015 9:28:16 AM  Confirm and document reason for call. If symptomatic, describe symptoms. You must click the next button to save text entered. ---[name] has prolapsed uterus and having some abd pain left to right lower pelvic area --- has had some blood with it.  Has the patient traveled out of the country within the last 30 days? ---No  Does the patient have any new or worsening symptoms? ---Yes  Will a triage be completed? ---Yes  Related visit to physician within the last 2 weeks? ---No  Does the PT have any chronic conditions? (i.e. diabetes, asthma, etc.) ---Unknown  Is this a behavioral health or substance abuse call? ---No     Guidelines    Guideline Title Affirmed Question Affirmed Notes  Abdominal Pain - Female Age > 60 years    Final Disposition User   See Physician within 24 Hours Laural BenesJohnson, RN, Dondra SpryGail    Comments  Note: Nurse made appt for 3/17-2017 345pm arrival time 400pm appt time with Dr. Terressa KoyanagiHannah R. Kim ---noting Dr. Amador CunasKwiatkowski does not have any appt today   Referrals  REFERRED TO PCP OFFICE   Disagree/Comply: Comply

## 2015-08-06 NOTE — Progress Notes (Addendum)
HPI:  [name] is a pleasant 82-year-old here for an acute visit for lower abdominal discomfort. She reports she has a long-standing history of intermittent constipation and diarrhea and has been constipated this week. She had gone several days without a bowel movement, then had a very hard dry stool this morning with straining. She had a small streak of blood on the toilet paper. She reports she has a history of uterine prolapse and wonders if this has contributed to her bowel issues. She also has had some pressure in the bladder region when she urinates for several days and intermittent bilateral and middle lower abdominal discomfort. She denies fevers, diarrhea recently, blood in the toilet or large amounts of blood, nausea, vomiting, hematuria, flank pain or malaise. She uses MiraLAX and Levbid from time to time but has not been using these recently.  ROS: See pertinent positives and negatives per HPI.  Past Medical History  Diagnosis Date  . Osteoporosis   . Hyperlipidemia   . Tremor, essential   . CHF (congestive heart failure) (HCC)   . Type II or unspecified type diabetes mellitus with neurological manifestations, not stated as uncontrolled   . Hypertension   . Thyroid disease   . Diverticulosis of colon   . Atrial fibrillation (HCC)   . Anxiety   . DM (diabetes mellitus) (HCC)   . Pain in left ear   . Hypothyroidism   . Atrial fibrillation (HCC)   . Constipation     Past Surgical History  Procedure Laterality Date  . Cataract extraction  2002  . Pacemaker placement    . L3 compression fraction    . Transthoracic echocardiogram  2011, 2012    Family History  Problem Relation Age of Onset  . Heart attack Father   . Colon cancer Neg Hx     Social History   Social History  . Marital Status: Unknown    Spouse Name: N/A  . Number of Children: N/A  . Years of Education: N/A   Social History Main Topics  . Smoking status: Never Smoker   . Smokeless tobacco:  Never Used  . Alcohol Use: No  . Drug Use: No  . Sexual Activity: Not Asked   Other Topics Concern  . None   Social History Narrative     Current outpatient prescriptions:  .  ACCU-CHEK SOFTCLIX LANCETS lancets, TEST once daily, Disp: 100 each, Rfl: 12 .  acetaminophen (TYLENOL) 650 MG CR tablet, Take 650 mg by mouth every 8 (eight) hours as needed for pain., Disp: , Rfl:  .  Blood Glucose Monitoring Suppl (ACCU-CHEK AVIVA PLUS) W/DEVICE KIT, Use as directed, Disp: 1 kit, Rfl: 0 .  diazepam (VALIUM) 5 MG tablet, Take 1 tablet (5 mg total) by mouth as needed., Disp: 30 tablet, Rfl: 2 .  diltiazem (CARDIZEM CD) 300 MG 24 hr capsule, take 1 capsule by mouth once daily, Disp: 90 capsule, Rfl: 2 .  ferrous sulfate 325 (65 FE) MG tablet, take 1 tablet by mouth once daily WITH BREAKFAST, Disp: 30 tablet, Rfl: 2 .  furosemide (LASIX) 20 MG tablet, Take 1 tablet (20 mg total) by mouth daily., Disp: 90 tablet, Rfl: 1 .  glucose blood (ACCU-CHEK AVIVA PLUS) test strip, 1 each by Other route daily as needed., Disp: 100 each, Rfl: 5 .  hyoscyamine (LEVBID) 0.375 MG 12 hr tablet, take 1 tablet by mouth twice a day ONLY IF NEEDED, **DO NOT TAKE EVERYDAY MUST LAST 30 DAYS**, Disp:   30 tablet, Rfl: 2 .  levothyroxine (SYNTHROID, LEVOTHROID) 75 MCG tablet, take 1 tablet by mouth once daily, Disp: 90 tablet, Rfl: 2 .  meclizine (ANTIVERT) 25 MG tablet, Take 1 tablet (25 mg total) by mouth 3 (three) times daily as needed for dizziness., Disp: 30 tablet, Rfl: 4 .  metFORMIN (GLUCOPHAGE-XR) 500 MG 24 hr tablet, Take 1 tablet (500 mg total) by mouth daily with breakfast., Disp: 90 tablet, Rfl: 3 .  polyethylene glycol powder (GLYCOLAX/MIRALAX) powder, Take 1 Container by mouth daily as needed (constipation)., Disp: , Rfl:  .  potassium chloride (K-DUR,KLOR-CON) 10 MEQ tablet, take 1 tablet by mouth once daily, Disp: 30 tablet, Rfl: 5 .  telmisartan (MICARDIS) 80 MG tablet, take 1 tablet by mouth once daily, Disp:  90 tablet, Rfl: 2 .  warfarin (COUMADIN) 2 MG tablet, Take as directed by anticoagulation clinic, Disp: 105 tablet, Rfl: 1 .  nitrofurantoin, macrocrystal-monohydrate, (MACROBID) 100 MG capsule, Take 1 capsule (100 mg total) by mouth 2 (two) times daily., Disp: 14 capsule, Rfl: 0  EXAM:  Filed Vitals:   08/06/15 1553  BP: 120/56  Pulse: 79  Temp: 98.3 F (36.8 C)    Body mass index is 27.2 kg/(m^2).  GENERAL: vitals reviewed and listed above, alert, oriented, appears well hydrated and in no acute distress  HEENT: atraumatic, conjunttiva clear, no obvious abnormalities on inspection of external nose and ears  NECK: no obvious masses on inspection  LUNGS: clear to auscultation bilaterally, no wheezes, rales or rhonchi, good air movement  CV: HRRR, no peripheral edema  ABD: Bowel sounds positive in all 4 quadrants, abdomen is soft and nontender throughout, no rebound or guarding. No CVA tenderness.  MS: moves all extremities without noticeable abnormality  PSYCH: pleasant and cooperative, no obvious depression or anxiety  ASSESSMENT AND PLAN:  Discussed the following assessment and plan:  Constipation, unspecified constipation type  UTI (lower urinary tract infection) - Plan: POC Urinalysis Dipstick  -Her abdomen is benign without tenderness on exam today. -Symptoms are likely from her constipation. Advised MiraLAX in water daily for 3-5 days for a goal of soft bowel movement daily. Advised of other treatment options for constipation and IBS. -Udip showed leuks and nitrites. Abx sent to pharmacy.  -Patient advised to return or notify a doctor immediately if symptoms worsen or persist or new concerns arise.  Patient Instructions  MiraLAX mixed in water each morning for the next 3-5 days or longer as needed for a goal of soft bowel movement daily.  Please follow up with your doctor if symptoms worsen, new symptoms develop or your symptoms persist.  Please take the  antibiotic as prescribed. Follow up if there doctor if you have any persistent symptoms.     KIM, HANNAH R.   

## 2015-08-06 NOTE — Progress Notes (Signed)
Pre visit review using our clinic review tool, if applicable. No additional management support is needed unless otherwise documented below in the visit note. 

## 2015-08-06 NOTE — Addendum Note (Signed)
Addended by: Johnella MoloneyFUNDERBURK, Kelden Lavallee A on: 08/06/2015 04:25 PM   Modules accepted: Orders

## 2015-08-06 NOTE — Patient Instructions (Addendum)
MiraLAX mixed in water each morning for the next 3-5 days or longer as needed for a goal of soft bowel movement daily.  Please follow up with your doctor if symptoms worsen, new symptoms develop or your symptoms persist.  Please take the antibiotic as prescribed. Follow up if there doctor if you have any persistent symptoms.

## 2015-08-06 NOTE — Telephone Encounter (Signed)
FYI

## 2015-08-06 NOTE — Addendum Note (Signed)
Addended by: Terressa KoyanagiKIM, Ermalinda Joubert R on: 08/06/2015 04:46 PM   Modules accepted: Orders, Level of Service

## 2015-08-11 ENCOUNTER — Telehealth: Payer: Self-pay | Admitting: Internal Medicine

## 2015-08-11 NOTE — Telephone Encounter (Signed)
Maria MessierKathy, pt just had Hemoglobin A1c done 07/01/2015 it was good at 6.6. Pt does not need another one. In regards to lidocaine ointment Dr.K will not order from compounding pharmacy.
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
## 2015-08-12 ENCOUNTER — Other Ambulatory Visit: Payer: Self-pay | Admitting: General Practice

## 2015-08-13 ENCOUNTER — Telehealth: Payer: Self-pay | Admitting: Internal Medicine

## 2015-08-13 NOTE — Telephone Encounter (Signed)
Pt states she would like to know if Dr Kirtland BouchardK will give her refill of  diazepam (VALIUM) 5 MG tablet 2 /day  (one in the morning and one at bedtime)  Pt states she has so much going on at the present.  [facility]/ [address]

## 2015-08-13 NOTE — Telephone Encounter (Signed)
Pt is aware.  

## 2015-08-13 NOTE — Telephone Encounter (Signed)
Spoke to pt, told her there is a Rx at the pharmacy it was sent in on 2/9 just need to let pharmacy know you need a refill. Pt verbalized understanding.

## 2015-08-16 ENCOUNTER — Telehealth: Payer: Self-pay | Admitting: *Deleted

## 2015-08-16 ENCOUNTER — Other Ambulatory Visit: Payer: Self-pay | Admitting: General Practice

## 2015-08-16 ENCOUNTER — Other Ambulatory Visit: Payer: Self-pay | Admitting: *Deleted

## 2015-08-16 ENCOUNTER — Ambulatory Visit (INDEPENDENT_AMBULATORY_CARE_PROVIDER_SITE_OTHER): Payer: Medicare Other | Admitting: General Practice

## 2015-08-16 DIAGNOSIS — Z5181 Encounter for therapeutic drug level monitoring: Secondary | ICD-10-CM

## 2015-08-16 DIAGNOSIS — I4891 Unspecified atrial fibrillation: Secondary | ICD-10-CM | POA: Diagnosis not present

## 2015-08-16 LAB — POCT INR: INR: 1.9

## 2015-08-16 MED ORDER — DIAZEPAM 5 MG PO TABS: 5.0000 mg | ORAL_TABLET | Freq: Two times a day (BID) | ORAL | Status: DC | PRN
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
## 2015-08-16 NOTE — Progress Notes (Signed)
Pre visit review using our clinic review tool, if applicable. No additional management support is needed unless otherwise documented below in the visit note. 

## 2015-08-16 NOTE — Telephone Encounter (Signed)
Pt here needs refill on Diazepam, pharmacy said can not fill till 4/4. Discussed situation with Dr.K regarding pt, see other message. Dr. Kirtland BouchardK said okay to refill Diazepam 5 mg one tablet BID PRN. Rx printed and signed gave to Bailey MechCindy Boyd to give to granddaughter Marylene Landngela when she comes to pick her up.

## 2015-08-17 NOTE — Telephone Encounter (Signed)
Patient's grand-daughter, Karoline Caldwellngie picked patient up around 4:00 to take patient home.

## 2015-08-19 ENCOUNTER — Telehealth: Payer: Self-pay | Admitting: Internal Medicine

## 2015-08-19 NOTE — Telephone Encounter (Signed)
Listened to my voicemail, pt and her granddaughter Karoline Caldwellngie were both on the message and pt said that she is fine and not to believe anything her daughter [name] says. "I am fine". Pt sounds normal and calm on the phone.
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
## 2015-08-19 NOTE — Telephone Encounter (Signed)
Left message for Granddaughter Angie on her personal voicemail to call office regarding her grandmother and phone message.
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
## 2015-08-19 NOTE — Telephone Encounter (Signed)
Pt's granddaughter Karoline Caldwellngie called back. Told her the message we received from the daughter and I did not want to call pt just in case daughter would answer. Told her if pt needs to be seen just call back and schedule with any provider due to Dr.K is out of the office or if needs ED take her there.  Angie verbalized understanding and said no she will call and check on pt and if needs appt will call back or take to ED. Angie said thank you for calling her.

## 2015-08-30 ENCOUNTER — Encounter: Payer: Self-pay | Admitting: Internal Medicine

## 2015-08-30 ENCOUNTER — Ambulatory Visit (INDEPENDENT_AMBULATORY_CARE_PROVIDER_SITE_OTHER): Payer: Medicare Other | Admitting: Internal Medicine

## 2015-08-30 VITALS — BP 134/70 | HR 98 | Ht 65.0 in | Wt 158.2 lb

## 2015-08-30 DIAGNOSIS — I482 Chronic atrial fibrillation, unspecified: Secondary | ICD-10-CM

## 2015-08-30 DIAGNOSIS — Z950 Presence of cardiac pacemaker: Secondary | ICD-10-CM | POA: Diagnosis not present

## 2015-08-30 NOTE — Progress Notes (Signed)
HPI [name] returns today for followup. She is a very pleasant 82 year old woman with chronic atrial fibrillation, status post permanent pacemaker insertion, status post AV nodal ablation, with a history of diastolic heart failure. In the interim, she has been stable but has been bothered by problems with her family. She has a swollen right leg/foot after bumping it. She has no trouble putting weight on it. Allergies  Allergen Reactions  . Codeine Sulfate Other (See Comments)    REACTION: unspecified  . Simvastatin Other (See Comments)    REACTION: choking, acid reflux     Current Outpatient Prescriptions  Medication Sig Dispense Refill  . ACCU-CHEK SOFTCLIX LANCETS lancets TEST once daily 100 each 12  . acetaminophen (TYLENOL) 650 MG CR tablet Take 650 mg by mouth every 8 (eight) hours as needed for pain.    . Blood Glucose Monitoring Suppl (ACCU-CHEK AVIVA PLUS) W/DEVICE KIT Use as directed 1 kit 0  . diazepam (VALIUM) 5 MG tablet Take 1 tablet (5 mg total) by mouth 2 (two) times daily as needed. 60 tablet 2  . diltiazem (CARDIZEM CD) 300 MG 24 hr capsule take 1 capsule by mouth once daily 90 capsule 2  . ferrous sulfate 325 (65 FE) MG tablet take 1 tablet by mouth once daily WITH BREAKFAST 30 tablet 2  . furosemide (LASIX) 20 MG tablet Take 1 tablet (20 mg total) by mouth daily. 90 tablet 1  . glucose blood (ACCU-CHEK AVIVA PLUS) test strip 1 each by Other route daily as needed. 100 each 5  . hyoscyamine (LEVBID) 0.375 MG 12 hr tablet take 1 tablet by mouth twice a day ONLY IF NEEDED, **DO NOT TAKE EVERYDAY MUST LAST 30 DAYS** 30 tablet 2  . levothyroxine (SYNTHROID, LEVOTHROID) 75 MCG tablet take 1 tablet by mouth once daily 90 tablet 2  . meclizine (ANTIVERT) 25 MG tablet Take 1 tablet (25 mg total) by mouth 3 (three) times daily as needed for dizziness. 30 tablet 4  . metFORMIN (GLUCOPHAGE-XR) 500 MG 24 hr tablet Take 1 tablet (500 mg total) by mouth daily with breakfast. 90 tablet  3  . polyethylene glycol powder (GLYCOLAX/MIRALAX) powder Take 1 Container by mouth daily as needed (constipation).    . potassium chloride (K-DUR,KLOR-CON) 10 MEQ tablet take 1 tablet by mouth once daily 30 tablet 5  . telmisartan (MICARDIS) 80 MG tablet take 1 tablet by mouth once daily 90 tablet 2  . warfarin (COUMADIN) 2 MG tablet Take as directed by anticoagulation clinic 105 tablet 1   No current facility-administered medications for this visit.     Past Medical History  Diagnosis Date  . Osteoporosis   . Hyperlipidemia   . Tremor, essential   . CHF (congestive heart failure) ([place])   . Type II or unspecified type diabetes mellitus with neurological manifestations, not stated as uncontrolled   . Hypertension   . Thyroid disease   . Diverticulosis of colon   . Atrial fibrillation ([place])   . Anxiety   . DM (diabetes mellitus) ([place])   . Pain in left ear   . Hypothyroidism   . Atrial fibrillation ([place])   . Constipation     ROS:   All systems reviewed and negative except as noted in the HPI.   Past Surgical History  Procedure Laterality Date  . Cataract extraction  2002  . Pacemaker placement    . L3 compression fraction    . Transthoracic echocardiogram  2011, 2012     Family  History  Problem Relation Age of Onset  . Heart attack Father   . Colon cancer Neg Hx      Social History   Social History  . Marital Status: Unknown    Spouse Name: N/A  . Number of Children: N/A  . Years of Education: N/A   Occupational History  . Not on file.   Social History Main Topics  . Smoking status: Never Smoker   . Smokeless tobacco: Never Used  . Alcohol Use: No  . Drug Use: No  . Sexual Activity: Not on file   Other Topics Concern  . Not on file   Social History Narrative     BP 134/70 mmHg  Pulse 98  Ht '5\' 5"'$  (1.651 m)  Wt 158 lb 3.2 oz (71.759 kg)  BMI 26.33 kg/m2  SpO2 96%  Physical Exam:  Well appearing elderly woman,NAD HEENT:  Unremarkable Neck:  7 cm JVD, no thyromegally Back:  No CVA tenderness, mild kyphosis Lungs:  Clear with no wheezes, rales, or rhonchi. HEART:  Regular rate rhythm, no murmurs, no rubs, no clicks Abd:  soft, positive bowel sounds, no organomegally, no rebound, no guarding Ext:  2 plus pulses, no edema, no cyanosis, no clubbing Skin:  No rashes no nodules Neuro:  CN II through XII intact, motor grossly intact  ECG - atrial fibrillation with ventricular pacing at 70/min.  DEVICE  Normal device function.  See PaceArt for details.   Assess/Plan: 1. Atrial fib with a controlled VR - she is asymptomatic. She will continue her warfarin 2. HTN - her blood pressure is well controlled. Will follow. 3. Diastolic heart failure - her symptoms are class 2. She is encouraged to continue her low sodium diet.  4. PPM - her medtronic device is working normally. Will recheck in several months.   Mikle Bosworth.D.

## 2015-08-30 NOTE — Patient Instructions (Signed)
Medication Instructions:  Your physician recommends that you continue on your current medications as directed. Please refer to the Current Medication list given to you today.   Labwork: None ordered   Testing/Procedures: None ordered   Follow-Up: Your physician wants you to follow-up in: 12 months with Dr Taylor You will receive a reminder letter in the mail two months in advance. If you don't receive a letter, please call our office to schedule the follow-up appointment.   Remote monitoring is used to monitor your Pacemaker  from home. This monitoring reduces the number of office visits required to check your device to one time per year. It allows us to keep an eye on the functioning of your device to ensure it is working properly. You are scheduled for a device check from home on 11/29/15. You may send your transmission at any time that day. If you have a wireless device, the transmission will be sent automatically. After your physician reviews your transmission, you will receive a postcard with your next transmission date.    Any Other Special Instructions Will Be Listed Below (If Applicable).     If you need a refill on your cardiac medications before your next appointment, please call your pharmacy.   

## 2015-09-02 ENCOUNTER — Other Ambulatory Visit: Payer: Self-pay

## 2015-09-02 MED ORDER — MECLIZINE HCL 25 MG PO TABS: 25.0000 mg | ORAL_TABLET | Freq: Three times a day (TID) | ORAL | Status: DC | PRN

## 2015-09-09 ENCOUNTER — Other Ambulatory Visit: Payer: Self-pay | Admitting: *Deleted

## 2015-09-09 MED ORDER — FERROUS SULFATE 325 (65 FE) MG PO TABS: ORAL_TABLET | ORAL | Status: DC

## 2015-09-13 ENCOUNTER — Ambulatory Visit: Payer: Medicare Other

## 2015-09-15 ENCOUNTER — Ambulatory Visit (INDEPENDENT_AMBULATORY_CARE_PROVIDER_SITE_OTHER): Payer: Medicare Other | Admitting: General Practice

## 2015-09-15 DIAGNOSIS — Z5181 Encounter for therapeutic drug level monitoring: Secondary | ICD-10-CM | POA: Diagnosis not present

## 2015-09-15 LAB — POCT INR: INR: 2.2

## 2015-09-15 NOTE — Progress Notes (Signed)
Pre visit review using our clinic review tool, if applicable. No additional management support is needed unless otherwise documented below in the visit note. 

## 2015-10-10 ENCOUNTER — Emergency Department (HOSPITAL_COMMUNITY): Payer: Medicare Other

## 2015-10-10 ENCOUNTER — Encounter (HOSPITAL_COMMUNITY): Payer: Self-pay | Admitting: Emergency Medicine

## 2015-10-10 ENCOUNTER — Emergency Department (HOSPITAL_COMMUNITY)
Admission: EM | Admit: 2015-10-10 | Discharge: 2015-10-10 | Disposition: A | Discharge status: Home / Self Care | Payer: Medicare Other | Attending: Emergency Medicine | Admitting: Emergency Medicine

## 2015-10-10 DIAGNOSIS — I509 Heart failure, unspecified: Secondary | ICD-10-CM | POA: Insufficient documentation

## 2015-10-10 DIAGNOSIS — F419 Anxiety disorder, unspecified: Secondary | ICD-10-CM | POA: Insufficient documentation

## 2015-10-10 DIAGNOSIS — W01198A Fall on same level from slipping, tripping and stumbling with subsequent striking against other object, initial encounter: Secondary | ICD-10-CM | POA: Insufficient documentation

## 2015-10-10 DIAGNOSIS — Y998 Other external cause status: Secondary | ICD-10-CM | POA: Insufficient documentation

## 2015-10-10 DIAGNOSIS — W19XXXA Unspecified fall, initial encounter: Secondary | ICD-10-CM

## 2015-10-10 DIAGNOSIS — S39012A Strain of muscle, fascia and tendon of lower back, initial encounter: Secondary | ICD-10-CM | POA: Diagnosis not present

## 2015-10-10 DIAGNOSIS — S8002XA Contusion of left knee, initial encounter: Secondary | ICD-10-CM | POA: Diagnosis not present

## 2015-10-10 DIAGNOSIS — Z7984 Long term (current) use of oral hypoglycemic drugs: Secondary | ICD-10-CM | POA: Diagnosis not present

## 2015-10-10 DIAGNOSIS — E079 Disorder of thyroid, unspecified: Secondary | ICD-10-CM | POA: Insufficient documentation

## 2015-10-10 DIAGNOSIS — Y9389 Activity, other specified: Secondary | ICD-10-CM | POA: Diagnosis not present

## 2015-10-10 DIAGNOSIS — E039 Hypothyroidism, unspecified: Secondary | ICD-10-CM | POA: Diagnosis not present

## 2015-10-10 DIAGNOSIS — M25552 Pain in left hip: Secondary | ICD-10-CM | POA: Diagnosis not present

## 2015-10-10 DIAGNOSIS — I10 Essential (primary) hypertension: Secondary | ICD-10-CM | POA: Insufficient documentation

## 2015-10-10 DIAGNOSIS — E1149 Type 2 diabetes mellitus with other diabetic neurological complication: Secondary | ICD-10-CM | POA: Diagnosis not present

## 2015-10-10 DIAGNOSIS — Z8739 Personal history of other diseases of the musculoskeletal system and connective tissue: Secondary | ICD-10-CM | POA: Insufficient documentation

## 2015-10-10 DIAGNOSIS — Z7901 Long term (current) use of anticoagulants: Secondary | ICD-10-CM | POA: Diagnosis not present

## 2015-10-10 DIAGNOSIS — Z79899 Other long term (current) drug therapy: Secondary | ICD-10-CM | POA: Diagnosis not present

## 2015-10-10 DIAGNOSIS — K5900 Constipation, unspecified: Secondary | ICD-10-CM | POA: Insufficient documentation

## 2015-10-10 DIAGNOSIS — Y92009 Unspecified place in unspecified non-institutional (private) residence as the place of occurrence of the external cause: Secondary | ICD-10-CM | POA: Insufficient documentation

## 2015-10-10 DIAGNOSIS — S60222A Contusion of left hand, initial encounter: Secondary | ICD-10-CM | POA: Diagnosis not present

## 2015-10-10 DIAGNOSIS — S6992XA Unspecified injury of left wrist, hand and finger(s), initial encounter: Secondary | ICD-10-CM | POA: Diagnosis not present

## 2015-10-10 DIAGNOSIS — M545 Low back pain: Secondary | ICD-10-CM | POA: Diagnosis not present

## 2015-10-10 DIAGNOSIS — M79642 Pain in left hand: Secondary | ICD-10-CM | POA: Diagnosis not present

## 2015-10-10 DIAGNOSIS — M25551 Pain in right hip: Secondary | ICD-10-CM | POA: Diagnosis not present

## 2015-10-10 DIAGNOSIS — S3992XA Unspecified injury of lower back, initial encounter: Secondary | ICD-10-CM | POA: Diagnosis not present

## 2015-10-10 DIAGNOSIS — T148 Other injury of unspecified body region: Secondary | ICD-10-CM | POA: Diagnosis not present

## 2015-10-10 DIAGNOSIS — M25562 Pain in left knee: Secondary | ICD-10-CM | POA: Diagnosis not present

## 2015-10-10 LAB — PROTIME-INR
INR: 1.54 — AB (ref 0.00–1.49)
PROTHROMBIN TIME: 18.6 s — AB (ref 11.6–15.2)

## 2015-10-10 MED ORDER — TRAMADOL HCL 50 MG PO TABS: 50.0000 mg | ORAL_TABLET | Freq: Four times a day (QID) | ORAL | Status: DC | PRN

## 2015-10-10 NOTE — ED Provider Notes (Signed)
CSN: [id number]     Arrival date & time 10/10/15  1602 History   First MD Initiated Contact with Patient 10/10/15 1626     Chief Complaint  Patient presents with  . Fall     (Consider location/radiation/quality/duration/timing/severity/associated sxs/prior Treatment) Patient is a 82 y.o. female presenting with fall. The history is provided by the patient, a relative and the EMS personnel.  Fall Pertinent negatives include no chest pain, no abdominal pain, no headaches and no shortness of breath.  The patient status post fall at home tripped over a rug family members were present when down on her hands and knees. Brought in by EMS. Patient with complaint of left hand pain and left knee pain bilateral hip pain and low back pain. Patient denying any shoulder pain at this time. Patient did not strike her head there was no loss of consciousness there was no syncope. EMS reported bruising to left knee. Patient has a history of chronic back pain.  Past Medical History  Diagnosis Date  . Osteoporosis   . Hyperlipidemia   . Tremor, essential   . CHF (congestive heart failure) (HCC)   . Type II or unspecified type diabetes mellitus with neurological manifestations, not stated as uncontrolled   . Hypertension   . Thyroid disease   . Diverticulosis of colon   . Atrial fibrillation (HCC)   . Anxiety   . DM (diabetes mellitus) (HCC)   . Pain in left ear   . Hypothyroidism   . Atrial fibrillation (HCC)   . Constipation    Past Surgical History  Procedure Laterality Date  . Cataract extraction  2002  . Pacemaker placement    . L3 compression fraction    . Transthoracic echocardiogram  2011, 2012   Family History  Problem Relation Age of Onset  . Heart attack Father   . Colon cancer Neg Hx    Social History  Substance Use Topics  . Smoking status: Never Smoker   . Smokeless tobacco: Never Used  . Alcohol Use: No   OB History    No data available     Review of Systems   Constitutional: Negative for fever.  HENT: Negative for congestion.   Eyes: Negative for visual disturbance.  Respiratory: Negative for shortness of breath.   Cardiovascular: Negative for chest pain.  Gastrointestinal: Negative for abdominal pain.  Musculoskeletal: Positive for back pain. Negative for neck pain.  Skin: Positive for wound. Negative for rash.  Neurological: Negative for weakness, numbness and headaches.  Hematological: Bruises/bleeds easily.  Psychiatric/Behavioral: Positive for confusion.      Allergies  Codeine sulfate and Simvastatin  Home Medications   Prior to Admission medications   Medication Sig Start Date End Date Taking? Authorizing Provider  ACCU-CHEK SOFTCLIX LANCETS lancets TEST once daily 07/08/14   Gordy Savers, MD  acetaminophen (TYLENOL) 650 MG CR tablet Take 650 mg by mouth every 8 (eight) hours as needed for pain.    Historical Provider, MD  Blood Glucose Monitoring Suppl (ACCU-CHEK AVIVA PLUS) W/DEVICE KIT Use as directed 07/16/14   Terressa Koyanagi, DO  diazepam (VALIUM) 5 MG tablet Take 1 tablet (5 mg total) by mouth 2 (two) times daily as needed. 08/16/15   Gordy Savers, MD  diltiazem (CARDIZEM CD) 300 MG 24 hr capsule take 1 capsule by mouth once daily 06/02/15   Shelva Majestic, MD  ferrous sulfate 325 (65 FE) MG tablet take 1 tablet by mouth once daily WITH BREAKFAST 09/09/15  Marletta Lor, MD  furosemide (LASIX) 20 MG tablet Take 1 tablet (20 mg total) by mouth daily. 06/24/15   Marletta Lor, MD  glucose blood (ACCU-CHEK AVIVA PLUS) test strip 1 each by Other route daily as needed. 06/19/14   Marletta Lor, MD  hyoscyamine (LEVBID) 0.375 MG 12 hr tablet take 1 tablet by mouth twice a day ONLY IF NEEDED, **DO NOT TAKE EVERYDAY MUST LAST 30 DAYS** 06/30/15   Marletta Lor, MD  levothyroxine (SYNTHROID, LEVOTHROID) 75 MCG tablet take 1 tablet by mouth once daily 06/22/15   Marletta Lor, MD  meclizine (ANTIVERT)  25 MG tablet Take 1 tablet (25 mg total) by mouth 3 (three) times daily as needed for dizziness. 09/02/15   Marletta Lor, MD  metFORMIN (GLUCOPHAGE-XR) 500 MG 24 hr tablet Take 1 tablet (500 mg total) by mouth daily with breakfast. 12/08/14   Marletta Lor, MD  polyethylene glycol powder [facility]) powder Take 1 Container by mouth daily as needed (constipation).    Historical Provider, MD  potassium chloride (K-DUR,KLOR-CON) 10 MEQ tablet take 1 tablet by mouth once daily 11/19/14   Marletta Lor, MD  telmisartan (MICARDIS) 80 MG tablet take 1 tablet by mouth once daily 06/22/15   Marletta Lor, MD  traMADol (ULTRAM) 50 MG tablet Take 1 tablet (50 mg total) by mouth every 6 (six) hours as needed. 10/10/15   Fredia Sorrow, MD  warfarin (COUMADIN) 2 MG tablet Take as directed by anticoagulation clinic 05/10/15   Marletta Lor, MD   BP 129/66 mmHg  Pulse 72  Temp(Src) 99 F (37.2 C) (Oral)  Resp 18  Ht '5\' 4"'$  (1.626 m)  Wt 70.308 kg  BMI 26.59 kg/m2  SpO2 97% Physical Exam  Constitutional: She appears well-developed and well-nourished. No distress.  HENT:  Head: Normocephalic and atraumatic.  Mouth/Throat: Oropharynx is clear and moist.  Eyes: Conjunctivae are normal. Pupils are equal, round, and reactive to light.  Neck: Normal range of motion. Neck supple.  Cardiovascular: Normal rate, regular rhythm and normal heart sounds.   No murmur heard. Pulmonary/Chest: Effort normal and breath sounds normal. No respiratory distress.  Abdominal: Soft. Bowel sounds are normal. There is no tenderness.  Musculoskeletal: Normal range of motion. She exhibits tenderness.  Tenderness to palpation to the palm of the left hand. No sniffing swelling or contusion. Radial pulse 2+ good range of motion of the hand. Patient's left knee with contusion and some swelling no evidence of fusion. Patient with mild tenderness to palpation to both hips and low back.  Neurological: She  is alert. No cranial nerve deficit. She exhibits normal muscle tone. Coordination normal.  Skin: Skin is warm. No rash noted.  Nursing note and vitals reviewed.   ED Course  Procedures (including critical care time) Labs Review Labs Reviewed  PROTIME-INR - Abnormal; Notable for the following:    Prothrombin Time 18.6 (*)    INR 1.54 (*)    All other components within normal limits    Imaging Review Dg Lumbar Spine Complete  10/10/2015  CLINICAL DATA:  Status post fall after slipping on rug, with generalized lower back pain. Initial encounter. EXAM: LUMBAR SPINE - COMPLETE 4+ VIEW COMPARISON:  Lumbar spine radiographs performed 11/22/2014 FINDINGS: There is no evidence of acute fracture or subluxation. There is chronic loss of height at the superior endplate of L3. Vertebral bodies demonstrate normal alignment. Intervertebral disc spaces are preserved. The visualized neural foramina are grossly unremarkable  in appearance. The visualized bowel gas pattern is unremarkable in appearance; air and stool are noted within the colon. The sacroiliac joints are within normal limits. Diffuse vascular calcifications are seen. IMPRESSION: 1. No evidence of acute fracture or subluxation along the lumbar spine. 2. Stable chronic loss of height at the superior endplate of L3. 3. Diffuse vascular calcifications seen. Electronically Signed   By: Garald Balding M.D.   On: 10/10/2015 19:20   Dg Knee Complete 4 Views Left  10/10/2015  CLINICAL DATA:  Status post fall, with medial left patellar pain. Initial encounter. EXAM: LEFT KNEE - COMPLETE 4+ VIEW COMPARISON:  None. FINDINGS: There is no evidence of fracture or dislocation. The joint spaces are preserved. No significant degenerative change is seen; the patellofemoral joint is grossly unremarkable in appearance. No significant joint effusion is seen. Medial soft tissue swelling is noted. IMPRESSION: No evidence of fracture or dislocation. Electronically Signed    By: Garald Balding M.D.   On: 10/10/2015 19:21   Dg Hand Complete Left  10/10/2015  CLINICAL DATA:  Status post fall, with left anterior hand pain. Initial encounter. EXAM: LEFT HAND - COMPLETE 3+ VIEW COMPARISON:  None. FINDINGS: There is no evidence of fracture or dislocation. Mild degenerative change is noted at the first carpometacarpal joint. The carpal rows are intact, and demonstrate normal alignment. Soft tissue swelling is noted about the thumb. IMPRESSION: No evidence of fracture or dislocation. Electronically Signed   By: Garald Balding M.D.   On: 10/10/2015 19:24   Dg Hips Bilat With Pelvis 3-4 Views  10/10/2015  CLINICAL DATA:  Status post fall, with bilateral hip pain. Initial encounter. EXAM: DG HIP (WITH OR WITHOUT PELVIS) 3-4V BILAT COMPARISON:  None. FINDINGS: There is no evidence of fracture or dislocation. Both femoral heads are seated normally within their respective acetabula. The proximal femurs appear intact bilaterally. No significant degenerative change is appreciated. The sacroiliac joints are unremarkable in appearance. The visualized bowel gas pattern is grossly unremarkable in appearance. IMPRESSION: No evidence of fracture or dislocation. Electronically Signed   By: Garald Balding M.D.   On: 10/10/2015 19:22   I have personally reviewed and evaluated these images and lab results as part of my medical decision-making.   EKG Interpretation None      MDM   Final diagnoses:  Fall, initial encounter  Lumbar strain, initial encounter  Knee contusion, left, initial encounter  Hand contusion, left, initial encounter    Patient has post fall at home no syncope no loss of consciousness. Patient tripped over rug went down on her knees and hands. Patient with a complaint of left hand pain left knee pain low back pain and bilateral hip pain. X-rays of those areas without any evidence of any bony injury. Patient is on Coumadin she did not strike her head. But her Coumadin is  subtherapeutic. Patient seems to be confused about the Coumadin. she is to take. Lab her follow-up with her regular doctor for recheck of the INR and talk to her daughter about getting her back on her Coumadin.  Fredia Sorrow, MD 10/10/15 2151

## 2015-10-10 NOTE — ED Notes (Signed)
Pt ambulates independently and with steady gait at time of discharge. Discharge instructions and follow up information reviewed with patient. No other questions or concerns voiced at this time.  

## 2015-10-10 NOTE — ED Notes (Signed)
Ambulated patient approx 16400ft. To bathroom. Patient tolerated well and reported no respiratory distress. Patient initially unstable but ambulated well with one assist.

## 2015-10-10 NOTE — Discharge Instructions (Signed)
Coumadin level is subtherapeutic. The have it rechecked make sure she is taking her medicine as directed by her primary care doctor. Expect to be sore and stiff from the fall. Take the tramadol as needed.

## 2015-10-10 NOTE — ED Notes (Signed)
Pt here s/p fall. EMS reports pt tripped on a rug and hit her left shoulder. Pt denies LOC, dizziness, nausea. Pt initially c/o left hip pain, no obvious deformity. Bruising to left knee. Pt also c/o chronic back pain.

## 2015-10-13 ENCOUNTER — Telehealth: Payer: Self-pay | Admitting: Internal Medicine

## 2015-10-13 ENCOUNTER — Ambulatory Visit: Payer: Medicare Other

## 2015-10-13 NOTE — Telephone Encounter (Addendum)
FYI pt had a fall on Sunday and was seen at [facility].  Pt is taking tylenol for the pain

## 2015-10-15 LAB — CUP PACEART INCLINIC DEVICE CHECK
Battery Remaining Longevity: 57 mo
Brady Statistic RV Percent Paced: 100 %
Date Time Interrogation Session: 20170410160001
Implantable Lead Location: 753859
Implantable Lead Location: 753860
Lead Channel Sensing Intrinsic Amplitude: 15.68 mV
Lead Channel Setting Pacing Amplitude: 2.5 V
MDC IDC LEAD IMPLANT DT: 20031208
MDC IDC LEAD IMPLANT DT: 20031208
MDC IDC MSMT BATTERY IMPEDANCE: 950 Ohm
MDC IDC MSMT BATTERY VOLTAGE: 2.78 V
MDC IDC MSMT LEADCHNL RA IMPEDANCE VALUE: 67 Ohm
MDC IDC MSMT LEADCHNL RV IMPEDANCE VALUE: 668 Ohm
MDC IDC MSMT LEADCHNL RV PACING THRESHOLD AMPLITUDE: 0.75 V
MDC IDC MSMT LEADCHNL RV PACING THRESHOLD PULSEWIDTH: 0.4 ms
MDC IDC SET LEADCHNL RV PACING PULSEWIDTH: 0.4 ms
MDC IDC SET LEADCHNL RV SENSING SENSITIVITY: 1.4 mV

## 2015-10-20 ENCOUNTER — Ambulatory Visit (INDEPENDENT_AMBULATORY_CARE_PROVIDER_SITE_OTHER): Payer: Medicare Other | Admitting: General Practice

## 2015-10-20 DIAGNOSIS — I4891 Unspecified atrial fibrillation: Secondary | ICD-10-CM

## 2015-10-20 DIAGNOSIS — Z5181 Encounter for therapeutic drug level monitoring: Secondary | ICD-10-CM

## 2015-10-20 LAB — POCT INR: INR: 1.7

## 2015-10-20 NOTE — Progress Notes (Signed)
Pre visit review using our clinic review tool, if applicable. No additional management support is needed unless otherwise documented below in the visit note. 

## 2015-10-25 ENCOUNTER — Ambulatory Visit: Payer: Self-pay | Admitting: Family Medicine

## 2015-10-25 ENCOUNTER — Telehealth: Payer: Self-pay | Admitting: Internal Medicine

## 2015-10-25 NOTE — Telephone Encounter (Signed)
[facility] - Client TELEPHONE ADVICE RECORD [facility]  Patient Name: [name]  DOB: 05-16-1927    Initial Comment Caller states c/o severe back pain, said she had a fall x 2 wks ago   Nurse Assessment  Nurse: Dorthula RuePatten, RN, Enrique SackKendra Date/Time (Eastern Time): 10/25/2015 11:20:01 AM  Confirm and document reason for call. If symptomatic, describe symptoms. You must click the next button to save text entered. ---Caller states she has had back pain for 2 weeks. She states she fell 2 weeks ago. She was seen in the ER due to the pain. She said the x-rays did not show any breaks. She states she had a back spasm at 3am and it was really bad. She states she is having spells of back spasms  Has the patient traveled out of the country within the last 30 days? ---Not Applicable  Does the patient have any new or worsening symptoms? ---Yes  Will a triage be completed? ---Yes  Related visit to physician within the last 2 weeks? ---Yes  Does the PT have any chronic conditions? (i.e. diabetes, asthma, etc.) ---Yes  List chronic conditions. ---Diabetic, unknown(caller is rambling during the call)  Is this a behavioral health or substance abuse call? ---No     Guidelines    Guideline Title Affirmed Question Affirmed Notes  Back Pain [1] Abdominal pain AND [2] age > 6160    Final Disposition User   Go to ED Now (or PCP triage) Dorthula RuePatten, RN, Kendra    Comments  Scheduled today at 06/05 at 245pm   Referrals  REFERRED TO PCP OFFICE   Disagree/Comply: Comply

## 2015-10-25 NOTE — Telephone Encounter (Signed)
FYI: Pt on your schedule to be seen today

## 2015-10-26 ENCOUNTER — Ambulatory Visit (INDEPENDENT_AMBULATORY_CARE_PROVIDER_SITE_OTHER): Payer: Medicare Other | Admitting: Family Medicine

## 2015-10-26 ENCOUNTER — Encounter: Payer: Self-pay | Admitting: Family Medicine

## 2015-10-26 VITALS — BP 140/70 | HR 79 | Temp 98.2°F | Resp 20 | Ht 64.0 in | Wt 156.0 lb

## 2015-10-26 DIAGNOSIS — M545 Low back pain, unspecified: Secondary | ICD-10-CM

## 2015-10-26 MED ORDER — MECLIZINE HCL 25 MG PO TABS: 25.0000 mg | ORAL_TABLET | Freq: Three times a day (TID) | ORAL | Status: DC | PRN

## 2015-10-26 MED ORDER — TRAMADOL HCL 50 MG PO TABS: 50.0000 mg | ORAL_TABLET | Freq: Four times a day (QID) | ORAL | Status: DC | PRN

## 2015-10-26 NOTE — Progress Notes (Signed)
   Subjective:    Patient ID: Ventura BrunsMaxine F Mellinger, female    DOB: [birth year], 82 y.o.   MRN: [id number]  HPI Here to follow up an ER visit on 10-10-15 for a fall which occurred at home. Apparently no discrete explanation was found for the fall. No head trauma. She has a long hx of low back pain and at the ER she complained of back and chest and hand and knee pains. Xrays were al negative for fracture, including the LS spine. She was prescribed some Tramadol but it seems she never got this filled. Since then she has continued to complain of low back pain. She is taking only Tylenol BID.    Review of Systems  Constitutional: Negative.   Respiratory: Negative.   Cardiovascular: Negative.   Musculoskeletal: Positive for back pain.  Neurological: Negative.        Objective:   Physical Exam  Constitutional: She appears well-developed and well-nourished.  Cardiovascular: Normal rate, regular rhythm, normal heart sounds and intact distal pulses.   Pulmonary/Chest: Effort normal and breath sounds normal.  Musculoskeletal:  Tender over the lower back, full ROM   Neurological: She is alert.          Assessment & Plan:  Low back pain, probably due to arthritis. Given a small supply of Tramadol to use for pain. Recheck prn.

## 2015-10-26 NOTE — Progress Notes (Signed)
Pre visit review using our clinic review tool, if applicable. No additional management support is needed unless otherwise documented below in the visit note. 

## 2015-11-15 ENCOUNTER — Ambulatory Visit: Payer: Medicare Other

## 2015-11-17 ENCOUNTER — Telehealth: Payer: Self-pay | Admitting: Internal Medicine

## 2015-11-17 ENCOUNTER — Ambulatory Visit: Payer: Self-pay | Admitting: General Practice

## 2015-11-17 DIAGNOSIS — Z5181 Encounter for therapeutic drug level monitoring: Secondary | ICD-10-CM

## 2015-11-17 MED ORDER — DIAZEPAM 5 MG PO TABS: 5.0000 mg | ORAL_TABLET | Freq: Two times a day (BID) | ORAL | Status: DC | PRN

## 2015-11-17 MED ORDER — FERROUS SULFATE 325 (65 FE) MG PO TABS: ORAL_TABLET | ORAL | Status: DC

## 2015-11-17 NOTE — Telephone Encounter (Signed)
Pt request refill  ferrous sulfate 325 (65 FE) MG tablet diazepam (VALIUM) 5 MG tablet  [facility]   Pt wants cindy to be aware she is going to discontinue taking coum. Pt states cold all the time.

## 2015-11-17 NOTE — Telephone Encounter (Signed)
Cindy, please call pt with instructions on Coumadin. Thanks.

## 2015-11-17 NOTE — Telephone Encounter (Signed)
Spoke with patient and reiterated dosing instructions.  INR will be checked on 7/5.

## 2015-11-17 NOTE — Telephone Encounter (Signed)
Spoke to pt, told her Rx's were sent to pharmacy and that she can not stop her [facility]madin [facility] Pt verbalized understanding and asked how much she should be taking? Told pt I will have Cindy call her with instructions. Pt verbalized understanding.

## 2015-11-24 ENCOUNTER — Other Ambulatory Visit: Payer: Self-pay | Admitting: General Practice

## 2015-11-24 ENCOUNTER — Ambulatory Visit (INDEPENDENT_AMBULATORY_CARE_PROVIDER_SITE_OTHER): Payer: Medicare Other | Admitting: General Practice

## 2015-11-24 DIAGNOSIS — Z5181 Encounter for therapeutic drug level monitoring: Secondary | ICD-10-CM | POA: Diagnosis not present

## 2015-11-24 DIAGNOSIS — Z7901 Long term (current) use of anticoagulants: Secondary | ICD-10-CM | POA: Diagnosis not present

## 2015-11-24 DIAGNOSIS — I4891 Unspecified atrial fibrillation: Secondary | ICD-10-CM | POA: Diagnosis not present

## 2015-11-24 LAB — POCT INR: INR: 2.5

## 2015-11-24 MED ORDER — WARFARIN SODIUM 2 MG PO TABS: ORAL_TABLET | ORAL | Status: DC

## 2015-11-24 NOTE — Progress Notes (Signed)
Pre visit review using our clinic review tool, if applicable. No additional management support is needed unless otherwise documented below in the visit note. 

## 2015-11-26 ENCOUNTER — Other Ambulatory Visit: Payer: Self-pay | Admitting: *Deleted

## 2015-11-26 MED ORDER — METFORMIN HCL ER 500 MG PO TB24: 500.0000 mg | ORAL_TABLET | Freq: Every day | ORAL | Status: DC

## 2015-11-29 ENCOUNTER — Ambulatory Visit (INDEPENDENT_AMBULATORY_CARE_PROVIDER_SITE_OTHER): Payer: Medicare Other | Admitting: *Deleted

## 2015-11-29 ENCOUNTER — Telehealth: Payer: Self-pay | Admitting: Cardiology

## 2015-11-29 DIAGNOSIS — I482 Chronic atrial fibrillation, unspecified: Secondary | ICD-10-CM

## 2015-11-29 DIAGNOSIS — Z950 Presence of cardiac pacemaker: Secondary | ICD-10-CM | POA: Diagnosis not present

## 2015-11-29 NOTE — Telephone Encounter (Signed)
Spoke with pt and reminded pt of remote transmission that is due today. Pt verbalized understanding.   

## 2015-11-30 NOTE — Progress Notes (Signed)
Remote pacemaker transmission.   

## 2015-12-01 ENCOUNTER — Encounter: Payer: Self-pay | Admitting: Cardiology

## 2015-12-01 LAB — CUP PACEART REMOTE DEVICE CHECK
Battery Impedance: 1136 Ohm
Battery Voltage: 2.77 V
Date Time Interrogation Session: 20170711173001
Implantable Lead Location: 753859
Implantable Lead Model: 5092
Implantable Lead Model: 5594
Lead Channel Pacing Threshold Pulse Width: 0.4 ms
Lead Channel Setting Sensing Sensitivity: 1.4 mV
MDC IDC LEAD IMPLANT DT: 20031208
MDC IDC LEAD IMPLANT DT: 20031208
MDC IDC LEAD LOCATION: 753860
MDC IDC MSMT BATTERY REMAINING LONGEVITY: 51 mo
MDC IDC MSMT LEADCHNL RA IMPEDANCE VALUE: 67 Ohm
MDC IDC MSMT LEADCHNL RV IMPEDANCE VALUE: 694 Ohm
MDC IDC MSMT LEADCHNL RV PACING THRESHOLD AMPLITUDE: 0.75 V
MDC IDC SET LEADCHNL RV PACING AMPLITUDE: 2.5 V
MDC IDC SET LEADCHNL RV PACING PULSEWIDTH: 0.4 ms
MDC IDC STAT BRADY RV PERCENT PACED: 100 %

## 2015-12-20 ENCOUNTER — Other Ambulatory Visit: Payer: Self-pay | Admitting: Internal Medicine

## 2015-12-20 MED ORDER — GLUCOSE BLOOD VI STRP: 1.0000 | ORAL_STRIP | Freq: Every day | 2 refills | Status: DC | PRN

## 2015-12-20 NOTE — Telephone Encounter (Signed)
Called pharmacy to make sure they needed a meter. Rx's sent.

## 2015-12-22 ENCOUNTER — Telehealth: Payer: Self-pay | Admitting: Internal Medicine

## 2015-12-22 ENCOUNTER — Ambulatory Visit (INDEPENDENT_AMBULATORY_CARE_PROVIDER_SITE_OTHER): Payer: Medicare Other | Admitting: General Practice

## 2015-12-22 DIAGNOSIS — Z7901 Long term (current) use of anticoagulants: Secondary | ICD-10-CM

## 2015-12-22 DIAGNOSIS — Z5181 Encounter for therapeutic drug level monitoring: Secondary | ICD-10-CM

## 2015-12-22 DIAGNOSIS — I4891 Unspecified atrial fibrillation: Secondary | ICD-10-CM

## 2015-12-22 DIAGNOSIS — I82413 Acute embolism and thrombosis of femoral vein, bilateral: Secondary | ICD-10-CM

## 2015-12-22 LAB — POCT INR: INR: 2.1

## 2015-12-22 NOTE — Telephone Encounter (Signed)
Patient states she needs something for her back pain because it is hurting her so bad.  She states Diazepam don't help much.

## 2015-12-22 NOTE — Telephone Encounter (Signed)
Please schedule patient appointment with Dr. Kirtland Bouchard to discuss back pain.

## 2015-12-23 NOTE — Telephone Encounter (Signed)
Pts granddaughter called and will call back to schedule after talking with pt.

## 2015-12-23 NOTE — Telephone Encounter (Signed)
lmom for pt to call to make a appointment.

## 2015-12-24 ENCOUNTER — Encounter: Payer: Self-pay | Admitting: Internal Medicine

## 2015-12-28 NOTE — Telephone Encounter (Signed)
Spoke with patient and she states she is taking Tylenol for her back pain. She denies needing an appointment at this time. She states she has no wish to be placed on anything stronger than Tylenol. She will call back if she needs anything else.

## 2016-01-04 ENCOUNTER — Other Ambulatory Visit: Payer: Self-pay | Admitting: Internal Medicine

## 2016-01-20 NOTE — Progress Notes (Deleted)
Subjective:   [name] is a 82 y.o. female who presents for Medicare Annual (Subsequent) preventive examination.  HRA assessment completed during this visit with [name]   The Patient was informed that the wellness visit is to identify future health risk and educate and initiate measures that can reduce risk for increased disease through the lifespan.    NO ROS; Medicare Wellness Visit Last OV:  Acute visits in 2017 Labs completed:    Lifestyle review and risk:  PMH: DM2 Osteoporosis  HTN;  Hyperlipidemia   Psychosocial: (father had MI)   Tobacco: never smoked   How many drinks do you have per week?  none    Medications  BMI:   Diet;  Heart healthy? Fried foods? Sodium: Cholesterol Nutritional counseling given:  Teeth or Denture issues?   Exercise;  active vs sedentary What is the hardest physical activity you have done recently and for how long?   HOME SAFETY;  Fall hx;  Fall risk: Fear of falling?  Gait:  Given education on "Fall Prevention in the Home" for more safety tips the patient can apply as appropriate.  Long term goal is to "age in place" or undecided   Safety features reviewed for safe community; firearms if in the home; smoke alarms; sun protection when outside; driving difficulties or accidents  Mental Health:  Any emotional problems? Anxious, depressed, irritable, sad or blue?  Denies feeling depressed or hopeless; voices pleasure in daily life How many social activities have you been engaged in within the last 2 weeks? Who would help you with chores; illness; shopping other?  Pain:   Cognitive;  Manages checkbook, medications; no failures of task Ad8 score reviewed for issues;  Issues making decisions; no  Less interest in hobbies / activities" no  Repeats questions, stories; family complaining: NO  Trouble using ordinary gadgets; microwave; computer: no  Forgets the month or year: no  Mismanaging finances:  no  Missing apt: no but does write them down  Daily problems with thinking of memory NO Ad8 score is 0   Any dizziness when standing up?   Mobilization and Functional losses from last year to this year?   Sleep pattern changes;   Urinary or fecal incontinence reviewed: no  Hearing:  Right ear Left ear Difficulty hearing a whisper Tinnitus; American Tinn[employer] Association;   Ophthalmology exam Glaucoma screening Diabetic retinopathy if appropriate    Advanced Directive addressed; Completed or educated   Counseling Health Maintenance Gaps: DEXA Scan Eye Exam   Colonoscopy; aged out EKG:08/2015 Mammogram: 10/2011 Dexa/ not reported    Immunizations Due: (Vaccines reviewed and educated regarding any overdue)    Individual Goal:   Health Recommendations and Referrals  Barriers to Success    Current Care Team reviewed and updated         Objective:     Vitals: There were no vitals taken for this visit.  There is no height or weight on file to calculate BMI.   Tobacco History  Smoking Status  . Never Smoker  Smokeless Tobacco  . Never Used     Counseling given: Not Answered   Past Medical History:  Diagnosis Date  . Anxiety   . Atrial fibrillation ([place])   . Atrial fibrillation ([place])   . CHF (congestive heart failure) ([place])   . Constipation   . Diverticulosis of colon   . DM (diabetes mellitus) ([place])   . Hyperlipidemia   . Hypertension   . Hypothyroidism   .  Osteoporosis   . Pain in left ear   . Thyroid disease   . Tremor, essential   . Type II or unspecified type diabetes mellitus with neurological manifestations, not stated as uncontrolled    Past Surgical History:  Procedure Laterality Date  . CATARACT EXTRACTION  2002  . L3 compression fraction    . PACEMAKER PLACEMENT    . TRANSTHORACIC ECHOCARDIOGRAM  2011, 2012   Family History  Problem Relation Age of Onset  . Heart attack Father   . Colon cancer Neg Hx    History   Sexual Activity  . Sexual activity: Not on file    Outpatient Encounter Prescriptions as of 01/21/2016  Medication Sig  . ACCU-CHEK SOFTCLIX LANCETS lancets TEST once daily  . acetaminophen (TYLENOL) 650 MG CR tablet Take 650 mg by mouth every 8 (eight) hours as needed for pain.  . Blood Glucose Monitoring Suppl (ACCU-CHEK AVIVA PLUS) W/DEVICE KIT Use as directed  . Blood Glucose Monitoring Suppl (ACCU-CHEK AVIVA PLUS) w/Device KIT use as directed  . diazepam (VALIUM) 5 MG tablet Take 1 tablet (5 mg total) by mouth 2 (two) times daily as needed.  . diltiazem (CARDIZEM CD) 300 MG 24 hr capsule take 1 capsule by mouth once daily  . ferrous sulfate 325 (65 FE) MG tablet take 1 tablet by mouth once daily WITH BREAKFAST  . furosemide (LASIX) 20 MG tablet take 1 tablet by mouth once daily  . glucose blood (ACCU-CHEK AVIVA PLUS) test strip 1 each by Other route daily as needed.  . hyoscyamine (LEVBID) 0.375 MG 12 hr tablet take 1 tablet by mouth twice a day ONLY IF NEEDED  **DO NOT TAKE EVERYDAY MUST LAST 30 DAYS**  . levothyroxine (SYNTHROID, LEVOTHROID) 75 MCG tablet take 1 tablet by mouth once daily  . meclizine (ANTIVERT) 25 MG tablet Take 1 tablet (25 mg total) by mouth 3 (three) times daily as needed for dizziness.  . metFORMIN (GLUCOPHAGE-XR) 500 MG 24 hr tablet Take 1 tablet (500 mg total) by mouth daily with breakfast.  . polyethylene glycol powder (GLYCOLAX/MIRALAX) powder Take 1 Container by mouth daily as needed (constipation).  . potassium chloride (K-DUR,KLOR-CON) 10 MEQ tablet take 1 tablet by mouth once daily  . telmisartan (MICARDIS) 80 MG tablet take 1 tablet by mouth once daily  . traMADol (ULTRAM) 50 MG tablet Take 1 tablet (50 mg total) by mouth every 6 (six) hours as needed for moderate pain.  Marland Kitchen warfarin (COUMADIN) 2 MG tablet Take as directed by anticoagulation clinic   No facility-administered encounter medications on file as of 01/21/2016.     Activities of Daily  Living No flowsheet data found.  Patient Care Team: Marletta Lor, MD as PCP - General (Internal Medicine) Evans Lance, MD (Cardiology)    Assessment:    Education provided and lifestyle risk discussed   All Health Maintenance Gaps Reviewed for closure     Exercise Activities and Dietary recommendations    Goals    None     Fall Risk Fall Risk  07/16/2014 06/11/2013  Falls in the past year? No No   Depression Screen PHQ 2/9 Scores 07/16/2014 06/11/2013  PHQ - 2 Score 0 0     Cognitive Testing No flowsheet data found.  Immunization History  Administered Date(s) Administered  . Influenza Split 04/19/2011, 03/10/2013  . Influenza Whole 04/04/2006, 05/03/2007  . Influenza, High Dose Seasonal PF 02/11/2015  . Influenza,inj,Quad PF,36+ Mos 02/17/2014  . Influenza-Unspecified 04/01/2012  .  Pneumococcal Conjugate-13 04/24/2013  . Pneumococcal Polysaccharide-23 04/04/2006  . Td 05/22/2000, 04/24/2013  . Zoster 12/08/2014   Screening Tests Health Maintenance  Topic Date Due  . DEXA SCAN  03/17/1994  . OPHTHALMOLOGY EXAM  03/13/2015  . INFLUENZA VACCINE  12/21/2015  . HEMOGLOBIN A1C  12/29/2015  . FOOT EXAM  06/30/2016  . TETANUS/TDAP  04/25/2023  . ZOSTAVAX  Completed  . PNA vac Low Risk Adult  Completed      Plan:   *** During the course of the visit the patient was educated and counseled about the following appropriate screening and preventive services:   Vaccines to include Pneumoccal, Influenza, Hepatitis B, Td, Zostavax, HCV  Electrocardiogram  Cardiovascular Disease  Colorectal cancer screening  Bone density screening  Diabetes screening  Glaucoma screening  Mammography/PAP  Nutrition counseling   Patient Instructions (the written plan) was given to the patient.   Wynetta Fines, RN  01/20/2016

## 2016-01-21 ENCOUNTER — Ambulatory Visit: Payer: Medicare Other

## 2016-01-26 ENCOUNTER — Ambulatory Visit (INDEPENDENT_AMBULATORY_CARE_PROVIDER_SITE_OTHER): Payer: Medicare Other | Admitting: General Practice

## 2016-01-26 DIAGNOSIS — Z7901 Long term (current) use of anticoagulants: Secondary | ICD-10-CM

## 2016-01-26 DIAGNOSIS — Z5181 Encounter for therapeutic drug level monitoring: Secondary | ICD-10-CM

## 2016-01-26 DIAGNOSIS — I4891 Unspecified atrial fibrillation: Secondary | ICD-10-CM | POA: Diagnosis not present

## 2016-01-26 LAB — POCT INR: INR: 1.4

## 2016-02-02 ENCOUNTER — Ambulatory Visit: Payer: Medicare Other

## 2016-02-14 ENCOUNTER — Encounter: Payer: Self-pay | Admitting: Internal Medicine

## 2016-02-14 ENCOUNTER — Telehealth: Payer: Self-pay | Admitting: Internal Medicine

## 2016-02-14 NOTE — Telephone Encounter (Signed)
[name]  is returning [name]

## 2016-02-14 NOTE — Telephone Encounter (Signed)
° °  Pt daughter Doyle AskewSherry Hill call to say that Dr Kirtland BouchardK had written a note about pt calling police. Daughter is calling asking if there is a copy of that note in pt file and if so she is asking if that note can be faxed [phone].Marland Kitchen. She has been calling the police and they are charging her for making this false calls

## 2016-02-14 NOTE — Telephone Encounter (Signed)
Left detailed message on [name]'s personal voicemail that [name] called asking about a letter Dr.K wrote for the police and wanting copy. Left message I do not have a copy of any letter written for the police. Any questions please call me back.

## 2016-02-14 NOTE — Telephone Encounter (Signed)
[name] is POA and on DPR. [name] is not on DPR.

## 2016-02-14 NOTE — Telephone Encounter (Signed)
Spoke to [name], told her [name] called requesting letter about calls to the Police. I do not have any letter regarding that in her chart. [name] verbalized understanding and said that is fine don't worry about it.

## 2016-02-15 NOTE — Telephone Encounter (Signed)
Ms. [name] (daughter) at [phone] is calling to check on the status of the letter that was requested on 02/14/16 that needs to go to the police station in reference to her mother continuing to call the police.  I let daughter know that I am not able to speak with her concerning the pt due [facility]

## 2016-02-15 NOTE — Telephone Encounter (Signed)
Pt granddaughter would like donna to call her back before 9 am

## 2016-02-15 NOTE — Telephone Encounter (Signed)
Called [name] and spoke to her, she said pt called her last night all upset because Social Service came to the house yesterday. [name] asked if we called Social Service. Told her no we did not. [name] verbalized understanding and said okay thank you I didn't think so.

## 2016-02-16 NOTE — Telephone Encounter (Signed)
Maria AskewSherry Holmes called to ask if Dr Kirtland BouchardK will write another letter stating  pt is confused and gets mixed up about calling the police. Maria PenSherry states she remembers Dr Kirtland BouchardK writing and her picking up last year.  The police are going to start charging after 3 visits of coming out with nothing wrong.  Ok to fax [phone]

## 2016-02-17 NOTE — Telephone Encounter (Signed)
Tried to contact pt no answer

## 2016-02-17 NOTE — Telephone Encounter (Signed)
[name] called back because she had not heard anything. Advised sherry again she is not on DPR, but she insist she signed last year.  [name] states she will wait for a call back in the am.

## 2016-02-18 NOTE — Telephone Encounter (Signed)
Noted. [facility]. Left voicemail to call office back and speak with Marcelino DusterMichelle (self).

## 2016-02-18 NOTE — Telephone Encounter (Signed)
Spoke with [name] (daughter). I made her aware she is not on the patient's permission-to-release file and previous requests around the patient's care were not appropriate. In addition, I made her aware there can be no further discussion around requests or inquiries related to patient. The only option which allows us to communicate is via the patient when she gives us written permission. [name] verbalized understanding.    Any further communication with [name] must come to [place]le.

## 2016-02-27 ENCOUNTER — Encounter: Payer: Self-pay | Admitting: Internal Medicine

## 2016-02-28 NOTE — Telephone Encounter (Signed)
Spoke w/ pt grand daughter and informed her that it is ok to wait until Wednesday or Thursday to send remote transmission. Note made in appt note.

## 2016-02-29 ENCOUNTER — Ambulatory Visit (INDEPENDENT_AMBULATORY_CARE_PROVIDER_SITE_OTHER): Payer: Medicare Other | Admitting: *Deleted

## 2016-02-29 DIAGNOSIS — I482 Chronic atrial fibrillation, unspecified: Secondary | ICD-10-CM

## 2016-03-03 ENCOUNTER — Encounter: Payer: Self-pay | Admitting: Cardiology

## 2016-03-03 NOTE — Progress Notes (Signed)
Remote pacemaker transmission.   

## 2016-03-08 ENCOUNTER — Other Ambulatory Visit: Payer: Self-pay | Admitting: Family Medicine

## 2016-03-27 ENCOUNTER — Other Ambulatory Visit: Payer: Self-pay | Admitting: Internal Medicine

## 2016-03-28 LAB — CUP PACEART REMOTE DEVICE CHECK
Battery Impedance: 1215 Ohm
Battery Remaining Longevity: 48 mo
Battery Voltage: 2.77 V
Brady Statistic RV Percent Paced: 100 %
Implantable Lead Implant Date: 20031208
Implantable Lead Model: 5092
Implantable Lead Model: 5594
Lead Channel Impedance Value: 680 Ohm
Lead Channel Setting Pacing Amplitude: 2.5 V
Lead Channel Setting Pacing Pulse Width: 0.4 ms
Lead Channel Setting Sensing Sensitivity: 1.4 mV
MDC IDC LEAD IMPLANT DT: 20031208
MDC IDC LEAD LOCATION: 753859
MDC IDC LEAD LOCATION: 753860
MDC IDC MSMT LEADCHNL RA IMPEDANCE VALUE: 67 Ohm
MDC IDC MSMT LEADCHNL RV PACING THRESHOLD AMPLITUDE: 0.75 V
MDC IDC MSMT LEADCHNL RV PACING THRESHOLD PULSEWIDTH: 0.4 ms
MDC IDC PG IMPLANT DT: 20120104
MDC IDC SESS DTM: 20171012210005

## 2016-03-29 ENCOUNTER — Telehealth: Payer: Self-pay | Admitting: Internal Medicine

## 2016-03-29 NOTE — Telephone Encounter (Signed)
Generic Septra DS, #10 one twice daily

## 2016-03-29 NOTE — Telephone Encounter (Signed)
Please see message and advise 

## 2016-03-29 NOTE — Telephone Encounter (Signed)
Pt would like to have something for her poss UTI unable to get out due to the rain.   Pharm:  [facility]  [address]

## 2016-03-30 ENCOUNTER — Other Ambulatory Visit: Payer: Self-pay | Admitting: Internal Medicine

## 2016-03-30 MED ORDER — SULFAMETHOXAZOLE-TRIMETHOPRIM 800-160 MG PO TABS: 1.0000 | ORAL_TABLET | Freq: Two times a day (BID) | ORAL | 0 refills | Status: DC

## 2016-03-30 NOTE — Telephone Encounter (Signed)
Tried to contact pt, unable to reach her. Called and spoke to [place]Ang[place] pt's granddaughter. Told pt her pt called yesterday c/o urinary symptoms and I sent Rx to [facility] for her Bactrim DS one tablet twice a day x 5 days. Told her if her symptoms do not improve need to make an appointment. Marylene Landngela verbalized understanding and will let her know.

## 2016-04-19 DIAGNOSIS — R079 Chest pain, unspecified: Secondary | ICD-10-CM | POA: Diagnosis not present

## 2016-04-20 ENCOUNTER — Inpatient Hospital Stay (HOSPITAL_COMMUNITY)
Admission: EM | Admit: 2016-04-20 | Discharge: 2016-05-01 | DRG: 246 | Disposition: A | Discharge status: Skilled Nursing Facility | Payer: Medicare Other | Attending: Interventional Cardiology | Admitting: Interventional Cardiology

## 2016-04-20 ENCOUNTER — Encounter (HOSPITAL_COMMUNITY)
Admission: EM | Disposition: A | Discharge status: Skilled Nursing Facility | Payer: Self-pay | Source: Home / Self Care | Attending: Interventional Cardiology

## 2016-04-20 ENCOUNTER — Inpatient Hospital Stay (HOSPITAL_COMMUNITY): Payer: Medicare Other

## 2016-04-20 ENCOUNTER — Encounter (HOSPITAL_COMMUNITY): Payer: Self-pay

## 2016-04-20 ENCOUNTER — Emergency Department (HOSPITAL_COMMUNITY): Payer: Medicare Other

## 2016-04-20 DIAGNOSIS — I249 Acute ischemic heart disease, unspecified: Secondary | ICD-10-CM | POA: Diagnosis not present

## 2016-04-20 DIAGNOSIS — E039 Hypothyroidism, unspecified: Secondary | ICD-10-CM | POA: Diagnosis present

## 2016-04-20 DIAGNOSIS — E785 Hyperlipidemia, unspecified: Secondary | ICD-10-CM | POA: Diagnosis not present

## 2016-04-20 DIAGNOSIS — K5900 Constipation, unspecified: Secondary | ICD-10-CM | POA: Diagnosis present

## 2016-04-20 DIAGNOSIS — R0602 Shortness of breath: Secondary | ICD-10-CM

## 2016-04-20 DIAGNOSIS — I25119 Atherosclerotic heart disease of native coronary artery with unspecified angina pectoris: Secondary | ICD-10-CM | POA: Diagnosis present

## 2016-04-20 DIAGNOSIS — F419 Anxiety disorder, unspecified: Secondary | ICD-10-CM | POA: Diagnosis not present

## 2016-04-20 DIAGNOSIS — Z7984 Long term (current) use of oral hypoglycemic drugs: Secondary | ICD-10-CM | POA: Diagnosis not present

## 2016-04-20 DIAGNOSIS — I2510 Atherosclerotic heart disease of native coronary artery without angina pectoris: Secondary | ICD-10-CM | POA: Diagnosis not present

## 2016-04-20 DIAGNOSIS — I240 Acute coronary thrombosis not resulting in myocardial infarction: Secondary | ICD-10-CM | POA: Diagnosis not present

## 2016-04-20 DIAGNOSIS — E1149 Type 2 diabetes mellitus with other diabetic neurological complication: Secondary | ICD-10-CM | POA: Diagnosis not present

## 2016-04-20 DIAGNOSIS — I482 Chronic atrial fibrillation: Secondary | ICD-10-CM | POA: Diagnosis not present

## 2016-04-20 DIAGNOSIS — R748 Abnormal levels of other serum enzymes: Secondary | ICD-10-CM | POA: Diagnosis not present

## 2016-04-20 DIAGNOSIS — D649 Anemia, unspecified: Secondary | ICD-10-CM | POA: Diagnosis present

## 2016-04-20 DIAGNOSIS — G8929 Other chronic pain: Secondary | ICD-10-CM | POA: Diagnosis not present

## 2016-04-20 DIAGNOSIS — R2689 Other abnormalities of gait and mobility: Secondary | ICD-10-CM

## 2016-04-20 DIAGNOSIS — R531 Weakness: Secondary | ICD-10-CM | POA: Diagnosis not present

## 2016-04-20 DIAGNOSIS — R109 Unspecified abdominal pain: Secondary | ICD-10-CM

## 2016-04-20 DIAGNOSIS — I5021 Acute systolic (congestive) heart failure: Secondary | ICD-10-CM | POA: Diagnosis not present

## 2016-04-20 DIAGNOSIS — I214 Non-ST elevation (NSTEMI) myocardial infarction: Principal | ICD-10-CM | POA: Diagnosis present

## 2016-04-20 DIAGNOSIS — I4891 Unspecified atrial fibrillation: Secondary | ICD-10-CM | POA: Diagnosis present

## 2016-04-20 DIAGNOSIS — I509 Heart failure, unspecified: Secondary | ICD-10-CM | POA: Diagnosis not present

## 2016-04-20 DIAGNOSIS — I442 Atrioventricular block, complete: Secondary | ICD-10-CM

## 2016-04-20 DIAGNOSIS — I221 Subsequent ST elevation (STEMI) myocardial infarction of inferior wall: Secondary | ICD-10-CM | POA: Diagnosis not present

## 2016-04-20 DIAGNOSIS — I110 Hypertensive heart disease with heart failure: Secondary | ICD-10-CM | POA: Diagnosis not present

## 2016-04-20 DIAGNOSIS — I2721 Secondary pulmonary arterial hypertension: Secondary | ICD-10-CM | POA: Diagnosis present

## 2016-04-20 DIAGNOSIS — J9601 Acute respiratory failure with hypoxia: Secondary | ICD-10-CM | POA: Diagnosis not present

## 2016-04-20 DIAGNOSIS — R140 Abdominal distension (gaseous): Secondary | ICD-10-CM | POA: Diagnosis not present

## 2016-04-20 DIAGNOSIS — M810 Age-related osteoporosis without current pathological fracture: Secondary | ICD-10-CM | POA: Diagnosis not present

## 2016-04-20 DIAGNOSIS — Z7901 Long term (current) use of anticoagulants: Secondary | ICD-10-CM | POA: Diagnosis not present

## 2016-04-20 DIAGNOSIS — R1084 Generalized abdominal pain: Secondary | ICD-10-CM

## 2016-04-20 DIAGNOSIS — I5043 Acute on chronic combined systolic (congestive) and diastolic (congestive) heart failure: Secondary | ICD-10-CM | POA: Diagnosis present

## 2016-04-20 DIAGNOSIS — Z955 Presence of coronary angioplasty implant and graft: Secondary | ICD-10-CM

## 2016-04-20 DIAGNOSIS — Z9114 Patient's other noncompliance with medication regimen: Secondary | ICD-10-CM

## 2016-04-20 DIAGNOSIS — I213 ST elevation (STEMI) myocardial infarction of unspecified site: Secondary | ICD-10-CM | POA: Diagnosis not present

## 2016-04-20 DIAGNOSIS — R5381 Other malaise: Secondary | ICD-10-CM

## 2016-04-20 DIAGNOSIS — R0789 Other chest pain: Secondary | ICD-10-CM | POA: Diagnosis not present

## 2016-04-20 DIAGNOSIS — R079 Chest pain, unspecified: Secondary | ICD-10-CM | POA: Diagnosis not present

## 2016-04-20 DIAGNOSIS — Z79899 Other long term (current) drug therapy: Secondary | ICD-10-CM | POA: Diagnosis not present

## 2016-04-20 DIAGNOSIS — Z950 Presence of cardiac pacemaker: Secondary | ICD-10-CM | POA: Diagnosis not present

## 2016-04-20 DIAGNOSIS — I481 Persistent atrial fibrillation: Secondary | ICD-10-CM | POA: Diagnosis not present

## 2016-04-20 HISTORY — PX: CARDIAC CATHETERIZATION: SHX172

## 2016-04-20 LAB — POCT I-STAT 3, ART BLOOD GAS (G3+)
ACID-BASE DEFICIT: 2 mmol/L (ref 0.0–2.0)
Acid-base deficit: 2 mmol/L (ref 0.0–2.0)
Bicarbonate: 22.4 mmol/L (ref 20.0–28.0)
Bicarbonate: 23.7 mmol/L (ref 20.0–28.0)
O2 Saturation: 89 %
O2 Saturation: 96 %
PCO2 ART: 44.2 mmHg (ref 32.0–48.0)
PH ART: 7.41 (ref 7.350–7.450)
PH ART: 7.337 — AB (ref 7.350–7.450)
PO2 ART: 84 mmHg (ref 83.0–108.0)
TCO2: 23 mmol/L (ref 0–100)
TCO2: 25 mmol/L (ref 0–100)
pCO2 arterial: 35.4 mmHg (ref 32.0–48.0)
pO2, Arterial: 56 mmHg — ABNORMAL LOW (ref 83.0–108.0)

## 2016-04-20 LAB — I-STAT TROPONIN, ED: TROPONIN I, POC: 0.82 ng/mL — AB (ref 0.00–0.08)

## 2016-04-20 LAB — CBC WITH DIFFERENTIAL/PLATELET
BASOS PCT: 0 %
Basophils Absolute: 0 10*3/uL (ref 0.0–0.1)
EOS ABS: 0.2 10*3/uL (ref 0.0–0.7)
Eosinophils Relative: 1 %
HEMATOCRIT: 39.2 % (ref 36.0–46.0)
HEMOGLOBIN: 13.2 g/dL (ref 12.0–15.0)
LYMPHS ABS: 2.8 10*3/uL (ref 0.7–4.0)
Lymphocytes Relative: 23 %
MCH: 33 pg (ref 26.0–34.0)
MCHC: 33.7 g/dL (ref 30.0–36.0)
MCV: 98 fL (ref 78.0–100.0)
MONO ABS: 0.9 10*3/uL (ref 0.1–1.0)
MONOS PCT: 7 %
NEUTROS ABS: 8.1 10*3/uL — AB (ref 1.7–7.7)
NEUTROS PCT: 69 %
Platelets: 131 10*3/uL — ABNORMAL LOW (ref 150–400)
RBC: 4 MIL/uL (ref 3.87–5.11)
RDW: 13.6 % (ref 11.5–15.5)
WBC: 11.9 10*3/uL — ABNORMAL HIGH (ref 4.0–10.5)

## 2016-04-20 LAB — I-STAT CHEM 8, ED
BUN: 17 mg/dL (ref 6–20)
CREATININE: 1.2 mg/dL — AB (ref 0.44–1.00)
Calcium, Ion: 1.24 mmol/L (ref 1.15–1.40)
Chloride: 101 mmol/L (ref 101–111)
Glucose, Bld: 237 mg/dL — ABNORMAL HIGH (ref 65–99)
HEMATOCRIT: 40 % (ref 36.0–46.0)
HEMOGLOBIN: 13.6 g/dL (ref 12.0–15.0)
POTASSIUM: 3.5 mmol/L (ref 3.5–5.1)
Sodium: 137 mmol/L (ref 135–145)
TCO2: 25 mmol/L (ref 0–100)

## 2016-04-20 LAB — BASIC METABOLIC PANEL
ANION GAP: 10 (ref 5–15)
Anion gap: 12 (ref 5–15)
BUN: 14 mg/dL (ref 6–20)
BUN: 17 mg/dL (ref 6–20)
CO2: 24 mmol/L (ref 22–32)
CO2: 24 mmol/L (ref 22–32)
CREATININE: 1.34 mg/dL — AB (ref 0.44–1.00)
Calcium: 9.9 mg/dL (ref 8.9–10.3)
Calcium: 9.5 mg/dL (ref 8.9–10.3)
Chloride: 102 mmol/L (ref 101–111)
Chloride: 99 mmol/L — ABNORMAL LOW (ref 101–111)
Creatinine, Ser: 1.23 mg/dL — ABNORMAL HIGH (ref 0.44–1.00)
GFR calc Af Amer: 44 mL/min — ABNORMAL LOW (ref >60)
GFR calc Af Amer: 40 mL/min — ABNORMAL LOW (ref >60)
GFR, EST NON AFRICAN AMERICAN: 38 mL/min — AB (ref >60)
GFR, EST NON AFRICAN AMERICAN: 35 mL/min — AB (ref >60)
GLUCOSE: 198 mg/dL — AB (ref 65–99)
GLUCOSE: 187 mg/dL — AB (ref 65–99)
POTASSIUM: 3.7 mmol/L (ref 3.5–5.1)
POTASSIUM: 3.6 mmol/L (ref 3.5–5.1)
SODIUM: 136 mmol/L (ref 135–145)
SODIUM: 135 mmol/L (ref 135–145)

## 2016-04-20 LAB — COMPREHENSIVE METABOLIC PANEL
ALK PHOS: 86 U/L (ref 38–126)
ALT: 13 U/L — ABNORMAL LOW (ref 14–54)
ANION GAP: 11 (ref 5–15)
AST: 33 U/L (ref 15–41)
Albumin: 3.7 g/dL (ref 3.5–5.0)
BILIRUBIN TOTAL: 0.5 mg/dL (ref 0.3–1.2)
BUN: 14 mg/dL (ref 6–20)
CALCIUM: 10.1 mg/dL (ref 8.9–10.3)
CO2: 23 mmol/L (ref 22–32)
Chloride: 102 mmol/L (ref 101–111)
Creatinine, Ser: 1.22 mg/dL — ABNORMAL HIGH (ref 0.44–1.00)
GFR calc non Af Amer: 39 mL/min — ABNORMAL LOW (ref >60)
GFR, EST AFRICAN AMERICAN: 45 mL/min — AB (ref >60)
Glucose, Bld: 236 mg/dL — ABNORMAL HIGH (ref 65–99)
POTASSIUM: 3.3 mmol/L — AB (ref 3.5–5.1)
Sodium: 136 mmol/L (ref 135–145)
TOTAL PROTEIN: 6.9 g/dL (ref 6.5–8.1)

## 2016-04-20 LAB — SURGICAL PCR SCREEN
MRSA, PCR: NEGATIVE
Staphylococcus aureus: NEGATIVE

## 2016-04-20 LAB — I-STAT CG4 LACTIC ACID, ED: LACTIC ACID, VENOUS: 2.92 mmol/L — AB (ref 0.5–1.9)

## 2016-04-20 LAB — PROTIME-INR
INR: 1.09
Prothrombin Time: 14.2 seconds (ref 11.4–15.2)

## 2016-04-20 LAB — TROPONIN I
TROPONIN I: 5.28 ng/mL — AB (ref <0.03)
Troponin I: 52.69 ng/mL (ref <0.03)
Troponin I: 42.28 ng/mL (ref <0.03)

## 2016-04-20 LAB — BRAIN NATRIURETIC PEPTIDE: B NATRIURETIC PEPTIDE 5: 376.4 pg/mL — AB (ref 0.0–100.0)

## 2016-04-20 LAB — MAGNESIUM: MAGNESIUM: 1.7 mg/dL (ref 1.7–2.4)

## 2016-04-20 LAB — TSH: TSH: 2.742 u[IU]/mL (ref 0.350–4.500)

## 2016-04-20 LAB — GLUCOSE, CAPILLARY
GLUCOSE-CAPILLARY: 214 mg/dL — AB (ref 65–99)
Glucose-Capillary: 161 mg/dL — ABNORMAL HIGH (ref 65–99)

## 2016-04-20 SURGERY — LEFT HEART CATH AND CORONARY ANGIOGRAPHY

## 2016-04-20 MED ORDER — LEVOTHYROXINE SODIUM 75 MCG PO TABS
75.0000 ug | ORAL_TABLET | Freq: Every day | ORAL | Status: DC
  Administered 2016-04-21 – 2016-05-01 (×11): 75 ug via ORAL
  Filled 2016-04-20 (×13): qty 1

## 2016-04-20 MED ORDER — DILTIAZEM HCL ER COATED BEADS 180 MG PO CP24
300.0000 mg | ORAL_CAPSULE | Freq: Every day | ORAL | Status: DC
  Filled 2016-04-20 (×2): qty 1

## 2016-04-20 MED ORDER — HEPARIN (PORCINE) IN NACL 2-0.9 UNIT/ML-% IJ SOLN
INTRAMUSCULAR | Status: AC
  Filled 2016-04-20: qty 1000

## 2016-04-20 MED ORDER — HEPARIN BOLUS VIA INFUSION
3500.0000 [IU] | Freq: Once | INTRAVENOUS | Status: AC
  Administered 2016-04-20: 3500 [IU] via INTRAVENOUS
  Filled 2016-04-20: qty 3500

## 2016-04-20 MED ORDER — WARFARIN SODIUM 2 MG PO TABS: 2.0000 mg | ORAL_TABLET | Freq: Every day | ORAL | Status: DC

## 2016-04-20 MED ORDER — FUROSEMIDE 10 MG/ML IJ SOLN
INTRAMUSCULAR | Status: AC
  Filled 2016-04-20: qty 4

## 2016-04-20 MED ORDER — ACETAMINOPHEN 325 MG PO TABS
650.0000 mg | ORAL_TABLET | Freq: Three times a day (TID) | ORAL | Status: DC | PRN
  Administered 2016-04-23: 650 mg via ORAL
  Filled 2016-04-20: qty 2

## 2016-04-20 MED ORDER — SODIUM CHLORIDE 0.9 % WEIGHT BASED INFUSION: 3.0000 mL/kg/h | INTRAVENOUS | Status: DC

## 2016-04-20 MED ORDER — HEPARIN (PORCINE) IN NACL 100-0.45 UNIT/ML-% IJ SOLN
750.0000 [IU]/h | INTRAMUSCULAR | Status: DC
  Administered 2016-04-20: 750 [IU]/h via INTRAVENOUS
  Filled 2016-04-20: qty 250

## 2016-04-20 MED ORDER — POLYETHYLENE GLYCOL 3350 17 G PO PACK
17.0000 g | PACK | Freq: Every day | ORAL | Status: DC | PRN
  Administered 2016-04-23 – 2016-04-25 (×2): 17 g via ORAL
  Filled 2016-04-20 (×2): qty 1

## 2016-04-20 MED ORDER — TRAMADOL HCL 50 MG PO TABS
50.0000 mg | ORAL_TABLET | Freq: Four times a day (QID) | ORAL | Status: DC | PRN
  Administered 2016-04-26 – 2016-05-01 (×7): 50 mg via ORAL
  Filled 2016-04-20 (×7): qty 1

## 2016-04-20 MED ORDER — HEPARIN (PORCINE) IN NACL 100-0.45 UNIT/ML-% IJ SOLN
1250.0000 [IU]/h | INTRAMUSCULAR | Status: DC
  Administered 2016-04-20: 750 [IU]/h via INTRAVENOUS
  Administered 2016-04-21: 900 [IU]/h via INTRAVENOUS
  Administered 2016-04-22 – 2016-04-23 (×2): 1100 [IU]/h via INTRAVENOUS
  Filled 2016-04-20 (×2): qty 250

## 2016-04-20 MED ORDER — ASPIRIN 81 MG PO CHEW: 81.0000 mg | CHEWABLE_TABLET | ORAL | Status: DC

## 2016-04-20 MED ORDER — LIDOCAINE HCL (PF) 1 % IJ SOLN
INTRAMUSCULAR | Status: DC | PRN
  Administered 2016-04-20: 2 mL

## 2016-04-20 MED ORDER — MAGNESIUM SULFATE 2 GM/50ML IV SOLN
2.0000 g | Freq: Once | INTRAVENOUS | Status: AC
  Administered 2016-04-20: 2 g via INTRAVENOUS
  Filled 2016-04-20: qty 50

## 2016-04-20 MED ORDER — ASPIRIN EC 81 MG PO TBEC
81.0000 mg | DELAYED_RELEASE_TABLET | Freq: Every day | ORAL | Status: DC
  Administered 2016-04-21 – 2016-05-01 (×10): 81 mg via ORAL
  Filled 2016-04-20 (×12): qty 1

## 2016-04-20 MED ORDER — SODIUM CHLORIDE 0.9% FLUSH: 3.0000 mL | Freq: Two times a day (BID) | INTRAVENOUS | Status: DC

## 2016-04-20 MED ORDER — SODIUM CHLORIDE 0.9 % IV SOLN: 250.0000 mL | INTRAVENOUS | Status: DC | PRN

## 2016-04-20 MED ORDER — DEXTROSE 5 % IV SOLN: 2.0000 g | Freq: Once | INTRAVENOUS | Status: DC

## 2016-04-20 MED ORDER — DIAZEPAM 2 MG PO TABS: 2.0000 mg | ORAL_TABLET | Freq: Two times a day (BID) | ORAL | Status: DC | PRN

## 2016-04-20 MED ORDER — FENTANYL CITRATE (PF) 100 MCG/2ML IJ SOLN
INTRAMUSCULAR | Status: DC | PRN
  Administered 2016-04-20: 12.5 ug via INTRAVENOUS

## 2016-04-20 MED ORDER — HEPARIN SODIUM (PORCINE) 1000 UNIT/ML IJ SOLN
INTRAMUSCULAR | Status: AC
  Filled 2016-04-20: qty 1

## 2016-04-20 MED ORDER — SODIUM CHLORIDE 0.9% FLUSH: 3.0000 mL | INTRAVENOUS | Status: DC | PRN

## 2016-04-20 MED ORDER — IOPAMIDOL (ISOVUE-370) INJECTION 76%
INTRAVENOUS | Status: DC | PRN
  Administered 2016-04-20: 30 mL

## 2016-04-20 MED ORDER — ATORVASTATIN CALCIUM 40 MG PO TABS
40.0000 mg | ORAL_TABLET | Freq: Every day | ORAL | Status: DC
  Administered 2016-04-20 – 2016-04-23 (×4): 40 mg via ORAL
  Filled 2016-04-20 (×4): qty 1

## 2016-04-20 MED ORDER — LIDOCAINE HCL (PF) 1 % IJ SOLN
INTRAMUSCULAR | Status: AC
  Filled 2016-04-20: qty 30

## 2016-04-20 MED ORDER — ASPIRIN 325 MG PO TABS
325.0000 mg | ORAL_TABLET | Freq: Once | ORAL | Status: AC
  Administered 2016-04-20: 325 mg via ORAL
  Filled 2016-04-20: qty 1

## 2016-04-20 MED ORDER — METFORMIN HCL ER 500 MG PO TB24
500.0000 mg | ORAL_TABLET | Freq: Every day | ORAL | Status: DC
  Filled 2016-04-20: qty 1

## 2016-04-20 MED ORDER — FERROUS SULFATE 325 (65 FE) MG PO TABS
325.0000 mg | ORAL_TABLET | Freq: Every day | ORAL | Status: DC
  Administered 2016-04-21 – 2016-05-01 (×11): 325 mg via ORAL
  Filled 2016-04-20 (×12): qty 1

## 2016-04-20 MED ORDER — MECLIZINE HCL 25 MG PO TABS: 25.0000 mg | ORAL_TABLET | Freq: Three times a day (TID) | ORAL | Status: DC | PRN

## 2016-04-20 MED ORDER — SODIUM CHLORIDE 0.9 % WEIGHT BASED INFUSION: 1.0000 mL/kg/h | INTRAVENOUS | Status: DC

## 2016-04-20 MED ORDER — NITROGLYCERIN 0.4 MG SL SUBL: 0.4000 mg | SUBLINGUAL_TABLET | SUBLINGUAL | Status: DC | PRN

## 2016-04-20 MED ORDER — VANCOMYCIN HCL 10 G IV SOLR
1500.0000 mg | Freq: Once | INTRAVENOUS | Status: DC
  Filled 2016-04-20: qty 1500

## 2016-04-20 MED ORDER — HEPARIN (PORCINE) IN NACL 2-0.9 UNIT/ML-% IJ SOLN
INTRAMUSCULAR | Status: DC | PRN
  Administered 2016-04-20: 1000 mL

## 2016-04-20 MED ORDER — HEPARIN SODIUM (PORCINE) 1000 UNIT/ML IJ SOLN
INTRAMUSCULAR | Status: DC | PRN
  Administered 2016-04-20: 3000 [IU] via INTRAVENOUS

## 2016-04-20 MED ORDER — NITROGLYCERIN 1 MG/10 ML FOR IR/CATH LAB: INTRA_ARTERIAL | Status: DC | PRN

## 2016-04-20 MED ORDER — IRBESARTAN 150 MG PO TABS
150.0000 mg | ORAL_TABLET | Freq: Every day | ORAL | Status: DC
  Administered 2016-04-21 – 2016-04-24 (×4): 150 mg via ORAL
  Filled 2016-04-20 (×6): qty 1

## 2016-04-20 MED ORDER — NITROGLYCERIN IN D5W 200-5 MCG/ML-% IV SOLN
0.0000 ug/min | INTRAVENOUS | Status: DC
  Filled 2016-04-20: qty 250

## 2016-04-20 MED ORDER — HYOSCYAMINE SULFATE ER 0.375 MG PO TB12
0.3750 mg | ORAL_TABLET | Freq: Two times a day (BID) | ORAL | Status: DC | PRN
  Filled 2016-04-20: qty 1

## 2016-04-20 MED ORDER — ORAL CARE MOUTH RINSE
15.0000 mL | Freq: Two times a day (BID) | OROMUCOSAL | Status: DC
  Administered 2016-04-21 – 2016-05-01 (×15): 15 mL via OROMUCOSAL

## 2016-04-20 MED ORDER — FUROSEMIDE 10 MG/ML IJ SOLN
80.0000 mg | Freq: Once | INTRAMUSCULAR | Status: AC
  Administered 2016-04-20: 80 mg via INTRAVENOUS
  Filled 2016-04-20: qty 8

## 2016-04-20 MED ORDER — HEPARIN (PORCINE) IN NACL 2-0.9 UNIT/ML-% IJ SOLN
INTRAMUSCULAR | Status: DC | PRN
  Administered 2016-04-20: 10 mL via INTRA_ARTERIAL

## 2016-04-20 MED ORDER — ONDANSETRON HCL 4 MG/2ML IJ SOLN
4.0000 mg | Freq: Four times a day (QID) | INTRAMUSCULAR | Status: DC | PRN
  Administered 2016-04-20: 4 mg via INTRAVENOUS
  Filled 2016-04-20: qty 2

## 2016-04-20 MED ORDER — FUROSEMIDE 10 MG/ML IJ SOLN
40.0000 mg | Freq: Two times a day (BID) | INTRAMUSCULAR | Status: DC
  Administered 2016-04-20 – 2016-04-23 (×7): 40 mg via INTRAVENOUS
  Filled 2016-04-20 (×6): qty 4

## 2016-04-20 MED ORDER — IOPAMIDOL (ISOVUE-370) INJECTION 76%
INTRAVENOUS | Status: AC
  Filled 2016-04-20: qty 100

## 2016-04-20 MED ORDER — INSULIN ASPART 100 UNIT/ML ~~LOC~~ SOLN
0.0000 [IU] | Freq: Three times a day (TID) | SUBCUTANEOUS | Status: DC
  Administered 2016-04-20: 3 [IU] via SUBCUTANEOUS
  Administered 2016-04-21: 1 [IU] via SUBCUTANEOUS
  Administered 2016-04-21 – 2016-04-22 (×2): 2 [IU] via SUBCUTANEOUS
  Administered 2016-04-22 – 2016-04-24 (×5): 1 [IU] via SUBCUTANEOUS
  Administered 2016-04-25 (×2): 2 [IU] via SUBCUTANEOUS
  Administered 2016-04-25: 1 [IU] via SUBCUTANEOUS
  Administered 2016-04-26: 2 [IU] via SUBCUTANEOUS
  Administered 2016-04-26 – 2016-04-28 (×5): 1 [IU] via SUBCUTANEOUS
  Administered 2016-04-28: 2 [IU] via SUBCUTANEOUS
  Administered 2016-04-28 – 2016-04-29 (×3): 1 [IU] via SUBCUTANEOUS
  Administered 2016-04-29: 2 [IU] via SUBCUTANEOUS
  Administered 2016-04-30 (×2): 1 [IU] via SUBCUTANEOUS
  Administered 2016-05-01: 2 [IU] via SUBCUTANEOUS

## 2016-04-20 MED ORDER — FUROSEMIDE 20 MG PO TABS
20.0000 mg | ORAL_TABLET | Freq: Every day | ORAL | Status: DC
  Filled 2016-04-20: qty 1

## 2016-04-20 MED ORDER — VERAPAMIL HCL 2.5 MG/ML IV SOLN
INTRAVENOUS | Status: AC
  Filled 2016-04-20: qty 2

## 2016-04-20 MED ORDER — FENTANYL CITRATE (PF) 100 MCG/2ML IJ SOLN
INTRAMUSCULAR | Status: AC
  Filled 2016-04-20: qty 2

## 2016-04-20 MED ORDER — SODIUM CHLORIDE 0.9% FLUSH
3.0000 mL | Freq: Two times a day (BID) | INTRAVENOUS | Status: DC
Start: 2016-04-20 — End: 2016-04-20

## 2016-04-20 MED ORDER — NITROGLYCERIN IN D5W 200-5 MCG/ML-% IV SOLN
INTRAVENOUS | Status: AC
  Filled 2016-04-20: qty 250

## 2016-04-20 MED ORDER — POTASSIUM CHLORIDE CRYS ER 10 MEQ PO TBCR
10.0000 meq | EXTENDED_RELEASE_TABLET | Freq: Every day | ORAL | Status: DC
  Administered 2016-04-20 – 2016-05-01 (×12): 10 meq via ORAL
  Filled 2016-04-20 (×13): qty 1

## 2016-04-20 MED ORDER — NITROGLYCERIN IN D5W 200-5 MCG/ML-% IV SOLN
0.0000 ug/min | Freq: Once | INTRAVENOUS | Status: AC
  Administered 2016-04-20: 10 ug/min via INTRAVENOUS

## 2016-04-20 MED ORDER — POTASSIUM CHLORIDE CRYS ER 20 MEQ PO TBCR
40.0000 meq | EXTENDED_RELEASE_TABLET | Freq: Once | ORAL | Status: AC
  Administered 2016-04-20: 40 meq via ORAL
  Filled 2016-04-20: qty 2

## 2016-04-20 SURGICAL SUPPLY — 12 items
CATH IMPULSE 5F ANG/FL3.5 (CATHETERS) ×2 IMPLANT
DEVICE RAD COMP TR BAND LRG (VASCULAR PRODUCTS) ×2 IMPLANT
GLIDESHEATH INTRODUCER 6F 10CM (SHEATH) ×2 IMPLANT
GUIDEWIRE INQWIRE 1.5J.035X260 (WIRE) IMPLANT
INQWIRE 1.5J .035X260CM (WIRE) ×3
KIT HEART LEFT (KITS) ×3 IMPLANT
NDL PERC 21GX4CM (NEEDLE) IMPLANT
NEEDLE PERC 21GX4CM (NEEDLE) ×3 IMPLANT
PACK CARDIAC CATHETERIZATION (CUSTOM PROCEDURE TRAY) ×3 IMPLANT
TRANSDUCER W/STOPCOCK (MISCELLANEOUS) ×3 IMPLANT
TUBING CIL FLEX 10 FLL-RA (TUBING) ×3 IMPLANT
WIRE HI TORQ VERSACORE-J 145CM (WIRE) ×2 IMPLANT

## 2016-04-20 NOTE — Interval H&P Note (Signed)
History and Physical Interval Note:  04/20/2016 10:16 AM  [name]  has presented today for cardiac catheterization, with the diagnosis of NSTEMI. The various methods of treatment have been discussed with the patient and family. After consideration of risks, benefits and other options for treatment, the patient has consented to  Procedure(s): Left Heart Cath and Coronary Angiography (N/A) as a surgical intervention .  The patient's history has been reviewed, patient examined, no change in status, stable for surgery.  I have reviewed the patient's chart and labs.  Questions were answered to the patient's satisfaction.    Cath Lab Visit (complete for each Cath Lab visit)  Clinical Evaluation Leading to the Procedure:   ACS: Yes.    Non-ACS: N/A  Mccrae Speciale

## 2016-04-20 NOTE — ED Notes (Signed)
Pt placed on O2 for sates in the 80's

## 2016-04-20 NOTE — H&P (View-Only) (Signed)
 Patient Name: [name] Date of Encounter: 04/20/2016  Primary Cardiologist: [facility]  Hospital Problem List     Active Problems:   NSTEMI (non-ST elevated myocardial infarction) (HCC)     Subjective   Chest pain resolved  Inpatient Medications    Scheduled Meds: . [START ON 04/21/2016] aspirin EC  81 mg Oral Daily  . atorvastatin  40 mg Oral q1800  . diltiazem  300 mg Oral Daily  . ferrous sulfate  325 mg Oral Q breakfast  . furosemide  20 mg Oral Daily  . irbesartan  150 mg Oral Daily  . levothyroxine  75 mcg Oral QAC breakfast  . metFORMIN  500 mg Oral Q breakfast  . potassium chloride  10 mEq Oral Daily   Continuous Infusions: . heparin 750 Units/hr (04/20/16 0819)   PRN Meds: acetaminophen, diazepam, hyoscyamine, meclizine, nitroGLYCERIN, ondansetron (ZOFRAN) IV, polyethylene glycol, traMADol   Vital Signs    Vitals:   04/20/16 0530 04/20/16 0600 04/20/16 0630 04/20/16 0700  BP: 122/59 124/86 (!) 114/53 (!) 114/54  Pulse: (!) 39 80 79 79  Resp: (!) 30 20 (!) 29 (!) 31  SpO2: 90% 90% (!) 87% (!) 87%  Weight:      Height:        Intake/Output Summary (Last 24 hours) at 04/20/16 0836 Last data filed at 04/20/16 0640  Gross per 24 hour  Intake               50 ml  Output                0 ml  Net               50 ml   Filed Weights   04/20/16 0347  Weight: 141 lb (64 kg)    Physical Exam    GEN: Well nourished, well developed, in no acute distress.  HEENT: Grossly normal.  Neck: Supple, no JVD, carotid bruits, or masses. Cardiac: RRR, no murmurs, rubs, or gallops. No clubbing, cyanosis, edema.  Radials/DP/PT 2+ and equal bilaterally.  Respiratory:  Respirations regular and unlabored, clear to auscultation bilaterally. GI: Soft, nontender, nondistended, BS + x 4. MS: no deformity or atrophy. Skin: warm and dry, no rash. Neuro:  Strength and sensation are intact. Psych: AAOx3.  Normal affect.  Labs    CBC  Recent Labs  04/20/16 0349  04/20/16 0408  WBC 11.9*  --   NEUTROABS 8.1*  --   HGB 13.2 13.6  HCT 39.2 40.0  MCV 98.0  --   PLT 131*  --    Basic Metabolic Panel  Recent Labs  04/20/16 0349 04/20/16 0408 04/20/16 0611  NA 136 137 136  K 3.3* 3.5 3.7  CL 102 101 102  CO2 23  --  24  GLUCOSE 236* 237* 198*  BUN 14 17 14  CREATININE 1.22* 1.20* 1.23*  CALCIUM 10.1  --  9.9  MG 1.7  --   --    Liver Function Tests  Recent Labs  04/20/16 0349  AST 33  ALT 13*  ALKPHOS 86  BILITOT 0.5  PROT 6.9  ALBUMIN 3.7   No results for input(s): LIPASE, AMYLASE in the last 72 hours. Cardiac Enzymes  Recent Labs  04/20/16 0611  TROPONINI 5.28*   BNP Invalid input(s): POCBNP D-Dimer No results for input(s): DDIMER in the last 72 hours. Hemoglobin A1C No results for input(s): HGBA1C in the last 72 hours. Fasting Lipid Panel No results for   input(s): CHOL, HDL, LDLCALC, TRIG, CHOLHDL, LDLDIRECT in the last 72 hours. Thyroid Function Tests  Recent Labs  04/20/16 0611  TSH 2.742    Telemetry    NSR, PVCs - Personally Reviewed  ECG    Bigeminy - Personally Reviewed  Radiology    Dg Chest 2 View  Result Date: 04/20/2016 CLINICAL DATA:  Acute onset of generalized chest pain and anxiety. Initial encounter. EXAM: CHEST  2 VIEW COMPARISON:  Chest radiograph performed 11/22/2014 FINDINGS: The lungs are well-aerated. Vascular congestion is noted. Increased interstitial markings raise concern for mild pulmonary edema. Mild bibasilar atelectasis is seen. There is no evidence of pleural effusion or pneumothorax. The heart is mildly enlarged. A pacemaker is noted at the left chest wall, with leads ending at the right atrium and right ventricle. No acute osseous abnormalities are seen. IMPRESSION: Vascular congestion and mild cardiomegaly. Increased interstitial markings raise concern for mild pulmonary edema. Mild bibasilar atelectasis noted. Electronically Signed   By: Roanna RaiderJeffery  Chang M.D.   On:  04/20/2016 04:35    Cardiac Studies     Patient Profile     82 y/o with elevated troponin  Assessment & Plan    We discussed carduac cath.  She is independent.  She drives.  She does not take her coumadin as prescribed.  She reports that she does not like the bruising.  THis could be NSTEMI vs. Takatsubo cardiomyopathy.  She is agreeable to cath.  Would minimize dye exposure.    Consider Synergy stent given that she will need anticoagulation going forward for her AFib.  WOuld like to try to minimize duration of antiplatelet therapy.   Signed, Lance MussJayadeep Raudel Bazen, MD  04/20/2016, 8:36 AM

## 2016-04-20 NOTE — Progress Notes (Signed)
Patient Name: Maria BrunsMaxine F Vanvorst Date of Encounter: 04/20/2016  [facility]  Hospital Problem List     Active Problems:   NSTEMI (non-ST elevated myocardial infarction) [facility](HCC)     Subjective   Chest pain resolved  Inpatient Medications    Scheduled Meds: . [START ON 04/21/2016] aspirin EC  81 mg Oral Daily  . atorvastatin  40 mg Oral q1800  . diltiazem  300 mg Oral Daily  . ferrous sulfate  325 mg Oral Q breakfast  . furosemide  20 mg Oral Daily  . irbesartan  150 mg Oral Daily  . levothyroxine  75 mcg Oral QAC breakfast  . metFORMIN  500 mg Oral Q breakfast  . potassium chloride  10 mEq Oral Daily   Continuous Infusions: . heparin 750 Units/hr (04/20/16 0819)   PRN Meds: acetaminophen, diazepam, hyoscyamine, meclizine, nitroGLYCERIN, ondansetron (ZOFRAN) IV, polyethylene glycol, traMADol   Vital Signs    Vitals:   04/20/16 0530 04/20/16 0600 04/20/16 0630 04/20/16 0700  BP: 122/59 124/86 (!) 114/53 (!) 114/54  Pulse: (!) 39 80 79 79  Resp: (!) 30 20 (!) 29 (!) 31  SpO2: 90% 90% (!) 87% (!) 87%  Weight:      Height:        Intake/Output Summary (Last 24 hours) at 04/20/16 0836 Last data filed at 04/20/16 0640  Gross per 24 hour  Intake               50 ml  Output                0 ml  Net               50 ml   Filed Weights   04/20/16 0347  Weight: 141 lb (64 kg)    Physical Exam    GEN: Well nourished, well developed, in no acute distress.  HEENT: Grossly normal.  Neck: Supple, no JVD, carotid bruits, or masses. Cardiac: RRR, no murmurs, rubs, or gallops. No clubbing, cyanosis, edema.  Radials/DP/PT 2+ and equal bilaterally.  Respiratory:  Respirations regular and unlabored, clear to auscultation bilaterally. GI: Soft, nontender, nondistended, BS + x 4. MS: no deformity or atrophy. Skin: warm and dry, no rash. Neuro:  Strength and sensation are intact. Psych: AAOx3.  Normal affect.  Labs    CBC  Recent Labs  04/20/16 0349  04/20/16 0408  WBC 11.9*  --   NEUTROABS 8.1*  --   HGB 13.2 13.6  HCT 39.2 40.0  MCV 98.0  --   PLT 131*  --    Basic Metabolic Panel  Recent Labs  04/20/16 0349 04/20/16 0408 04/20/16 0611  NA 136 137 136  K 3.3* 3.5 3.7  CL 102 101 102  CO2 23  --  24  GLUCOSE 236* 237* 198*  BUN 14 17 14   CREATININE 1.22* 1.20* 1.23*  CALCIUM 10.1  --  9.9  MG 1.7  --   --    Liver Function Tests  Recent Labs  04/20/16 0349  AST 33  ALT 13*  ALKPHOS 86  BILITOT 0.5  PROT 6.9  ALBUMIN 3.7   No results for input(s): LIPASE, AMYLASE in the last 72 hours. Cardiac Enzymes  Recent Labs  04/20/16 0611  TROPONINI 5.28*   BNP Invalid input(s): POCBNP D-Dimer No results for input(s): DDIMER in the last 72 hours. Hemoglobin A1C No results for input(s): HGBA1C in the last 72 hours. Fasting Lipid Panel No results for  input(s): CHOL, HDL, LDLCALC, TRIG, CHOLHDL, LDLDIRECT in the last 72 hours. Thyroid Function Tests  Recent Labs  04/20/16 0611  TSH 2.742    Telemetry    NSR, PVCs - Personally Reviewed  ECG    Bigeminy - Personally Reviewed  Radiology    Dg Chest 2 View  Result Date: 04/20/2016 CLINICAL DATA:  Acute onset of generalized chest pain and anxiety. Initial encounter. EXAM: CHEST  2 VIEW COMPARISON:  Chest radiograph performed 11/22/2014 FINDINGS: The lungs are well-aerated. Vascular congestion is noted. Increased interstitial markings raise concern for mild pulmonary edema. Mild bibasilar atelectasis is seen. There is no evidence of pleural effusion or pneumothorax. The heart is mildly enlarged. A pacemaker is noted at the left chest wall, with leads ending at the right atrium and right ventricle. No acute osseous abnormalities are seen. IMPRESSION: Vascular congestion and mild cardiomegaly. Increased interstitial markings raise concern for mild pulmonary edema. Mild bibasilar atelectasis noted. Electronically Signed   By: Roanna RaiderJeffery  Chang M.D.   On:  04/20/2016 04:35    Cardiac Studies     Patient Profile     82 y/o with elevated troponin  Assessment & Plan    We discussed carduac cath.  She is independent.  She drives.  She does not take her coumadin as prescribed.  She reports that she does not like the bruising.  THis could be NSTEMI vs. Takatsubo cardiomyopathy.  She is agreeable to cath.  Would minimize dye exposure.    Consider Synergy stent given that she will need anticoagulation going forward for her AFib.  WOuld like to try to minimize duration of antiplatelet therapy.   Signed, Lance MussJayadeep Varanasi, MD  04/20/2016, 8:36 AM

## 2016-04-20 NOTE — Brief Op Note (Signed)
Brief Cardiac Catheterization Note (full report to follow)  04/20/2016  11:09 AM  PATIENT:  [name]  82 y.o. female  PRE-OPERATIVE DIAGNOSIS:  NSTEMI  POST-OPERATIVE DIAGNOSIS:  Multivessel coronary artery disease  PROCEDURE:  Procedure(s): Left Heart Cath and Coronary Angiography (N/A)  SURGEON:  Surgeon(s) and Role:    * Maria Kendallhristopher Tyniah Kastens, MD - Primary  Findings: 1. Severe 2-vessel coronary artery disease, including diffuse proximal and mi LAD disease (up to 80-90% in the midvessel) and thrombotic 90% stenosis of large proximal LCx with involvement of medium-caliber OM1. 2. Moderate diffuse RCA disease. 3. Moderately elevated left ventricular filling pressure (LVEDP 25-30 mmHg).  Recommendations: 1. Transfer to stepdown for medical optimization, including diuresis given hypoxia, orthopnea, and elevated LVEDP. 2. Restart heparin infusion 2 hours after TR band deflation. 3. Cardiac surgery consultation to evaluate for CABG versus PCI.  Anatomy would be challenging due to involvement of large LCx and OM1, but staged PCI to LCx/OM1 and LAD could be performed if patient is not felt to be a reasonable CABG candidate. 4. Transthoracic echocardiogram.  The images were reviewed with Dr. Eldridge DaceVaranasi, who is in agreement with the findings and plan.  Maria Kendallhristopher Theophilus Walz, MD [facility] Pager: [phone]

## 2016-04-20 NOTE — ED Notes (Signed)
Patient transported to X-ray 

## 2016-04-20 NOTE — ED Notes (Signed)
Cardiology at bedside.

## 2016-04-20 NOTE — Progress Notes (Signed)
CRITICAL VALUE ALERT  Critical value received:  Trop 52.69  Date of notification:  04/20/2016  Time of notification:  1534  Critical value read back:Yes.    Nurse who received alert:  Nicolai Labonte, [employer] Corporation[employer]olly Cartner  L. Su Hiltoberts, NP at bedside and made aware.

## 2016-04-20 NOTE — ED Provider Notes (Signed)
[facility] DEPT Provider Note   CSN: [id number] Arrival date & time: 04/20/16  7616  By signing my name below, I, Delton Prairie, attest that this documentation has been prepared under the direction and in the presence of Everlene Balls, MD  Electronically Signed: Delton Prairie, ED Scribe. 04/20/16. 4:06 AM.   History   Chief Complaint Chief Complaint  Patient presents with  . Chest Pain    The history is provided by the patient. No language interpreter was used.   HPI Comments:  [name] is a 82 y.o. female, with a PSH of a pacemaker placement and a PMHx of CHF, HTN, A-Fib, and hyperlipidemia, who presents to the Emergency Department, via EMS, complaining of intermettent pressurized, mid chest pain x which began prior to arrival. Pt notes associated SOB and subjective fevers. She states she has been under a lot of stress lately. She is followed by Dr. Crissie Sickles. Pt denies a cough, any other associated symptoms and modifying factors at this time.    Past Medical History:  Diagnosis Date  . Anxiety   . Atrial fibrillation ([place])   . Atrial fibrillation ([place])   . CHF (congestive heart failure) ([place])   . Constipation   . Diverticulosis of colon   . DM (diabetes mellitus) ([place])   . Hyperlipidemia   . Hypertension   . Hypothyroidism   . Osteoporosis   . Pain in left ear   . Thyroid disease   . Tremor, essential   . Type II or unspecified type diabetes mellitus with neurological manifestations, not stated as uncontrolled(250.60)     Patient Active Problem List   Diagnosis Date Noted  . Encounter for therapeutic drug monitoring 09/04/2013  . Long term current use of anticoagulant therapy 01/06/2013  . Renal insufficiency 09/12/2010  . Dvt femoral (deep venous thrombosis) ([place]) 09/08/2010  . Hearing loss 11/08/2009  . BACK PAIN 09/06/2009  . PACEMAKER, PERMANENT 10/13/2008  . OSTEOPOROSIS 07/05/2007  . Dyslipidemia 05/03/2007  . TREMOR, ESSENTIAL 02/26/2007  .  Congestive heart failure ([place]) 01/26/2007  . Hypothyroidism 01/02/2007  . Diabetes mellitus with neuropathy ([place]) 01/02/2007  . Anxiety state 01/02/2007  . Essential hypertension 01/02/2007  . ATRIAL FIBRILLATION 01/02/2007  . DIVERTICULOSIS, COLON 01/02/2007    Past Surgical History:  Procedure Laterality Date  . CATARACT EXTRACTION  2002  . L3 compression fraction    . PACEMAKER PLACEMENT    . TRANSTHORACIC ECHOCARDIOGRAM  2011, 2012    OB History    No data available       Home Medications    Prior to Admission medications   Medication Sig Start Date End Date Taking? Authorizing Provider  ACCU-CHEK SOFTCLIX LANCETS lancets TEST once daily 07/08/14   Marletta Lor, MD  acetaminophen (TYLENOL) 650 MG CR tablet Take 650 mg by mouth every 8 (eight) hours as needed for pain.    Historical Provider, MD  Blood Glucose Monitoring Suppl (ACCU-CHEK AVIVA PLUS) W/DEVICE KIT Use as directed 07/16/14   Lucretia Kern, DO  Blood Glucose Monitoring Suppl (ACCU-CHEK AVIVA PLUS) w/Device KIT use as directed 12/20/15   Marletta Lor, MD  CARTIA XT 300 MG 24 hr capsule take 1 capsule by mouth once daily 03/09/16   Marletta Lor, MD  diazepam (VALIUM) 5 MG tablet take 1 tablet by mouth twice a day if needed 03/27/16   Marletta Lor, MD  ferrous sulfate 325 (65 FE) MG tablet take 1 tablet by mouth once daily 03/31/16  Marletta Lor, MD  furosemide (LASIX) 20 MG tablet take 1 tablet by mouth once daily 01/04/16   Marletta Lor, MD  glucose blood (ACCU-CHEK AVIVA PLUS) test strip 1 each by Other route daily as needed. 12/20/15   Marletta Lor, MD  hyoscyamine (LEVBID) 0.375 MG 12 hr tablet take 1 tablet by mouth twice a day ONLY IF NEEDED  **DO NOT TAKE EVERYDAY MUST LAST 30 DAYS** 12/20/15   Marletta Lor, MD  levothyroxine (SYNTHROID, LEVOTHROID) 75 MCG tablet take 1 tablet by mouth once daily 03/31/16   Marletta Lor, MD  meclizine (ANTIVERT) 25 MG  tablet Take 1 tablet (25 mg total) by mouth 3 (three) times daily as needed for dizziness. 10/26/15   Laurey Morale, MD  metFORMIN (GLUCOPHAGE-XR) 500 MG 24 hr tablet Take 1 tablet (500 mg total) by mouth daily with breakfast. 11/26/15   Marletta Lor, MD  polyethylene glycol powder (GLYCOLAX/MIRALAX) powder Take 1 Container by mouth daily as needed (constipation).    Historical Provider, MD  potassium chloride (K-DUR,KLOR-CON) 10 MEQ tablet take 1 tablet by mouth once daily 11/19/14   Marletta Lor, MD  sulfamethoxazole-trimethoprim (BACTRIM DS,SEPTRA DS) 800-160 MG tablet Take 1 tablet by mouth 2 (two) times daily. 03/30/16   Marletta Lor, MD  telmisartan (MICARDIS) 80 MG tablet take 1 tablet by mouth once daily 03/31/16   Marletta Lor, MD  traMADol (ULTRAM) 50 MG tablet Take 1 tablet (50 mg total) by mouth every 6 (six) hours as needed for moderate pain. 10/26/15   Laurey Morale, MD  warfarin (COUMADIN) 2 MG tablet Take as directed by anticoagulation clinic 11/24/15   Marletta Lor, MD  warfarin (COUMADIN) 2 MG tablet TAKE AS DIRECTED BY ANTICOAGULATION CLINIC 03/31/16   Marletta Lor, MD    Family History Family History  Problem Relation Age of Onset  . Heart attack Father   . Colon cancer Neg Hx     Social History Social History  Substance Use Topics  . Smoking status: Never Smoker  . Smokeless tobacco: Never Used  . Alcohol use No     Allergies   Codeine sulfate and Simvastatin   Review of Systems Review of Systems  Constitutional: Positive for fever (subjective).  Respiratory: Positive for shortness of breath. Negative for cough.   Cardiovascular: Positive for chest pain.  10 systems reviewed and all are negative for acute change except as noted in the HPI.   Physical Exam Updated Vital Signs BP 123/64   Pulse 70   Resp 15   Ht _0  (1.626 m)   Wt 141 lb (64 kg)   SpO2 91%   BMI 24.20 kg/m   Physical Exam  Constitutional: She is  oriented to person, place, and time. She appears well-developed and well-nourished. No distress.  Pt on nasal cannula.   HENT:  Head: Normocephalic and atraumatic.  Nose: Nose normal.  Mouth/Throat: Oropharynx is clear and moist. No oropharyngeal exudate.  Eyes: Conjunctivae and EOM are normal. Pupils are equal, round, and reactive to light. No scleral icterus.  Neck: Normal range of motion. Neck supple. No JVD present. No tracheal deviation present. No thyromegaly present.  Cardiovascular: Normal rate, regular rhythm and normal heart sounds.  Exam reveals no gallop and no friction rub.   No murmur heard. Pulmonary/Chest: Effort normal. Tachypnea noted. No respiratory distress. She has no wheezes. She exhibits no tenderness.  Tachypnea with increased work of breathing.  Abdominal:  Soft. Bowel sounds are normal. She exhibits no distension and no mass. There is no tenderness. There is no rebound and no guarding.  Musculoskeletal: Normal range of motion. She exhibits no edema or tenderness.  Lymphadenopathy:    She has no cervical adenopathy.  Neurological: She is alert and oriented to person, place, and time. No cranial nerve deficit. She exhibits normal muscle tone.  Resting tremor  Skin: Skin is warm and dry. No rash noted. No erythema. No pallor.  Nursing note and vitals reviewed.    ED Treatments / Results  DIAGNOSTIC STUDIES:  Oxygen Saturation is 87% on [place], low by my interpretation.    COORDINATION OF CARE:  3:51 AM Discussed treatment plan with pt at bedside and pt agreed to plan.  Labs (all labs ordered are listed, but only abnormal results are displayed) Labs Reviewed  CBC WITH DIFFERENTIAL/PLATELET - Abnormal; Notable for the following:       Result Value   WBC 11.9 (*)    Platelets 131 (*)    Neutro Abs 8.1 (*)    All other components within normal limits  COMPREHENSIVE METABOLIC PANEL - Abnormal; Notable for the following:    Potassium 3.3 (*)    Glucose, Bld 236  (*)    Creatinine, Ser 1.22 (*)    ALT 13 (*)    GFR calc non Af Amer 39 (*)    GFR calc Af Amer 45 (*)    All other components within normal limits  I-STAT CHEM 8, ED - Abnormal; Notable for the following:    Creatinine, Ser 1.20 (*)    Glucose, Bld 237 (*)    All other components within normal limits  I-STAT CG4 LACTIC ACID, ED - Abnormal; Notable for the following:    Lactic Acid, Venous 2.92 (*)    All other components within normal limits  I-STAT TROPOININ, ED - Abnormal; Notable for the following:    Troponin i, poc 0.82 (*)    All other components within normal limits  CULTURE, BLOOD (ROUTINE X 2)  CULTURE, BLOOD (ROUTINE X 2)  MAGNESIUM  BRAIN NATRIURETIC PEPTIDE  PROTIME-INR    EKG  EKG Interpretation  Date/Time:  Thursday April 20 2016 03:44:46 EST Ventricular Rate:  89 PR Interval:    QRS Duration: 155 QT Interval:  448 QTC Calculation: 452 R Axis:   -80 Text Interpretation:  Normal sinus rhythm in a pattern of bigeminy not currently v paced which is new since last tracing Confirmed by Glynn Octave [phone]) on 04/20/2016 4:17:23 AM       Radiology Dg Chest 2 View  Result Date: 04/20/2016 CLINICAL DATA:  Acute onset of generalized chest pain and anxiety. Initial encounter. EXAM: CHEST  2 VIEW COMPARISON:  Chest radiograph performed 11/22/2014 FINDINGS: The lungs are well-aerated. Vascular congestion is noted. Increased interstitial markings raise concern for mild pulmonary edema. Mild bibasilar atelectasis is seen. There is no evidence of pleural effusion or pneumothorax. The heart is mildly enlarged. A pacemaker is noted at the left chest wall, with leads ending at the right atrium and right ventricle. No acute osseous abnormalities are seen. IMPRESSION: Vascular congestion and mild cardiomegaly. Increased interstitial markings raise concern for mild pulmonary edema. Mild bibasilar atelectasis noted. Electronically Signed   By: Garald Balding M.D.   On:  04/20/2016 04:35    Procedures Procedures (including critical care time)  Medications Ordered in ED Medications  potassium chloride SA (K-DUR,KLOR-CON) CR tablet 40 mEq (not administered)  magnesium  sulfate IVPB 2 g 50 mL (not administered)  furosemide (LASIX) injection 80 mg (80 mg Intravenous Given 04/20/16 0511)     Initial Impression / Assessment and Plan / ED Course  I have reviewed the triage vital signs and the nursing notes.  Pertinent labs & imaging results that were available during my care of the patient were reviewed by me and considered in my medical decision making (see chart for details).  Clinical Course     Patient presents to the ED for chest pain and SOB.  She has mild hypoxia on exam as well, but is speaking in full sentences and in NAD.  CXR reveals fluid, legs only have mild edema.  Troponin elevated, concerning for NSTEMI, or this may be simply CHF exacerbation.  She was given 36m IV lasix and I consulted with cardiology who will admit for further care.  ELG shows new bigeminy and the prior V paced rhythm is currently not present.  Electrolytes replaced.  Patient given aspirin.  CRITICAL CARE Performed by: OEverlene Balls  Total critical care time: 50 minutes - NSTEMI  Critical care time was exclusive of separately billable procedures and treating other patients.  Critical care was necessary to treat or prevent imminent or life-threatening deterioration.  Critical care was time spent personally by me on the following activities: development of treatment plan with patient and/or surrogate as well as nursing, discussions with consultants, evaluation of patient's response to treatment, examination of patient, obtaining history from patient or surrogate, ordering and performing treatments and interventions, ordering and review of laboratory studies, ordering and review of radiographic studies, pulse oximetry and re-evaluation of patient's condition.   Final  Clinical Impressions(s) / ED Diagnoses   Final diagnoses:  NSTEMI (non-ST elevated myocardial infarction) ([facility]    New Prescriptions New Prescriptions   No medications on file    I personally performed the services described in this documentation, which was scribed in my presence. The recorded information has been reviewed and is accurate.     AEverlene Balls MD 04/20/16 [phone]

## 2016-04-20 NOTE — Progress Notes (Signed)
Entered patient room to check IV pump alarm, pt reaching for call light to call RN. Pt pale, diaphoretic, nauseated, o2 sats dropping.  Pt placed back on NRB mask. NP paged and made aware, EKG done, and  Zofran given. NP to come to bedside and evaluate the patient.

## 2016-04-20 NOTE — Progress Notes (Signed)
Called to the bedside as patient's O2 sats dropped, and began to complain of abd pain. On arrival patient was on NRB sats maintaining in the 90s. Tells me her abd hurts and she's very nauseated. Was given Zofran by nursing staff. Began to vomiting small amount. Diaphoretic and stating she's hot. Lungs with mild basilar crackles, increased work of breathing. RRR, some tenderness with palpitation to the abd. Was given 40mg  IV lasix post cath with 600cc UOP thus far. Blood pressure stable. Will start IV nitro and resume heparin. Ordered KUB for abd pain. Follow up trop elevated to 52.69. EKG showed Vpaced rhythm.Telemetry noted SR with increased PVCs. Check ABG. Called attending to the bedside to determine further treatment plan.   Cath earlier today showed:Severe 2-vessel coronary artery disease, including diffuse proximal and mi LAD disease (up to 80-90% in the midvessel) and thrombotic 90% stenosis of large proximal LCx with involvement of medium-caliber OM1. Moderate diffuse RCA disease. Moderately elevated left ventricular filling pressure (LVEDP 25-30 mmHg).   Laverda PageLindsay Roberts NP-C   I have examined the patient and reviewed assessment and plan and discussed with patient.  Agree with above as stated.  The patient had some minimal emesis after the nausea. Her respirations were labored. Her heart rate was regular. Radial site showed no hematoma. She reported abdominal pain. KUB was ordered.  After about 30 minutes, her breathing improved. The pressure that she had reported in her chest had resolved. She was speaking more complete sentences with less difficulty.  I spoke to TinaAngela, the power of attorney. I confirmed that the patient is a full code.  Of note, there are some difficult family dynamics. We spoke about this at length. There is a granddaughter who lives nearby with a court-ordered restraining order placed against her. This granddaughter apparently has been a source of stress to the  patient.  For now, continue IV nitroglycerin and IV heparin. Await decision regarding revascularization. We'll also need to follow renal function. Based on the Lasix and contrast, there may be some decrease in renal function.  We'll follow closely in the ICU.  Critical care time 60 minutes  Lance MussJayadeep Aralynn Brake

## 2016-04-20 NOTE — H&P (Addendum)
Patient ID: [name] MRN: [id number], DOB/AGE: 1927-02-28   Admit date: 04/20/2016   Primary Physician: Nyoka Cowden, MD  Pt. Profile: 82 yo female persistent afib, s/p AVJ ablation and Pm, HFpEF who presents with chest pain.   She reports on 11/29 moving some bags for [employer] when she developed chest pressure and tightness - that was intermittent for 3-4 hours. She called EMS but then decided to stay home and not be brought to the Ed. Has been under more stress than usual also with her family.   Later in the day, she continued to feel poorly, had some nausea and upset stomach.   Currently chest pain free and no n/v.   Problem List  Past Medical History:  Diagnosis Date  . Anxiety   . Atrial fibrillation ([place])   . Atrial fibrillation ([place])   . CHF (congestive heart failure) ([place])   . Constipation   . Diverticulosis of colon   . DM (diabetes mellitus) ([place])   . Hyperlipidemia   . Hypertension   . Hypothyroidism   . Osteoporosis   . Pain in left ear   . Thyroid disease   . Tremor, essential   . Type II or unspecified type diabetes mellitus with neurological manifestations, not stated as uncontrolled(250.60)     Past Surgical History:  Procedure Laterality Date  . CATARACT EXTRACTION  2002  . L3 compression fraction    . PACEMAKER PLACEMENT    . TRANSTHORACIC ECHOCARDIOGRAM  2011, 2012     Allergies  Allergies  Allergen Reactions  . Codeine Sulfate Shortness Of Breath and Other (See Comments)    Felt funny  . Simvastatin Other (See Comments)     choking, acid reflux   Home Medications  Prior to Admission medications   Medication Sig Start Date End Date Taking? Authorizing Provider  ACCU-CHEK SOFTCLIX LANCETS lancets TEST once daily 07/08/14   Marletta Lor, MD  acetaminophen (TYLENOL) 650 MG CR tablet Take 650 mg by mouth every 8 (eight) hours as needed for pain.    Historical Provider, MD  Blood Glucose Monitoring Suppl  (ACCU-CHEK AVIVA PLUS) W/DEVICE KIT Use as directed 07/16/14   Lucretia Kern, DO  Blood Glucose Monitoring Suppl (ACCU-CHEK AVIVA PLUS) w/Device KIT use as directed 12/20/15   Marletta Lor, MD  CARTIA XT 300 MG 24 hr capsule take 1 capsule by mouth once daily 03/09/16   Marletta Lor, MD  diazepam (VALIUM) 5 MG tablet take 1 tablet by mouth twice a day if needed 03/27/16   Marletta Lor, MD  ferrous sulfate 325 (65 FE) MG tablet take 1 tablet by mouth once daily 03/31/16   Marletta Lor, MD  furosemide (LASIX) 20 MG tablet take 1 tablet by mouth once daily 01/04/16   Marletta Lor, MD  glucose blood (ACCU-CHEK AVIVA PLUS) test strip 1 each by Other route daily as needed. 12/20/15   Marletta Lor, MD  hyoscyamine (LEVBID) 0.375 MG 12 hr tablet take 1 tablet by mouth twice a day ONLY IF NEEDED  **DO NOT TAKE EVERYDAY MUST LAST 30 DAYS** 12/20/15   Marletta Lor, MD  levothyroxine (SYNTHROID, LEVOTHROID) 75 MCG tablet take 1 tablet by mouth once daily 03/31/16   Marletta Lor, MD  meclizine (ANTIVERT) 25 MG tablet Take 1 tablet (25 mg total) by mouth 3 (three) times daily as needed for dizziness. 10/26/15   Laurey Morale, MD  metFORMIN (GLUCOPHAGE-XR) 500 MG 24  hr tablet Take 1 tablet (500 mg total) by mouth daily with breakfast. 11/26/15   Marletta Lor, MD  polyethylene glycol powder (GLYCOLAX/MIRALAX) powder Take 1 Container by mouth daily as needed (constipation).    Historical Provider, MD  potassium chloride (K-DUR,KLOR-CON) 10 MEQ tablet take 1 tablet by mouth once daily 11/19/14   Marletta Lor, MD  sulfamethoxazole-trimethoprim (BACTRIM DS,SEPTRA DS) 800-160 MG tablet Take 1 tablet by mouth 2 (two) times daily. 03/30/16   Marletta Lor, MD  telmisartan (MICARDIS) 80 MG tablet take 1 tablet by mouth once daily 03/31/16   Marletta Lor, MD  traMADol (ULTRAM) 50 MG tablet Take 1 tablet (50 mg total) by mouth every 6 (six) hours as needed  for moderate pain. 10/26/15   Laurey Morale, MD  warfarin (COUMADIN) 2 MG tablet Take as directed by anticoagulation clinic 11/24/15   Marletta Lor, MD  warfarin (COUMADIN) 2 MG tablet TAKE AS DIRECTED BY ANTICOAGULATION CLINIC 03/31/16   Marletta Lor, MD    Family History  Family History  Problem Relation Age of Onset  . Heart attack Father   . Colon cancer Neg Hx     Social History  Social History   Social History  . Marital status: Widowed    Spouse name: N/A  . Number of children: N/A  . Years of education: N/A   Occupational History  . Not on file.   Social History Main Topics  . Smoking status: Never Smoker  . Smokeless tobacco: Never Used  . Alcohol use No  . Drug use: No  . Sexual activity: Not on file   Other Topics Concern  . Not on file   Social History Narrative  . No narrative on file     Review of Systems General:  No chills, fever, night sweats or weight changes.  Cardiovascular:  No chest pain, dyspnea on exertion, edema, orthopnea, palpitations, paroxysmal nocturnal dyspnea. Dermatological: No rash, lesions/masses Respiratory: No cough, dyspnea Urologic: No hematuria, dysuria Abdominal:   No nausea, vomiting, diarrhea, bright red blood per rectum, melena, or hematemesis Neurologic:  No visual changes, wkns, changes in mental status. All other systems reviewed and are otherwise negative except as noted above.  Physical Exam  Blood pressure 119/57, pulse 79, resp. rate (!) 28, height '5\' 4"'$  (1.626 m), weight 64 kg (141 lb), SpO2 91 %.  General: Pleasant, NAD Psych: Normal affect. Neuro: Alert and oriented X 3. Moves all extremities spontaneously. HEENT: Normal  Neck: Supple without bruits or JVD. Lungs:  Crackles at bases Heart: irregular, no s3, s4, or murmurs. Abdomen: Soft, non-tender, non-distended, BS + x 4.  Extremities: trace edema in bilateral ankles  Labs  Troponin (Point of Care Test)  Recent Labs  04/20/16 0404    TROPIPOC 0.82*   No results for input(s): CKTOTAL, CKMB, TROPONINI in the last 72 hours. Lab Results  Component Value Date   WBC 11.9 (H) 04/20/2016   HGB 13.6 04/20/2016   HCT 40.0 04/20/2016   MCV 98.0 04/20/2016   PLT 131 (L) 04/20/2016    Recent Labs Lab 04/20/16 0349 04/20/16 0408  NA 136 137  K 3.3* 3.5  CL 102 101  CO2 23  --   BUN 14 17  CREATININE 1.22* 1.20*  CALCIUM 10.1  --   PROT 6.9  --   BILITOT 0.5  --   ALKPHOS 86  --   ALT 13*  --   AST 33  --  GLUCOSE 236* 237*   Lab Results  Component Value Date   CHOL 187 07/01/2015   HDL 34.40 (L) 07/01/2015   LDLCALC 115 (H) 07/01/2015   TRIG 189.0 (H) 07/01/2015   No results found for: DDIMER   Radiology/Studies  Dg Chest 2 View  Result Date: 04/20/2016 CLINICAL DATA:  Acute onset of generalized chest pain and anxiety. Initial encounter. EXAM: CHEST  2 VIEW COMPARISON:  Chest radiograph performed 11/22/2014 FINDINGS: The lungs are well-aerated. Vascular congestion is noted. Increased interstitial markings raise concern for mild pulmonary edema. Mild bibasilar atelectasis is seen. There is no evidence of pleural effusion or pneumothorax. The heart is mildly enlarged. A pacemaker is noted at the left chest wall, with leads ending at the right atrium and right ventricle. No acute osseous abnormalities are seen. IMPRESSION: Vascular congestion and mild cardiomegaly. Increased interstitial markings raise concern for mild pulmonary edema. Mild bibasilar atelectasis noted. Electronically Signed   By: Garald Balding M.D.   On: 04/20/2016 04:35    ECG V paced  Echocardiogram  2012 - Left ventricle: The cavity size was normal. Wall thickness was normal. Systolic function was normal. The estimated ejection fraction was in the range of 55% to 60%. - Pulmonary arteries: PA peak pressure: 57m Hg (S). Transthoracic echocardiography. M-mode, complete 2D, spectral Doppler, and color Doppler. Height: Height:  165cm. Height: 65in. Weight: Weight: 77.2kg. Weight: 169.8lb. Body mass index: BMI: 28.4kg/m^2. Body surface area: BSA: 1.952m. Blood pressure: 115/62. Patient status: Inpatient. Location: ICU/CCU    ASSESSMENT AND PLAN [age] female persistent afib, s/p AVJ ablation and Pm, HFpEF who presents with chest pain, with troponin elevation.  # NSTEMI: no known CAD history, but has been exerting herself and under more stress with her grand-daughters. Possibly takotsubo.   - will give heparin gtt - asa/statin - repeat trops and ekg - currently pain free - repeat TTE  # HTN - ARB/CCB  # HFpEF: near euvolemic on exam  # Afib - warfarin - continue her Dilt  # DMII - metformin  # Hypothyroidism - levothyroixine  FULL CODE  Signed, JeCharlies SilversMD

## 2016-04-20 NOTE — Progress Notes (Addendum)
Difficult family dynamics with patient. AC involved with family and determining the contact person for nursing. Awaiting email with paperwork from POA.

## 2016-04-20 NOTE — Progress Notes (Signed)
ANTICOAGULATION CONSULT NOTE - Initial Consult  Pharmacy Consult for Heparin Indication: NSTEMI  Allergies  Allergen Reactions  . Codeine Sulfate Shortness Of Breath and Other (See Comments)    Felt funny  . Simvastatin Other (See Comments)     choking, acid reflux    Patient Measurements: Height: 5\' 4"  (162.6 cm) Weight: 141 lb (64 kg) IBW/kg (Calculated) : 54.7 Heparin Dosing Weight: 64 kg  Vital Signs: BP: 114/53 (11/30 0630) Pulse Rate: 79 (11/30 0630)  Labs:  Recent Labs  04/20/16 0349 04/20/16 0408 04/20/16 0611  HGB 13.2 13.6  --   HCT 39.2 40.0  --   PLT 131*  --   --   LABPROT  --   --  14.2  INR  --   --  1.09  CREATININE 1.22* 1.20*  --     Estimated Creatinine Clearance: 28.5 mL/min (by C-G formula based on SCr of 1.2 mg/dL (H)).   Medical History: Past Medical History:  Diagnosis Date  . Anxiety   . Atrial fibrillation (HCC)   . Atrial fibrillation (HCC)   . CHF (congestive heart failure) (HCC)   . Constipation   . Diverticulosis of colon   . DM (diabetes mellitus) (HCC)   . Hyperlipidemia   . Hypertension   . Hypothyroidism   . Osteoporosis   . Pain in left ear   . Thyroid disease   . Tremor, essential   . Type II or unspecified type diabetes mellitus with neurological manifestations, not stated as uncontrolled(250.60)     Medications:  See electronic med rec  Assessment: 82 y.o. F presents with CP. Found to have NSTEMI. Noted pt on coumadin PTA but she is unsure if she was taking or not or when her last dose was. Admission INR 1.09 (subtherapeutic). CBC stable on admission.  Goal of Therapy:  Heparin level 0.3-0.7 units/ml Monitor platelets by anticoagulation protocol: Yes   Plan:  Heparin IV bolus 3500 units Heparin gtt at 750 units/hr Will f/u heparin level in 8 hours Daily heparin level and CBC  Christoper Fabianaron Uliana Brinker, PharmD, BCPS Clinical pharmacist, pager [phone] 04/20/2016,7:11 AM

## 2016-04-20 NOTE — ED Notes (Signed)
Attempted to contact family. Speaking with daughter at present. Granddaughter on the way to hospital. Pt remains awake and alert

## 2016-04-20 NOTE — ED Triage Notes (Signed)
[facility] for chest pain and mild shortness of breath. Pt states has been under a lot of stress lately and think that has a lot to do with it.

## 2016-04-21 ENCOUNTER — Inpatient Hospital Stay (HOSPITAL_COMMUNITY): Payer: Medicare Other

## 2016-04-21 DIAGNOSIS — I2510 Atherosclerotic heart disease of native coronary artery without angina pectoris: Secondary | ICD-10-CM

## 2016-04-21 DIAGNOSIS — I213 ST elevation (STEMI) myocardial infarction of unspecified site: Secondary | ICD-10-CM

## 2016-04-21 DIAGNOSIS — J9601 Acute respiratory failure with hypoxia: Secondary | ICD-10-CM

## 2016-04-21 DIAGNOSIS — I214 Non-ST elevation (NSTEMI) myocardial infarction: Secondary | ICD-10-CM

## 2016-04-21 LAB — BASIC METABOLIC PANEL
ANION GAP: 9 (ref 5–15)
Anion gap: 10 (ref 5–15)
BUN: 20 mg/dL (ref 6–20)
BUN: 24 mg/dL — AB (ref 6–20)
CALCIUM: 8.9 mg/dL (ref 8.9–10.3)
CHLORIDE: 101 mmol/L (ref 101–111)
CO2: 26 mmol/L (ref 22–32)
CO2: 27 mmol/L (ref 22–32)
CREATININE: 1.39 mg/dL — AB (ref 0.44–1.00)
Calcium: 9.2 mg/dL (ref 8.9–10.3)
Chloride: 100 mmol/L — ABNORMAL LOW (ref 101–111)
Creatinine, Ser: 1.35 mg/dL — ABNORMAL HIGH (ref 0.44–1.00)
GFR calc Af Amer: 38 mL/min — ABNORMAL LOW (ref >60)
GFR calc non Af Amer: 33 mL/min — ABNORMAL LOW (ref >60)
GFR, EST AFRICAN AMERICAN: 40 mL/min — AB (ref >60)
GFR, EST NON AFRICAN AMERICAN: 34 mL/min — AB (ref >60)
GLUCOSE: 122 mg/dL — AB (ref 65–99)
GLUCOSE: 177 mg/dL — AB (ref 65–99)
Potassium: 3.8 mmol/L (ref 3.5–5.1)
Potassium: 3.8 mmol/L (ref 3.5–5.1)
SODIUM: 138 mmol/L (ref 135–145)
Sodium: 135 mmol/L (ref 135–145)

## 2016-04-21 LAB — CBC
HCT: 36.5 % (ref 36.0–46.0)
Hemoglobin: 12 g/dL (ref 12.0–15.0)
MCH: 32.3 pg (ref 26.0–34.0)
MCHC: 32.9 g/dL (ref 30.0–36.0)
MCV: 98.4 fL (ref 78.0–100.0)
Platelets: 116 10*3/uL — ABNORMAL LOW (ref 150–400)
RBC: 3.71 MIL/uL — ABNORMAL LOW (ref 3.87–5.11)
RDW: 13.9 % (ref 11.5–15.5)
WBC: 11 10*3/uL — ABNORMAL HIGH (ref 4.0–10.5)

## 2016-04-21 LAB — LIPID PANEL
CHOL/HDL RATIO: 4.8 ratio
Cholesterol: 176 mg/dL (ref 0–200)
HDL: 37 mg/dL — AB (ref >40)
LDL CALC: 121 mg/dL — AB (ref 0–99)
Triglycerides: 91 mg/dL (ref <150)
VLDL: 18 mg/dL (ref 0–40)

## 2016-04-21 LAB — GLUCOSE, CAPILLARY
Glucose-Capillary: 157 mg/dL — ABNORMAL HIGH (ref 65–99)
Glucose-Capillary: 100 mg/dL — ABNORMAL HIGH (ref 65–99)

## 2016-04-21 LAB — TROPONIN I: TROPONIN I: 23.46 ng/mL — AB (ref <0.03)

## 2016-04-21 LAB — HEPARIN LEVEL (UNFRACTIONATED)
HEPARIN UNFRACTIONATED: 0.23 [IU]/mL — AB (ref 0.30–0.70)
Heparin Unfractionated: 0.3 IU/mL (ref 0.30–0.70)

## 2016-04-21 NOTE — Consult Note (Signed)
[address]       [place] [id number]             [phone]          CARDIOTHORACIC SURGERY CONSULTATION REPORT  PCP is Rogelia BogaKWIATKOWSKI,PETER FRANK, MD Referring Provider is END, CHRISTOPHER, MD   Reason for consultation:  Severe multivessel CAD s/p acute non-STEMI  HPI:  Patient is an 82 year old female with no previous history of coronary artery disease but history notable for hypertension, type 2 diabetes mellitus, chronic diastolic congestive heart failure, chronic persistent atrial fibrillation status post AV node ablation and her minute pacemaker insertion, hypothyroidism, osteoporosis, and chronic tremor who was admitted to the hospital with acute non-ST segment elevation myocardial infarction complicated by acute hypoxemic respiratory failure who has been referred for surgical consultation to discuss treatment options for management of multivessel coronary artery disease. Patient states that she was in her usual state of health until the afternoon of November 29 when she developed sudden onset substernal chest pressure associated with shortness of breath.  She called EMS and was advised to go to the emergency department, but she decided to stay home. Chest pain persisted overnight and she ultimately called EMS again at 2 in the morning on November 30.  On arrival in the emergency department she was pain-free but she subsequently ruled in for acute non-ST segment elevation myocardial infarction with highly elevated troponin levels, peak troponin I reaching 52.7. The patient developed acute hypoxemic respiratory failure requiring 100% oxygen by facemask. She improved with medical therapy. She underwent diagnostic cardiac catheterization and was found to have severe multivessel coronary artery disease. Left ventricular function was not assessed. Cardiothoracic surgical consultation was requested.  The patient is widowed and [place]. She has remained  functionally independent. She is accompanied by her son-in-law who is at the bedside for consultation today. She is quite frail and elderly, but up until recently has still been able to tend to most of her basic daily needs.  Her ambulation is somewhat limited and she has problems with chronic back pain. She has a chronic essential tremor. She still drives an automobile, although she complains that her family has been trying to convince her to stop. She denies any symptoms of chest pain or chest tightness prior to that of her acute illness.  She complains that she has been losing weight and that she has become more weak. She blames this on her diabetes medications.  Past Medical History:  Diagnosis Date  . Anxiety   . Atrial fibrillation (HCC)   . Atrial fibrillation (HCC)   . CHF (congestive heart failure) (HCC)   . Constipation   . Diverticulosis of colon   . DM (diabetes mellitus) (HCC)   . Hyperlipidemia   . Hypertension   . Hypothyroidism   . Osteoporosis   . Pain in left ear   . Thyroid disease   . Tremor, essential   . Type II or unspecified type diabetes mellitus with neurological manifestations, not stated as uncontrolled(250.60)     Past Surgical History:  Procedure Laterality Date  . CARDIAC CATHETERIZATION N/A 04/20/2016   Procedure: Left Heart Cath and Coronary Angiography;  Surgeon: Yvonne Kendallhristopher End, MD;  [facility];  Service: Cardiovascular;  Laterality: N/A;  . CATARACT EXTRACTION  2002  . L3 compression fraction    . PACEMAKER PLACEMENT    . TRANSTHORACIC ECHOCARDIOGRAM  2011, 2012    Family History  Problem  Relation Age of Onset  . Heart attack Father   . Colon cancer Neg Hx     Social History   Social History  . Marital status: Widowed    Spouse name: N/A  . Number of children: N/A  . Years of education: N/A   Occupational History  . Not on file.   Social History Main Topics  . Smoking status: Never Smoker  . Smokeless tobacco:  Never Used  . Alcohol use No  . Drug use: No  . Sexual activity: Not on file   Other Topics Concern  . Not on file   Social History Narrative  . No narrative on file    Prior to Admission medications   Medication Sig Start Date End Date Taking? Authorizing Provider  CARTIA XT 300 MG 24 hr capsule take 1 capsule by mouth once daily 03/09/16  Yes Gordy Savers, MD  diazepam (VALIUM) 5 MG tablet take 1 tablet by mouth twice a day if needed 03/27/16  Yes Gordy Savers, MD  ferrous sulfate 325 (65 FE) MG tablet take 1 tablet by mouth once daily 03/31/16  Yes Gordy Savers, MD  furosemide (LASIX) 20 MG tablet take 1 tablet by mouth once daily 01/04/16  Yes Gordy Savers, MD  hyoscyamine (LEVBID) 0.375 MG 12 hr tablet take 1 tablet by mouth twice a day ONLY IF NEEDED  **DO NOT TAKE EVERYDAY MUST LAST 30 DAYS** 12/20/15  Yes Gordy Savers, MD  levothyroxine (SYNTHROID, LEVOTHROID) 75 MCG tablet take 1 tablet by mouth once daily 03/31/16  Yes Gordy Savers, MD  metFORMIN (GLUCOPHAGE-XR) 500 MG 24 hr tablet Take 1 tablet (500 mg total) by mouth daily with breakfast. 11/26/15  Yes Gordy Savers, MD  polyethylene glycol powder (GLYCOLAX/MIRALAX) powder Take 1 Container by mouth daily as needed (constipation).   Yes Historical Provider, MD  telmisartan (MICARDIS) 80 MG tablet take 1 tablet by mouth once daily 03/31/16  Yes Gordy Savers, MD  warfarin (COUMADIN) 2 MG tablet Take as directed by anticoagulation clinic 11/24/15  Yes Gordy Savers, MD  meclizine (ANTIVERT) 25 MG tablet Take 1 tablet (25 mg total) by mouth 3 (three) times daily as needed for dizziness. Patient not taking: Reported on 04/20/2016 10/26/15   Nelwyn Salisbury, MD  potassium chloride (K-DUR,KLOR-CON) 10 MEQ tablet take 1 tablet by mouth once daily Patient not taking: Reported on 04/20/2016 11/19/14   Gordy Savers, MD  traMADol (ULTRAM) 50 MG tablet Take 1 tablet (50 mg total) by  mouth every 6 (six) hours as needed for moderate pain. Patient not taking: Reported on 04/20/2016 10/26/15   Nelwyn Salisbury, MD    Current Facility-Administered Medications  Medication Dose Route Frequency Provider Last Rate Last Dose  . acetaminophen (TYLENOL) tablet 650 mg  650 mg Oral Q8H PRN Wilma Flavin, MD      . aspirin EC tablet 81 mg  81 mg Oral Daily Wilma Flavin, MD   81 mg at 04/21/16 1000  . atorvastatin (LIPITOR) tablet 40 mg  40 mg Oral q1800 Wilma Flavin, MD   40 mg at 04/20/16 1718  . ferrous sulfate tablet 325 mg  325 mg Oral Q breakfast Wilma Flavin, MD   325 mg at 04/21/16 1001  . furosemide (LASIX) injection 40 mg  40 mg Intravenous BID Yvonne Kendall, MD   40 mg at 04/21/16 1000  . heparin ADULT infusion 100 units/mL (25000 units/257mL sodium  chloride 0.45%)  900 Units/hr Intravenous Continuous Titus Mould, RPH 9 mL/hr at 04/21/16 0148 900 Units/hr at 04/21/16 0148  . hyoscyamine (LEVBID) 0.375 MG 12 hr tablet 0.375 mg  0.375 mg Oral Q12H PRN Wilma Flavin, MD      . insulin aspart (novoLOG) injection 0-9 Units  0-9 Units Subcutaneous TID WC Yvonne Kendall, MD   2 Units at 04/21/16 0959  . irbesartan (AVAPRO) tablet 150 mg  150 mg Oral Daily Wilma Flavin, MD   150 mg at 04/21/16 1000  . levothyroxine (SYNTHROID, LEVOTHROID) tablet 75 mcg  75 mcg Oral QAC breakfast Wilma Flavin, MD   75 mcg at 04/21/16 1001  . meclizine (ANTIVERT) tablet 25 mg  25 mg Oral TID PRN Wilma Flavin, MD      . MEDLINE mouth rinse  15 mL Mouth Rinse BID Corky Crafts, MD      . nitroGLYCERIN (NITROSTAT) SL tablet 0.4 mg  0.4 mg Sublingual Q5 Min x 3 PRN Wilma Flavin, MD      . nitroGLYCERIN 50 mg in dextrose 5 % 250 mL (0.2 mg/mL) infusion  0-200 mcg/min Intravenous Titrated Arty Baumgartner, NP 3 mL/hr at 04/21/16 0500 10 mcg/min at 04/21/16 0500  . ondansetron (ZOFRAN) injection 4 mg  4 mg Intravenous Q6H PRN Wilma Flavin, MD   4 mg at 04/20/16 1507  . polyethylene  glycol (MIRALAX / GLYCOLAX) packet 17 g  17 g Oral Daily PRN Wilma Flavin, MD      . potassium chloride (K-DUR,KLOR-CON) CR tablet 10 mEq  10 mEq Oral Daily Wilma Flavin, MD   10 mEq at 04/21/16 1000  . traMADol (ULTRAM) tablet 50 mg  50 mg Oral Q6H PRN Wilma Flavin, MD        Allergies  Allergen Reactions  . Codeine Sulfate Shortness Of Breath and Other (See Comments)    Felt funny  . Simvastatin Other (See Comments)     choking, acid reflux      Review of Systems:   General:  decreased appetite, decreased energy, no weight gain, + weight loss, no fever  Cardiac:  + chest pain with exertion, + chest pain at rest, + SOB with exertion, + resting SOB, no PND, no orthopnea, no palpitations, + arrhythmia, + atrial fibrillation, + LE edema, no dizzy spells, no syncope  Respiratory:  + shortness of breath, no home oxygen, no productive cough, no dry cough, no bronchitis, no wheezing, no hemoptysis, no asthma, no pain with inspiration or cough, no sleep apnea, no CPAP at night  GI:   no difficulty swallowing, no reflux, no frequent heartburn, no hiatal hernia, no abdominal pain, no constipation, no diarrhea, no hematochezia, no hematemesis, no melena  GU:   no dysuria,  no frequency, no urinary tract infection, no hematuria, no kidney stones, no kidney disease  Vascular:  no pain suggestive of claudication, no pain in feet, no leg cramps, + varicose veins, no DVT, no non-healing foot ulcer  Neuro:   no stroke, no TIA's, no seizures, n headaches, no temporary blindness one eye,  no slurred speech, + peripheral neuropathy, + chronic pain, mild instability of gait, denies memory/cognitive dysfunction  Musculoskeletal: + arthritis - primarily involving the lower back, + joint swelling, no myalgias, minor difficulty walking, somewhat decreased mobility   Skin:   no rash, no itching, no skin infections, no pressure sores or ulcerations  Psych:   no anxiety, no depression,  no nervousness, + unusual  recent stress  Eyes:   + blurry vision, no floaters, no recent vision changes, + wears glasses or contacts  ENT:   + hearing loss, no loose or painful teeth  Hematologic:  + easy bruising, no abnormal bleeding, no clotting disorder, no frequent epistaxis  Endocrine:  + diabetes, does not check CBG's at home     Physical Exam:   BP (!) 107/58   Pulse 70   Temp 99.3 F (37.4 C) (Axillary)   Resp (!) 28   Ht 5\' 4"  (1.626 m)   Wt 141 lb (64 kg)   SpO2 98%   BMI 24.20 kg/m   General:  Elderly and very frail-appearing  HEENT:  Unremarkable   Neck:   no JVD, no bruits, no adenopathy   Chest:   clear to auscultation, symmetrical breath sounds, no wheezes, no rhonchi   CV:   RRR, no  murmur   Abdomen:  soft, non-tender, no masses   Extremities:  warm, well-perfused, pulses not palpable, trace lower extremity edema  Rectal/GU  Deferred  Neuro:   Grossly non-focal and symmetrical throughout  Skin:   Clean and dry, no rashes, no breakdown  Diagnostic Tests:  Left Heart Cath and Coronary Angiography  Conclusion   Conclusions: 1. Severe 2-vessel coronary artery disease, including diffuse proximal and mi LAD disease (up to 80% in the midvessel) and thrombotic 90% stenosis of large proximal LCx with involvement of medium-caliber OM1 (90%). 2. Moderate diffuse proximal and mid RCA disease. 3. Moderately elevated left ventricular filling pressure (LVEDP 25-30 mmHg).  Recommendations: 1. Transfer to stepdown for medical optimization, including diuresis given hypoxia, orthopnea, and elevated LVEDP. 2. Restart heparin infusion 2 hours after TR band deflation. 3. Cardiac surgery consultation to evaluate for CABG versus PCI. Anatomy would be challenging due to involvement of large LCx and OM1, but staged PCI to LCx/OM1 and LAD could be performed if patient is not felt to be a reasonable CABG candidate. 4. Transthoracic echocardiogram to evaluate LV function.  The images were reviewed with  Dr. Eldridge Dace, who is in agreement with the findings and plan.  Yvonne Kendall, MD [facility] Pager: [phone]  Indications   NSTEMI (non-ST elevated myocardial infarction) (HCC) [I21.4 (ICD-10-CM)]  Procedural Details/Technique   Technical Details Indication: 82 y.o. year-old woman with history of persistent atrial fibrillation s/p pacemaker, type 2 diabetes mellitus, and HFpEF, who was admitted after developing chest pain yesterday. This had been preceded by several days of progressive dyspnea. On arrival here, she is noted to have an elevated troponin consistent with NSTEMI. Though her chest pain has resolved, Ms. Alan has continued to have dyspnea and hypoxia, prompting referral for urgent left heart catheterization and possible PCI.  GFR: 38 ml/min  Procedure: The risks, benefits, complications, treatment options, and expected outcomes were discussed with the patient. The patient and/or family concurred with the proposed plan, giving informed consent. The patient was brought to the cath lab after IV hydration was begun and oral premedication was given. The patient was further sedated with Versed and Fentanyl. The right wrist was assessed with a modified Allens test which was normal. The right wrist was prepped and draped in a sterile fashion. 1% lidocaine was used for local anesthesia. Using the modified Seldinger access technique, a 6 French Terumo slender Glidesheath was placed in the right radial artery. 3 mg Verapamil was given through the sheath. Heparin 3,000 units were administered.  Selective coronary angiography was performed using  21F JL3.5 and JR4 catheters to engage the left and right coronary arteries, respectively. Left heart catheterization was performed using a 21F pigtail catheter. Left ventriculogram was not performed due to dyspnea, elevated LVEDP, and renal insufficiency.  At the end of the procedure, the radial artery sheath was removed and a TR band applied to  achieve patent hemostasis. There were no immediate complications. The patient was taken to the recovery area in stable condition.  Contrast used: 30 mL Isovue Fluoroscopy time: 3.2 min Radiation dose: 218 mGy   Estimated blood loss <50 mL.  During this procedure the patient was administered the following to achieve and maintain moderate conscious sedation: Fentanyl 12.5 mcg, while the patient's heart rate, blood pressure, and oxygen saturation were continuously monitored. The period of conscious sedation was 19 minutes, of which I was present face-to-face 100% of this time.    Complications   Complications documented before study signed (04/20/2016 11:44 AM EST)    No complications were associated with this study.  Documented by Yvonne Kendall, MD - 04/20/2016 11:44 AM EST    Coronary Findings   Dominance: Right  Left Main  Vessel is large. Vessel is angiographically normal.  Left Anterior Descending  Vessel is large.  Ost LAD to Mid LAD lesion, 40% stenosed. The lesion is irregular.  Mid LAD lesion, 80% stenosed.  First Diagonal Branch  Vessel is small in size.  Second Diagonal Branch  Vessel is moderate in size.  2nd Diag lesion, 40% stenosed.  Third Diagonal Branch  Vessel is moderate in size.  3rd Diag lesion, 50% stenosed.  Left Circumflex  Vessel is large.  Prox Cx lesion, 90% stenosed. The lesion is thrombotic.  Dist Cx lesion, 20% stenosed.  First Obtuse Marginal Branch  Vessel is moderate in size.  Ost 1st Mrg to 1st Mrg lesion, 90% stenosed.  Second Obtuse Marginal Branch  Vessel is small in size.  Third Obtuse Marginal Branch  Vessel is large in size.  Right Coronary Artery  Vessel is large.  Prox RCA lesion, 30% stenosed.  Mid RCA lesion, 50% stenosed.  Right Posterior Descending Artery  Vessel is moderate in size.  Right Posterior Atrioventricular Branch  Vessel is moderate in size.  Left Heart   Left Ventricle LV end diastolic pressure is  moderately elevated.    Coronary Diagrams   Diagnostic Diagram     Implants     No implant documentation for this case.  PACS Images   Show images for Cardiac catheterization   Link to Procedure Log   Procedure Log    Hemo Data   Flowsheet Row Most Recent Value  AO Systolic Pressure 118 mmHg  AO Diastolic Pressure 57 mmHg  AO Mean 80 mmHg  LV Systolic Pressure 116 mmHg  LV Diastolic Pressure 15 mmHg  LV EDP 25 mmHg  Arterial Occlusion Pressure Extended Systolic Pressure 115 mmHg  Arterial Occlusion Pressure Extended Diastolic Pressure 54 mmHg  Arterial Occlusion Pressure Extended Mean Pressure 78 mmHg  Left Ventricular Apex Extended Systolic Pressure 116 mmHg  Left Ventricular Apex Extended Diastolic Pressure 15 mmHg  Left Ventricular Apex Extended EDP Pressure 25 mmHg      Impression:  Patient is a very frail, elderly, widowed female with multiple medical problems who presents with an acute myocardial infarction, complicated by acute hypoxemic respiratory failure.  I have personally reviewed the patient's diagnostic cardiac catheterization which reveals severe multivessel coronary artery disease including thrombotic 90% stenosis of the large proximal left circumflex coronary  artery involving bifurcation with an obtuse marginal branch and less severe but nonetheless significant 70-80% diffuse stenosis in the mid left anterior descending coronary artery. Although the patient's coronary anatomy would probably best be treated with surgical revascularization, I would not consider this frail elderly patient candidate for coronary artery bypass grafting.   Plan:  I discussed the nature of the patient's acute presentation, findings of her catheterization, and treatment options at length with the patient and her son-in-law at the bedside. We discussed alternative treatment strategies including long-term palliative medical therapy, high risk PCI and stenting, and coronary artery  bypass grafting at length. The patient would not wish to undergo coronary artery bypass surgery. I do not feel she should be inserted candidate. Risks associated with PCI and stenting may be somewhat elevated, and the patient would not be considered a candidate for emergency surgical revascularization as a bail out. The patient understands. Her biggest voiced concern is that she do everything possible to avoid discharged to a skilled nursing facility.  She clearly understands that given her advanced age and multiple medical problems her longevity is unclear. Her biggest concern is maintaining her quality of life. Please call if we can be of further assistance.   I spent in excess of 90 minutes during the conduct of this hospital consultation and >50% of this time involved direct face-to-face encounter for counseling and/or coordination of the patient's care.    Salvatore Decentlarence H. Cornelius Moraswen, MD 04/21/2016 11:04 AM

## 2016-04-21 NOTE — Progress Notes (Signed)
   Patient is improving.  No angina.  She is more alert.  Breathing is better.  She is not a candidate for CABG.  Continue with diuresis.  Her oxygen requirement is now down to 55%.  Ideally, if her heart failure can be treated more optimally, she would do better with PCI.  This PCI has been scheduled for Monday with Dr. Tresa EndoKelly.  Will continue IV heparin and IV NTG over the weekend to hopefully reduce thrombus burden in the circumflex and help with pulmonary edema.   I will speak to the grand daughter Marylene Landngela, who is the power of attorney.  There are family issues including an active restraining order on one of the family members.   Corky CraftsJayadeep S Vernal Hritz, MD

## 2016-04-21 NOTE — Progress Notes (Signed)
  Echocardiogram 2D Echocardiogram has been performed.  Delcie RochENNINGTON, Leyton Brownlee 04/21/2016, 1:30 PM

## 2016-04-21 NOTE — Progress Notes (Signed)
Patient Name: [name] Date of Encounter: 04/21/2016  Primary Cardiologist: Dr. Renee [facility]aylor  Hospital Problem List     Active Problems:   NSTEMI (non-ST elevated myocardial infarction) [facility](HCC)   Abdominal pain     Subjective   Feels better.  No Cp.  Unable to wean from 100%NRB.  Wants to eat something.    Inpatient Medications    Scheduled Meds: . aspirin EC  81 mg Oral Daily  . atorvastatin  40 mg Oral q1800  . ferrous sulfate  325 mg Oral Q breakfast  . furosemide  40 mg Intravenous BID  . insulin aspart  0-9 Units Subcutaneous TID WC  . irbesartan  150 mg Oral Daily  . levothyroxine  75 mcg Oral QAC breakfast  . mouth rinse  15 mL Mouth Rinse BID  . potassium chloride  10 mEq Oral Daily   Continuous Infusions: . heparin 900 Units/hr (04/21/16 0148)  . nitroGLYCERIN 10 mcg/min (04/21/16 0500)   PRN Meds: acetaminophen, hyoscyamine, meclizine, nitroGLYCERIN, ondansetron (ZOFRAN) IV, polyethylene glycol, traMADol   Vital Signs    Vitals:   04/21/16 0500 04/21/16 0600 04/21/16 0700 04/21/16 0740  BP: 107/60 117/60 (!) 107/58   Pulse: 70 70 70   Resp: (!) 29 (!) 28 (!) 28   Temp:    99.3 F (37.4 C)  TempSrc:    Axillary  SpO2: 97% 96% 98%   Weight:      Height:        Intake/Output Summary (Last 24 hours) at 04/21/16 1051 Last data filed at 04/21/16 0700  Gross per 24 hour  Intake           384.23 ml  Output             1451 ml  Net         -1066.77 ml   Filed Weights   04/20/16 0347  Weight: 141 lb (64 kg)    Physical Exam   GEN: Frail HEENT: Grossly normal.  Neck: Supple, no JVD, carotid bruits, or masses. Cardiac: RRR, right radial site bruised, 2+ radial pulse Respiratory:  Coarse breath sounds bilaterally bilaterally. GI: Soft, nontender, nondistended, BS + x 4. MS: no deformity or atrophy. Skin: warm and dry, no rash. Neuro:  Strength and sensation are intact. Psych: AAOx3.  Normal affect.  Labs    CBC  Recent Labs   04/20/16 0349 04/20/16 0408 04/21/16 0019  WBC 11.9*  --  11.0*  NEUTROABS 8.1*  --   --   HGB 13.2 13.6 12.0  HCT 39.2 40.0 36.5  MCV 98.0  --  98.4  PLT 131*  --  116*   Basic Metabolic Panel  Recent Labs  04/20/16 0349  04/20/16 1823 04/21/16 0019  NA 136  < > 135 135  K 3.3*  < > 3.6 3.8  CL 102  < > 99* 100*  CO2 23  < > 24 26  GLUCOSE 236*  < > 187* 177*  BUN 14  < > 17 20  CREATININE 1.22*  < > 1.34* 1.35*  CALCIUM 10.1  < > 9.5 8.9  MG 1.7  --   --   --   < > = values in this interval not displayed. Liver Function Tests  Recent Labs  04/20/16 0349  AST 33  ALT 13*  ALKPHOS 86  BILITOT 0.5  PROT 6.9  ALBUMIN 3.7   No results for input(s): LIPASE, AMYLASE in the last 72 hours. Cardiac  Enzymes  Recent Labs  04/20/16 0611 04/20/16 1430 04/20/16 1823  TROPONINI 5.28* 52.69* 42.28*   BNP Invalid input(s): POCBNP D-Dimer No results for input(s): DDIMER in the last 72 hours. Hemoglobin A1C No results for input(s): HGBA1C in the last 72 hours. Fasting Lipid Panel  Recent Labs  04/21/16 0019  CHOL 176  HDL 37*  LDLCALC 121*  TRIG 91  CHOLHDL 4.8   Thyroid Function Tests  Recent Labs  04/20/16 0611  TSH 2.742    Telemetry    paced - Personally Reviewed  ECG    paced - Personally Reviewed  Radiology    Dg Chest 2 View  Result Date: 04/20/2016 CLINICAL DATA:  Acute onset of generalized chest pain and anxiety. Initial encounter. EXAM: CHEST  2 VIEW COMPARISON:  Chest radiograph performed 11/22/2014 FINDINGS: The lungs are well-aerated. Vascular congestion is noted. Increased interstitial markings raise concern for mild pulmonary edema. Mild bibasilar atelectasis is seen. There is no evidence of pleural effusion or pneumothorax. The heart is mildly enlarged. A pacemaker is noted at the left chest wall, with leads ending at the right atrium and right ventricle. No acute osseous abnormalities are seen. IMPRESSION: Vascular congestion and  mild cardiomegaly. Increased interstitial markings raise concern for mild pulmonary edema. Mild bibasilar atelectasis noted. Electronically Signed   By: Roanna Raider M.D.   On: 04/20/2016 04:35   Dg Chest Port 1 View  Result Date: 04/21/2016 CLINICAL DATA:  Shortness of breath. History of CHF, diabetes, coronary artery disease with MI. EXAM: PORTABLE CHEST 1 VIEW COMPARISON:  PA and lateral chest x-ray of April 20, 2016 FINDINGS: The lungs are well-expanded. The interstitial markings remain increased but have improved somewhat. The cardiac silhouette is enlarged. The pulmonary vascularity is mildly prominent centrally. There is calcification in the wall of the aortic arch. The ICD is in stable position. The observed bony thorax is unremarkable. IMPRESSION: Slight interval improvement in the appearance of the pulmonary interstitium suggests decreased interstitial edema. Decreased right basilar subsegmental atelectasis. Persistent mild cardiomegaly. Thoracic aortic atherosclerosis. Electronically Signed   By: David  [place]aziland M.D.   On: 04/21/2016 10:21   Dg Abd Portable 1v  Result Date: 04/20/2016 CLINICAL DATA:  Abdominal pain with nausea and vomiting for 3 days. EXAM: PORTABLE ABDOMEN - 1 VIEW COMPARISON:  10/07/2006 abdominal CT FINDINGS: Mild gaseous distention of the stomach. Formed stool and gas seen in the colon without obstruction or rectal impaction. No evidence of small bowel obstruction. No concerning mass effect or gas collection. Contrast is present in the bladder after recent cardiac catheterization. IMPRESSION: 1. Nonobstructive bowel gas pattern. 2. Mild gaseous distention of the stomach. Electronically Signed   By: Marnee Spring M.D.   On: 04/20/2016 16:45    Cardiac Studies     Patient Profile     82 y/o with large NSTEMI and significant CAD.  Assessment & Plan    1) CAD/MI: Had a discussion with the patient.  I don't think she would be a good candidate for CABG due to her  age and overall frailty.  SHe is also hesitant about considering open heart surgery.  She is oriented and capable of making her own decisions at this time.  When given the option of PCI, she prefers this to open heart surgery.  I think this is reasonable given her overall status.  The question will be timing of PCI.  Would like for her respiratory status to improve before bringing her back to the cath lab.  She is open to intubation if needed.    Chest xray shows decreased edema.  Wean oxygen as tolerated.  Would plan on intervention on the circumflex which appears to be the culprit lesion. Stent of the main vessel with rescue of the OM branch.    Continue heparin and NTG.  Plan for PCI possibly later today, or Monday depending on her clinical status.    D/w Granddaughter in the room.  WIll have to call the other grand daughter who has POA as wel. Strained social situation in the family.  Signed, Lance MussJayadeep Varanasi, MD  04/21/2016, 10:51 AM

## 2016-04-21 NOTE — Progress Notes (Signed)
ANTICOAGULATION CONSULT NOTE - Follow-up Consult  Pharmacy Consult for Heparin Indication: NSTEMI  Allergies  Allergen Reactions  . Codeine Sulfate Shortness Of Breath and Other (See Comments)    Felt funny  . Simvastatin Other (See Comments)     choking, acid reflux    Patient Measurements: Height: 5\' 4"  (162.6 cm) Weight: 141 lb (64 kg) IBW/kg (Calculated) : 54.7 Heparin Dosing Weight: 64 kg  Vital Signs: Temp: 99.3 F (37.4 C) (12/01 0740) Temp Source: Axillary (12/01 0740) BP: 107/58 (12/01 0700) Pulse Rate: 70 (12/01 0700)  Labs:  Recent Labs  04/20/16 0349 04/20/16 0408 04/20/16 0611 04/20/16 1430 04/20/16 1823 04/21/16 0019 04/21/16 0911  HGB 13.2 13.6  --   --   --  12.0  --   HCT 39.2 40.0  --   --   --  36.5  --   PLT 131*  --   --   --   --  116*  --   LABPROT  --   --  14.2  --   --   --   --   INR  --   --  1.09  --   --   --   --   HEPARINUNFRC  --   --   --   --   --  0.23* 0.30  CREATININE 1.22* 1.20* 1.23*  --  1.34* 1.35*  --   TROPONINI  --   --  5.28* 52.69* 42.28*  --   --     Estimated Creatinine Clearance: 25.4 mL/min (by C-G formula based on SCr of 1.35 mg/dL (H)).  Assessment: 82 y.o. F presents with CP. Found to have NSTEMI. Noted pt on coumadin PTA but she is unsure if she was taking or not or when her last dose was. Admission INR 1.09 (subtherapeutic). Heparin restarted post cath 11/30 which showed severe 2V disease.  Heparin level at low end of goal this morning at 0.3. She is not an ideal CABG candidate and cardiology is currently considering PCI. Timing currently uncertain, tentative plan for this afternoon versus Monday.   Will leave heparin at current rate given possibility of cath, if procedure delayed and patient holds over weekend may increase rate slightly.   Goal of Therapy:  Heparin level 0.3-0.7 units/ml Monitor platelets by anticoagulation protocol: Yes   Plan:  Continue heparin gtt at 900 units/hr Daily heparin  level and CBC  Sheppard CoilFrank Carlis Blanchard PharmD., BCPS Clinical Pharmacist Pager [phone] 04/21/2016 11:24 AM

## 2016-04-21 NOTE — Progress Notes (Signed)
RN has received several phone calls requesting information and updates reagarding pt.  Since no password has been established, RN explained to all callers (daughter, granddaughters) that information cannot and will not be shared over the phone in order to assure patient confidentiality.  HCPOA Marylene Landngela is to come in and establish a password later today after work.

## 2016-04-21 NOTE — Progress Notes (Signed)
ANTICOAGULATION CONSULT NOTE - Follow-up Consult  Pharmacy Consult for Heparin Indication: NSTEMI  Allergies  Allergen Reactions  . Codeine Sulfate Shortness Of Breath and Other (See Comments)    Felt funny  . Simvastatin Other (See Comments)     choking, acid reflux    Patient Measurements: Height: 5\' 4"  (162.6 cm) Weight: 141 lb (64 kg) IBW/kg (Calculated) : 54.7 Heparin Dosing Weight: 64 kg  Vital Signs: Temp: 98 F (36.7 C) (12/01 0019) Temp Source: Axillary (12/01 0019) BP: 111/63 (12/01 0000) Pulse Rate: 70 (12/01 0000)  Labs:  Recent Labs  04/20/16 0349 04/20/16 0408 04/20/16 0611 04/20/16 1430 04/20/16 1823 04/21/16 0019  HGB 13.2 13.6  --   --   --  12.0  HCT 39.2 40.0  --   --   --  36.5  PLT 131*  --   --   --   --  PENDING  LABPROT  --   --  14.2  --   --   --   INR  --   --  1.09  --   --   --   HEPARINUNFRC  --   --   --   --   --  0.23*  CREATININE 1.22* 1.20* 1.23*  --  1.34*  --   TROPONINI  --   --  5.28* 52.69* 42.28*  --     Estimated Creatinine Clearance: 25.5 mL/min (by C-G formula based on SCr of 1.34 mg/dL (H)).  Assessment: 82 y.o. F presents with CP. Found to have NSTEMI. Noted pt on coumadin PTA but she is unsure if she was taking or not or when her last dose was. Admission INR 1.09 (subtherapeutic). Heparin restarted post cath 11/30 which showed severe 2V disease - awaiting decisions regarding revascularization. Heparin level subtherapeutic (0.23) on gtt at 750 units/hr. No issues with line or bleeding reported per RN.  Goal of Therapy:  Heparin level 0.3-0.7 units/ml Monitor platelets by anticoagulation protocol: Yes   Plan:  Increase heparin gtt to 900 units/hr Will f/u heparin level in 8 hours Daily heparin level and CBC  Christoper Fabianaron Jaqualyn Juday, PharmD, BCPS Clinical pharmacist, pager [phone] 04/21/2016,1:22 AM

## 2016-04-22 DIAGNOSIS — I2721 Secondary pulmonary arterial hypertension: Secondary | ICD-10-CM

## 2016-04-22 DIAGNOSIS — I482 Chronic atrial fibrillation: Secondary | ICD-10-CM

## 2016-04-22 DIAGNOSIS — Z950 Presence of cardiac pacemaker: Secondary | ICD-10-CM

## 2016-04-22 DIAGNOSIS — I442 Atrioventricular block, complete: Secondary | ICD-10-CM

## 2016-04-22 LAB — BASIC METABOLIC PANEL
Anion gap: 9 (ref 5–15)
BUN: 30 mg/dL — AB (ref 6–20)
CHLORIDE: 99 mmol/L — AB (ref 101–111)
CO2: 28 mmol/L (ref 22–32)
CREATININE: 1.41 mg/dL — AB (ref 0.44–1.00)
Calcium: 8.8 mg/dL — ABNORMAL LOW (ref 8.9–10.3)
GFR calc Af Amer: 38 mL/min — ABNORMAL LOW (ref >60)
GFR calc non Af Amer: 32 mL/min — ABNORMAL LOW (ref >60)
GLUCOSE: 118 mg/dL — AB (ref 65–99)
Potassium: 3.5 mmol/L (ref 3.5–5.1)
SODIUM: 136 mmol/L (ref 135–145)

## 2016-04-22 LAB — GLUCOSE, CAPILLARY
GLUCOSE-CAPILLARY: 147 mg/dL — AB (ref 65–99)
GLUCOSE-CAPILLARY: 118 mg/dL — AB (ref 65–99)
GLUCOSE-CAPILLARY: 141 mg/dL — AB (ref 65–99)
GLUCOSE-CAPILLARY: 118 mg/dL — AB (ref 65–99)
Glucose-Capillary: 160 mg/dL — ABNORMAL HIGH (ref 65–99)
Glucose-Capillary: 169 mg/dL — ABNORMAL HIGH (ref 65–99)
Glucose-Capillary: 158 mg/dL — ABNORMAL HIGH (ref 65–99)
Glucose-Capillary: 161 mg/dL — ABNORMAL HIGH (ref 65–99)

## 2016-04-22 LAB — CBC
HCT: 32.7 % — ABNORMAL LOW (ref 36.0–46.0)
Hemoglobin: 10.6 g/dL — ABNORMAL LOW (ref 12.0–15.0)
MCH: 32.3 pg (ref 26.0–34.0)
MCHC: 32.4 g/dL (ref 30.0–36.0)
MCV: 99.7 fL (ref 78.0–100.0)
Platelets: 92 10*3/uL — ABNORMAL LOW (ref 150–400)
RBC: 3.28 MIL/uL — ABNORMAL LOW (ref 3.87–5.11)
RDW: 14 % (ref 11.5–15.5)
WBC: 7.9 10*3/uL (ref 4.0–10.5)

## 2016-04-22 LAB — ECHOCARDIOGRAM COMPLETE
E decel time: 155 msec
E/e' ratio: 14.14
FS: 20 % — AB (ref 28–44)
Height: 64 in
IVS/LV PW RATIO, ED: 0.87
LA ID, A-P, ES: 44 mm
LA diam end sys: 44 mm
LA diam index: 2.6 cm/m2
LA vol A4C: 71 ml
LA vol index: 43.8 mL/m2
LA vol: 74 mL
LV E/e' medial: 14.14
LV E/e'average: 14.14
LV PW d: 9.89 mm — AB (ref 0.6–1.1)
LV e' LATERAL: 8.77 cm/s
LVOT area: 2.84 cm2
LVOT diameter: 19 mm
Lateral S' vel: 11.3 cm/s
MV Dec: 155
MV Peak grad: 6 mmHg
MV pk A vel: 44.4 m/s
MV pk E vel: 124 m/s
P 1/2 time: 470 ms
PISA EROA: 0.05 cm2
RV sys press: 60 mmHg
Reg peak vel: 377 cm/s
TDI e' lateral: 8.77
TDI e' medial: 6.58
TR max vel: 377 cm/s
VTI: 127 cm
Weight: 2256 oz

## 2016-04-22 LAB — HEPARIN LEVEL (UNFRACTIONATED)
Heparin Unfractionated: 0.18 IU/mL — ABNORMAL LOW (ref 0.30–0.70)
Heparin Unfractionated: 0.3 IU/mL (ref 0.30–0.70)

## 2016-04-22 MED ORDER — HEPARIN BOLUS VIA INFUSION
1000.0000 [IU] | Freq: Once | INTRAVENOUS | Status: AC
  Administered 2016-04-22: 1000 [IU] via INTRAVENOUS
  Filled 2016-04-22: qty 1000

## 2016-04-22 MED ORDER — POLYETHYLENE GLYCOL 3350 17 G PO PACK: 17.0000 g | PACK | Freq: Every day | ORAL | Status: DC | PRN

## 2016-04-22 NOTE — Progress Notes (Signed)
ANTICOAGULATION CONSULT NOTE - Follow Up Consult  Pharmacy Consult for heparin Indication: NSTEMI  Labs:  Recent Labs  04/20/16 0349 04/20/16 0408  04/20/16 0611 04/20/16 1430 04/20/16 1823 04/21/16 0019 04/21/16 0911 04/21/16 1154 04/22/16 0355  HGB 13.2 13.6  --   --   --   --  12.0  --   --  10.6*  HCT 39.2 40.0  --   --   --   --  36.5  --   --  32.7*  PLT 131*  --   --   --   --   --  116*  --   --  92*  LABPROT  --   --   --  14.2  --   --   --   --   --   --   INR  --   --   --  1.09  --   --   --   --   --   --   HEPARINUNFRC  --   --   --   --   --   --  0.23* 0.30  --  0.18*  CREATININE 1.22* 1.20*  --  1.23*  --  1.34* 1.35*  --  1.39*  --   TROPONINI  --   --   < > 5.28* 52.69* 42.28*  --   --  23.46*  --   < > = values in this interval not displayed.   Assessment: 82yo female now subtherapeutic on heparin after one level at very low end of goal; no gtt issues overnight per RN; plan for heparin over the weekend to reduce thrombus burden.  Goal of Therapy:  Heparin level 0.3-0.7 units/ml   Plan:  Will give small bolus of 1000 units and increase gtt by 2-3 units/kg/hr to 1050 units/hr and check level in 8hr.  Vernard GamblesVeronda Sigfredo Schreier, PharmD, BCPS  04/22/2016,5:56 AM

## 2016-04-22 NOTE — Progress Notes (Signed)
ANTICOAGULATION CONSULT NOTE - Follow-up Consult  Pharmacy Consult for Heparin Indication: NSTEMI  Allergies  Allergen Reactions  . Codeine Sulfate Shortness Of Breath and Other (See Comments)    Felt funny  . Simvastatin Other (See Comments)     choking, acid reflux    Patient Measurements: Height: 5\' 4"  (162.6 cm) Weight: 141 lb (64 kg) IBW/kg (Calculated) : 54.7 Heparin Dosing Weight: 64 kg  Vital Signs: Temp: 98.4 F (36.9 C) (12/02 1148) Temp Source: Oral (12/02 1148) BP: 98/50 (12/02 1600) Pulse Rate: 72 (12/02 1600)  Labs:  Recent Labs  04/20/16 0349 04/20/16 0408  04/20/16 0611 04/20/16 1430 04/20/16 1823  04/21/16 0019 04/21/16 0911 04/21/16 1154 04/22/16 0355 04/22/16 1452  HGB 13.2 13.6  --   --   --   --   --  12.0  --   --  10.6*  --   HCT 39.2 40.0  --   --   --   --   --  36.5  --   --  32.7*  --   PLT 131*  --   --   --   --   --   --  116*  --   --  92*  --   LABPROT  --   --   --  14.2  --   --   --   --   --   --   --   --   INR  --   --   --  1.09  --   --   --   --   --   --   --   --   HEPARINUNFRC  --   --   --   --   --   --   < > 0.23* 0.30  --  0.18* 0.30  CREATININE 1.22* 1.20*  --  1.23*  --  1.34*  --  1.35*  --  1.39* 1.41*  --   TROPONINI  --   --   < > 5.28* 52.69* 42.28*  --   --   --  23.46*  --   --   < > = values in this interval not displayed.  Estimated Creatinine Clearance: 24.3 mL/min (by C-G formula based on SCr of 1.41 mg/dL (H)).  Assessment: 82 y.o. F presents with CP. Found to have NSTEMI. Noted pt on coumadin PTA but she is unsure if she was taking or not or when her last dose was. Admission INR 1.09 (subtherapeutic). Heparin restarted post cath 11/30 which showed severe 2V disease. Heparin level is now therapeutic at 0.3 but at the lower end of goal range.   Goal of Therapy:  Heparin level 0.3-0.7 units/ml Monitor platelets by anticoagulation protocol: Yes   Plan:  Increase heparin gtt slightly to 1100  units/hr Daily heparin level and CBC  Lysle Pearlachel Donica Derouin, PharmD, BCPS Pager # [phone] 04/22/2016 4:13 PM

## 2016-04-22 NOTE — Progress Notes (Signed)
Patient Name: Maria BrunsMaxine F Southard Date of Encounter: 04/22/2016  [facility]  Hospital Problem List     Active Problems:   ATRIAL FIBRILLATION   PACEMAKER, PERMANENT   NSTEMI (non-ST elevated myocardial infarction) (HCC)   Abdominal pain   CHB (complete heart block) (HCC) post AVN ablation     Subjective   No angina or dyspnea at rest. Echo shows extensive area of abnormal wall motion in the LCX distribution. Restrictive filling. Moderate pulmonary HTN, with signs of chronicity. No problems at cath access site.  Inpatient Medications    Scheduled Meds: . aspirin EC  81 mg Oral Daily  . atorvastatin  40 mg Oral q1800  . ferrous sulfate  325 mg Oral Q breakfast  . furosemide  40 mg Intravenous BID  . insulin aspart  0-9 Units Subcutaneous TID WC  . irbesartan  150 mg Oral Daily  . levothyroxine  75 mcg Oral QAC breakfast  . mouth rinse  15 mL Mouth Rinse BID  . potassium chloride  10 mEq Oral Daily   Continuous Infusions: . heparin 1,050 Units/hr (04/22/16 0558)  . nitroGLYCERIN 8 mcg/min (04/21/16 2139)   PRN Meds: acetaminophen, hyoscyamine, meclizine, nitroGLYCERIN, ondansetron (ZOFRAN) IV, polyethylene glycol, traMADol   Vital Signs    Vitals:   04/22/16 0600 04/22/16 0700 04/22/16 0800 04/22/16 0827  BP: (!) 95/54 (!) 96/49 (!) 98/51 (!) 98/51  Pulse: 70 70 70 70  Resp: (!) 25 19 (!) 23 15  Temp:    98.4 F (36.9 C)  TempSrc:    Oral  SpO2: 95% 94% 93% 95%  Weight:      Height:        Intake/Output Summary (Last 24 hours) at 04/22/16 1104 Last data filed at 04/22/16 1057  Gross per 24 hour  Intake          1445.94 ml  Output              875 ml  Net           570.94 ml   Filed Weights   04/20/16 0347  Weight: 141 lb (64 kg)    Physical Exam   GEN: Well nourished, well developed, in no acute distress.  HEENT: Grossly normal.  Neck: Supple, no JVD, carotid bruits, or masses. Cardiac: RRR, split S2, 3/6 holosystolic LLSB and  apical murmur, no diastolic murmurs, rubs, or gallops. No clubbing, cyanosis, edema.  Radials/DP/PT 2+ and equal bilaterally.  Respiratory:  Respirations regular and unlabored, clear to auscultation bilaterally. GI: Soft, nontender, nondistended, BS + x 4. MS: no deformity or atrophy. Skin: warm and dry, no rash. Neuro:  Strength and sensation are intact. Psych: AAOx3.  Normal affect.  Labs    CBC  Recent Labs  04/20/16 0349  04/21/16 0019 04/22/16 0355  WBC 11.9*  --  11.0* 7.9  NEUTROABS 8.1*  --   --   --   HGB 13.2  < > 12.0 10.6*  HCT 39.2  < > 36.5 32.7*  MCV 98.0  --  98.4 99.7  PLT 131*  --  116* 92*  < > = values in this interval not displayed. Basic Metabolic Panel  Recent Labs  04/20/16 0349  04/21/16 1154 04/22/16 0355  NA 136  < > 138 136  K 3.3*  < > 3.8 3.5  CL 102  < > 101 99*  CO2 23  < > 27 28  GLUCOSE 236*  < > 122* 118*  BUN 14  < > 24* 30*  CREATININE 1.22*  < > 1.39* 1.41*  CALCIUM 10.1  < > 9.2 8.8*  MG 1.7  --   --   --   < > = values in this interval not displayed. Liver Function Tests  Recent Labs  04/20/16 0349  AST 33  ALT 13*  ALKPHOS 86  BILITOT 0.5  PROT 6.9  ALBUMIN 3.7   No results for input(s): LIPASE, AMYLASE in the last 72 hours. Cardiac Enzymes  Recent Labs  04/20/16 1430 04/20/16 1823 04/21/16 1154  TROPONINI 52.69* 42.28* 23.46*   BNP Invalid input(s): POCBNP D-Dimer No results for input(s): DDIMER in the last 72 hours. Hemoglobin A1C No results for input(s): HGBA1C in the last 72 hours. Fasting Lipid Panel  Recent Labs  04/21/16 0019  CHOL 176  HDL 37*  LDLCALC 121*  TRIG 91  CHOLHDL 4.8   Thyroid Function Tests  Recent Labs  04/20/16 0611  TSH 2.742    Telemetry    AFib, V paced - Personally Reviewed  ECG    100% V paced, nondiagnostic - Personally Reviewed  Radiology    Dg Chest Port 1 View  Result Date: 04/21/2016 CLINICAL DATA:  Shortness of breath. History of CHF, diabetes,  coronary artery disease with MI. EXAM: PORTABLE CHEST 1 VIEW COMPARISON:  PA and lateral chest x-ray of April 20, 2016 FINDINGS: The lungs are well-expanded. The interstitial markings remain increased but have improved somewhat. The cardiac silhouette is enlarged. The pulmonary vascularity is mildly prominent centrally. There is calcification in the wall of the aortic arch. The ICD is in stable position. The observed bony thorax is unremarkable. IMPRESSION: Slight interval improvement in the appearance of the pulmonary interstitium suggests decreased interstitial edema. Decreased right basilar subsegmental atelectasis. Persistent mild cardiomegaly. Thoracic aortic atherosclerosis. Electronically Signed   By: David  [place]and M.D.   On: 04/21/2016 10:21   Dg Abd Portable 1v  Result Date: 04/20/2016 CLINICAL DATA:  Abdominal pain with nausea and vomiting for 3 days. EXAM: PORTABLE ABDOMEN - 1 VIEW COMPARISON:  10/07/2006 abdominal CT FINDINGS: Mild gaseous distention of the stomach. Formed stool and gas seen in the colon without obstruction or rectal impaction. No evidence of small bowel obstruction. No concerning mass effect or gas collection. Contrast is present in the bladder after recent cardiac catheterization. IMPRESSION: 1. Nonobstructive bowel gas pattern. 2. Mild gaseous distention of the stomach. Electronically Signed   By: Marnee Spring M.D.   On: 04/20/2016 16:45    Cardiac Studies   Echo today:  - Left ventricle: The cavity size was normal. Systolic function was mildly to moderately reduced. The estimated ejection fraction was in the range of 40% to 45%. Akinesis of the inferolateral myocardium; consistent with infarction in the distribution of the left circumflex coronary artery. Doppler parameters are consistent with restrictive physiology, indicative of decreased left ventricular diastolic compliance and/or increased left   atrial pressure. - Ventricular septum: Septal motion showed  paradox. These changes   are consistent with right ventricular pacing. - Mitral valve: Calcified annulus. There was moderate regurgitation   directed centrally. - Left atrium: The atrium was moderately to severely dilated. - Right ventricle: The cavity size was moderately dilated. Systolic   function was mildly reduced. - Right atrium: The atrium was moderately to severely dilated. - Tricuspid valve: There was moderate regurgitation. - Pulmonary arteries: Systolic pressure was moderately increased.   PA peak pressure: 65 mm Hg (S).  Cath 04/20/16:  Diagnostic Diagram     Implants     No implant documentation for this case.     Hemo Data   Flowsheet Row Most Recent Value  AO Systolic Pressure 118 mmHg  AO Diastolic Pressure 57 mmHg  AO Mean 80 mmHg  LV Systolic Pressure 116 mmHg  LV Diastolic Pressure 15 mmHg  LV EDP 25 mmHg  Arterial Occlusion Pressure Extended Systolic Pressure 115 mmHg  Arterial Occlusion Pressure Extended Diastolic Pressure 54 mmHg  Arterial Occlusion Pressure Extended Mean Pressure 78 mmHg  Left Ventricular Apex Extended Systolic Pressure 116 mmHg  Left Ventricular Apex Extended Diastolic Pressure 15 mmHg  Left Ventricular Apex Extended EDP Pressure 25 mmHg     Patient Profile     82 yo pacemaker dependent patient (s/p AV node ablation for permanent AFib) presents with CHF exacerbation and acute MI and has a high grade LCx stenosis as well as a moderate to severe mid LAD stenosis. After surgical consultation, felt to be best suited for PCI/stent LCX on Monday.  Assessment & Plan    1. CAD s/p lateral MI: on IV heparin. For PCI Monday with Dr, Tresa EndoKelly, after hemodynamic optimization. 2. Acute  systolic (on chronic diastolic heart failure): still with evidence of elevated mean LA pressure by echo today. Continue diuresis. 3. CHB: pacemaker dependent after AV node ablation. 4. AFib: permanent, will need continued anticoagulation - to be kept in mind  when making stent choice. 5. PAH: appears chronic and disproportionate to the acuity of systolic left heart failure. We may be underestimating the degree of MR, she may have cardiac amyloidosis or there may be another reason for PAH. Reevaluate after PCI. 6. Pacemaker: normal function at last check 02/29/16.  Signed, Thurmon FairMihai Heiley Shaikh, MD  04/22/2016, 11:04 AM

## 2016-04-23 DIAGNOSIS — I2721 Secondary pulmonary arterial hypertension: Secondary | ICD-10-CM

## 2016-04-23 LAB — CBC
HCT: 31 % — ABNORMAL LOW (ref 36.0–46.0)
Hemoglobin: 10.2 g/dL — ABNORMAL LOW (ref 12.0–15.0)
MCH: 32.6 pg (ref 26.0–34.0)
MCHC: 32.9 g/dL (ref 30.0–36.0)
MCV: 99 fL (ref 78.0–100.0)
Platelets: 93 10*3/uL — ABNORMAL LOW (ref 150–400)
RBC: 3.13 MIL/uL — ABNORMAL LOW (ref 3.87–5.11)
RDW: 13.8 % (ref 11.5–15.5)
WBC: 6.6 10*3/uL (ref 4.0–10.5)

## 2016-04-23 LAB — GLUCOSE, CAPILLARY
GLUCOSE-CAPILLARY: 138 mg/dL — AB (ref 65–99)
GLUCOSE-CAPILLARY: 129 mg/dL — AB (ref 65–99)
GLUCOSE-CAPILLARY: 141 mg/dL — AB (ref 65–99)
Glucose-Capillary: 156 mg/dL — ABNORMAL HIGH (ref 65–99)

## 2016-04-23 LAB — HEPARIN LEVEL (UNFRACTIONATED): HEPARIN UNFRACTIONATED: 0.33 [IU]/mL (ref 0.30–0.70)

## 2016-04-23 MED ORDER — SODIUM CHLORIDE 0.9 % WEIGHT BASED INFUSION
1.0000 mL/kg/h | INTRAVENOUS | Status: DC
  Administered 2016-04-23: 1 mL/kg/h via INTRAVENOUS
  Administered 2016-04-24: 1.563 mL/kg/h via INTRAVENOUS

## 2016-04-23 MED ORDER — ASPIRIN 81 MG PO CHEW
81.0000 mg | CHEWABLE_TABLET | ORAL | Status: AC
  Administered 2016-04-24: 81 mg via ORAL
  Filled 2016-04-23: qty 1

## 2016-04-23 MED ORDER — SODIUM CHLORIDE 0.9% FLUSH: 3.0000 mL | INTRAVENOUS | Status: DC | PRN

## 2016-04-23 MED ORDER — CLOPIDOGREL BISULFATE 75 MG PO TABS
75.0000 mg | ORAL_TABLET | Freq: Every day | ORAL | Status: DC
  Administered 2016-04-23 – 2016-05-01 (×8): 75 mg via ORAL
  Filled 2016-04-23 (×8): qty 1

## 2016-04-23 MED ORDER — SODIUM CHLORIDE 0.9 % IV SOLN: 250.0000 mL | INTRAVENOUS | Status: DC | PRN

## 2016-04-23 MED ORDER — CLOPIDOGREL BISULFATE 300 MG PO TABS
300.0000 mg | ORAL_TABLET | Freq: Once | ORAL | Status: AC
  Administered 2016-04-23: 300 mg via ORAL
  Filled 2016-04-23: qty 1

## 2016-04-23 MED ORDER — SODIUM CHLORIDE 0.9% FLUSH
3.0000 mL | Freq: Two times a day (BID) | INTRAVENOUS | Status: DC
  Administered 2016-04-23 – 2016-04-24 (×2): 3 mL via INTRAVENOUS

## 2016-04-23 NOTE — Progress Notes (Signed)
Patient consent signed and in chart. Patient watched cath videos. All questions answered to patient satisfaction.

## 2016-04-23 NOTE — Progress Notes (Signed)
ANTICOAGULATION CONSULT NOTE - Follow-up Consult  Pharmacy Consult for Heparin Indication: NSTEMI  Allergies  Allergen Reactions  . Codeine Sulfate Shortness Of Breath and Other (See Comments)    Felt funny  . Simvastatin Other (See Comments)     choking, acid reflux    Patient Measurements: Height: 5\' 4"  (162.6 cm) Weight: 141 lb (64 kg) IBW/kg (Calculated) : 54.7 Heparin Dosing Weight: 64 kg  Vital Signs: Temp: 97.7 F (36.5 C) (12/03 0400) Temp Source: Oral (12/03 0400) BP: 110/65 (12/03 0400) Pulse Rate: 72 (12/03 0400)  Labs:  Recent Labs  04/20/16 1430  04/20/16 1823  04/21/16 0019  04/21/16 1154 04/22/16 0355 04/22/16 1452 04/23/16 0132  HGB  --   --   --   < > 12.0  --   --  10.6*  --  10.2*  HCT  --   --   --   --  36.5  --   --  32.7*  --  31.0*  PLT  --   --   --   --  116*  --   --  92*  --  93*  HEPARINUNFRC  --   --   --   --  0.23*  < >  --  0.18* 0.30 0.33  CREATININE  --   < > 1.34*  --  1.35*  --  1.39* 1.41*  --   --   TROPONINI 52.69*  --  42.28*  --   --   --  23.46*  --   --   --   < > = values in this interval not displayed.  Estimated Creatinine Clearance: 24.3 mL/min (by C-G formula based on SCr of 1.41 mg/dL (H)).  Assessment: 82 y.o. F presents with NSTEMI. Noted pt on coumadin PTA but she is unsure if she was taking or not or when her last dose was. Admission INR 1.09 (subtherapeutic). Heparin restarted post cath 11/30 which showed severe 2V disease. Heparin level is now therapeutic at 0.33. Hgb remains low but stable, plt low but stable as well.   Goal of Therapy:  Heparin level 0.3-0.7 units/ml Monitor platelets by anticoagulation protocol: Yes   Plan:  Continue heparin gtt at 1100 units/hr Daily heparin level and CBC  Kailyn Vanderslice K. Bonnye FavaNicolsen, PharmD, BCPS, CPP Clinical Pharmacist Pager: [phone] Phone: [phone] 04/23/2016 7:37 AM

## 2016-04-23 NOTE — Progress Notes (Signed)
 Patient Name: [name] Date of Encounter: 04/23/2016  Primary Cardiologist: [facility] Problem List     Principal Problem:   NSTEMI (non-ST elevated myocardial infarction) (HCC) Active Problems:   ATRIAL FIBRILLATION   PACEMAKER, PERMANENT   Abdominal pain   CHB (complete heart block) (HCC) post AVN ablation   PAH (pulmonary artery hypertension)     Subjective   Essentially asymptomatic at rest. Lying fully flat in bed with legs elevated, no dyspnea reported. Occasional fleeting chest discomfort, different from presenting angina.  Inpatient Medications    Scheduled Meds: . aspirin EC  81 mg Oral Daily  . atorvastatin  40 mg Oral q1800  . ferrous sulfate  325 mg Oral Q breakfast  . furosemide  40 mg Intravenous BID  . insulin aspart  0-9 Units Subcutaneous TID WC  . irbesartan  150 mg Oral Daily  . levothyroxine  75 mcg Oral QAC breakfast  . mouth rinse  15 mL Mouth Rinse BID  . potassium chloride  10 mEq Oral Daily   Continuous Infusions: . heparin 1,100 Units/hr (04/22/16 2019)  . nitroGLYCERIN 6 mcg/min (04/23/16 0925)   PRN Meds: acetaminophen, hyoscyamine, meclizine, nitroGLYCERIN, ondansetron (ZOFRAN) IV, polyethylene glycol, traMADol   Vital Signs    Vitals:   04/23/16 0200 04/23/16 0300 04/23/16 0400 04/23/16 0829  BP: (!) 100/58 (!) 103/57 110/65   Pulse: 70 70 72   Resp: (!) 30 (!) 29 (!) 22   Temp:   97.7 F (36.5 C) 98.7 F (37.1 C)  TempSrc:   Oral Oral  SpO2: 96% 96% 94%   Weight:      Height:        Intake/Output Summary (Last 24 hours) at 04/23/16 0936 Last data filed at 04/23/16 0923  Gross per 24 hour  Intake            734.1 ml  Output             2050 ml  Net          -1315.9 ml   Filed Weights   04/20/16 0347  Weight: 141 lb (64 kg)    Physical Exam  GEN: Well nourished, well developed, in no acute distress.  HEENT: Grossly normal.  Neck: Supple, no JVD, carotid bruits, or masses. Cardiac: RRR, split S2,  3/6 holosystolic LLSB and apical murmur, no diastolic murmurs, rubs, or gallops. No clubbing, cyanosis, edema.  Radials/DP/PT 2+ and equal bilaterally.  Respiratory:  Respirations regular and unlabored, clear to auscultation bilaterally. GI: Soft, nontender, nondistended, BS + x 4. MS: no deformity or atrophy. Skin: warm and dry, no rash. Neuro:  Strength and sensation are intact. Psych: AAOx3.  Normal affect.  Labs    CBC  Recent Labs  04/22/16 0355 04/23/16 0132  WBC 7.9 6.6  HGB 10.6* 10.2*  HCT 32.7* 31.0*  MCV 99.7 99.0  PLT 92* 93*   Basic Metabolic Panel  Recent Labs  04/21/16 1154 04/22/16 0355  NA 138 136  K 3.8 3.5  CL 101 99*  CO2 27 28  GLUCOSE 122* 118*  BUN 24* 30*  CREATININE 1.39* 1.41*  CALCIUM 9.2 8.8*   Liver Function Tests No results for input(s): AST, ALT, ALKPHOS, BILITOT, PROT, ALBUMIN in the last 72 hours. No results for input(s): LIPASE, AMYLASE in the last 72 hours. Cardiac Enzymes  Recent Labs  04/20/16 1430 04/20/16 1823 04/21/16 1154  TROPONINI 52.69* 42.28* 23.46*   BNP Invalid input(s): POCBNP D-Dimer No   results for input(s): DDIMER in the last 72 hours. Hemoglobin A1C No results for input(s): HGBA1C in the last 72 hours. Fasting Lipid Panel  Recent Labs  04/21/16 0019  CHOL 176  HDL 37*  LDLCALC 121*  TRIG 91  CHOLHDL 4.8   Thyroid Function Tests No results for input(s): TSH, T4TOTAL, T3FREE, THYROIDAB in the last 72 hours.  Invalid input(s): FREET3  Telemetry    V paced, afib with CHB - Personally Reviewed  ECG    V paced, afib with CHB - Personally Reviewed  Radiology    Dg Chest Port 1 View  Result Date: 04/21/2016 CLINICAL DATA:  Shortness of breath. History of CHF, diabetes, coronary artery disease with MI. EXAM: PORTABLE CHEST 1 VIEW COMPARISON:  PA and lateral chest x-ray of April 20, 2016 FINDINGS: The lungs are well-expanded. The interstitial markings remain increased but have improved  somewhat. The cardiac silhouette is enlarged. The pulmonary vascularity is mildly prominent centrally. There is calcification in the wall of the aortic arch. The ICD is in stable position. The observed bony thorax is unremarkable. IMPRESSION: Slight interval improvement in the appearance of the pulmonary interstitium suggests decreased interstitial edema. Decreased right basilar subsegmental atelectasis. Persistent mild cardiomegaly. Thoracic aortic atherosclerosis. Electronically Signed   By: David  Jordan M.D.   On: 04/21/2016 10:21    Cardiac Studies   Echo 04/22/16:  - Left ventricle: The cavity size was normal. Systolic function wasmildly to moderately reduced. The estimated ejection fraction wasin the range of 40% to 45%. Akinesis of the inferolateralmyocardium; consistent with infarction in the distribution of theleft circumflex coronary artery. Doppler parameters areconsistent with restrictive physiology, indicative of decreasedleft ventricular diastolic compliance and/or increased left atrial pressure. - Ventricular septum: Septal motion showed paradox. These changes are consistent with right ventricular pacing. - Mitral valve: Calcified annulus. There was moderate regurgitation directed centrally. - Left atrium: The atrium was moderately to severely dilated. - Right ventricle: The cavity size was moderately dilated. Systolic function was mildly reduced. - Right atrium: The atrium was moderately to severely dilated. - Tricuspid valve: There was moderate regurgitation. - Pulmonary arteries: Systolic pressure was moderately increased. PA peak pressure: 65 mm Hg (S).  Cath 04/20/16: Diagnostic Diagram     Implants        No implant documentation for this case.     Hemo Data   Flowsheet Row Most Recent Value  AO Systolic Pressure 118 mmHg  AO Diastolic Pressure 57 mmHg  AO Mean 80 mmHg  LV Systolic Pressure 116 mmHg  LV Diastolic Pressure 15 mmHg  LV  EDP 25 mmHg  Arterial Occlusion Pressure Extended Systolic Pressure 115 mmHg  Arterial Occlusion Pressure Extended Diastolic Pressure 54 mmHg  Arterial Occlusion Pressure Extended Mean Pressure 78 mmHg  Left Ventricular Apex Extended Systolic Pressure 116 mmHg  Left Ventricular Apex Extended Diastolic Pressure 15 mmHg  Left Ventricular Apex Extended EDP Pressure 25 mmHg      Patient Profile     82 yo pacemaker dependent patient (s/p AV node ablation for permanent AFib) presents with CHF exacerbation and acute MI and has a high grade LCx stenosis as well as a moderate to severe mid LAD stenosis. After surgical consultation, felt to be best suited for PCI/stent LCX on Monday.  Assessment & Plan    1. CAD s/p lateral MI: on IV heparin. For PCI Monday 0900h Dr. Kelly, after hemodynamic optimization. 2. Acute  systolic (on chronic diastolic heart failure): still with evidence of elevated mean   LA pressure by echo yesterday. Net -700 mL yesterday. Hold diuresis. Stable creatinine, mildly elevated. 3. CHB: pacemaker dependent after AV node ablation. 4. AFib: permanent, will need continued anticoagulation - to be kept in mind when making stent choice. 5. PAH: appears chronic and disproportionate to the acuity of systolic left heart failure. We may be underestimating the degree of MR, she may have cardiac amyloidosis or there may be another reason for PAH. Reevaluate after PCI. 6. Pacemaker: normal function at last check 02/29/16.  Signed, Aleksa Collinsworth, MD  04/23/2016, 9:36 AM   

## 2016-04-23 NOTE — Progress Notes (Signed)
Spoke with Fleet Contrasachel in Pharmacy who stated she verified 300mg  plavix order with Dr. Eldridge DaceVaranasi [facility] She stated that physician was aware of heparin gtt and 75mg  plavix order and that he did want 300mg  plavix given tonight for pending heart cath tomorrow.

## 2016-04-24 ENCOUNTER — Encounter (HOSPITAL_COMMUNITY)
Admission: EM | Disposition: A | Discharge status: Skilled Nursing Facility | Payer: Self-pay | Source: Home / Self Care | Attending: Interventional Cardiology

## 2016-04-24 DIAGNOSIS — N183 Chronic kidney disease, stage 3 (moderate): Secondary | ICD-10-CM

## 2016-04-24 DIAGNOSIS — N170 Acute kidney failure with tubular necrosis: Secondary | ICD-10-CM

## 2016-04-24 HISTORY — PX: CARDIAC CATHETERIZATION: SHX172

## 2016-04-24 LAB — CBC
HCT: 30.1 % — ABNORMAL LOW (ref 36.0–46.0)
Hemoglobin: 9.9 g/dL — ABNORMAL LOW (ref 12.0–15.0)
MCH: 32.6 pg (ref 26.0–34.0)
MCHC: 32.9 g/dL (ref 30.0–36.0)
MCV: 99 fL (ref 78.0–100.0)
Platelets: 92 10*3/uL — ABNORMAL LOW (ref 150–400)
RBC: 3.04 MIL/uL — ABNORMAL LOW (ref 3.87–5.11)
RDW: 13.5 % (ref 11.5–15.5)
WBC: 5.9 10*3/uL (ref 4.0–10.5)

## 2016-04-24 LAB — GLUCOSE, CAPILLARY
Glucose-Capillary: 122 mg/dL — ABNORMAL HIGH (ref 65–99)
Glucose-Capillary: 120 mg/dL — ABNORMAL HIGH (ref 65–99)
Glucose-Capillary: 155 mg/dL — ABNORMAL HIGH (ref 65–99)

## 2016-04-24 LAB — POCT ACTIVATED CLOTTING TIME: Activated Clotting Time: 549 seconds

## 2016-04-24 LAB — HEPARIN LEVEL (UNFRACTIONATED): Heparin Unfractionated: 0.22 [IU]/mL — ABNORMAL LOW (ref 0.30–0.70)

## 2016-04-24 SURGERY — CORONARY STENT INTERVENTION
Anesthesia: LOCAL

## 2016-04-24 MED ORDER — BIVALIRUDIN BOLUS VIA INFUSION - CUPID
INTRAVENOUS | Status: DC | PRN
  Administered 2016-04-24: 51 mg via INTRAVENOUS

## 2016-04-24 MED ORDER — NITROGLYCERIN 1 MG/10 ML FOR IR/CATH LAB
INTRA_ARTERIAL | Status: AC
  Filled 2016-04-24: qty 10

## 2016-04-24 MED ORDER — SODIUM CHLORIDE 0.9 % IV SOLN
INTRAVENOUS | Status: DC
  Administered 2016-04-25: 1 mL via INTRAVENOUS

## 2016-04-24 MED ORDER — SODIUM CHLORIDE 0.9% FLUSH
3.0000 mL | Freq: Two times a day (BID) | INTRAVENOUS | Status: DC
Start: 2016-04-24 — End: 2016-04-26
  Administered 2016-04-25: 3 mL via INTRAVENOUS

## 2016-04-24 MED ORDER — BIVALIRUDIN 250 MG IV SOLR
INTRAVENOUS | Status: AC
Start: 2016-04-24 — End: 2016-04-24
  Filled 2016-04-24: qty 250

## 2016-04-24 MED ORDER — FENTANYL CITRATE (PF) 100 MCG/2ML IJ SOLN
INTRAMUSCULAR | Status: AC
  Filled 2016-04-24: qty 2

## 2016-04-24 MED ORDER — MIDAZOLAM HCL 2 MG/2ML IJ SOLN
INTRAMUSCULAR | Status: DC | PRN
  Administered 2016-04-24 (×2): 1 mg via INTRAVENOUS

## 2016-04-24 MED ORDER — ONDANSETRON HCL 4 MG/2ML IJ SOLN
4.0000 mg | Freq: Four times a day (QID) | INTRAMUSCULAR | Status: DC | PRN
  Administered 2016-04-24: 4 mg via INTRAVENOUS
  Filled 2016-04-24: qty 2

## 2016-04-24 MED ORDER — FENTANYL CITRATE (PF) 100 MCG/2ML IJ SOLN
INTRAMUSCULAR | Status: DC | PRN
  Administered 2016-04-24: 25 ug via INTRAVENOUS

## 2016-04-24 MED ORDER — HEPARIN (PORCINE) IN NACL 2-0.9 UNIT/ML-% IJ SOLN
INTRAMUSCULAR | Status: DC | PRN
  Administered 2016-04-24: 1000 mL

## 2016-04-24 MED ORDER — LABETALOL HCL 5 MG/ML IV SOLN: 10.0000 mg | INTRAVENOUS | Status: AC | PRN

## 2016-04-24 MED ORDER — HYDRALAZINE HCL 20 MG/ML IJ SOLN: 5.0000 mg | INTRAMUSCULAR | Status: AC | PRN

## 2016-04-24 MED ORDER — ASPIRIN 81 MG PO CHEW: 81.0000 mg | CHEWABLE_TABLET | Freq: Every day | ORAL | Status: DC

## 2016-04-24 MED ORDER — NITROGLYCERIN 1 MG/10 ML FOR IR/CATH LAB
INTRA_ARTERIAL | Status: DC | PRN
  Administered 2016-04-24: 200 ug via INTRACORONARY
  Administered 2016-04-24: 100 ug via INTRACORONARY
  Administered 2016-04-24: 200 ug via INTRACORONARY
  Administered 2016-04-24: 100 ug via INTRACORONARY

## 2016-04-24 MED ORDER — HEPARIN (PORCINE) IN NACL 2-0.9 UNIT/ML-% IJ SOLN
INTRAMUSCULAR | Status: AC
  Filled 2016-04-24: qty 1500

## 2016-04-24 MED ORDER — ACETAMINOPHEN 325 MG PO TABS
650.0000 mg | ORAL_TABLET | ORAL | Status: DC | PRN
  Administered 2016-04-25 – 2016-04-29 (×2): 650 mg via ORAL
  Filled 2016-04-24 (×2): qty 2

## 2016-04-24 MED ORDER — IOPAMIDOL (ISOVUE-370) INJECTION 76%
INTRAVENOUS | Status: AC
  Filled 2016-04-24: qty 50

## 2016-04-24 MED ORDER — SODIUM CHLORIDE 0.9 % IV SOLN
INTRAVENOUS | Status: DC | PRN
  Administered 2016-04-24: 0.25 mg/kg/h via INTRAVENOUS

## 2016-04-24 MED ORDER — LIDOCAINE HCL (PF) 1 % IJ SOLN
INTRAMUSCULAR | Status: AC
  Filled 2016-04-24: qty 30

## 2016-04-24 MED ORDER — LIDOCAINE HCL (PF) 1 % IJ SOLN
INTRAMUSCULAR | Status: DC | PRN
  Administered 2016-04-24: 15 mL

## 2016-04-24 MED ORDER — DIAZEPAM 2 MG PO TABS
2.0000 mg | ORAL_TABLET | Freq: Four times a day (QID) | ORAL | Status: DC | PRN
  Administered 2016-04-24 – 2016-04-28 (×4): 2 mg via ORAL
  Filled 2016-04-24 (×4): qty 1

## 2016-04-24 MED ORDER — SODIUM CHLORIDE 0.9 % IV SOLN: INTRAVENOUS | Status: DC | PRN

## 2016-04-24 MED ORDER — IOPAMIDOL (ISOVUE-370) INJECTION 76%
INTRAVENOUS | Status: DC | PRN
  Administered 2016-04-24: 165 mL via INTRA_ARTERIAL

## 2016-04-24 MED ORDER — CLOPIDOGREL BISULFATE 75 MG PO TABS
75.0000 mg | ORAL_TABLET | Freq: Every day | ORAL | Status: DC
Start: 2016-04-25 — End: 2016-04-24

## 2016-04-24 MED ORDER — IOPAMIDOL (ISOVUE-370) INJECTION 76%
INTRAVENOUS | Status: AC
  Filled 2016-04-24: qty 125

## 2016-04-24 MED ORDER — SODIUM CHLORIDE 0.9 % IV SOLN: 250.0000 mL | INTRAVENOUS | Status: DC | PRN

## 2016-04-24 MED ORDER — ATORVASTATIN CALCIUM 80 MG PO TABS
80.0000 mg | ORAL_TABLET | Freq: Every day | ORAL | Status: DC
  Administered 2016-04-25 – 2016-04-28 (×4): 80 mg via ORAL
  Filled 2016-04-24 (×4): qty 1

## 2016-04-24 MED ORDER — MIDAZOLAM HCL 2 MG/2ML IJ SOLN
INTRAMUSCULAR | Status: AC
  Filled 2016-04-24: qty 2

## 2016-04-24 MED ORDER — SODIUM CHLORIDE 0.9% FLUSH: 3.0000 mL | INTRAVENOUS | Status: DC | PRN

## 2016-04-24 SURGICAL SUPPLY — 20 items
BALLN MOZEC 2.0X12 (BALLOONS) ×2
BALLN MOZEC 3.0X14 (BALLOONS) ×2
BALLN ~~LOC~~ EUPHORA RX 4.0X15 (BALLOONS) ×2
BALLOON MOZEC 2.0X12 (BALLOONS) IMPLANT
BALLOON MOZEC 3.0X14 (BALLOONS) IMPLANT
BALLOON ~~LOC~~ EUPHORA RX 4.0X15 (BALLOONS) IMPLANT
CATH VISTA GUIDE 6FR XB3.5 (CATHETERS) ×1 IMPLANT
ELECT DEFIB PAD ADLT CADENCE (PAD) ×1 IMPLANT
KIT ENCORE 26 ADVANTAGE (KITS) ×1 IMPLANT
KIT HEART LEFT (KITS) ×2 IMPLANT
PACK CARDIAC CATHETERIZATION (CUSTOM PROCEDURE TRAY) ×2 IMPLANT
SHEATH PINNACLE 6F 10CM (SHEATH) ×1 IMPLANT
STENT RESOLUTE ONYX 3.5X18 (Permanent Stent) ×2 IMPLANT
TRANSDUCER W/STOPCOCK (MISCELLANEOUS) ×2 IMPLANT
TUBING CIL FLEX 10 FLL-RA (TUBING) ×2 IMPLANT
WIRE ASAHI PROWATER 180CM (WIRE) ×1 IMPLANT
WIRE COUGAR XT STRL 190CM (WIRE) ×1 IMPLANT
WIRE EMERALD 3MM-J .035X150CM (WIRE) ×1 IMPLANT
WIRE HI TORQ WHISPER MS 190CM (WIRE) ×1 IMPLANT
WIRE PT2 MS 185 (WIRE) ×1 IMPLANT

## 2016-04-24 NOTE — H&P (View-Only) (Signed)
Patient Name: [name] Date of Encounter: 04/23/2016  Primary Cardiologist: [facility] Problem List     Principal Problem:   NSTEMI (non-ST elevated myocardial infarction) [facility]) Active Problems:   ATRIAL FIBRILLATION   PACEMAKER, PERMANENT   Abdominal pain   CHB (complete heart block) (HCC) post AVN ablation   PAH (pulmonary artery hypertension)     Subjective   Essentially asymptomatic at rest. Lying fully flat in bed with legs elevated, no dyspnea reported. Occasional fleeting chest discomfort, different from presenting angina.  Inpatient Medications    Scheduled Meds: . aspirin EC  81 mg Oral Daily  . atorvastatin  40 mg Oral q1800  . ferrous sulfate  325 mg Oral Q breakfast  . furosemide  40 mg Intravenous BID  . insulin aspart  0-9 Units Subcutaneous TID WC  . irbesartan  150 mg Oral Daily  . levothyroxine  75 mcg Oral QAC breakfast  . mouth rinse  15 mL Mouth Rinse BID  . potassium chloride  10 mEq Oral Daily   Continuous Infusions: . heparin 1,100 Units/hr (04/22/16 2019)  . nitroGLYCERIN 6 mcg/min (04/23/16 0925)   PRN Meds: acetaminophen, hyoscyamine, meclizine, nitroGLYCERIN, ondansetron (ZOFRAN) IV, polyethylene glycol, traMADol   Vital Signs    Vitals:   04/23/16 0200 04/23/16 0300 04/23/16 0400 04/23/16 0829  BP: (!) 100/58 (!) 103/57 110/65   Pulse: 70 70 72   Resp: (!) 30 (!) 29 (!) 22   Temp:   97.7 F (36.5 C) 98.7 F (37.1 C)  TempSrc:   Oral Oral  SpO2: 96% 96% 94%   Weight:      Height:        Intake/Output Summary (Last 24 hours) at 04/23/16 0936 Last data filed at 04/23/16 4098  Gross per 24 hour  Intake            734.1 ml  Output             2050 ml  Net          -1315.9 ml   Filed Weights   04/20/16 0347  Weight: 141 lb (64 kg)    Physical Exam  GEN: Well nourished, well developed, in no acute distress.  HEENT: Grossly normal.  Neck: Supple, no JVD, carotid bruits, or masses. Cardiac: RRR, split S2,  3/6 holosystolic LLSB and apical murmur, no diastolic murmurs, rubs, or gallops. No clubbing, cyanosis, edema.  Radials/DP/PT 2+ and equal bilaterally.  Respiratory:  Respirations regular and unlabored, clear to auscultation bilaterally. GI: Soft, nontender, nondistended, BS + x 4. MS: no deformity or atrophy. Skin: warm and dry, no rash. Neuro:  Strength and sensation are intact. Psych: AAOx3.  Normal affect.  Labs    CBC  Recent Labs  04/22/16 0355 04/23/16 0132  WBC 7.9 6.6  HGB 10.6* 10.2*  HCT 32.7* 31.0*  MCV 99.7 99.0  PLT 92* 93*   Basic Metabolic Panel  Recent Labs  04/21/16 1154 04/22/16 0355  NA 138 136  K 3.8 3.5  CL 101 99*  CO2 27 28  GLUCOSE 122* 118*  BUN 24* 30*  CREATININE 1.39* 1.41*  CALCIUM 9.2 8.8*   Liver Function Tests No results for input(s): AST, ALT, ALKPHOS, BILITOT, PROT, ALBUMIN in the last 72 hours. No results for input(s): LIPASE, AMYLASE in the last 72 hours. Cardiac Enzymes  Recent Labs  04/20/16 1430 04/20/16 1823 04/21/16 1154  TROPONINI 52.69* 42.28* 23.46*   BNP Invalid input(s): POCBNP D-Dimer No  results for input(s): DDIMER in the last 72 hours. Hemoglobin A1C No results for input(s): HGBA1C in the last 72 hours. Fasting Lipid Panel  Recent Labs  04/21/16 0019  CHOL 176  HDL 37*  LDLCALC 121*  TRIG 91  CHOLHDL 4.8   Thyroid Function Tests No results for input(s): TSH, T4TOTAL, T3FREE, THYROIDAB in the last 72 hours.  Invalid input(s): FREET3  Telemetry    V paced, afib with CHB - Personally Reviewed  ECG    V paced, afib with CHB - Personally Reviewed  Radiology    Dg Chest Port 1 View  Result Date: 04/21/2016 CLINICAL DATA:  Shortness of breath. History of CHF, diabetes, coronary artery disease with MI. EXAM: PORTABLE CHEST 1 VIEW COMPARISON:  PA and lateral chest x-ray of April 20, 2016 FINDINGS: The lungs are well-expanded. The interstitial markings remain increased but have improved  somewhat. The cardiac silhouette is enlarged. The pulmonary vascularity is mildly prominent centrally. There is calcification in the wall of the aortic arch. The ICD is in stable position. The observed bony thorax is unremarkable. IMPRESSION: Slight interval improvement in the appearance of the pulmonary interstitium suggests decreased interstitial edema. Decreased right basilar subsegmental atelectasis. Persistent mild cardiomegaly. Thoracic aortic atherosclerosis. Electronically Signed   By: David  SwazilandJordan M.D.   On: 04/21/2016 10:21    Cardiac Studies   Echo 04/22/16:  - Left ventricle: The cavity size was normal. Systolic function wasmildly to moderately reduced. The estimated ejection fraction wasin the range of 40% to 45%. Akinesis of the inferolateralmyocardium; consistent with infarction in the distribution of theleft circumflex coronary artery. Doppler parameters areconsistent with restrictive physiology, indicative of decreasedleft ventricular diastolic compliance and/or increased left atrial pressure. - Ventricular septum: Septal motion showed paradox. These changes are consistent with right ventricular pacing. - Mitral valve: Calcified annulus. There was moderate regurgitation directed centrally. - Left atrium: The atrium was moderately to severely dilated. - Right ventricle: The cavity size was moderately dilated. Systolic function was mildly reduced. - Right atrium: The atrium was moderately to severely dilated. - Tricuspid valve: There was moderate regurgitation. - Pulmonary arteries: Systolic pressure was moderately increased. PA peak pressure: 65 mm Hg (S).  Cath 04/20/16: Diagnostic Diagram     Implants        No implant documentation for this case.     Hemo Data   Flowsheet Row Most Recent Value  AO Systolic Pressure 118 mmHg  AO Diastolic Pressure 57 mmHg  AO Mean 80 mmHg  LV Systolic Pressure 116 mmHg  LV Diastolic Pressure 15 mmHg  LV  EDP 25 mmHg  Arterial Occlusion Pressure Extended Systolic Pressure 115 mmHg  Arterial Occlusion Pressure Extended Diastolic Pressure 54 mmHg  Arterial Occlusion Pressure Extended Mean Pressure 78 mmHg  Left Ventricular Apex Extended Systolic Pressure 116 mmHg  Left Ventricular Apex Extended Diastolic Pressure 15 mmHg  Left Ventricular Apex Extended EDP Pressure 25 mmHg      Patient Profile     82 yo pacemaker dependent patient (s/p AV node ablation for permanent AFib) presents with CHF exacerbation and acute MI and has a high grade LCx stenosis as well as a moderate to severe mid LAD stenosis. After surgical consultation, felt to be best suited for PCI/stent LCX on Monday.  Assessment & Plan    1. CAD s/p lateral MI: on IV heparin. For PCI Monday 0900h Dr. Tresa EndoKelly, after hemodynamic optimization. 2. Acute  systolic (on chronic diastolic heart failure): still with evidence of elevated mean  LA pressure by echo yesterday. Net -700 mL yesterday. Hold diuresis. Stable creatinine, mildly elevated. 3. CHB: pacemaker dependent after AV node ablation. 4. AFib: permanent, will need continued anticoagulation - to be kept in mind when making stent choice. 5. PAH: appears chronic and disproportionate to the acuity of systolic left heart failure. We may be underestimating the degree of MR, she may have cardiac amyloidosis or there may be another reason for PAH. Reevaluate after PCI. 6. Pacemaker: normal function at last check 02/29/16.  Signed, Thurmon FairMihai Aaira Oestreicher, MD  04/23/2016, 9:36 AM

## 2016-04-24 NOTE — Progress Notes (Signed)
Patient Name: [name] Date of Encounter: 04/24/2016  [facility]  Hospital Problem List     Principal Problem:   NSTEMI (non-ST elevated myocardial infarction) [facility](HCC) Active Problems:   ATRIAL FIBRILLATION   PACEMAKER, PERMANENT   Abdominal pain   CHB (complete heart block) (HCC) post AVN ablation   PAH (pulmonary artery hypertension)   Subjective   Post cardiac cath today, chest pain free.  Inpatient Medications    Scheduled Meds: . aspirin EC  81 mg Oral Daily  . atorvastatin  40 mg Oral q1800  . atorvastatin  80 mg Oral q1800  . clopidogrel  75 mg Oral Daily  . ferrous sulfate  325 mg Oral Q breakfast  . insulin aspart  0-9 Units Subcutaneous TID WC  . levothyroxine  75 mcg Oral QAC breakfast  . mouth rinse  15 mL Mouth Rinse BID  . potassium chloride  10 mEq Oral Daily  . sodium chloride flush  3 mL Intravenous Q12H   Continuous Infusions: . sodium chloride    . nitroGLYCERIN 6 mcg/min (04/24/16 0900)   PRN Meds: sodium chloride, acetaminophen, diazepam, hydrALAZINE, hyoscyamine, labetalol, meclizine, nitroGLYCERIN, ondansetron (ZOFRAN) IV, polyethylene glycol, sodium chloride flush, traMADol   Vital Signs    Vitals:   04/24/16 1149 04/24/16 1154 04/24/16 1159 04/24/16 1230  BP: (!) 91/50 (!) 96/43 (!) 93/42 (!) 97/41  Pulse: 70 69 70 70  Resp: 15 12 15 20   Temp:    98.1 F (36.7 C)  TempSrc:    Oral  SpO2: 97% 97% 100% 96%  Weight:      Height:        Intake/Output Summary (Last 24 hours) at 04/24/16 1312 Last data filed at 04/24/16 1202  Gross per 24 hour  Intake          1693.62 ml  Output             1450 ml  Net           243.62 ml   Filed Weights   04/20/16 0347 04/23/16 1940  Weight: 141 lb (64 kg) 150 lb (68 kg)    Physical Exam  GEN: Well nourished, well developed, in no acute distress.  HEENT: Grossly normal.  Neck: Supple, no JVD, carotid bruits, or masses. Cardiac: RRR, split S2, 3/6 holosystolic LLSB  and apical murmur, no diastolic murmurs, rubs, or gallops. No clubbing, cyanosis, edema.  Radials/DP/PT 2+ and equal bilaterally.  Respiratory:  Respirations regular and unlabored, clear to auscultation bilaterally. GI: Soft, nontender, nondistended, BS + x 4. MS: no deformity or atrophy. Skin: warm and dry, no rash. Neuro:  Strength and sensation are intact. Psych: AAOx3.  Normal affect.  Labs    CBC  Recent Labs  04/23/16 0132 04/24/16 0216  WBC 6.6 5.9  HGB 10.2* 9.9*  HCT 31.0* 30.1*  MCV 99.0 99.0  PLT 93* 92*   Basic Metabolic Panel  Recent Labs  04/22/16 0355  NA 136  K 3.5  CL 99*  CO2 28  GLUCOSE 118*  BUN 30*  CREATININE 1.41*  CALCIUM 8.8*   Telemetry    V paced, afib with CHB - Personally Reviewed  ECG    V paced, afib with CHB - Personally Reviewed  Radiology    No results found.  Cardiac Studies   Echo 04/22/16:  - Left ventricle: The cavity size was normal. Systolic function wasmildly to moderately reduced. The estimated ejection fraction wasin the range of 40%  to 45%. Akinesis of the inferolateralmyocardium; consistent with infarction in the distribution of theleft circumflex coronary artery. Doppler parameters areconsistent with restrictive physiology, indicative of decreasedleft ventricular diastolic compliance and/or increased left atrial pressure. - Ventricular septum: Septal motion showed paradox. These changes are consistent with right ventricular pacing. - Mitral valve: Calcified annulus. There was moderate regurgitation directed centrally. - Left atrium: The atrium was moderately to severely dilated. - Right ventricle: The cavity size was moderately dilated. Systolic function was mildly reduced. - Right atrium: The atrium was moderately to severely dilated. - Tricuspid valve: There was moderate regurgitation. - Pulmonary arteries: Systolic pressure was moderately increased. PA peak pressure: 65 mm Hg  (S).  Cath 04/20/16: Diagnostic Diagram     Implants        No implant documentation for this case.     Hemo Data   Flowsheet Row Most Recent Value  AO Systolic Pressure 118 mmHg  AO Diastolic Pressure 57 mmHg  AO Mean 80 mmHg  LV Systolic Pressure 116 mmHg  LV Diastolic Pressure 15 mmHg  LV EDP 25 mmHg  Arterial Occlusion Pressure Extended Systolic Pressure 115 mmHg  Arterial Occlusion Pressure Extended Diastolic Pressure 54 mmHg  Arterial Occlusion Pressure Extended Mean Pressure 78 mmHg  Left Ventricular Apex Extended Systolic Pressure 116 mmHg  Left Ventricular Apex Extended Diastolic Pressure 15 mmHg  Left Ventricular Apex Extended EDP Pressure 25 mmHg      Patient Profile     82 yo pacemaker dependent patient (s/p AV node ablation for permanent AFib) presents with CHF exacerbation and acute MI and has a high grade LCx stenosis as well as a moderate to severe mid LAD stenosis. After surgical consultation, felt to be best suited for PCI/stent LCX on Monday.  Assessment & Plan    1. CAD s/p lateral MI: on IV heparin. The patient underwent a complex PCI to LCX/OM lesion with DES, great outcomes, she still has a tight lesion in proximal LAD, however we will plan for a stages procedure in few days as she has GFR 28 (irbesartan held), Hb 9.9, platelet 92.  We will schedule CBC and CMP for am.  2. Acute  systolic (on chronic diastolic heart failure): still with evidence of elevated mean LA pressure by echo, we will hold lasix today given hypotension and cath.   3. CHB: pacemaker dependent after AV node ablation. 4. AFib: permanent, will need continued anticoagulation - to be kept in mind when making stent choice. 5. PAH: appears chronic and disproportionate to the acuity of systolic left heart failure. We may be underestimating the degree of MR, she may have cardiac amyloidosis or there may be another reason for PAH. Reevaluate after PCI. 6. Pacemaker: normal  function at last check 02/29/16.  Signed, Tobias AlexanderKatarina Tanaya Dunigan, MD  04/24/2016, 1:12 PM

## 2016-04-24 NOTE — Progress Notes (Signed)
ANTICOAGULATION CONSULT NOTE - Follow-up Consult  Pharmacy Consult for Heparin Indication: NSTEMI  Allergies  Allergen Reactions  . Codeine Sulfate Shortness Of Breath and Other (See Comments)    Felt funny  . Simvastatin Other (See Comments)     choking, acid reflux    Patient Measurements: Height: 5\' 4"  (162.6 cm) Weight: 150 lb (68 kg) IBW/kg (Calculated) : 54.7 Heparin Dosing Weight: 64 kg  Vital Signs: Temp: 98.1 F (36.7 C) (12/04 1230) Temp Source: Oral (12/04 1230) BP: 97/49 (12/04 1300) Pulse Rate: 71 (12/04 1300)  Labs:  Recent Labs  04/22/16 0355 04/22/16 1452 04/23/16 0132 04/24/16 0216  HGB 10.6*  --  10.2* 9.9*  HCT 32.7*  --  31.0* 30.1*  PLT 92*  --  93* 92*  HEPARINUNFRC 0.18* 0.30 0.33 0.22*  CREATININE 1.41*  --   --   --     Estimated Creatinine Clearance: 26.6 mL/min (by C-G formula based on SCr of 1.41 mg/dL (H)).  Assessment: 82 y.o. F presents with NSTEMI. Noted pt on coumadin PTA but she is unsure if she was taking or not or when her last dose was. Admission INR 1.09 (subtherapeutic). Heparin restarted post cath 11/30 which showed severe 2V disease.   12/4 PM f/u s/p PCI: Spoke to Dr. Tresa EndoKelly.  Given decline in Hgb, will not resume IV heparin for now.  Goal of Therapy:  Heparin level 0.3-0.7 units/ml Monitor platelets by anticoagulation protocol: Yes   Plan:  1. F/u plans to resume heparin 12/5 if Hgb remains stable.  Tad MooreJessica Katrisha Segall, Pharm D, BCPS  Clinical Pharmacist Pager [phone]  04/24/2016 2:00 PM

## 2016-04-24 NOTE — Progress Notes (Signed)
Right femoral sheath removed per protocol.  Manual pressure held for 30 minutes.  Site dressed w/gauze and tegaderm.  No s/sx hematoma or bruising.  Pedal pulses +1.  Pt instructed to call for assist w/any signs of warmth, bleeding, and any activity needs.  Bed placed in low position w/call light available and Primary RN notified of completion of sheath pull.

## 2016-04-24 NOTE — Progress Notes (Signed)
ANTICOAGULATION CONSULT NOTE - Follow-up Consult  Pharmacy Consult for Heparin Indication: NSTEMI  Allergies  Allergen Reactions  . Codeine Sulfate Shortness Of Breath and Other (See Comments)    Felt funny  . Simvastatin Other (See Comments)     choking, acid reflux    Patient Measurements: Height: 5\' 4"  (162.6 cm) Weight: 141 lb (64 kg) IBW/kg (Calculated) : 54.7 Heparin Dosing Weight: 64 kg  Vital Signs: Temp: 98.2 F (36.8 C) (12/03 2000) Temp Source: Oral (12/03 2000) BP: 120/53 (12/04 0200) Pulse Rate: 70 (12/04 0200)  Labs:  Recent Labs  04/21/16 1154  04/22/16 0355 04/22/16 1452 04/23/16 0132 04/24/16 0216  HGB  --   < > 10.6*  --  10.2* 9.9*  HCT  --   --  32.7*  --  31.0* 30.1*  PLT  --   --  92*  --  93* 92*  HEPARINUNFRC  --   --  0.18* 0.30 0.33 0.22*  CREATININE 1.39*  --  1.41*  --   --   --   TROPONINI 23.46*  --   --   --   --   --   < > = values in this interval not displayed.  Estimated Creatinine Clearance: 24.3 mL/min (by C-G formula based on SCr of 1.41 mg/dL (H)).  Assessment: 82 y.o. F presents with NSTEMI. Noted pt on coumadin PTA but she is unsure if she was taking or not or when her last dose was. Admission INR 1.09 (subtherapeutic). Heparin restarted post cath 11/30 which showed severe 2V disease. Heparin level down to subtherapeutic (0.22). No issues with line or bleeding reported per RN. Hgb remains low but stable, plt low but stable as well.   Pt for PCI today at 0900.  Goal of Therapy:  Heparin level 0.3-0.7 units/ml Monitor platelets by anticoagulation protocol: Yes   Plan:  Increase heparin gtt to 1250 units/hr F/u post PCI  Christoper Fabianaron Nari Vannatter, PharmD, BCPS Clinical pharmacist, pager [phone] 04/24/2016 3:07 AM

## 2016-04-24 NOTE — Interval H&P Note (Signed)
Cath Lab Visit (complete for each Cath Lab visit)  Clinical Evaluation Leading to the Procedure:   ACS: Yes.    Non-ACS:    Anginal Classification: CCS IV  Anti-ischemic medical therapy: Maximal Therapy (2 or more classes of medications)  Non-Invasive Test Results: No non-invasive testing performed  Prior CABG: No previous CABG      History and Physical Interval Note:  04/24/2016 9:51 AM  [name]  has presented today for surgery, with the diagnosis of cad  The various methods of treatment have been discussed with the patient and family. After consideration of risks, benefits and other options for treatment, the patient has consented to  Procedure(s): Coronary Stent Intervention (N/A) as a surgical intervention .  The patient's history has been reviewed, patient examined, no change in status, stable for surgery.  I have reviewed the patient's chart and labs.  Questions were answered to the patient's satisfaction.     Nicki Guadalajarahomas Burnice Oestreicher

## 2016-04-25 ENCOUNTER — Encounter (HOSPITAL_COMMUNITY): Payer: Self-pay | Admitting: Cardiovascular Disease

## 2016-04-25 DIAGNOSIS — I481 Persistent atrial fibrillation: Secondary | ICD-10-CM

## 2016-04-25 DIAGNOSIS — I25700 Atherosclerosis of coronary artery bypass graft(s), unspecified, with unstable angina pectoris: Secondary | ICD-10-CM

## 2016-04-25 LAB — CBC
HCT: 31.8 % — ABNORMAL LOW (ref 36.0–46.0)
Hemoglobin: 10.2 g/dL — ABNORMAL LOW (ref 12.0–15.0)
MCH: 32.2 pg (ref 26.0–34.0)
MCHC: 32.1 g/dL (ref 30.0–36.0)
MCV: 100.3 fL — ABNORMAL HIGH (ref 78.0–100.0)
Platelets: 101 10*3/uL — ABNORMAL LOW (ref 150–400)
RBC: 3.17 MIL/uL — ABNORMAL LOW (ref 3.87–5.11)
RDW: 13.8 % (ref 11.5–15.5)
WBC: 8 10*3/uL (ref 4.0–10.5)

## 2016-04-25 LAB — BASIC METABOLIC PANEL
ANION GAP: 12 (ref 5–15)
BUN: 26 mg/dL — ABNORMAL HIGH (ref 6–20)
CALCIUM: 8.9 mg/dL (ref 8.9–10.3)
CO2: 20 mmol/L — ABNORMAL LOW (ref 22–32)
Chloride: 105 mmol/L (ref 101–111)
Creatinine, Ser: 1.05 mg/dL — ABNORMAL HIGH (ref 0.44–1.00)
GFR, EST AFRICAN AMERICAN: 54 mL/min — AB (ref >60)
GFR, EST NON AFRICAN AMERICAN: 46 mL/min — AB (ref >60)
Glucose, Bld: 145 mg/dL — ABNORMAL HIGH (ref 65–99)
POTASSIUM: 4.4 mmol/L (ref 3.5–5.1)
Sodium: 137 mmol/L (ref 135–145)

## 2016-04-25 LAB — GLUCOSE, CAPILLARY
GLUCOSE-CAPILLARY: 158 mg/dL — AB (ref 65–99)
GLUCOSE-CAPILLARY: 146 mg/dL — AB (ref 65–99)
Glucose-Capillary: 152 mg/dL — ABNORMAL HIGH (ref 65–99)
Glucose-Capillary: 167 mg/dL — ABNORMAL HIGH (ref 65–99)

## 2016-04-25 LAB — CULTURE, BLOOD (ROUTINE X 2)
CULTURE: NO GROWTH
Culture: NO GROWTH

## 2016-04-25 MED ORDER — SODIUM CHLORIDE 0.9% FLUSH: 3.0000 mL | INTRAVENOUS | Status: DC | PRN

## 2016-04-25 MED ORDER — SODIUM CHLORIDE 0.9 % IV SOLN: 250.0000 mL | INTRAVENOUS | Status: DC | PRN

## 2016-04-25 MED ORDER — SODIUM CHLORIDE 0.9% FLUSH
3.0000 mL | Freq: Two times a day (BID) | INTRAVENOUS | Status: DC
  Administered 2016-04-25: 20 mL via INTRAVENOUS

## 2016-04-25 MED ORDER — SODIUM CHLORIDE 0.9 % IV SOLN
INTRAVENOUS | Status: DC
  Administered 2016-04-26: 05:00:00 via INTRAVENOUS
  Administered 2016-04-26: 250 mL via INTRAVENOUS
  Administered 2016-04-26: 06:00:00 via INTRAVENOUS

## 2016-04-25 NOTE — Progress Notes (Signed)
Patient Name: Maria BrunsMaxine F Mccutchan Date of Encounter: 04/25/2016  [facility]  Hospital Problem List     Principal Problem:   NSTEMI (non-ST elevated myocardial infarction) [facility](HCC) Active Problems:   ATRIAL FIBRILLATION   PACEMAKER, PERMANENT   Abdominal pain   CHB (complete heart block) (HCC) post AVN ablation   PAH (pulmonary artery hypertension)   Subjective   Post cardiac cath yesterday, chest pain free.  Inpatient Medications    Scheduled Meds: . aspirin EC  81 mg Oral Daily  . atorvastatin  80 mg Oral q1800  . clopidogrel  75 mg Oral Daily  . ferrous sulfate  325 mg Oral Q breakfast  . insulin aspart  0-9 Units Subcutaneous TID WC  . levothyroxine  75 mcg Oral QAC breakfast  . mouth rinse  15 mL Mouth Rinse BID  . potassium chloride  10 mEq Oral Daily  . sodium chloride flush  3 mL Intravenous Q12H   Continuous Infusions: . sodium chloride 100 mL/hr at 04/25/16 0600  . nitroGLYCERIN 6 mcg/min (04/25/16 0600)   PRN Meds: sodium chloride, acetaminophen, diazepam, hyoscyamine, meclizine, nitroGLYCERIN, ondansetron (ZOFRAN) IV, polyethylene glycol, sodium chloride flush, traMADol   Vital Signs    Vitals:   04/25/16 0001 04/25/16 0423 04/25/16 0800 04/25/16 0802  BP: 116/68 120/63 (!) 108/51 (!) 108/51  Pulse: 71 71 70 71  Resp: 20 (!) 33 (!) 34 (!) 34  Temp: 97.6 F (36.4 C) 97.7 F (36.5 C)  97.5 F (36.4 C)  TempSrc: Oral Oral  Oral  SpO2: 94% 96% 94% 93%  Weight:      Height:        Intake/Output Summary (Last 24 hours) at 04/25/16 0908 Last data filed at 04/25/16 0600  Gross per 24 hour  Intake          2343.22 ml  Output              550 ml  Net          1793.22 ml   Filed Weights   04/20/16 0347 04/23/16 1940  Weight: 141 lb (64 kg) 150 lb (68 kg)    Physical Exam  GEN: Well nourished, well developed, in no acute distress.  HEENT: Grossly normal.  Neck: Supple, no JVD, carotid bruits, or masses. Cardiac: RRR, split S2, 3/6  holosystolic LLSB and apical murmur, no diastolic murmurs, rubs, or gallops. No clubbing, cyanosis, edema.  Radials/DP/PT 2+ and equal bilaterally.  Respiratory:  Respirations regular and unlabored, clear to auscultation bilaterally. GI: Soft, nontender, nondistended, BS + x 4. MS: no deformity or atrophy. Skin: warm and dry, no rash. Neuro:  Strength and sensation are intact. Psych: AAOx3.  Normal affect.  Labs    CBC  Recent Labs  04/24/16 0216 04/25/16 0219  WBC 5.9 8.0  HGB 9.9* 10.2*  HCT 30.1* 31.8*  MCV 99.0 100.3*  PLT 92* 101*   Basic Metabolic Panel  Recent Labs  04/25/16 0219  NA 137  K 4.4  CL 105  CO2 20*  GLUCOSE 145*  BUN 26*  CREATININE 1.05*  CALCIUM 8.9   Telemetry    V paced, afib with CHB - Personally Reviewed  ECG    V paced, afib with CHB - Personally Reviewed  Radiology    No results found.  Cardiac Studies   Echo 04/22/16:  - Left ventricle: The cavity size was normal. Systolic function wasmildly to moderately reduced. The estimated ejection fraction wasin the range of 40%  to 45%. Akinesis of the inferolateralmyocardium; consistent with infarction in the distribution of theleft circumflex coronary artery. Doppler parameters areconsistent with restrictive physiology, indicative of decreasedleft ventricular diastolic compliance and/or increased left atrial pressure. - Ventricular septum: Septal motion showed paradox. These changes are consistent with right ventricular pacing. - Mitral valve: Calcified annulus. There was moderate regurgitation directed centrally. - Left atrium: The atrium was moderately to severely dilated. - Right ventricle: The cavity size was moderately dilated. Systolic function was mildly reduced. - Right atrium: The atrium was moderately to severely dilated. - Tricuspid valve: There was moderate regurgitation. - Pulmonary arteries: Systolic pressure was moderately increased. PA peak pressure:  65 mm Hg (S).  Cath 04/20/16: Diagnostic Diagram     Implants        No implant documentation for this case.     Hemo Data   Flowsheet Row Most Recent Value  AO Systolic Pressure 118 mmHg  AO Diastolic Pressure 57 mmHg  AO Mean 80 mmHg  LV Systolic Pressure 116 mmHg  LV Diastolic Pressure 15 mmHg  LV EDP 25 mmHg  Arterial Occlusion Pressure Extended Systolic Pressure 115 mmHg  Arterial Occlusion Pressure Extended Diastolic Pressure 54 mmHg  Arterial Occlusion Pressure Extended Mean Pressure 78 mmHg  Left Ventricular Apex Extended Systolic Pressure 116 mmHg  Left Ventricular Apex Extended Diastolic Pressure 15 mmHg  Left Ventricular Apex Extended EDP Pressure 25 mmHg      Patient Profile     82 yo pacemaker dependent patient (s/p AV node ablation for permanent AFib) presents with CHF exacerbation and acute MI and has a high grade LCx stenosis as well as a moderate to severe mid LAD stenosis. After surgical consultation, felt to be best suited for PCI/stent LCX on Monday.  Assessment & Plan    1. CAD s/p lateral MI: on IV heparin. The patient underwent a complex PCI to LCX/OM lesion with DES, great outcomes, she still has a tight lesion in proximal LAD, however we will plan for a stages procedure in few days as she had GFR 28 (irbesartan held), Hb 9.9, platelet 92.  HB 10.2 this am, Crea 1.0, GFR 46. Possible staged cath tomorrow.  2. Acute  systolic (on chronic diastolic heart failure): still with evidence of elevated mean LA pressure by echo, we will give lasix 40 mg iv x 1 today.  3. CHB: pacemaker dependent after AV node ablation. 4. AFib: permanent, will need continued anticoagulation - to be kept in mind when making stent choice. 5. PAH: appears chronic and disproportionate to the acuity of systolic left heart failure. We may be underestimating the degree of MR, she may have cardiac amyloidosis or there may be another reason for PAH. Reevaluate after PCI. 6.  Pacemaker: normal function at last check 02/29/16.  Signed, Tobias AlexanderKatarina Amarah Brossman, MD  04/25/2016, 9:08 AM

## 2016-04-26 ENCOUNTER — Encounter (HOSPITAL_COMMUNITY): Payer: Self-pay | Admitting: Cardiovascular Disease

## 2016-04-26 ENCOUNTER — Encounter (HOSPITAL_COMMUNITY)
Admission: EM | Disposition: A | Discharge status: Skilled Nursing Facility | Payer: Self-pay | Source: Home / Self Care | Attending: Interventional Cardiology

## 2016-04-26 HISTORY — PX: CARDIAC CATHETERIZATION: SHX172

## 2016-04-26 LAB — BASIC METABOLIC PANEL
Anion gap: 9 (ref 5–15)
BUN: 32 mg/dL — ABNORMAL HIGH (ref 6–20)
CO2: 23 mmol/L (ref 22–32)
Calcium: 8.9 mg/dL (ref 8.9–10.3)
Chloride: 103 mmol/L (ref 101–111)
Creatinine, Ser: 1.17 mg/dL — ABNORMAL HIGH (ref 0.44–1.00)
GFR calc Af Amer: 47 mL/min — ABNORMAL LOW (ref >60)
GFR calc non Af Amer: 41 mL/min — ABNORMAL LOW (ref >60)
Glucose, Bld: 173 mg/dL — ABNORMAL HIGH (ref 65–99)
Potassium: 4.4 mmol/L (ref 3.5–5.1)
Sodium: 135 mmol/L (ref 135–145)

## 2016-04-26 LAB — CBC
HCT: 30.1 % — ABNORMAL LOW (ref 36.0–46.0)
Hemoglobin: 9.7 g/dL — ABNORMAL LOW (ref 12.0–15.0)
MCH: 32.4 pg (ref 26.0–34.0)
MCHC: 32.2 g/dL (ref 30.0–36.0)
MCV: 100.7 fL — ABNORMAL HIGH (ref 78.0–100.0)
Platelets: 98 10*3/uL — ABNORMAL LOW (ref 150–400)
RBC: 2.99 MIL/uL — ABNORMAL LOW (ref 3.87–5.11)
RDW: 14 % (ref 11.5–15.5)
WBC: 8.9 10*3/uL (ref 4.0–10.5)

## 2016-04-26 LAB — INFLUENZA PANEL BY PCR (TYPE A & B)
Influenza A By PCR: NEGATIVE
Influenza B By PCR: NEGATIVE

## 2016-04-26 LAB — GLUCOSE, CAPILLARY
GLUCOSE-CAPILLARY: 123 mg/dL — AB (ref 65–99)
Glucose-Capillary: 150 mg/dL — ABNORMAL HIGH (ref 65–99)
Glucose-Capillary: 151 mg/dL — ABNORMAL HIGH (ref 65–99)
Glucose-Capillary: 101 mg/dL — ABNORMAL HIGH (ref 65–99)

## 2016-04-26 LAB — POCT ACTIVATED CLOTTING TIME: Activated Clotting Time: 499 seconds

## 2016-04-26 SURGERY — CORONARY STENT INTERVENTION

## 2016-04-26 MED ORDER — CLOPIDOGREL BISULFATE 75 MG PO TABS
ORAL_TABLET | ORAL | Status: AC
  Filled 2016-04-26: qty 1

## 2016-04-26 MED ORDER — HYDRALAZINE HCL 20 MG/ML IJ SOLN: 5.0000 mg | INTRAMUSCULAR | Status: AC | PRN

## 2016-04-26 MED ORDER — SODIUM CHLORIDE 0.9% FLUSH: 3.0000 mL | INTRAVENOUS | Status: DC | PRN

## 2016-04-26 MED ORDER — SODIUM CHLORIDE 0.9% FLUSH
3.0000 mL | Freq: Two times a day (BID) | INTRAVENOUS | Status: DC
  Administered 2016-04-27 – 2016-04-30 (×8): 3 mL via INTRAVENOUS

## 2016-04-26 MED ORDER — IOPAMIDOL (ISOVUE-370) INJECTION 76%
INTRAVENOUS | Status: DC | PRN
  Administered 2016-04-26: 95 mL via INTRA_ARTERIAL

## 2016-04-26 MED ORDER — HEPARIN (PORCINE) IN NACL 2-0.9 UNIT/ML-% IJ SOLN
INTRAMUSCULAR | Status: DC | PRN
  Administered 2016-04-26: 1000 mL

## 2016-04-26 MED ORDER — IOPAMIDOL (ISOVUE-370) INJECTION 76%
INTRAVENOUS | Status: AC
  Filled 2016-04-26: qty 125

## 2016-04-26 MED ORDER — BIVALIRUDIN 250 MG IV SOLR
INTRAVENOUS | Status: AC
  Filled 2016-04-26: qty 250

## 2016-04-26 MED ORDER — HEPARIN (PORCINE) IN NACL 2-0.9 UNIT/ML-% IJ SOLN
INTRAMUSCULAR | Status: AC
  Filled 2016-04-26: qty 1000

## 2016-04-26 MED ORDER — SODIUM CHLORIDE 0.9 % IV SOLN
INTRAVENOUS | Status: DC | PRN
  Administered 2016-04-26: 1.75 mg/kg/h via INTRAVENOUS

## 2016-04-26 MED ORDER — NITROGLYCERIN 1 MG/10 ML FOR IR/CATH LAB
INTRA_ARTERIAL | Status: DC | PRN
Start: 2016-04-26 — End: 2016-04-26
  Administered 2016-04-26: 100 ug via INTRACORONARY

## 2016-04-26 MED ORDER — BIVALIRUDIN BOLUS VIA INFUSION - CUPID
INTRAVENOUS | Status: DC | PRN
  Administered 2016-04-26: 53.625 mg via INTRAVENOUS

## 2016-04-26 MED ORDER — VERAPAMIL HCL 2.5 MG/ML IV SOLN
INTRAVENOUS | Status: AC
  Filled 2016-04-26: qty 2

## 2016-04-26 MED ORDER — NITROGLYCERIN 1 MG/10 ML FOR IR/CATH LAB
INTRA_ARTERIAL | Status: AC
  Filled 2016-04-26: qty 10

## 2016-04-26 MED ORDER — CLOPIDOGREL BISULFATE 75 MG PO TABS
ORAL_TABLET | ORAL | Status: DC | PRN
  Administered 2016-04-26: 75 mg via ORAL

## 2016-04-26 MED ORDER — SODIUM CHLORIDE 0.9 % WEIGHT BASED INFUSION
1.0000 mL/kg/h | INTRAVENOUS | Status: AC
  Administered 2016-04-26: 1 mL/kg/h via INTRAVENOUS

## 2016-04-26 MED ORDER — SODIUM CHLORIDE 0.9 % IV SOLN: 250.0000 mL | INTRAVENOUS | Status: DC | PRN

## 2016-04-26 MED ORDER — LABETALOL HCL 5 MG/ML IV SOLN: 10.0000 mg | INTRAVENOUS | Status: AC | PRN

## 2016-04-26 MED ORDER — LIDOCAINE HCL (PF) 1 % IJ SOLN
INTRAMUSCULAR | Status: AC
  Filled 2016-04-26: qty 30

## 2016-04-26 MED ORDER — LIDOCAINE HCL (PF) 1 % IJ SOLN
INTRAMUSCULAR | Status: DC | PRN
  Administered 2016-04-26: 2 mL

## 2016-04-26 MED ORDER — VERAPAMIL HCL 2.5 MG/ML IV SOLN
INTRAVENOUS | Status: DC | PRN
  Administered 2016-04-26: 10 mL via INTRA_ARTERIAL

## 2016-04-26 SURGICAL SUPPLY — 17 items
BALLN MOZEC 2.50X20 (BALLOONS) ×2
BALLN ~~LOC~~ MOZEC 2.5X23 (BALLOONS) ×2
BALLOON MOZEC 2.50X20 (BALLOONS) IMPLANT
BALLOON ~~LOC~~ MOZEC 2.5X23 (BALLOONS) IMPLANT
CATH VISTA GUIDE 6FR XBLAD3.5 (CATHETERS) ×2 IMPLANT
DEVICE RAD COMP TR BAND LRG (VASCULAR PRODUCTS) ×4 IMPLANT
GLIDESHEATH SLEND SS 6F .021 (SHEATH) ×2 IMPLANT
GUIDEWIRE INQWIRE 1.5J.035X260 (WIRE) IMPLANT
INQWIRE 1.5J .035X260CM (WIRE) ×3
KIT ENCORE 26 ADVANTAGE (KITS) ×2 IMPLANT
KIT HEART LEFT (KITS) ×3 IMPLANT
PACK CARDIAC CATHETERIZATION (CUSTOM PROCEDURE TRAY) ×3 IMPLANT
STENT RESOLUTE ONYX 2.5X30 (Permanent Stent) ×2 IMPLANT
TRANSDUCER W/STOPCOCK (MISCELLANEOUS) ×3 IMPLANT
TUBING CIL FLEX 10 FLL-RA (TUBING) ×3 IMPLANT
WIRE HI TORQ VERSACORE-J 145CM (WIRE) ×2 IMPLANT
WIRE RUNTHROUGH .014X180CM (WIRE) ×2 IMPLANT

## 2016-04-26 NOTE — Progress Notes (Signed)
Patient Name: [name] Date of Encounter: 04/26/2016  [facility]  Hospital Problem List     Principal Problem:   NSTEMI (non-ST elevated myocardial infarction) [facility](HCC) Active Problems:   ATRIAL FIBRILLATION   PACEMAKER, PERMANENT   Abdominal pain   CHB (complete heart block) (HCC) post AVN ablation   PAH (pulmonary artery hypertension)   Subjective   The patient denies CP or SOB.  Inpatient Medications    Scheduled Meds: . [MAR Hold] aspirin EC  81 mg Oral Daily  . [MAR Hold] atorvastatin  80 mg Oral q1800  . [MAR Hold] clopidogrel  75 mg Oral Daily  . [MAR Hold] ferrous sulfate  325 mg Oral Q breakfast  . [MAR Hold] insulin aspart  0-9 Units Subcutaneous TID WC  . [MAR Hold] levothyroxine  75 mcg Oral QAC breakfast  . [MAR Hold] mouth rinse  15 mL Mouth Rinse BID  . [MAR Hold] potassium chloride  10 mEq Oral Daily  . [MAR Hold] sodium chloride flush  3 mL Intravenous Q12H  . sodium chloride flush  3 mL Intravenous Q12H   Continuous Infusions: . sodium chloride 10 mL/hr at 04/26/16 0535  . sodium chloride 250 mL (04/26/16 0912)  . bivalirudin (ANGIOMAX) infusion 5 mg/mL Stopped (04/26/16 0927)  . heparin    . [MAR Hold] nitroGLYCERIN Stopped (04/25/16 1130)   PRN Meds: [MAR Hold] sodium chloride, sodium chloride, [MAR Hold] acetaminophen, bivalirudin (ANGIOMAX) infusion 5 mg/mL, bivalirudin, [MAR Hold] diazepam, heparin, [MAR Hold] hyoscyamine, iopamidol, lidocaine (PF), [MAR Hold] meclizine, [MAR Hold] nitroGLYCERIN, nitroGLYCERIN, [MAR Hold] ondansetron (ZOFRAN) IV, [MAR Hold] polyethylene glycol, Radial Cocktail/Verapamil only, [MAR Hold] sodium chloride flush, sodium chloride flush, [MAR Hold] traMADol   Vital Signs    Vitals:   04/26/16 0914 04/26/16 0918 04/26/16 0923 04/26/16 0928  BP: 123/64 113/61 123/65 122/65  Pulse: 71 70 71 72  Resp: (!) 25 (!) 24 (!) 23 (!) 24  Temp:      TempSrc:      SpO2: 92% 94% 93% 95%  Weight:        Height:        Intake/Output Summary (Last 24 hours) at 04/26/16 0937 Last data filed at 04/26/16 0600  Gross per 24 hour  Intake          1967.08 ml  Output              175 ml  Net          1792.08 ml   Filed Weights   04/20/16 0347 04/23/16 1940 04/25/16 2020  Weight: 141 lb (64 kg) 150 lb (68 kg) 157 lb 11.2 oz (71.5 kg)    Physical Exam  GEN: Well nourished, well developed, in no acute distress.  HEENT: Grossly normal.  Neck: Supple, no JVD, carotid bruits, or masses. Cardiac: RRR, split S2, 3/6 holosystolic LLSB and apical murmur, no diastolic murmurs, rubs, or gallops. No clubbing, cyanosis, edema.  Radials/DP/PT 2+ and equal bilaterally.  Respiratory:  Respirations regular and unlabored, clear to auscultation bilaterally. GI: Soft, nontender, nondistended, BS + x 4. MS: no deformity or atrophy. Skin: warm and dry, no rash. Neuro:  Strength and sensation are intact. Psych: AAOx3.  Normal affect.  Labs    CBC  Recent Labs  04/25/16 0219 04/26/16 0232  WBC 8.0 8.9  HGB 10.2* 9.7*  HCT 31.8* 30.1*  MCV 100.3* 100.7*  PLT 101* 98*   Basic Metabolic Panel  Recent Labs  04/25/16 0219 04/26/16  0232  NA 137 135  K 4.4 4.4  CL 105 103  CO2 20* 23  GLUCOSE 145* 173*  BUN 26* 32*  CREATININE 1.05* 1.17*  CALCIUM 8.9 8.9   Telemetry    V paced, afib with CHB - Personally Reviewed  ECG    V paced, afib with CHB - Personally Reviewed  Radiology    No results found.  Cardiac Studies   Echo 04/22/16:  - Left ventricle: The cavity size was normal. Systolic function wasmildly to moderately reduced. The estimated ejection fraction wasin the range of 40% to 45%. Akinesis of the inferolateralmyocardium; consistent with infarction in the distribution of theleft circumflex coronary artery. Doppler parameters areconsistent with restrictive physiology, indicative of decreasedleft ventricular diastolic compliance and/or increased left atrial  pressure. - Ventricular septum: Septal motion showed paradox. These changes are consistent with right ventricular pacing. - Mitral valve: Calcified annulus. There was moderate regurgitation directed centrally. - Left atrium: The atrium was moderately to severely dilated. - Right ventricle: The cavity size was moderately dilated. Systolic function was mildly reduced. - Right atrium: The atrium was moderately to severely dilated. - Tricuspid valve: There was moderate regurgitation. - Pulmonary arteries: Systolic pressure was moderately increased. PA peak pressure: 65 mm Hg (S).  Cath 04/20/16: Diagnostic Diagram     Implants        No implant documentation for this case.     Hemo Data   Flowsheet Row Most Recent Value  AO Systolic Pressure 118 mmHg  AO Diastolic Pressure 57 mmHg  AO Mean 80 mmHg  LV Systolic Pressure 116 mmHg  LV Diastolic Pressure 15 mmHg  LV EDP 25 mmHg  Arterial Occlusion Pressure Extended Systolic Pressure 115 mmHg  Arterial Occlusion Pressure Extended Diastolic Pressure 54 mmHg  Arterial Occlusion Pressure Extended Mean Pressure 78 mmHg  Left Ventricular Apex Extended Systolic Pressure 116 mmHg  Left Ventricular Apex Extended Diastolic Pressure 15 mmHg  Left Ventricular Apex Extended EDP Pressure 25 mmHg      Patient Profile     82 yo pacemaker dependent patient (s/p AV node ablation for permanent AFib) presents with CHF exacerbation and acute MI and has a high grade LCx stenosis as well as a moderate to severe mid LAD stenosis. After surgical consultation, felt to be best suited for PCI/stent LCX on Monday.  Assessment & Plan    1. CAD s/p lateral MI: on IV heparin. The patient underwent a complex PCI to LCX/OM lesion with DES, great outcomes, she still has a tight lesion in proximal LAD, plan to cath today as a stages procedure, Crea today 1.17, Hb 9.7  2. Acute  systolic (on chronic diastolic heart failure): still with evidence  of elevated mean LA pressure by echo, appears euvolemic, we will reassess after the cath.  3. CHB: pacemaker dependent after AV node ablation. 4. AFib: permanent, will need continued anticoagulation - to be kept in mind when making stent choice. 5. PAH: appears chronic and disproportionate to the acuity of systolic left heart failure. We may be underestimating the degree of MR, she may have cardiac amyloidosis or there may be another reason for PAH. Reevaluate after PCI. 6. Pacemaker: normal function at last check 02/29/16.  Signed, Tobias AlexanderKatarina Adanya Sosinski, MD  04/26/2016, 9:37 AM

## 2016-04-26 NOTE — H&P (View-Only) (Signed)
 Patient Name: [name] Date of Encounter: 04/25/2016  Primary Cardiologist: [facility]  Hospital Problem List     Principal Problem:   NSTEMI (non-ST elevated myocardial infarction) (HCC) Active Problems:   ATRIAL FIBRILLATION   PACEMAKER, PERMANENT   Abdominal pain   CHB (complete heart block) (HCC) post AVN ablation   PAH (pulmonary artery hypertension)   Subjective   Post cardiac cath yesterday, chest pain free.  Inpatient Medications    Scheduled Meds: . aspirin EC  81 mg Oral Daily  . atorvastatin  80 mg Oral q1800  . clopidogrel  75 mg Oral Daily  . ferrous sulfate  325 mg Oral Q breakfast  . insulin aspart  0-9 Units Subcutaneous TID WC  . levothyroxine  75 mcg Oral QAC breakfast  . mouth rinse  15 mL Mouth Rinse BID  . potassium chloride  10 mEq Oral Daily  . sodium chloride flush  3 mL Intravenous Q12H   Continuous Infusions: . sodium chloride 100 mL/hr at 04/25/16 0600  . nitroGLYCERIN 6 mcg/min (04/25/16 0600)   PRN Meds: sodium chloride, acetaminophen, diazepam, hyoscyamine, meclizine, nitroGLYCERIN, ondansetron (ZOFRAN) IV, polyethylene glycol, sodium chloride flush, traMADol   Vital Signs    Vitals:   04/25/16 0001 04/25/16 0423 04/25/16 0800 04/25/16 0802  BP: 116/68 120/63 (!) 108/51 (!) 108/51  Pulse: 71 71 70 71  Resp: 20 (!) 33 (!) 34 (!) 34  Temp: 97.6 F (36.4 C) 97.7 F (36.5 C)  97.5 F (36.4 C)  TempSrc: Oral Oral  Oral  SpO2: 94% 96% 94% 93%  Weight:      Height:        Intake/Output Summary (Last 24 hours) at 04/25/16 0908 Last data filed at 04/25/16 0600  Gross per 24 hour  Intake          2343.22 ml  Output              550 ml  Net          1793.22 ml   Filed Weights   04/20/16 0347 04/23/16 1940  Weight: 141 lb (64 kg) 150 lb (68 kg)    Physical Exam  GEN: Well nourished, well developed, in no acute distress.  HEENT: Grossly normal.  Neck: Supple, no JVD, carotid bruits, or masses. Cardiac: RRR, split S2, 3/6  holosystolic LLSB and apical murmur, no diastolic murmurs, rubs, or gallops. No clubbing, cyanosis, edema.  Radials/DP/PT 2+ and equal bilaterally.  Respiratory:  Respirations regular and unlabored, clear to auscultation bilaterally. GI: Soft, nontender, nondistended, BS + x 4. MS: no deformity or atrophy. Skin: warm and dry, no rash. Neuro:  Strength and sensation are intact. Psych: AAOx3.  Normal affect.  Labs    CBC  Recent Labs  04/24/16 0216 04/25/16 0219  WBC 5.9 8.0  HGB 9.9* 10.2*  HCT 30.1* 31.8*  MCV 99.0 100.3*  PLT 92* 101*   Basic Metabolic Panel  Recent Labs  04/25/16 0219  NA 137  K 4.4  CL 105  CO2 20*  GLUCOSE 145*  BUN 26*  CREATININE 1.05*  CALCIUM 8.9   Telemetry    V paced, afib with CHB - Personally Reviewed  ECG    V paced, afib with CHB - Personally Reviewed  Radiology    No results found.  Cardiac Studies   Echo 04/22/16:  - Left ventricle: The cavity size was normal. Systolic function wasmildly to moderately reduced. The estimated ejection fraction wasin the range of 40%   to 45%. Akinesis of the inferolateralmyocardium; consistent with infarction in the distribution of theleft circumflex coronary artery. Doppler parameters areconsistent with restrictive physiology, indicative of decreasedleft ventricular diastolic compliance and/or increased left atrial pressure. - Ventricular septum: Septal motion showed paradox. These changes are consistent with right ventricular pacing. - Mitral valve: Calcified annulus. There was moderate regurgitation directed centrally. - Left atrium: The atrium was moderately to severely dilated. - Right ventricle: The cavity size was moderately dilated. Systolic function was mildly reduced. - Right atrium: The atrium was moderately to severely dilated. - Tricuspid valve: There was moderate regurgitation. - Pulmonary arteries: Systolic pressure was moderately increased. PA peak pressure:  65 mm Hg (S).  Cath 04/20/16: Diagnostic Diagram     Implants        No implant documentation for this case.     Hemo Data   Flowsheet Row Most Recent Value  AO Systolic Pressure 118 mmHg  AO Diastolic Pressure 57 mmHg  AO Mean 80 mmHg  LV Systolic Pressure 116 mmHg  LV Diastolic Pressure 15 mmHg  LV EDP 25 mmHg  Arterial Occlusion Pressure Extended Systolic Pressure 115 mmHg  Arterial Occlusion Pressure Extended Diastolic Pressure 54 mmHg  Arterial Occlusion Pressure Extended Mean Pressure 78 mmHg  Left Ventricular Apex Extended Systolic Pressure 116 mmHg  Left Ventricular Apex Extended Diastolic Pressure 15 mmHg  Left Ventricular Apex Extended EDP Pressure 25 mmHg      Patient Profile     82 yo pacemaker dependent patient (s/p AV node ablation for permanent AFib) presents with CHF exacerbation and acute MI and has a high grade LCx stenosis as well as a moderate to severe mid LAD stenosis. After surgical consultation, felt to be best suited for PCI/stent LCX on Monday.  Assessment & Plan    1. CAD s/p lateral MI: on IV heparin. The patient underwent a complex PCI to LCX/OM lesion with DES, great outcomes, she still has a tight lesion in proximal LAD, however we will plan for a stages procedure in few days as she had GFR 28 (irbesartan held), Hb 9.9, platelet 92.  HB 10.2 this am, Crea 1.0, GFR 46. Possible staged cath tomorrow.  2. Acute  systolic (on chronic diastolic heart failure): still with evidence of elevated mean LA pressure by echo, we will give lasix 40 mg iv x 1 today.  3. CHB: pacemaker dependent after AV node ablation. 4. AFib: permanent, will need continued anticoagulation - to be kept in mind when making stent choice. 5. PAH: appears chronic and disproportionate to the acuity of systolic left heart failure. We may be underestimating the degree of MR, she may have cardiac amyloidosis or there may be another reason for PAH. Reevaluate after PCI. 6.  Pacemaker: normal function at last check 02/29/16.  Signed, Tobias AlexanderKatarina Shakerria Parran, MD  04/25/2016, 9:08 AM

## 2016-04-26 NOTE — Interval H&P Note (Signed)
Cath Lab Visit (complete for each Cath Lab visit)  Clinical Evaluation Leading to the Procedure:   ACS: Yes.    Non-ACS:    Anginal Classification: CCS IV  Anti-ischemic medical therapy: Minimal Therapy (1 class of medications)  Non-Invasive Test Results: No non-invasive testing performed  Prior CABG: No previous CABG      History and Physical Interval Note:  04/26/2016 8:43 AM  [name]  has presented today for surgery, with the diagnosis of cad  The various methods of treatment have been discussed with the patient and family. After consideration of risks, benefits and other options for treatment, the patient has consented to  Procedure(s): Coronary Stent Intervention (N/A) as a surgical intervention .  The patient's history has been reviewed, patient examined, no change in status, stable for surgery.  I have reviewed the patient's chart and labs.  Questions were answered to the patient's satisfaction.     Lorine BearsMuhammad Christelle Igoe

## 2016-04-27 LAB — CBC
HCT: 29.2 % — ABNORMAL LOW (ref 36.0–46.0)
Hemoglobin: 9.5 g/dL — ABNORMAL LOW (ref 12.0–15.0)
MCH: 33.1 pg (ref 26.0–34.0)
MCHC: 32.5 g/dL (ref 30.0–36.0)
MCV: 101.7 fL — ABNORMAL HIGH (ref 78.0–100.0)
Platelets: 101 10*3/uL — ABNORMAL LOW (ref 150–400)
RBC: 2.87 MIL/uL — ABNORMAL LOW (ref 3.87–5.11)
RDW: 14.1 % (ref 11.5–15.5)
WBC: 10.5 10*3/uL (ref 4.0–10.5)

## 2016-04-27 LAB — URINALYSIS, COMPLETE (UACMP) WITH MICROSCOPIC
Bacteria, UA: NONE SEEN
Bilirubin Urine: NEGATIVE
Glucose, UA: NEGATIVE mg/dL
Hgb urine dipstick: NEGATIVE
Ketones, ur: NEGATIVE mg/dL
Nitrite: NEGATIVE
Protein, ur: NEGATIVE mg/dL
Specific Gravity, Urine: 1.018 (ref 1.005–1.030)
pH: 5 (ref 5.0–8.0)

## 2016-04-27 LAB — BASIC METABOLIC PANEL
Anion gap: 7 (ref 5–15)
BUN: 32 mg/dL — ABNORMAL HIGH (ref 6–20)
CO2: 22 mmol/L (ref 22–32)
Calcium: 8.9 mg/dL (ref 8.9–10.3)
Chloride: 107 mmol/L (ref 101–111)
Creatinine, Ser: 1.06 mg/dL — ABNORMAL HIGH (ref 0.44–1.00)
GFR calc Af Amer: 53 mL/min — ABNORMAL LOW (ref >60)
GFR calc non Af Amer: 46 mL/min — ABNORMAL LOW (ref >60)
Glucose, Bld: 139 mg/dL — ABNORMAL HIGH (ref 65–99)
Potassium: 5 mmol/L (ref 3.5–5.1)
Sodium: 136 mmol/L (ref 135–145)

## 2016-04-27 LAB — GLUCOSE, CAPILLARY
GLUCOSE-CAPILLARY: 142 mg/dL — AB (ref 65–99)
GLUCOSE-CAPILLARY: 138 mg/dL — AB (ref 65–99)
Glucose-Capillary: 136 mg/dL — ABNORMAL HIGH (ref 65–99)
Glucose-Capillary: 151 mg/dL — ABNORMAL HIGH (ref 65–99)

## 2016-04-27 MED ORDER — FUROSEMIDE 20 MG PO TABS
20.0000 mg | ORAL_TABLET | Freq: Every day | ORAL | Status: DC
  Administered 2016-04-27 – 2016-05-01 (×5): 20 mg via ORAL
  Filled 2016-04-27 (×5): qty 1

## 2016-04-27 MED FILL — Heparin Sodium (Porcine) 2 Unit/ML in Sodium Chloride 0.9%: INTRAMUSCULAR | Qty: 500 | Status: AC

## 2016-04-27 NOTE — Evaluation (Signed)
Physical Therapy Evaluation Patient Details Name: [name] MRN: [id number] DOB: 11-24-1926 Today's Date: 04/27/2016   History of Present Illness  Patient is a 82 year old female admitted for NSTEMI on 04/20/16, stent placement 04/26/16. PMH includes CHF, pacemaker, HTN, afib, hyperlipidemia, and DM.   Clinical Impression  Patient presents with decreased balance, activity tolerance, and strength and would benefit from physical therapy to address these deficits. Patient's prior level of function or home environment is not confirmed and is incomplete due to the patient's confusion, but attending RN stated that the patient's granddaughter said that the patient did not have any previous cognitive deficits. The patient's confusion was present throughout the session. Patient ambulated approximately 60 feet with multimodal cues given throughout without much success, and she was fatigued afterwards. Patient was educated on discharge plan, and agrees that SNF is a good option. Will continue to follow.   HR range: 70-85 SPO2 1 L O2: 94-98%    Follow Up Recommendations SNF;Supervision/Assistance - 24 hour    Equipment Recommendations  Rolling walker with 5" wheels;3in1 (PT)    Recommendations for Other Services Speech consult;OT consult     Precautions / Restrictions Precautions Precautions: Fall Precaution Comments: patient reports multiple falls in past year  Restrictions Weight Bearing Restrictions: No      Mobility  Bed Mobility               General bed mobility comments: patient in chair on arrival   Transfers Overall transfer level: Needs assistance Equipment used: Rolling walker (2 wheeled) Transfers: Sit to/from [employer] CorporationStand;Stand Pivot Transfers Sit to Stand: Min assist Stand pivot transfers: Min assist       General transfer comment: Patient required min assist for power up from chair and hand placement on RW. Patient required steadying upon standing. sit to stand x2  for toileting and ambulation.   Ambulation/Gait Ambulation/Gait assistance: +2 safety/equipment;Min assist Ambulation Distance (Feet): 60 Feet Assistive device: Rolling walker (2 wheeled) Gait Pattern/deviations: Decreased stride length;Trunk flexed Gait velocity: decreased  Gait velocity interpretation: Below normal speed for age/gender General Gait Details: patient required several cues for placement in RW and redirections of RW/management of lines and equipment. Chair follow.   Stairs            Wheelchair Mobility    Modified Rankin (Stroke Patients Only)       Balance Overall balance assessment: Needs assistance Sitting-balance support: Feet supported;Bilateral upper extremity supported Sitting balance-Leahy Scale: Fair   Postural control: Posterior lean Standing balance support: Bilateral upper extremity supported Standing balance-Leahy Scale: Poor Standing balance comment: patient relied on RW for support as well as PT steadying.                              Pertinent Vitals/Pain Pain Assessment: No/denies pain    Home Living Family/patient expects to be discharged to:: Private residence Living Arrangements: Alone   Type of Home: House       Home Layout: One level        Prior Function Level of Independence: Independent               Hand Dominance        Extremity/Trunk Assessment   Upper Extremity Assessment: Generalized weakness           Lower Extremity Assessment: Generalized weakness      Cervical / Trunk Assessment: Kyphotic  Communication      Cognition  Arousal/Alertness: Awake/alert   Overall Cognitive Status: Impaired/Different from baseline Area of Impairment: Orientation;Attention;Memory;Awareness;Following commands;Safety/judgement Orientation Level: Disoriented to;Place;Time;Situation Current Attention Level: Sustained Memory: Decreased short-term memory Following Commands: Follows one step  commands inconsistently Safety/Judgement: Decreased awareness of safety     General Comments: Patient's responses to history questions were inconsistent and took patient increased time to state name and birthday. Patient's responses to questions were not appropriate for what was being asked. Tangential and internally distracted.     General Comments      Exercises     Assessment/Plan    PT Assessment Patient needs continued PT services  PT Problem List Decreased strength;Decreased activity tolerance;Decreased mobility;Decreased balance;Decreased coordination;Decreased cognition;Decreased knowledge of use of DME;Decreased safety awareness          PT Treatment Interventions DME instruction;Gait training;Functional mobility training;Therapeutic activities;Therapeutic exercise;Balance training;Patient/family education    PT Goals (Current goals can be found in the Care Plan section)       Frequency Min 3X/week   Barriers to discharge        Co-evaluation               End of Session Equipment Utilized During Treatment: Gait belt;Oxygen Activity Tolerance: Patient limited by fatigue;Patient limited by lethargy Patient left: in chair;with call bell/phone within reach;with chair alarm set Nurse Communication: Mobility status         Time: [id number] PT Time Calculation (min) (ACUTE ONLY): 28 min   Charges:   PT Evaluation $PT Eval Moderate Complexity: 1 Procedure PT Treatments $Gait Training: 8-22 mins   PT G Codes:        Slyvester Latona 04/27/2016, 3:16 PM  Eathon Valade SPT [phone]

## 2016-04-27 NOTE — Progress Notes (Addendum)
Patient Name: [name] Date of Encounter: 04/27/2016  [facility]  Hospital Problem List     Principal Problem:   NSTEMI (non-ST elevated myocardial infarction) [facility](HCC) Active Problems:   ATRIAL FIBRILLATION   PACEMAKER, PERMANENT   Abdominal pain   CHB (complete heart block) (HCC) post AVN ablation   PAH (pulmonary artery hypertension)   Subjective   The patient denies CP or SOB. She complain of a back pain that is chronic.  Inpatient Medications    Scheduled Meds: . aspirin EC  81 mg Oral Daily  . atorvastatin  80 mg Oral q1800  . clopidogrel  75 mg Oral Daily  . ferrous sulfate  325 mg Oral Q breakfast  . insulin aspart  0-9 Units Subcutaneous TID WC  . levothyroxine  75 mcg Oral QAC breakfast  . mouth rinse  15 mL Mouth Rinse BID  . potassium chloride  10 mEq Oral Daily  . sodium chloride flush  3 mL Intravenous Q12H   Continuous Infusions: . sodium chloride 10 mL/hr at 04/26/16 0535  . nitroGLYCERIN Stopped (04/25/16 1130)   PRN Meds: sodium chloride, sodium chloride, acetaminophen, diazepam, hyoscyamine, meclizine, nitroGLYCERIN, ondansetron (ZOFRAN) IV, polyethylene glycol, sodium chloride flush, traMADol   Vital Signs    Vitals:   04/27/16 0300 04/27/16 0400 04/27/16 0600 04/27/16 0700  BP: 124/63 (!) 123/59 117/62 118/65  Pulse: 70 70 69 70  Resp: (!) 28 (!) 25  (!) 22  Temp: 97.9 F (36.6 C)   97.8 F (36.6 C)  TempSrc: Oral   Oral  SpO2: 96% 95% 97% 97%  Weight:      Height:        Intake/Output Summary (Last 24 hours) at 04/27/16 1005 Last data filed at 04/27/16 0400  Gross per 24 hour  Intake           371.38 ml  Output              825 ml  Net          -453.62 ml   Filed Weights   04/20/16 0347 04/23/16 1940 04/25/16 2020  Weight: 141 lb (64 kg) 150 lb (68 kg) 157 lb 11.2 oz (71.5 kg)    Physical Exam  GEN: Well nourished, well developed, in no acute distress.  HEENT: Grossly normal.  Neck: Supple, no JVD,  carotid bruits, or masses. Cardiac: RRR, split S2, 3/6 holosystolic LLSB and apical murmur, no diastolic murmurs, rubs, or gallops. No clubbing, cyanosis, edema.  Radials/DP/PT 2+ and equal bilaterally.  Respiratory:  Respirations regular and unlabored, clear to auscultation bilaterally. GI: Soft, nontender, nondistended, BS + x 4. MS: no deformity or atrophy. Skin: warm and dry, no rash. Neuro:  Strength and sensation are intact. Bruise in the access r radial area, good pulses   Labs    CBC  Recent Labs  04/26/16 0232 04/27/16 0347  WBC 8.9 10.5  HGB 9.7* 9.5*  HCT 30.1* 29.2*  MCV 100.7* 101.7*  PLT 98* 101*   Basic Metabolic Panel  Recent Labs  04/26/16 0232 04/27/16 0347  NA 135 136  K 4.4 5.0  CL 103 107  CO2 23 22  GLUCOSE 173* 139*  BUN 32* 32*  CREATININE 1.17* 1.06*  CALCIUM 8.9 8.9   Telemetry    V paced, afib with CHB - Personally Reviewed  ECG    V paced, afib with CHB - Personally Reviewed  Radiology    No results found.  Cardiac  Studies   Echo 04/22/16:  - Left ventricle: The cavity size was normal. Systolic function wasmildly to moderately reduced. The estimated ejection fraction wasin the range of 40% to 45%. Akinesis of the inferolateralmyocardium; consistent with infarction in the distribution of theleft circumflex coronary artery. Doppler parameters areconsistent with restrictive physiology, indicative of decreasedleft ventricular diastolic compliance and/or increased left atrial pressure. - Ventricular septum: Septal motion showed paradox. These changes are consistent with right ventricular pacing. - Mitral valve: Calcified annulus. There was moderate regurgitation directed centrally. - Left atrium: The atrium was moderately to severely dilated. - Right ventricle: The cavity size was moderately dilated. Systolic function was mildly reduced. - Right atrium: The atrium was moderately to severely dilated. - Tricuspid  valve: There was moderate regurgitation. - Pulmonary arteries: Systolic pressure was moderately increased. PA peak pressure: 65 mm Hg (S).  Cath 04/20/16: Diagnostic Diagram     Implants        No implant documentation for this case.     Hemo Data   Flowsheet Row Most Recent Value  AO Systolic Pressure 118 mmHg  AO Diastolic Pressure 57 mmHg  AO Mean 80 mmHg  LV Systolic Pressure 116 mmHg  LV Diastolic Pressure 15 mmHg  LV EDP 25 mmHg  Arterial Occlusion Pressure Extended Systolic Pressure 115 mmHg  Arterial Occlusion Pressure Extended Diastolic Pressure 54 mmHg  Arterial Occlusion Pressure Extended Mean Pressure 78 mmHg  Left Ventricular Apex Extended Systolic Pressure 116 mmHg  Left Ventricular Apex Extended Diastolic Pressure 15 mmHg  Left Ventricular Apex Extended EDP Pressure 25 mmHg      Patient Profile     82 yo pacemaker dependent patient (s/p AV node ablation for permanent AFib) presents with CHF exacerbation and acute MI and has a high grade LCx stenosis as well as a moderate to severe mid LAD stenosis. After surgical consultation, felt to be best suited for PCI/stent LCX on Monday.  Assessment & Plan    1. CAD s/p lateral MI: The patient underwent a complex PCI to LCX/OM lesion with DES on 12/4, s/p staged PCI to proximal LAD on 12/6, plan to observe 1 more day and discharge tomorrow. Transfer to telemetry.   2. Acute  systolic (on chronic diastolic heart failure): appears mildly overloaded, I will start lasix 20 mg po BID, home dose 20 mg po daily. Crea today 1.0.  3. CHB: pacemaker dependent after AV node ablation. 4. AFib: permanent, will need continued anticoagulation - to be kept in mind when making stent choice. 5. PAH: appears chronic and disproportionate to the acuity of systolic left heart failure. We may be underestimating the degree of MR, she may have cardiac amyloidosis or there may be another reason for PAH. Reevaluate after PCI. 6.  Pacemaker: normal function at last check 02/29/16.  Signed, Tobias AlexanderKatarina Odelle Kosier, MD  04/27/2016, 10:05 AM

## 2016-04-27 NOTE — Progress Notes (Signed)
CARDIAC REHAB PHASE I   PRE:  Rate/Rhythm: 70 pacing  BP:  Supine:   Sitting: 117/51  Standing:    SaO2: 99% 2.5L  MODE:  Ambulation: 80 ft   POST:  Rate/Rhythm: 78 paced  BP:  Supine:   Sitting: 119/49  Standing:    SaO2: 98% 3l 1000-1049 Pt is very weak and stated she lives by herself. Would recommend PT to evaluate for discharge needs as pt also making confused statements as". [place] but I don't like baseball players". Pt walked 80 ft on 3L with gait belt use, rolling walker and asst x 2. Pt had to be reminded to stay close to walker. Tired very easily and we had to pull recliner up to her to sit quickly. Put chair alarm on. Asked RN to get PT/OT order. Pt is very unsafe walking and high risk for fall.    Luetta Nuttingharlene Koryn Charlot, RN BSN  04/27/2016 10:44 AM

## 2016-04-27 NOTE — [place] FL2 (Signed)
[place] MEDICAID FL2 LEVEL OF CARE SCREENING TOOL     IDENTIFICATION  Patient Name: [name] Birthdate: 07/11/1926 Sex: female Admission Date (Current Location): 04/20/2016  [facility] [place]   Facility and Address:  [facility], [address], [place], [place] [address]      Provider Number: [id number]  Attending Physician Name and Address:  Corky CraftsJayadeep S Varanasi, MD  Relative Name and Phone Number:       Current Level of Care: Hospital Recommended Level of Care: Skilled Nursing Facility Prior Approval Number:    Date Approved/Denied:   PASRR Number: [id number]  Discharge Plan: SNF    Current Diagnoses: Patient Active Problem List   Diagnosis Date Noted  . CHB (complete heart block) (HCC) post AVN ablation 04/22/2016  . PAH (pulmonary artery hypertension) 04/22/2016  . NSTEMI (non-ST elevated myocardial infarction) (HCC) 04/20/2016  . Abdominal pain   . Encounter for therapeutic drug monitoring 09/04/2013  . Long term current use of anticoagulant therapy 01/06/2013  . Renal insufficiency 09/12/2010  . Dvt femoral (deep venous thrombosis) (HCC) 09/08/2010  . Hearing loss 11/08/2009  . BACK PAIN 09/06/2009  . PACEMAKER, PERMANENT 10/13/2008  . OSTEOPOROSIS 07/05/2007  . Dyslipidemia 05/03/2007  . TREMOR, ESSENTIAL 02/26/2007  . Congestive heart failure (HCC) 01/26/2007  . Hypothyroidism 01/02/2007  . Diabetes mellitus with neuropathy (HCC) 01/02/2007  . Anxiety state 01/02/2007  . Essential hypertension 01/02/2007  . ATRIAL FIBRILLATION 01/02/2007  . DIVERTICULOSIS, COLON 01/02/2007    Orientation RESPIRATION BLADDER Height & Weight     Self, Place  O2 (Nasal cannula 3L) Continent Weight: 71.5 kg (157 lb 11.2 oz) Height:  5\' 4"  (162.6 cm)  BEHAVIORAL SYMPTOMS/MOOD NEUROLOGICAL BOWEL NUTRITION STATUS      Continent Diet (Please see DC Summary)  AMBULATORY STATUS COMMUNICATION OF NEEDS Skin   Limited  Assist Verbally Normal                       Personal Care Assistance Level of Assistance  Bathing, Feeding, Dressing Bathing Assistance: Limited assistance Feeding assistance: Independent Dressing Assistance: Limited assistance     Functional Limitations Info             SPECIAL CARE FACTORS FREQUENCY  PT (By licensed PT)     PT Frequency: 5x/week              Contractures      Additional Factors Info  Code Status, Allergies, Insulin Sliding Scale Code Status Info: Full Allergies Info: Codeine Sulfate, Simvastatin   Insulin Sliding Scale Info: insulin aspart (novoLOG) injection 0-9 Units       Current Medications (04/27/2016):  This is the current hospital active medication list Current Facility-Administered Medications  Medication Dose Route Frequency Provider Last Rate Last Dose  . 0.9 %  sodium chloride infusion  250 mL Intravenous PRN Lennette Biharihomas A Kelly, MD      . 0.9 %  sodium chloride infusion   Intravenous Continuous Lennette Biharihomas A Kelly, MD 10 mL/hr at 04/26/16 0535    . 0.9 %  sodium chloride infusion  250 mL Intravenous PRN [place] OuchMuhammad A Arida, MD      . acetaminophen (TYLENOL) tablet 650 mg  650 mg Oral Q4H PRN Lennette Biharihomas A Kelly, MD   650 mg at 04/25/16 1208  . aspirin EC tablet 81 mg  81 mg Oral Daily Wilma FlavinJedrek E Wosik, MD   81 mg at 04/27/16 1013  . atorvastatin (  LIPITOR) tablet 80 mg  80 mg Oral q1800 Lennette Biharihomas A Kelly, MD   80 mg at 04/26/16 1720  . clopidogrel (PLAVIX) tablet 75 mg  75 mg Oral Daily Mihai Croitoru, MD   75 mg at 04/27/16 1013  . diazepam (VALIUM) tablet 2 mg  2 mg Oral Q6H PRN Lennette Biharihomas A Kelly, MD   2 mg at 04/27/16 0002  . ferrous sulfate tablet 325 mg  325 mg Oral Q breakfast Wilma FlavinJedrek E Wosik, MD   325 mg at 04/27/16 [id number]  . furosemide (LASIX) tablet 20 mg  20 mg Oral Daily Lars MassonKatarina H Nelson, MD   20 mg at 04/27/16 1102  . hyoscyamine (LEVBID) 0.375 MG 12 hr tablet 0.375 mg  0.375 mg Oral Q12H PRN Wilma FlavinJedrek E Wosik, MD      . insulin aspart (novoLOG)  injection 0-9 Units  0-9 Units Subcutaneous TID WC Yvonne Kendallhristopher End, MD   1 Units at 04/27/16 1335  . levothyroxine (SYNTHROID, LEVOTHROID) tablet 75 mcg  75 mcg Oral QAC breakfast Wilma FlavinJedrek E Wosik, MD   75 mcg at 04/27/16 0826  . meclizine (ANTIVERT) tablet 25 mg  25 mg Oral TID PRN Wilma FlavinJedrek E Wosik, MD      . MEDLINE mouth rinse  15 mL Mouth Rinse BID Corky CraftsJayadeep S Varanasi, MD   15 mL at 04/27/16 1014  . nitroGLYCERIN (NITROSTAT) SL tablet 0.4 mg  0.4 mg Sublingual Q5 Min x 3 PRN Wilma FlavinJedrek E Wosik, MD      . nitroGLYCERIN 50 mg in dextrose 5 % 250 mL (0.2 mg/mL) infusion  0-200 mcg/min Intravenous Titrated Arty BaumgartnerLindsay B Roberts, NP   Stopped at 04/25/16 1130  . ondansetron (ZOFRAN) injection 4 mg  4 mg Intravenous Q6H PRN Lennette Biharihomas A Kelly, MD   4 mg at 04/24/16 2354  . polyethylene glycol (MIRALAX / GLYCOLAX) packet 17 g  17 g Oral Daily PRN Wilma FlavinJedrek E Wosik, MD   17 g at 04/25/16 1212  . potassium chloride (K-DUR,KLOR-CON) CR tablet 10 mEq  10 mEq Oral Daily Wilma FlavinJedrek E Wosik, MD   10 mEq at 04/27/16 1013  . sodium chloride flush (NS) 0.9 % injection 3 mL  3 mL Intravenous Q12H [place] OuchMuhammad A Arida, MD   3 mL at 04/27/16 1014  . sodium chloride flush (NS) 0.9 % injection 3 mL  3 mL Intravenous PRN [place] OuchMuhammad A Arida, MD      . traMADol Janean Sark(ULTRAM) tablet 50 mg  50 mg Oral Q6H PRN Wilma FlavinJedrek E Wosik, MD   50 mg at 04/27/16 0002     Discharge Medications: Please see discharge summary for a list of discharge medications.  Relevant Imaging Results:  Relevant Lab Results:   Additional Information SSN: [id number] [address]  Rodderick Holtzer S [place], [place]

## 2016-04-27 NOTE — Clinical Social Work Note (Signed)
Clinical Social Work Assessment  Patient Details  Name: [name] MRN: [id number] Date of Birth: December 14, 1926  Date of referral:  04/27/16               Reason for consult:  Facility Placement                Permission sought to share information with:  Facility Medical sales representativeContact Representative, Family Supports Permission granted to share information::  No (Disoriented)  Name::     Financial controllerAngela  Agency::  SNFS  Relationship::  Granddaughter  Contact Information:  [phone]  Housing/Transportation Living arrangements for the past 2 months:  Single Family Home Source of Information:  Other (Comment Required) Patient Interpreter Needed:  None Criminal Activity/Legal Involvement Pertinent to Current Situation/Hospitalization:  No - Comment as needed Significant Relationships:  Adult Children, Other Family Members Lives with:  Self Do you feel safe going back to the place where you live?  No Need for family participation in patient care:  Yes (Comment)  Care giving concerns:  CSW received consult for possible SNF placement at time of discharge. Patient is disoriented. CSW spoke with patient's granddaughter regarding PT recommendation of SNF placement at time of discharge. Per patient's granddaughter and decision maker, patient lives alone and is currently unable to care for herself at home given patient's current physical needs and fall risk. Patient's granddaughter expressed understanding of PT recommendation and is agreeable to SNF placement at time of discharge. CSW to continue to follow and assist with discharge planning needs.   Social Worker assessment / plan:  CSW spoke with patient's granddaughter [facility].  Employment status:  Retired Database administratornsurance information:  Managed Medicare PT Recommendations:  Skilled Nursing Facility Information / Referral to community resources:  Skilled Nursing Facility  Patient/Family's Response to care:   Patient's granddaughter recognizes need for rehab before returning home and [place]. Marylene Landngela reports being stressed about figuring out care plans. CSW provided supportive listening. Marylene Landngela expressed appreciation for CSW help.  Patient/Family's Understanding of and Emotional Response to Diagnosis, Current Treatment, and Prognosis:  Patient/family is realistic regarding therapy needs and expressed being hopeful for SNF placement. Patient's granddaughter expressed understanding of CSW role and discharge process. No questions/concerns about plan or treatment.    Emotional Assessment Appearance:  Appears stated age Attitude/Demeanor/Rapport:  Unable to Assess Affect (typically observed):  Unable to Assess Orientation:  Oriented to Self, Oriented to Place Alcohol / Substance use:  Not Applicable Psych involvement (Current and /or in the community):  No (Comment)  Discharge Needs  Concerns to be addressed:  Care Coordination Readmission within the last 30 days:  No Current discharge risk:  None Barriers to Discharge:  Continued Medical Work up   [employer] S Lelar Farewell, LCSWA 04/27/2016, 2:16 PM

## 2016-04-28 LAB — BASIC METABOLIC PANEL
Anion gap: 8 (ref 5–15)
BUN: 29 mg/dL — ABNORMAL HIGH (ref 6–20)
CO2: 20 mmol/L — ABNORMAL LOW (ref 22–32)
Calcium: 8.7 mg/dL — ABNORMAL LOW (ref 8.9–10.3)
Chloride: 107 mmol/L (ref 101–111)
Creatinine, Ser: 1.09 mg/dL — ABNORMAL HIGH (ref 0.44–1.00)
GFR calc Af Amer: 51 mL/min — ABNORMAL LOW (ref >60)
GFR calc non Af Amer: 44 mL/min — ABNORMAL LOW (ref >60)
Glucose, Bld: 112 mg/dL — ABNORMAL HIGH (ref 65–99)
Potassium: 4.4 mmol/L (ref 3.5–5.1)
Sodium: 135 mmol/L (ref 135–145)

## 2016-04-28 LAB — GLUCOSE, CAPILLARY
GLUCOSE-CAPILLARY: 129 mg/dL — AB (ref 65–99)
GLUCOSE-CAPILLARY: 126 mg/dL — AB (ref 65–99)
Glucose-Capillary: 153 mg/dL — ABNORMAL HIGH (ref 65–99)
Glucose-Capillary: 125 mg/dL — ABNORMAL HIGH (ref 65–99)

## 2016-04-28 LAB — CBC
HCT: 28.4 % — ABNORMAL LOW (ref 36.0–46.0)
Hemoglobin: 9 g/dL — ABNORMAL LOW (ref 12.0–15.0)
MCH: 32.4 pg (ref 26.0–34.0)
MCHC: 31.7 g/dL (ref 30.0–36.0)
MCV: 102.2 fL — ABNORMAL HIGH (ref 78.0–100.0)
Platelets: 102 10*3/uL — ABNORMAL LOW (ref 150–400)
RBC: 2.78 MIL/uL — ABNORMAL LOW (ref 3.87–5.11)
RDW: 14.6 % (ref 11.5–15.5)
WBC: 9.9 10*3/uL (ref 4.0–10.5)

## 2016-04-28 MED ORDER — WARFARIN - PHARMACIST DOSING INPATIENT
Freq: Every day | Status: DC
  Administered 2016-04-28 – 2016-05-01 (×2)

## 2016-04-28 MED ORDER — WARFARIN SODIUM 2 MG PO TABS
4.0000 mg | ORAL_TABLET | Freq: Once | ORAL | Status: AC
  Administered 2016-04-28: 4 mg via ORAL
  Filled 2016-04-28: qty 2

## 2016-04-28 NOTE — Progress Notes (Signed)
Patient Name: [name] Date of Encounter: 04/28/2016  Primary Cardiologist: [facility] Problem List     Principal Problem:   NSTEMI (non-ST elevated myocardial infarction) [facility]) Active Problems:   ATRIAL FIBRILLATION   PACEMAKER, PERMANENT   Abdominal pain   CHB (complete heart block) (HCC) post AVN ablation   PAH (pulmonary artery hypertension)   Subjective   The patient denies CP or SOB. She feels tired and states that she doesn't want to go home yet.  Inpatient Medications    Scheduled Meds: . aspirin EC  81 mg Oral Daily  . atorvastatin  80 mg Oral q1800  . clopidogrel  75 mg Oral Daily  . ferrous sulfate  325 mg Oral Q breakfast  . furosemide  20 mg Oral Daily  . insulin aspart  0-9 Units Subcutaneous TID WC  . levothyroxine  75 mcg Oral QAC breakfast  . mouth rinse  15 mL Mouth Rinse BID  . potassium chloride  10 mEq Oral Daily  . sodium chloride flush  3 mL Intravenous Q12H   Continuous Infusions: . sodium chloride 10 mL/hr at 04/26/16 0535  . nitroGLYCERIN Stopped (04/25/16 1130)   PRN Meds: sodium chloride, sodium chloride, acetaminophen, diazepam, hyoscyamine, meclizine, nitroGLYCERIN, ondansetron (ZOFRAN) IV, polyethylene glycol, sodium chloride flush, traMADol   Vital Signs    Vitals:   04/27/16 2300 04/28/16 0000 04/28/16 0300 04/28/16 0846  BP: (!) 110/53 (!) 110/53 134/60 126/71  Pulse: 70 70 73 70  Resp: (!) 29 (!) 30 (!) 31 19  Temp: 98.9 F (37.2 C)  98.3 F (36.8 C) 98.3 F (36.8 C)  TempSrc: Oral  Oral Oral  SpO2: 100% 100% 98% 100%  Weight:      Height:        Intake/Output Summary (Last 24 hours) at 04/28/16 0928 Last data filed at 04/28/16 0216  Gross per 24 hour  Intake              723 ml  Output              450 ml  Net              273 ml   Filed Weights   04/20/16 0347 04/23/16 1940 04/25/16 2020  Weight: 141 lb (64 kg) 150 lb (68 kg) 157 lb 11.2 oz (71.5 kg)    Physical Exam  GEN: Well nourished, well  developed, in no acute distress.  HEENT: Grossly normal.  Neck: Supple, no JVD, carotid bruits, or masses. Cardiac: RRR, split S2, 3/6 holosystolic LLSB and apical murmur, no diastolic murmurs, rubs, or gallops. No clubbing, cyanosis, edema.  Radials/DP/PT 2+ and equal bilaterally.  Respiratory:  Respirations regular and unlabored, clear to auscultation bilaterally. GI: Soft, nontender, nondistended, BS + x 4. MS: no deformity or atrophy. Skin: warm and dry, no rash. Neuro:  Strength and sensation are intact. Bruise in the access r radial area, good pulses   Labs    CBC  Recent Labs  04/27/16 0347 04/28/16 0151  WBC 10.5 9.9  HGB 9.5* 9.0*  HCT 29.2* 28.4*  MCV 101.7* 102.2*  PLT 101* 102*   Basic Metabolic Panel  Recent Labs  04/27/16 0347 04/28/16 0151  NA 136 135  K 5.0 4.4  CL 107 107  CO2 22 20*  GLUCOSE 139* 112*  BUN 32* 29*  CREATININE 1.06* 1.09*  CALCIUM 8.9 8.7*   Telemetry    V paced, afib with CHB - Personally Reviewed  ECG    V paced, afib with CHB - Personally Reviewed  Radiology    No results found.  Cardiac Studies   Echo 04/22/16:  - Left ventricle: The cavity size was normal. Systolic function wasmildly to moderately reduced. The estimated ejection fraction wasin the range of 40% to 45%. Akinesis of the inferolateralmyocardium; consistent with infarction in the distribution of theleft circumflex coronary artery. Doppler parameters areconsistent with restrictive physiology, indicative of decreasedleft ventricular diastolic compliance and/or increased left atrial pressure. - Ventricular septum: Septal motion showed paradox. These changes are consistent with right ventricular pacing. - Mitral valve: Calcified annulus. There was moderate regurgitation directed centrally. - Left atrium: The atrium was moderately to severely dilated. - Right ventricle: The cavity size was moderately dilated. Systolic function was mildly  reduced. - Right atrium: The atrium was moderately to severely dilated. - Tricuspid valve: There was moderate regurgitation. - Pulmonary arteries: Systolic pressure was moderately increased. PA peak pressure: 65 mm Hg (S).  Cath 04/20/16: Diagnostic Diagram     Implants        No implant documentation for this case.     Hemo Data   Flowsheet Row Most Recent Value  AO Systolic Pressure 118 mmHg  AO Diastolic Pressure 57 mmHg  AO Mean 80 mmHg  LV Systolic Pressure 116 mmHg  LV Diastolic Pressure 15 mmHg  LV EDP 25 mmHg  Arterial Occlusion Pressure Extended Systolic Pressure 115 mmHg  Arterial Occlusion Pressure Extended Diastolic Pressure 54 mmHg  Arterial Occlusion Pressure Extended Mean Pressure 78 mmHg  Left Ventricular Apex Extended Systolic Pressure 116 mmHg  Left Ventricular Apex Extended Diastolic Pressure 15 mmHg  Left Ventricular Apex Extended EDP Pressure 25 mmHg      Patient Profile     82 yo pacemaker dependent patient (s/p AV node ablation for permanent AFib) presents with CHF exacerbation and acute MI and has a high grade LCx stenosis as well as a moderate to severe mid LAD stenosis. After surgical consultation, felt to be best suited for PCI/stent LCX on Monday.  Assessment & Plan    1. CAD s/p lateral MI: The patient underwent a complex PCI to LCX/OM lesion with DES on 12/4, s/p staged PCI to proximal LAD on 12/6, plan to transfer to telemetry today, start PT&OT, consult to SW for rehab or SNF.   2. Acute  systolic (on chronic diastolic heart failure): appears mildly overloaded, I will increase lasix to 20 mg po BID, home dose 20 mg po daily. Crea today 1.09.  3. CHB: pacemaker dependent after AV node ablation. 4. AFib: permanent, will need continued anticoagulation - to be kept in mind when making stent choice. 5. PAH: appears chronic and disproportionate to the acuity of systolic left heart failure. We may be underestimating the degree of MR,  she may have cardiac amyloidosis or there may be another reason for PAH. Reevaluate after PCI. 6. Pacemaker: normal function at last check 02/29/16.  Signed, Tobias AlexanderKatarina Lawarence Meek, MD  04/28/2016, 9:28 AM

## 2016-04-28 NOTE — Progress Notes (Signed)
CARDIAC REHAB PHASE I   PRE:  Rate/Rhythm: 71 pacing    BP: sitting 121/72    SaO2: 99 4L  MODE:  Ambulation: 100 ft   POST:  Rate/Rhythm: 74 pacing    BP: sitting 122/63     SaO2: 100 2L, 95 RA  Pt to BSC before walking. Stated she couldn't urinate however [facility] in [facility] and also in floor. Pt stands fairly well but needs repeated directions of what to do. Fairly steady in hall with RW but difficulty turning right and needed us to turn RW for her twice. To recliner after walk. No c/o, not as tired today after walk. Pleasantly confused. Left off O2 in room in recliner. Notified RN. [phone]   Harriet MassonRandi Kristan Addelyn Alleman CES, ACSM 04/28/2016 3:13 PM

## 2016-04-28 NOTE — Progress Notes (Signed)
ANTICOAGULATION CONSULT NOTE - Initial Consult  Pharmacy Consult for warfarin Indication: atrial fibrillation  Allergies  Allergen Reactions  . Codeine Sulfate Shortness Of Breath and Other (See Comments)    Felt funny  . Simvastatin Other (See Comments)     choking, acid reflux    Patient Measurements: Height: 5\' 4"  (162.6 cm) Weight: 157 lb 11.2 oz (71.5 kg) IBW/kg (Calculated) : 54.7   Vital Signs: Temp: 97.4 F (36.3 C) (12/08 1128) Temp Source: Oral (12/08 1128) BP: 112/57 (12/08 1128) Pulse Rate: 71 (12/08 1128)  Labs:  Recent Labs  04/26/16 0232 04/27/16 0347 04/28/16 0151  HGB 9.7* 9.5* 9.0*  HCT 30.1* 29.2* 28.4*  PLT 98* 101* 102*  CREATININE 1.17* 1.06* 1.09*    Estimated Creatinine Clearance: 35.2 mL/min (by C-G formula based on SCr of 1.09 mg/dL (H)).   Assessment: [age] YOF s/p multiple cardiac procedures. She was on IV heparin, but this was stopped post-cath on 12/5 due to drop in Hgb, she went for PCI 12/6. Discussed with cardiology and ok to resume warfarin tonight.  Home dose of warfarin 2mg  daily except 4mg  on Wednesdays.  Hgb 9, platelets 102- low, but overall stable. No bleeding noted.  Goal of Therapy:  INR 2-3 Monitor platelets by anticoagulation protocol: Yes   Plan:  -warfarin 4mg  po x1 tonight -daily INR -follow CBC and s/s bleeding  Tyreece Gelles D. Zerina Hallinan, PharmD, BCPS Clinical Pharmacist Pager: [phone] 04/28/2016 2:18 PM

## 2016-04-28 NOTE — Progress Notes (Signed)
Pt transferred to 3w31. Pt belongings including dentures, cane, bath brush, and bag transferred with pt. Family called and updated.

## 2016-04-29 LAB — URINALYSIS, ROUTINE W REFLEX MICROSCOPIC
BILIRUBIN URINE: NEGATIVE
GLUCOSE, UA: NEGATIVE mg/dL
HGB URINE DIPSTICK: NEGATIVE
Ketones, ur: NEGATIVE mg/dL
NITRITE: NEGATIVE
PH: 5 (ref 5.0–8.0)
Protein, ur: NEGATIVE mg/dL
SPECIFIC GRAVITY, URINE: 1.011 (ref 1.005–1.030)

## 2016-04-29 LAB — PROTIME-INR
INR: 1.27
PROTHROMBIN TIME: 16 s — AB (ref 11.4–15.2)

## 2016-04-29 LAB — CBC
HCT: 27.2 % — ABNORMAL LOW (ref 36.0–46.0)
Hemoglobin: 8.7 g/dL — ABNORMAL LOW (ref 12.0–15.0)
MCH: 32.6 pg (ref 26.0–34.0)
MCHC: 32 g/dL (ref 30.0–36.0)
MCV: 101.9 fL — ABNORMAL HIGH (ref 78.0–100.0)
Platelets: 110 10*3/uL — ABNORMAL LOW (ref 150–400)
RBC: 2.67 MIL/uL — ABNORMAL LOW (ref 3.87–5.11)
RDW: 14.6 % (ref 11.5–15.5)
WBC: 7.8 10*3/uL (ref 4.0–10.5)

## 2016-04-29 LAB — BASIC METABOLIC PANEL
Anion gap: 7 (ref 5–15)
BUN: 30 mg/dL — ABNORMAL HIGH (ref 6–20)
CO2: 24 mmol/L (ref 22–32)
Calcium: 8.7 mg/dL — ABNORMAL LOW (ref 8.9–10.3)
Chloride: 106 mmol/L (ref 101–111)
Creatinine, Ser: 1.2 mg/dL — ABNORMAL HIGH (ref 0.44–1.00)
GFR calc Af Amer: 46 mL/min — ABNORMAL LOW (ref >60)
GFR calc non Af Amer: 39 mL/min — ABNORMAL LOW (ref >60)
Glucose, Bld: 125 mg/dL — ABNORMAL HIGH (ref 65–99)
Potassium: 4.4 mmol/L (ref 3.5–5.1)
Sodium: 137 mmol/L (ref 135–145)

## 2016-04-29 LAB — GLUCOSE, CAPILLARY
GLUCOSE-CAPILLARY: 126 mg/dL — AB (ref 65–99)
Glucose-Capillary: 135 mg/dL — ABNORMAL HIGH (ref 65–99)
Glucose-Capillary: 174 mg/dL — ABNORMAL HIGH (ref 65–99)
Glucose-Capillary: 126 mg/dL — ABNORMAL HIGH (ref 65–99)

## 2016-04-29 MED ORDER — ATORVASTATIN CALCIUM 40 MG PO TABS
40.0000 mg | ORAL_TABLET | Freq: Every day | ORAL | Status: DC
  Administered 2016-04-29 – 2016-05-01 (×3): 40 mg via ORAL
  Filled 2016-04-29 (×3): qty 1

## 2016-04-29 MED ORDER — WARFARIN SODIUM 4 MG PO TABS
4.0000 mg | ORAL_TABLET | Freq: Once | ORAL | Status: AC
  Administered 2016-04-29: 4 mg via ORAL
  Filled 2016-04-29: qty 1

## 2016-04-29 NOTE — Progress Notes (Signed)
ANTICOAGULATION CONSULT NOTE - Follow Up Consult  Pharmacy Consult for Coumadin Indication: atrial fibrillation  Allergies  Allergen Reactions  . Codeine Sulfate Shortness Of Breath and Other (See Comments)    Felt funny  . Simvastatin Other (See Comments)     choking, acid reflux    Patient Measurements: Height: 5\' 4"  (162.6 cm) Weight: 160 lb 12.8 oz (72.9 kg) IBW/kg (Calculated) : 54.7   Vital Signs: Temp: 98.1 F (36.7 C) (12/09 0435) Temp Source: Oral (12/09 0435) BP: 111/54 (12/09 0435) Pulse Rate: 71 (12/09 0435)  Labs:  Recent Labs  04/27/16 0347 04/28/16 0151 04/29/16 0302  HGB 9.5* 9.0* 8.7*  HCT 29.2* 28.4* 27.2*  PLT 101* 102* 110*  LABPROT  --   --  16.0*  INR  --   --  1.27  CREATININE 1.06* 1.09* 1.20*    Estimated Creatinine Clearance: 32.3 mL/min (by C-G formula based on SCr of 1.2 mg/dL (H)).   Assessment: 89yof on coumadin pta for afib, transitioned to IV heparin on admit for cardiac procedures. Heparin was stopped post-cath on 12/5 due to drop in Hgb, she went for PCI 12/6. Coumadin resumed last night. INR 1.27. Hgb and platelets low but stable at 8.7 and 110.   Home dose of warfarin 2mg  daily except 4mg  on Wednesdays.   Goal of Therapy:  INR 2-3 Monitor platelets by anticoagulation protocol: Yes   Plan:  1) Coumadin 4mg  x 1 2) Daily INR  Louie CasaJennifer Avis Tirone, PharmD, BCPS 04/29/2016 1:18 PM

## 2016-04-29 NOTE — Progress Notes (Signed)
Physical Therapy Treatment Patient Details Name: Maria BrunsMaxine F Holmes MRN: [id number] DOB: 1926/12/26 Today's Date: 04/29/2016    History of Present Illness Patient is a 82 year old female admitted for NSTEMI on 04/20/16, stent placement 04/26/16. PMH includes CHF, pacemaker, HTN, afib, hyperlipidemia, and DM.     PT Comments    Pt with improved mobility today with incr gait distances although decr safety during mobility and heavy reliance on walker support.  Pt with stooped posture during gait.  Will continue to follow patient while on this venue of care to progress mobility.   Follow Up Recommendations  SNF;Supervision/Assistance - 24 hour     Equipment Recommendations  Rolling walker with 5" wheels;3in1 (PT)    Recommendations for Other Services       Precautions / Restrictions Precautions Precautions: Fall Restrictions Weight Bearing Restrictions: No    Mobility  Bed Mobility Overal bed mobility: Needs Assistance Bed Mobility: Supine to Sit     Supine to sit: Min assist        Transfers Overall transfer level: Needs assistance Equipment used: Rolling walker (2 wheeled) Transfers: Sit to/from [employer]GI CorporationStand;Stand Pivot Transfers Sit to Stand: Min assist Stand pivot transfers: Min assist       General transfer comment: min cues for hand placement and walker placement  Ambulation/Gait Ambulation/Gait assistance: Min assist Ambulation Distance (Feet): 90 Feet Assistive device: Rolling walker (2 wheeled) Gait Pattern/deviations: Decreased stride length;Trunk flexed Gait velocity: decreased    General Gait Details: mod cues for walker placement, especially during turns   Careers information officertairs            Wheelchair Mobility    Modified Rankin (Stroke Patients Only)       Balance Overall balance assessment: Needs assistance Sitting-balance support: No upper extremity supported;Feet supported Sitting balance-Leahy Scale: Good     Standing balance support: Bilateral  upper extremity supported Standing balance-Leahy Scale: Fair Standing balance comment: reliant on external support for balance                    Cognition Arousal/Alertness: Awake/alert Behavior During Therapy: WFL for tasks assessed/performed Overall Cognitive Status: Impaired/Different from baseline Area of Impairment: Problem solving   Current Attention Level: Selective Memory: Decreased short-term memory Following Commands: Follows one step commands consistently Safety/Judgement: Decreased awareness of safety   Problem Solving: Slow processing;Requires verbal cues General Comments: While pt relaying story about her late husband, she intermixed names/people in the story frequently    Exercises      General Comments General comments (skin integrity, edema, etc.): applied warm compress wrapped in towel around neck at end of session to alleviate pain.      Pertinent Vitals/Pain Pain Assessment: Faces Faces Pain Scale: Hurts little more Pain Location: upper back/neck Pain Descriptors / Indicators: Aching Pain Intervention(s): Limited activity within patient's tolerance;Monitored during session;Repositioned;Heat applied    Home Living                      Prior Function            PT Goals (current goals can now be found in the care plan section) Acute Rehab PT Goals Patient Stated Goal: get back to stop hurting Progress towards PT goals: Progressing toward goals    Frequency    Min 3X/week      PT Plan Current plan remains appropriate    Co-evaluation             End of Session Equipment  Utilized During Treatment: Gait belt Activity Tolerance: Patient tolerated treatment well Patient left: in chair;with call bell/phone within reach     Time: [id number] PT Time Calculation (min) (ACUTE ONLY): 27 min  Charges:  $Gait Training: 8-22 mins $Therapeutic Activity: 8-22 mins                    G CodesNestor Lewandowsky:      Charisse Wendell M Margaret Cockerill,  PT [phone]  Daymen Hassebrock 04/29/2016, 12:15 PM

## 2016-04-29 NOTE — Progress Notes (Signed)
Patient Name: Maria BrunsMaxine F Szydlowski Date of Encounter: 04/29/2016  [facility]  Hospital Problem List     Principal Problem:   NSTEMI (non-ST elevated myocardial infarction) [facility](HCC) Active Problems:   ATRIAL FIBRILLATION   PACEMAKER, PERMANENT   Abdominal pain   CHB (complete heart block) (HCC) post AVN ablation   PAH (pulmonary artery hypertension)   Subjective   The patient denies CP or SOB. She was telling me about prior back surgery, back scratcher.  Inpatient Medications    Scheduled Meds: . aspirin EC  81 mg Oral Daily  . atorvastatin  80 mg Oral q1800  . clopidogrel  75 mg Oral Daily  . ferrous sulfate  325 mg Oral Q breakfast  . furosemide  20 mg Oral Daily  . insulin aspart  0-9 Units Subcutaneous TID WC  . levothyroxine  75 mcg Oral QAC breakfast  . mouth rinse  15 mL Mouth Rinse BID  . potassium chloride  10 mEq Oral Daily  . sodium chloride flush  3 mL Intravenous Q12H  . Warfarin - Pharmacist Dosing Inpatient   Does not apply q1800   Continuous Infusions: . sodium chloride 10 mL/hr at 04/26/16 0535  . nitroGLYCERIN Stopped (04/25/16 1130)   PRN Meds: sodium chloride, sodium chloride, acetaminophen, diazepam, hyoscyamine, meclizine, nitroGLYCERIN, ondansetron (ZOFRAN) IV, polyethylene glycol, sodium chloride flush, traMADol   Vital Signs    Vitals:   04/28/16 1800 04/28/16 1944 04/28/16 2316 04/29/16 0435  BP: (!) 97/48 (!) 104/55 106/73 (!) 111/54  Pulse: 70 70 71 71  Resp: (!) 22 20 18 18   Temp:  97.5 F (36.4 C) 97.9 F (36.6 C) 98.1 F (36.7 C)  TempSrc:  Oral Oral Oral  SpO2: (!) 88% 93% 98% 93%  Weight:   160 lb 12.8 oz (72.9 kg)   Height:        Intake/Output Summary (Last 24 hours) at 04/29/16 1017 Last data filed at 04/28/16 1800  Gross per 24 hour  Intake              480 ml  Output              500 ml  Net              -20 ml   Filed Weights   04/23/16 1940 04/25/16 2020 04/28/16 2316  Weight: 150 lb (68 kg) 157  lb 11.2 oz (71.5 kg) 160 lb 12.8 oz (72.9 kg)    Physical Exam  GEN: Well nourished, well developed, in no acute distress. Elderly HEENT: Grossly normal.  Neck: Supple, no JVD, carotid bruits, or masses. Cardiac: RRR, split S2, 3/6 holosystolic LLSB and apical murmur, no diastolic murmurs, rubs, or gallops. No clubbing, cyanosis, edema.  Radials/DP/PT 2+ and equal bilaterally.  Respiratory:  Respirations regular and unlabored, clear to auscultation bilaterally. GI: Soft, nontender, nondistended, BS + x 4. MS: no deformity or atrophy. Skin: warm and dry, no rash. Neuro:  Strength and sensation are intact. Bruise in the access r radial area, good pulses   Labs    CBC  Recent Labs  04/28/16 0151 04/29/16 0302  WBC 9.9 7.8  HGB 9.0* 8.7*  HCT 28.4* 27.2*  MCV 102.2* 101.9*  PLT 102* 110*   Basic Metabolic Panel  Recent Labs  04/28/16 0151 04/29/16 0302  NA 135 137  K 4.4 4.4  CL 107 106  CO2 20* 24  GLUCOSE 112* 125*  BUN 29* 30*  CREATININE 1.09* 1.20*  CALCIUM 8.7* 8.7*   Telemetry    V paced, afib with CHB - Personally Reviewed  ECG    V paced, afib with CHB - Personally Reviewed  Radiology    No results found.  Cardiac Studies   Echo 04/22/16:  - Left ventricle: The cavity size was normal. Systolic function wasmildly to moderately reduced. The estimated ejection fraction wasin the range of 40% to 45%. Akinesis of the inferolateralmyocardium; consistent with infarction in the distribution of theleft circumflex coronary artery. Doppler parameters areconsistent with restrictive physiology, indicative of decreasedleft ventricular diastolic compliance and/or increased left atrial pressure. - Ventricular septum: Septal motion showed paradox. These changes are consistent with right ventricular pacing. - Mitral valve: Calcified annulus. There was moderate regurgitation directed centrally. - Left atrium: The atrium was moderately to severely  dilated. - Right ventricle: The cavity size was moderately dilated. Systolic function was mildly reduced. - Right atrium: The atrium was moderately to severely dilated. - Tricuspid valve: There was moderate regurgitation. - Pulmonary arteries: Systolic pressure was moderately increased. PA peak pressure: 65 mm Hg (S).  Cath 04/20/16: Diagnostic Diagram     Implants        No implant documentation for this case.     Hemo Data   Flowsheet Row Most Recent Value  AO Systolic Pressure 118 mmHg  AO Diastolic Pressure 57 mmHg  AO Mean 80 mmHg  LV Systolic Pressure 116 mmHg  LV Diastolic Pressure 15 mmHg  LV EDP 25 mmHg  Arterial Occlusion Pressure Extended Systolic Pressure 115 mmHg  Arterial Occlusion Pressure Extended Diastolic Pressure 54 mmHg  Arterial Occlusion Pressure Extended Mean Pressure 78 mmHg  Left Ventricular Apex Extended Systolic Pressure 116 mmHg  Left Ventricular Apex Extended Diastolic Pressure 15 mmHg  Left Ventricular Apex Extended EDP Pressure 25 mmHg      Patient Profile     82 yo pacemaker dependent patient (s/p AV node ablation for permanent AFib) presents with CHF exacerbation and acute MI and has a high grade LCx stenosis as well as a moderate to severe mid LAD stenosis. After surgical consultation, felt to be best suited for PCI/stent LCX on Monday.  Assessment & Plan    1. CAD s/p lateral MI: The patient underwent a complex PCI to LCX/OM lesion with DES on 12/4, s/p staged PCI to proximal LAD on 12/6, trying to start PT&OT/Cardiac rehabilitation, consult to SW for rehab or SNF. I will adjust statin dose to 40 mg from 80 given her age.  2. Acute  systolic (on chronic diastolic heart failure):  lasix to 20 mg po BID, home dose 20 mg po daily. Crea today 1.2 up from 1.09. I will bring back to home dose of 20 mg a day.  3. CHB: pacemaker dependent after AV node ablation.  4. AFib: permanent, will need continued anticoagulation. Currently  on triple therapy. After one month, would stop aspirin and continue Plavix and warfarin together. She is at increased risk for bleeding.  5. PAH: appears chronic and disproportionate to the acuity of systolic left heart failure. We may be underestimating the degree of MR, she may have cardiac amyloidosis or there may be another reason for PAH. Reevaluate after PCI.  6. Pacemaker: normal function at last check 02/29/16.  I think she is reasonable to go to skilled nursing facility when available.  Signed, Donato SchultzMark Marbin Olshefski, MD  04/29/2016, 10:17 AM

## 2016-04-29 NOTE — Progress Notes (Signed)
[phone] Offered to walk with pt but she refused. Stated will do later as her back hurts. Pt's RN and we tried to encourage to walk as it may make her back feel better but again declined. Discussed with pt if interested in CRP 2 after she has recovered and she agreed to referral. Doubtful she will be able to attend. PT to see pt today also. Will return if time permits. Luetta Nuttingharlene Corby Vandenberghe RN BSN 04/29/2016 9:16 AM

## 2016-04-30 LAB — CBC
HCT: 27 % — ABNORMAL LOW (ref 36.0–46.0)
Hemoglobin: 8.7 g/dL — ABNORMAL LOW (ref 12.0–15.0)
MCH: 32.8 pg (ref 26.0–34.0)
MCHC: 32.2 g/dL (ref 30.0–36.0)
MCV: 101.9 fL — ABNORMAL HIGH (ref 78.0–100.0)
Platelets: 113 10*3/uL — ABNORMAL LOW (ref 150–400)
RBC: 2.65 MIL/uL — ABNORMAL LOW (ref 3.87–5.11)
RDW: 15 % (ref 11.5–15.5)
WBC: 7.2 10*3/uL (ref 4.0–10.5)

## 2016-04-30 LAB — GLUCOSE, CAPILLARY
GLUCOSE-CAPILLARY: 98 mg/dL (ref 65–99)
Glucose-Capillary: 144 mg/dL — ABNORMAL HIGH (ref 65–99)
Glucose-Capillary: 148 mg/dL — ABNORMAL HIGH (ref 65–99)
Glucose-Capillary: 130 mg/dL — ABNORMAL HIGH (ref 65–99)

## 2016-04-30 LAB — PROTIME-INR
INR: 1.36
PROTHROMBIN TIME: 16.9 s — AB (ref 11.4–15.2)

## 2016-04-30 MED ORDER — CIPROFLOXACIN HCL 500 MG PO TABS
250.0000 mg | ORAL_TABLET | Freq: Two times a day (BID) | ORAL | Status: DC
  Administered 2016-04-30 – 2016-05-01 (×3): 250 mg via ORAL
  Filled 2016-04-30 (×3): qty 1

## 2016-04-30 MED ORDER — POLYETHYLENE GLYCOL 3350 17 G PO PACK
17.0000 g | PACK | Freq: Every day | ORAL | Status: DC
  Administered 2016-05-01: 17 g via ORAL
  Filled 2016-04-30: qty 1

## 2016-04-30 MED ORDER — WARFARIN SODIUM 2 MG PO TABS
2.0000 mg | ORAL_TABLET | Freq: Once | ORAL | Status: AC
  Administered 2016-04-30: 2 mg via ORAL
  Filled 2016-04-30: qty 1

## 2016-04-30 NOTE — Progress Notes (Signed)
ANTICOAGULATION CONSULT NOTE - Follow Up Consult  Pharmacy Consult for Coumadin Indication: atrial fibrillation  Allergies  Allergen Reactions  . Codeine Sulfate Shortness Of Breath and Other (See Comments)    Felt funny  . Simvastatin Other (See Comments)     choking, acid reflux    Patient Measurements: Height: 5\' 4"  (162.6 cm) Weight: 160 lb 12.8 oz (72.9 kg) IBW/kg (Calculated) : 54.7   Vital Signs: Temp: 97.4 F (36.3 C) (12/10 0505) Temp Source: Oral (12/10 0505) BP: 103/50 (12/10 0505) Pulse Rate: 70 (12/10 0505)  Labs:  Recent Labs  04/28/16 0151 04/29/16 0302 04/30/16 0314  HGB 9.0* 8.7* 8.7*  HCT 28.4* 27.2* 27.0*  PLT 102* 110* 113*  LABPROT  --  16.0* 16.9*  INR  --  1.27 1.36  CREATININE 1.09* 1.20*  --     Estimated Creatinine Clearance: 32.3 mL/min (by C-G formula based on SCr of 1.2 mg/dL (H)).   Assessment: 89yof on coumadin pta for afib, transitioned to IV heparin on admit for cardiac procedures. Heparin was stopped post-cath on 12/5 due to drop in Hgb, she went for PCI 12/6. Coumadin resumed 12/8. INR trending up 1.27 > 1.36. Hgb and platelets low but stable at 8.7 and 113.  **Cipro added x 3 days for UTI which can potentiate INR so will need to watch for drug interaction.  Home dose: 2mg  daily except 4mg  on Wednesdays   Goal of Therapy:  INR 2-3 Monitor platelets by anticoagulation protocol: Yes   Plan:  1) Coumadin 2mg  x 1 2) Daily INR, CBC 3) Watch DI with cipro  Louie CasaJennifer Ayan Heffington, PharmD, BCPS 04/30/2016 11:33 AM

## 2016-04-30 NOTE — Progress Notes (Addendum)
Patient Name: [name] Date of Encounter: 04/30/2016  [facility]  Hospital Problem List     Principal Problem:   NSTEMI (non-ST elevated myocardial infarction) [facility](HCC) Active Problems:   ATRIAL FIBRILLATION   PACEMAKER, PERMANENT   Abdominal pain   CHB (complete heart block) (HCC) post AVN ablation   PAH (pulmonary artery hypertension)   Subjective   The patient denies CP or SOB.   Inpatient Medications    Scheduled Meds: . aspirin EC  81 mg Oral Daily  . atorvastatin  40 mg Oral q1800  . clopidogrel  75 mg Oral Daily  . ferrous sulfate  325 mg Oral Q breakfast  . furosemide  20 mg Oral Daily  . insulin aspart  0-9 Units Subcutaneous TID WC  . levothyroxine  75 mcg Oral QAC breakfast  . mouth rinse  15 mL Mouth Rinse BID  . potassium chloride  10 mEq Oral Daily  . sodium chloride flush  3 mL Intravenous Q12H  . Warfarin - Pharmacist Dosing Inpatient   Does not apply q1800   Continuous Infusions: . sodium chloride 10 mL/hr at 04/26/16 0535  . nitroGLYCERIN Stopped (04/25/16 1130)   PRN Meds: sodium chloride, sodium chloride, acetaminophen, diazepam, hyoscyamine, meclizine, nitroGLYCERIN, ondansetron (ZOFRAN) IV, polyethylene glycol, sodium chloride flush, traMADol   Vital Signs    Vitals:   04/29/16 0435 04/29/16 1545 04/29/16 1956 04/30/16 0505  BP: (!) 111/54 120/86 (!) 105/51 (!) 103/50  Pulse: 71 72 70 70  Resp: 18 19 18 18   Temp: 98.1 F (36.7 C)  98.3 F (36.8 C) 97.4 F (36.3 C)  TempSrc: Oral  Oral Oral  SpO2: 93% 97% 100% 100%  Weight:      Height:        Intake/Output Summary (Last 24 hours) at 04/30/16 1009 Last data filed at 04/30/16 0716  Gross per 24 hour  Intake                0 ml  Output              350 ml  Net             -350 ml   Filed Weights   04/23/16 1940 04/25/16 2020 04/28/16 2316  Weight: 150 lb (68 kg) 157 lb 11.2 oz (71.5 kg) 160 lb 12.8 oz (72.9 kg)    Physical Exam  GEN: Well nourished,  well developed, in no acute distress. Elderly HEENT: Grossly normal.  Neck: Supple, no JVD, carotid bruits, or masses. Cardiac: RRR, split S2, 3/6 holosystolic LLSB and apical murmur, no diastolic murmurs, rubs, or gallops. No clubbing, cyanosis, edema.  Radials/DP/PT 2+ and equal bilaterally.  Respiratory:  Respirations regular and unlabored, clear to auscultation bilaterally. GI: Soft, nontender, nondistended, BS + x 4. MS: no deformity or atrophy. Skin: warm and dry, no rash. Neuro:  Strength and sensation are intact. Bruise in the access r radial area, good pulses   Labs    CBC  Recent Labs  04/29/16 0302 04/30/16 0314  WBC 7.8 7.2  HGB 8.7* 8.7*  HCT 27.2* 27.0*  MCV 101.9* 101.9*  PLT 110* 113*   Basic Metabolic Panel  Recent Labs  04/28/16 0151 04/29/16 0302  NA 135 137  K 4.4 4.4  CL 107 106  CO2 20* 24  GLUCOSE 112* 125*  BUN 29* 30*  CREATININE 1.09* 1.20*  CALCIUM 8.7* 8.7*   Telemetry    V paced, afib with CHB -  Personally Reviewed  ECG    V paced, afib with CHB - Personally Reviewed  Radiology    No results found.  Cardiac Studies   Echo 04/22/16:  - Left ventricle: The cavity size was normal. Systolic function wasmildly to moderately reduced. The estimated ejection fraction wasin the range of 40% to 45%. Akinesis of the inferolateralmyocardium; consistent with infarction in the distribution of theleft circumflex coronary artery. Doppler parameters areconsistent with restrictive physiology, indicative of decreasedleft ventricular diastolic compliance and/or increased left atrial pressure. - Ventricular septum: Septal motion showed paradox. These changes are consistent with right ventricular pacing. - Mitral valve: Calcified annulus. There was moderate regurgitation directed centrally. - Left atrium: The atrium was moderately to severely dilated. - Right ventricle: The cavity size was moderately dilated. Systolic function was  mildly reduced. - Right atrium: The atrium was moderately to severely dilated. - Tricuspid valve: There was moderate regurgitation. - Pulmonary arteries: Systolic pressure was moderately increased. PA peak pressure: 65 mm Hg (S).  Cath 04/20/16: Diagnostic Diagram     Implants        No implant documentation for this case.     Hemo Data   Flowsheet Row Most Recent Value  AO Systolic Pressure 118 mmHg  AO Diastolic Pressure 57 mmHg  AO Mean 80 mmHg  LV Systolic Pressure 116 mmHg  LV Diastolic Pressure 15 mmHg  LV EDP 25 mmHg  Arterial Occlusion Pressure Extended Systolic Pressure 115 mmHg  Arterial Occlusion Pressure Extended Diastolic Pressure 54 mmHg  Arterial Occlusion Pressure Extended Mean Pressure 78 mmHg  Left Ventricular Apex Extended Systolic Pressure 116 mmHg  Left Ventricular Apex Extended Diastolic Pressure 15 mmHg  Left Ventricular Apex Extended EDP Pressure 25 mmHg      Patient Profile     82 yo pacemaker dependent patient (s/p AV node ablation for permanent AFib) presents with CHF exacerbation and acute MI and has a high grade LCx stenosis as well as a moderate to severe mid LAD stenosis.   Assessment & Plan    1. CAD s/p lateral MI: The patient underwent a complex PCI to LCX/OM lesion with DES on 12/4, s/p staged PCI to proximal LAD on 12/6, trying to start PT&OT/Cardiac rehabilitation, consult to SW for rehab or SNF. Continue statin/ASA/Plavix.  BP too soft for BB or ACE I right now.    2. Acute  systolic (on chronic diastolic heart failure):  lasix to 20 mg po a day.  BP too soft for BB or ACE I.  3. CHB: pacemaker dependent after AV node ablation.  4. AFib: permanent, will need continued anticoagulation. Currently on triple therapy. After one month, would stop aspirin and continue Plavix and warfarin together. She is at increased risk for bleeding.  INR 1.36 today.   5. PAH: appears chronic and disproportionate to the acuity of systolic  left heart failure. We may be underestimating the degree of MR, she may have cardiac amyloidosis or there may be another reason for PAH. Reevaluate after PCI.  6. Pacemaker: normal function at last check 02/29/16.  7. LLQ abdominal pain:  Suspect it is due to constipation.  Has not had a significant BM in 4 days.  UA showed TNTC WBCs but minimal bacteria ? Significance.  Will treat empirically for UTI with Cipro x 3 days.    I think she is reasonable to go to skilled nursing facility when available.  Signed, Armanda Magicraci Jameya Pontiff, MD  04/30/2016, 10:09 AM

## 2016-05-01 ENCOUNTER — Telehealth: Payer: Self-pay | Admitting: Interventional Cardiology

## 2016-05-01 DIAGNOSIS — E119 Type 2 diabetes mellitus without complications: Secondary | ICD-10-CM | POA: Diagnosis not present

## 2016-05-01 DIAGNOSIS — I482 Chronic atrial fibrillation: Secondary | ICD-10-CM | POA: Diagnosis not present

## 2016-05-01 DIAGNOSIS — Z950 Presence of cardiac pacemaker: Secondary | ICD-10-CM | POA: Diagnosis not present

## 2016-05-01 DIAGNOSIS — I2510 Atherosclerotic heart disease of native coronary artery without angina pectoris: Secondary | ICD-10-CM | POA: Diagnosis not present

## 2016-05-01 DIAGNOSIS — R531 Weakness: Secondary | ICD-10-CM | POA: Diagnosis not present

## 2016-05-01 DIAGNOSIS — J810 Acute pulmonary edema: Secondary | ICD-10-CM | POA: Diagnosis not present

## 2016-05-01 DIAGNOSIS — I5021 Acute systolic (congestive) heart failure: Secondary | ICD-10-CM | POA: Diagnosis not present

## 2016-05-01 DIAGNOSIS — I221 Subsequent ST elevation (STEMI) myocardial infarction of inferior wall: Secondary | ICD-10-CM | POA: Diagnosis not present

## 2016-05-01 DIAGNOSIS — I4891 Unspecified atrial fibrillation: Secondary | ICD-10-CM | POA: Diagnosis not present

## 2016-05-01 DIAGNOSIS — I509 Heart failure, unspecified: Secondary | ICD-10-CM | POA: Diagnosis not present

## 2016-05-01 DIAGNOSIS — M810 Age-related osteoporosis without current pathological fracture: Secondary | ICD-10-CM | POA: Diagnosis not present

## 2016-05-01 DIAGNOSIS — I442 Atrioventricular block, complete: Secondary | ICD-10-CM | POA: Diagnosis not present

## 2016-05-01 DIAGNOSIS — I481 Persistent atrial fibrillation: Secondary | ICD-10-CM | POA: Diagnosis not present

## 2016-05-01 DIAGNOSIS — I240 Acute coronary thrombosis not resulting in myocardial infarction: Secondary | ICD-10-CM | POA: Diagnosis not present

## 2016-05-01 DIAGNOSIS — I214 Non-ST elevation (NSTEMI) myocardial infarction: Secondary | ICD-10-CM | POA: Diagnosis not present

## 2016-05-01 DIAGNOSIS — R05 Cough: Secondary | ICD-10-CM | POA: Diagnosis not present

## 2016-05-01 DIAGNOSIS — R0600 Dyspnea, unspecified: Secondary | ICD-10-CM | POA: Diagnosis not present

## 2016-05-01 DIAGNOSIS — I270 Primary pulmonary hypertension: Secondary | ICD-10-CM | POA: Diagnosis not present

## 2016-05-01 LAB — CBC
HEMATOCRIT: 27 % — AB (ref 36.0–46.0)
Hemoglobin: 8.6 g/dL — ABNORMAL LOW (ref 12.0–15.0)
MCH: 32.8 pg (ref 26.0–34.0)
MCHC: 31.9 g/dL (ref 30.0–36.0)
MCV: 103.1 fL — ABNORMAL HIGH (ref 78.0–100.0)
Platelets: 109 10*3/uL — ABNORMAL LOW (ref 150–400)
RBC: 2.62 MIL/uL — ABNORMAL LOW (ref 3.87–5.11)
RDW: 15.2 % (ref 11.5–15.5)
WBC: 7.4 10*3/uL (ref 4.0–10.5)

## 2016-05-01 LAB — PROTIME-INR
INR: 1.55
PROTHROMBIN TIME: 18.7 s — AB (ref 11.4–15.2)

## 2016-05-01 LAB — GLUCOSE, CAPILLARY
GLUCOSE-CAPILLARY: 103 mg/dL — AB (ref 65–99)
GLUCOSE-CAPILLARY: 189 mg/dL — AB (ref 65–99)

## 2016-05-01 MED ORDER — CLOPIDOGREL BISULFATE 75 MG PO TABS: 75.0000 mg | ORAL_TABLET | Freq: Every day | ORAL | 6 refills | Status: DC

## 2016-05-01 MED ORDER — CIPROFLOXACIN HCL 250 MG PO TABS: 250.0000 mg | ORAL_TABLET | Freq: Two times a day (BID) | ORAL | 0 refills | Status: DC

## 2016-05-01 MED ORDER — WARFARIN SODIUM 2.5 MG PO TABS
2.5000 mg | ORAL_TABLET | Freq: Once | ORAL | Status: AC
  Administered 2016-05-01: 2.5 mg via ORAL
  Filled 2016-05-01: qty 1

## 2016-05-01 MED ORDER — ASPIRIN 81 MG PO TBEC: 81.0000 mg | DELAYED_RELEASE_TABLET | Freq: Every day | ORAL | Status: DC

## 2016-05-01 MED ORDER — ATORVASTATIN CALCIUM 40 MG PO TABS: 40.0000 mg | ORAL_TABLET | Freq: Every day | ORAL | 6 refills | Status: DC

## 2016-05-01 NOTE — Consult Note (Signed)
            [facility]igator  05/01/2016  [name] Oct 24, 1926 [id number]     Seen patient at the bedside to identify possible discharge needs but patient states she would like to use the bathroom first. Staff was notified of patient's request.  Will attempt to return back when patient is available.  For additional questions please contact:  Karin GoldenLorraine A. Danton Palmateer, BSN, RN-BC [facility] PRIMARY [facility] Navigator Cell: [phone]

## 2016-05-01 NOTE — Progress Notes (Signed)
Patient Name: Maria BrunsMaxine F Sumpter Date of Encounter: 05/01/2016  [facility]  Hospital Problem List     Principal Problem:   NSTEMI (non-ST elevated myocardial infarction) [facility](HCC) Active Problems:   ATRIAL FIBRILLATION   PACEMAKER, PERMANENT   Abdominal pain   CHB (complete heart block) (HCC) post AVN ablation   PAH (pulmonary artery hypertension)     Subjective   No CP like her presenting symptoms over a week ago.  She has back pain and agrees that she needs to get stronger.  Inpatient Medications    Scheduled Meds: . aspirin EC  81 mg Oral Daily  . atorvastatin  40 mg Oral q1800  . ciprofloxacin  250 mg Oral BID  . clopidogrel  75 mg Oral Daily  . ferrous sulfate  325 mg Oral Q breakfast  . furosemide  20 mg Oral Daily  . insulin aspart  0-9 Units Subcutaneous TID WC  . levothyroxine  75 mcg Oral QAC breakfast  . mouth rinse  15 mL Mouth Rinse BID  . polyethylene glycol  17 g Oral Daily  . potassium chloride  10 mEq Oral Daily  . sodium chloride flush  3 mL Intravenous Q12H  . Warfarin - Pharmacist Dosing Inpatient   Does not apply q1800   Continuous Infusions: . sodium chloride 10 mL/hr at 04/26/16 0535  . nitroGLYCERIN Stopped (04/25/16 1130)   PRN Meds: sodium chloride, sodium chloride, acetaminophen, diazepam, hyoscyamine, meclizine, ondansetron (ZOFRAN) IV, polyethylene glycol, sodium chloride flush, traMADol   Vital Signs    Vitals:   04/30/16 0505 04/30/16 1510 04/30/16 1944 05/01/16 0259  BP: (!) 103/50 (!) 138/56 (!) 112/49 129/61  Pulse: 70 78 70 70  Resp: 18 20 18 18   Temp: 97.4 F (36.3 C) 98.7 F (37.1 C) 99.9 F (37.7 C) 97.9 F (36.6 C)  TempSrc: Oral Oral Oral Oral  SpO2: 100% 100% 97% 100%  Weight:      Height:       No intake or output data in the 24 hours ending 05/01/16 0921 Filed Weights   04/23/16 1940 04/25/16 2020 04/28/16 2316  Weight: 150 lb (68 kg) 157 lb 11.2 oz (71.5 kg) 160 lb 12.8 oz (72.9 kg)     Physical Exam    GEN: Frail HEENT: Grossly normal.  Neck: Supple, no JVD, carotid bruits, or masses. Cardiac: RRR, ; mild bilateral LE edema.  Right radial 2+   Respiratory:  Respirations regular and unlabored, no wheezing GI: Soft, nontender, nondistended, BS + x 4. MS: no deformity or atrophy. Skin: bruising Neuro:  Strength and sensation are intact. Psych: AAOx3.  Normal affect.  Labs    CBC  Recent Labs  04/30/16 0314 05/01/16 0401  WBC 7.2 7.4  HGB 8.7* 8.6*  HCT 27.0* 27.0*  MCV 101.9* 103.1*  PLT 113* 109*   Basic Metabolic Panel  Recent Labs  04/29/16 0302  NA 137  K 4.4  CL 106  CO2 24  GLUCOSE 125*  BUN 30*  CREATININE 1.20*  CALCIUM 8.7*   Liver Function Tests No results for input(s): AST, ALT, ALKPHOS, BILITOT, PROT, ALBUMIN in the last 72 hours. No results for input(s): LIPASE, AMYLASE in the last 72 hours. Cardiac Enzymes No results for input(s): CKTOTAL, CKMB, CKMBINDEX, TROPONINI in the last 72 hours. BNP Invalid input(s): POCBNP D-Dimer No results for input(s): DDIMER in the last 72 hours. Hemoglobin A1C No results for input(s): HGBA1C in the last 72 hours. Fasting Lipid Panel No results  for input(s): CHOL, HDL, LDLCALC, TRIG, CHOLHDL, LDLDIRECT in the last 72 hours. Thyroid Function Tests No results for input(s): TSH, T4TOTAL, T3FREE, THYROIDAB in the last 72 hours.  Invalid input(s): FREET3  Telemetry    paced - Personally Reviewed  ECG    12/7 - paced  Radiology    No results found.  Cardiac Studies   Cath results  Patient Profile     82 y/o with NSTEMI- stents to LAD and circ  Assessment & Plan    1) CAD/NSTEMI: No angina.  Continue DAPT for now.  Will stop aspirin over the course of the next few weeks- sooner if there are any bleding issues.  INR still subtherapeutic.  Coumadin Used for stroke prevention in the setting of AFib.  Prior to arrival, she had not been taking her coumadin regularly.  She was only  taking it "when I needed it."    2) Biggest issue for her is getting stronger.  Likely needs rehab placement.  Will check with case manager if any progress has been made.  PT/OT as well.  [facility].  She may be more agreeable to this facility.  3) Acute systolic heart failure: Respiratory status much improved from when she first came in. Continue Lasix.  Mild anemia has been persistent as well.     Signed, Lance MussJayadeep Lashelle Koy, MD  05/01/2016, 9:21 AM

## 2016-05-01 NOTE — Progress Notes (Signed)
Clinical Social Worker met patient at bedside to offer support and discuss patients need at discharge. Patient is agreeable to SNF placement and would prefer [facility].Helene Kelp has given verbal offer to both patient and patients granddaughter Levada Dy. [facility] employee Suanne Marker spoke to patient earlier today to give her information on facility. Patient stated she would like CSW to contact granddaughter Levada Dy to see if granddaughter wants patient to discharge to [facility]. CSW remains available for support and to facilitate patient discharge needs once medically ready.   Rhea Pink, MSW,  [place]

## 2016-05-01 NOTE — Telephone Encounter (Signed)
New message   [place]  [place]

## 2016-05-01 NOTE — Progress Notes (Signed)
0900 Offered to walk with pt who declined. Stated back pain and wants to let pain med take effect. Will follow up as time permits. Luetta NuttingCharlene Dennys Traughber RN BSN 05/01/2016 9:01 AM

## 2016-05-01 NOTE — Progress Notes (Signed)
ANTICOAGULATION CONSULT NOTE - Follow Up Consult  Pharmacy Consult for Coumadin Indication: atrial fibrillation  Allergies  Allergen Reactions  . Codeine Sulfate Shortness Of Breath and Other (See Comments)    Felt funny  . Simvastatin Other (See Comments)     choking, acid reflux   Patient Measurements: Height: 5\' 4"  (162.6 cm) Weight: 160 lb 12.8 oz (72.9 kg) IBW/kg (Calculated) : 54.7  Vital Signs: Temp: 97.9 F (36.6 C) (12/11 0259) Temp Source: Oral (12/11 0259) BP: 129/61 (12/11 0259) Pulse Rate: 70 (12/11 0259)  Labs:  Recent Labs  04/29/16 0302 04/30/16 0314 05/01/16 0401  HGB 8.7* 8.7* 8.6*  HCT 27.2* 27.0* 27.0*  PLT 110* 113* 109*  LABPROT 16.0* 16.9* 18.7*  INR 1.27 1.36 1.55  CREATININE 1.20*  --   --    Estimated Creatinine Clearance: 32.3 mL/min (by C-G formula based on SCr of 1.2 mg/dL (H)).  Assessment: 89yof on coumadin pta for afib, transitioned to IV heparin on admit for cardiac procedures. Heparin was stopped post-cath on 12/5 due to drop in Hgb, she went for PCI 12/6. Coumadin resumed 12/8. INR trending up 1.27 > 1.36. Hgb and platelets low but stable at 8.7 and 113.  **Cipro added x 3 days for UTI which can potentiate INR so will need to watch for drug interaction.  Home dose: 2mg  daily except 4mg  on Wednesdays  Goal of Therapy:  INR 2-3 Monitor platelets by anticoagulation protocol: Yes   Plan:  1) Coumadin 2.5mg  x 1 2) Daily INR, CBC 3) Watch DI with cipro  Ruben Imony Samay Delcarlo, PharmD Clinical Pharmacist Pager: [phone] 05/01/2016 10:14 AM

## 2016-05-01 NOTE — Progress Notes (Signed)
Physical Therapy Treatment Patient Details Name: Maria BrunsMaxine F Holmes MRN: [id number] DOB: 21-Dec-1926 Today's Date: 05/01/2016    History of Present Illness Patient is a 82 year old female admitted for NSTEMI on 04/20/16, stent placement 04/26/16. PMH includes CHF, pacemaker, HTN, afib, hyperlipidemia, and DM.     PT Comments    Pt requires MIN A with mobility and cues for safety.  Pt with decreased short term memory and con't to recommend SNF.  Follow Up Recommendations  SNF;Supervision/Assistance - 24 hour     Equipment Recommendations  Rolling walker with 5" wheels;3in1 (PT)    Recommendations for Other Services       Precautions / Restrictions Precautions Precautions: Fall Precaution Comments: patient reports multiple falls in past year  Restrictions Weight Bearing Restrictions: No    Mobility  Bed Mobility Overal bed mobility: Needs Assistance Bed Mobility: Rolling;Sidelying to Sit Rolling: Supervision Sidelying to sit: Min assist       General bed mobility comments: MIN A to get trunk upright and for body mechanics due to back pain  Transfers Overall transfer level: Needs assistance Equipment used: Rolling walker (2 wheeled) Transfers: Sit to/from Stand Sit to Stand: Min assist         General transfer comment: stood from bed and low toilet with grab bars  Ambulation/Gait Ambulation/Gait assistance: Min assist Ambulation Distance (Feet): 120 Feet Assistive device: Rolling walker (2 wheeled) Gait Pattern/deviations: Decreased stride length;Trunk flexed Gait velocity: decreased    General Gait Details: Cues for RW placement, Weak legs noted throughout gait. Heavy reliance on UE   Stairs            Wheelchair Mobility    Modified Rankin (Stroke Patients Only)       Balance Overall balance assessment: Needs assistance       Postural control: Posterior lean Standing balance support: Bilateral upper extremity supported Standing  balance-Leahy Scale: Poor Standing balance comment: requires UE support                    Cognition Arousal/Alertness: Awake/alert Behavior During Therapy: WFL for tasks assessed/performed Overall Cognitive Status: No family/caregiver present to determine baseline cognitive functioning Area of Impairment: Problem solving     Memory: Decreased short-term memory   Safety/Judgement: Decreased awareness of safety   Problem Solving: Slow processing;Requires verbal cues      Exercises      General Comments        Pertinent Vitals/Pain Pain Assessment: Faces Faces Pain Scale: Hurts little more Pain Location: mid back Pain Descriptors / Indicators: Aching;Grimacing Pain Intervention(s): Monitored during session;Premedicated before session    Home Living                      Prior Function            PT Goals (current goals can now be found in the care plan section) Acute Rehab PT Goals Patient Stated Goal: get back to stop hurting Progress towards PT goals: Progressing toward goals    Frequency    Min 3X/week      PT Plan Current plan remains appropriate    Co-evaluation             End of Session Equipment Utilized During Treatment: Gait belt Activity Tolerance: Patient tolerated treatment well Patient left: in chair;with call bell/phone within reach;with chair alarm set     Time: 1010-1030 PT Time Calculation (min) (ACUTE ONLY): 20 min  Charges:  $Gait Training:  8-22 mins                    G Codes:      [name] 05/01/2016, 10:44 AM

## 2016-05-01 NOTE — Discharge Summary (Signed)
Discharge Summary    Patient ID: FAIGE SEELY,  MRN: [id number], DOB/AGE: Oct 31, 1926 82 y.o.  Admit date: 04/20/2016 Discharge date: 05/01/2016  Primary Care Provider: Rogelia Boga Primary Cardiologist: Dr. Amada Jupiter. Eldridge Dace  Discharge Diagnoses    Principal Problem:   NSTEMI (non-ST elevated myocardial infarction) [facility]) Active Problems:   ATRIAL FIBRILLATION   PACEMAKER, PERMANENT   Abdominal pain   CHB (complete heart block) (HCC) post AVN ablation   PAH (pulmonary artery hypertension)   Allergies Allergies  Allergen Reactions  . Codeine Sulfate Shortness Of Breath and Other (See Comments)    Felt funny  . Simvastatin Other (See Comments)     choking, acid reflux    Diagnostic Studies/Procedures    LHC: 04/20/16  Conclusions: 1. Severe 2-vessel coronary artery disease, including diffuse proximal and mi LAD disease (up to 80% in the midvessel) and thrombotic 90% stenosis of large proximal LCx with involvement of medium-caliber OM1 (90%). 2. Moderate diffuse proximal and mid RCA disease. 3. Moderately elevated left ventricular filling pressure (LVEDP 25-30 mmHg).  Recommendations: 1. Transfer to stepdown for medical optimization, including diuresis given hypoxia, orthopnea, and elevated LVEDP. 2. Restart heparin infusion 2 hours after TR band deflation. 3. Cardiac surgery consultation to evaluate for CABG versus PCI. Anatomy would be challenging due to involvement of large LCx and OM1, but staged PCI to LCx/OM1 and LAD could be performed if patient is not felt to be a reasonable CABG candidate. 4. Transthoracic echocardiogram to evaluate LV function.  The images were reviewed with Dr. Eldridge Dace, who is in agreement with the findings and plan.   TTE: 04/21/16  Study Conclusions  - Left ventricle: The cavity size was normal. Systolic function was   mildly to moderately reduced. The estimated ejection fraction was   in the range of 40% to  45%. Akinesis of the inferolateral   myocardium; consistent with infarction in the distribution of the   left circumflex coronary artery. Doppler parameters are   consistent with restrictive physiology, indicative of decreased   left ventricular diastolic compliance and/or increased left   atrial pressure. - Ventricular septum: Septal motion showed paradox. These changes   are consistent with right ventricular pacing. - Mitral valve: Calcified annulus. There was moderate regurgitation   directed centrally. - Left atrium: The atrium was moderately to severely dilated. - Right ventricle: The cavity size was moderately dilated. Systolic   function was mildly reduced. - Right atrium: The atrium was moderately to severely dilated. - Tricuspid valve: There was moderate regurgitation. - Pulmonary arteries: Systolic pressure was moderately increased.   PA peak pressure: 65 mm Hg (S).  LHC: 04/24/16  Conclusion     Prox RCA lesion, 30 %stenosed.  Mid RCA lesion, 50 %stenosed.  Dist Cx lesion, 20 %stenosed.  Ost LAD to Mid LAD lesion, 40 %stenosed.  2nd Diag lesion, 40 %stenosed.  Mid LAD to Dist LAD lesion, 90 %stenosed.  3rd Diag lesion, 70 %stenosed.  A STENT RESOLUTE ONYX 3.5X18 drug eluting stent was successfully placed.  Prox Cx lesion, 99 %stenosed.  Post intervention, there is a 0% residual stenosis.  Ost 1st Mrg to 1st Mrg lesion, 99 %stenosed.  Post intervention, there is a 5% residual stenosis.  A BALLOON [place] EUPHORA [id number] drug eluting stent was successfully placed, and overlaps previously placed stent.  Mid Cx lesion, 70 %stenosed.  Post intervention, there is a 0% residual stenosis.   Successful complex PCI to a 99% bifurcation stenosis in the proximal  dominant left circumflex vessel with 99% stenosis in the OM1 vessel with 70% mid AV groove stenosis after the second marginal vessel, treated with PTCA of the obtuse marginal 1 vessel with a 99% stenosis being  reduced to less than 5%, and ultimate tandem stenting of the proximal to mid AV groove circumflex with insertion of tandem 3.5 x 18 mm Resolute Onyx DES stents postdilated to 3.99 mm in the proximal stent and 3.80 mm in the second stent with the entire AV groove stenosis being reduced to 0% and resumption of brisk TIMI-3 flow.  RECOMMENDATION: Continue with dual antiplatelet therapy.  The patient's hemoglobin and hematocrit will need to be monitored since her hemoglobin had dropped to 9.9 prior to today's procedure, and she had mild thrombocytopenia.  She will be hydrated and following stability of renal function, consider planned staged PCI to the 90% LAD stenosis later in the week if stable   LHC: 04/26/16  Conclusion     Prox RCA lesion, 30 %stenosed.  Mid RCA lesion, 50 %stenosed.  Dist Cx lesion, 20 %stenosed.  2nd Diag lesion, 40 %stenosed.  Ost 1st Mrg to 1st Mrg lesion, 5 %stenosed.  Prox Cx lesion, 0 %stenosed.  A drug eluting .  Mid Cx lesion, 0 %stenosed.  A drug eluting .  Mid LAD to Dist LAD lesion, 90 %stenosed.  3rd Diag lesion, 70 %stenosed.  A drug eluting stent was successfully placed.  Ost LAD to Mid LAD lesion, 90 %stenosed.  Post intervention, there is a 0% residual stenosis.   1. Patent left circumflex stents. 2. Successful angioplasty and drug-eluting stent placement to the proximal LAD.  Recommendations: Gentle hydration post cath. Dual antiplatelet therapy for at least one year.    _____________   History of Present Illness     82 yo female with PMH of persistent AF s/p AV node ablation, s/p PPM, chronic diastolic HF who presented with chest pain while moving bags at home on 04/19/16. Reports chest pressure and tightness that was intermittent prompting her to call EMS. Decided to stay at home, but pain continued and she felt poorly so presented to the ED on 04/20/16. In the ED her EKG was vpaced, initial troponin 5.28. She was started on  IV heparin and admitted for further work up.   Hospital Course     Consultants: CVTS, Social Work (SNF placement)  She underwent cardiac catheterization with Dr. in showing severe 2 vessel CAD, including diffuse proximal to mid LAD disease with 80% stenosis, and thrombotic 90% stenosis of proximal left circumflex. Also noted moderate diffuse disease to proximal and mid RCA, with LVEDP 25-30. At that time the decision was made to transfer to stepdown for medical optimization, and consider cardiac surgery consultation podiatry for possible CABG versus PCI. Follow-up echo which showed systolic function was mild to moderately reduced with 40-45% EF, and akinesis of the inferior lateral myocardium consistent with infarct in the distribution of left circumflex coronary artery.   Following her initial heart she didn't decompensate requiring nonrebreather for extended period of time. That time she complained of abdominal pain and nausea. KUB showed no acute findings. She was restarted on IV heparin, and IV nitroglycerin with improvement. Also continued on IV Lasix. Randy M.D. spoke with granddaughter who is POA, and decision was made to continue 4 with PCI if determined not a good candidate for CABG. She was seen and evaluated by cardiothoracic surgery intern not a good candidate for CABG. Plan was made to continue with  option is PCI once her respiratory status improved. She was still requiring nonrebreather. Follow-up chest x-ray showed decreased edema. Her respiratory status improved over the following days and she was able to be weaned from nonrebreather.  On 12/ 4/17 she underwent left cardiac catheterization was with Dr. Tresa Endo with complex PCI to left circumflex/OM lesion with drug-eluting stent. She continued to have tight lesion in her proximal LAD, but plan for staged procedures as her GFR was 28. Hemoglobin was 9.9 post procedure, and she reported feeling well. She was given gentle IV hydration, and  creatinine remained stable. She was placed on aspirin and Plavix post procedure.  On 04/26/16 she underwent left cardiac catheterization with Dr. Kirke Corin remaining lesion. She had successful PCI to proximal LAD with drug-eluting stent placed. Her labs were closely monitored and she had a drop in her hemoglobin, and mild thrombocytopenia post previous cath. Did appear to be mildly volume overloaded and was started on 20 mg by mouth Lasix twice a day postprocedure. She was evaluated by cardiac rehabilitation who recommended PT to further evaluate discharge needs given her weakness, and independent living status.  She was seen by PT who noted decrease in balance, activity tolerance and strength and recommended SNF placement. This was discussed with both patient and her power of attorney. She was transferred to telemetry on 04/28/16. She was resumed on Coumadin on 04/28/16. Her statin dose was adjusted from 80 mg to 40 mg given her advanced age. Her Lasix was also decreased from 20 mg twice a day to daily.   Given her known persistent A. fib and need for anticoagulation, she will need to be on triple therapy for one month. Would plan to stop aspirin and continue with Plavix and Coumadin thereafter.  On 04/30/16 she did complain of left lower quadrant abdominal pain, likely related to constipation. UA did show WBCs with minimal bacteria. She was treated empirically for UTI with Cipro for the last 3 days.   Social work was consult and lives with family and power of attorney to find significant placement. She has been accepted at [facility] and plan for discharge today. She will need to continue on triple therapy with aspirin (started 04/21/16) stopped after 30 days, or sooner if any bleeding issues arise. INR still subtherapeutic will need to be followed up in the outpatient setting.   She received a bed at [facility] on 05/01/16 and accepted. She as seen and assessed by Dr. Eldridge Dace on 05/01/16 and determined stable  for discharge to SNF. I have arranged for follow up in the office with APP.  _____________  Discharge Vitals Blood pressure 129/61, pulse 70, temperature 97.9 F (36.6 C), temperature source Oral, resp. rate 18, height 5\' 4"  (1.626 m), weight 160 lb 12.8 oz (72.9 kg), SpO2 100 %.  Filed Weights   04/23/16 1940 04/25/16 2020 04/28/16 2316  Weight: 150 lb (68 kg) 157 lb 11.2 oz (71.5 kg) 160 lb 12.8 oz (72.9 kg)    Labs & Radiologic Studies    CBC  Recent Labs  04/30/16 0314 05/01/16 0401  WBC 7.2 7.4  HGB 8.7* 8.6*  HCT 27.0* 27.0*  MCV 101.9* 103.1*  PLT 113* 109*   Basic Metabolic Panel  Recent Labs  04/29/16 0302  NA 137  K 4.4  CL 106  CO2 24  GLUCOSE 125*  BUN 30*  CREATININE 1.20*  CALCIUM 8.7*   Liver Function Tests No results for input(s): AST, ALT, ALKPHOS, BILITOT, PROT, ALBUMIN in the last 72  hours. No results for input(s): LIPASE, AMYLASE in the last 72 hours. Cardiac Enzymes No results for input(s): CKTOTAL, CKMB, CKMBINDEX, TROPONINI in the last 72 hours. BNP Invalid input(s): POCBNP D-Dimer No results for input(s): DDIMER in the last 72 hours. Hemoglobin A1C No results for input(s): HGBA1C in the last 72 hours. Fasting Lipid Panel No results for input(s): CHOL, HDL, LDLCALC, TRIG, CHOLHDL, LDLDIRECT in the last 72 hours. Thyroid Function Tests No results for input(s): TSH, T4TOTAL, T3FREE, THYROIDAB in the last 72 hours.  Invalid input(s): FREET3 _____________  Dg Chest 2 View  Result Date: 04/20/2016 CLINICAL DATA:  Acute onset of generalized chest pain and anxiety. Initial encounter. EXAM: CHEST  2 VIEW COMPARISON:  Chest radiograph performed 11/22/2014 FINDINGS: The lungs are well-aerated. Vascular congestion is noted. Increased interstitial markings raise concern for mild pulmonary edema. Mild bibasilar atelectasis is seen. There is no evidence of pleural effusion or pneumothorax. The heart is mildly enlarged. A pacemaker is noted at the  left chest wall, with leads ending at the right atrium and right ventricle. No acute osseous abnormalities are seen. IMPRESSION: Vascular congestion and mild cardiomegaly. Increased interstitial markings raise concern for mild pulmonary edema. Mild bibasilar atelectasis noted. Electronically Signed   By: Roanna RaiderJeffery  Chang M.D.   On: 04/20/2016 04:35   Dg Chest Port 1 View  Result Date: 04/21/2016 CLINICAL DATA:  Shortness of breath. History of CHF, diabetes, coronary artery disease with MI. EXAM: PORTABLE CHEST 1 VIEW COMPARISON:  PA and lateral chest x-ray of April 20, 2016 FINDINGS: The lungs are well-expanded. The interstitial markings remain increased but have improved somewhat. The cardiac silhouette is enlarged. The pulmonary vascularity is mildly prominent centrally. There is calcification in the wall of the aortic arch. The ICD is in stable position. The observed bony thorax is unremarkable. IMPRESSION: Slight interval improvement in the appearance of the pulmonary interstitium suggests decreased interstitial edema. Decreased right basilar subsegmental atelectasis. Persistent mild cardiomegaly. Thoracic aortic atherosclerosis. Electronically Signed   By: David  SwazilandJordan M.D.   On: 04/21/2016 10:21   Dg Abd Portable 1v  Result Date: 04/20/2016 CLINICAL DATA:  Abdominal pain with nausea and vomiting for 3 days. EXAM: PORTABLE ABDOMEN - 1 VIEW COMPARISON:  10/07/2006 abdominal CT FINDINGS: Mild gaseous distention of the stomach. Formed stool and gas seen in the colon without obstruction or rectal impaction. No evidence of small bowel obstruction. No concerning mass effect or gas collection. Contrast is present in the bladder after recent cardiac catheterization. IMPRESSION: 1. Nonobstructive bowel gas pattern. 2. Mild gaseous distention of the stomach. Electronically Signed   By: Marnee SpringJonathon  Watts M.D.   On: 04/20/2016 16:45   Disposition   Pt is being discharged home today in good  condition.  Follow-up Plans & Appointments    Follow-up Information    Robbie LisBrittainy Simmons, PA-C Follow up on 05/09/2016.   Specialties:  Cardiology, Radiology Why:  at 9:15am for your follow up appt.  Contact information: [address] [place] [address] [phone]          Discharge Instructions    (HEART FAILURE PATIENTS) Call MD:  Anytime you have any of the following symptoms: 1) 3 pound weight gain in 24 hours or 5 pounds in 1 week 2) shortness of breath, with or without a dry hacking cough 3) swelling in the hands, feet or stomach 4) if you have to sleep on extra pillows at night in order to breathe.    Complete by:  As directed    AMB Referral to Cardiac Rehabilitation - Phase II    Complete by:  As directed    Diagnosis:   NSTEMI PTCA     Amb Referral to Cardiac Rehabilitation    Complete by:  As directed    Going to SNF. Doubtful she will be able to attend program   Diagnosis:   NSTEMI Coronary Stents     Diet - low sodium heart healthy    Complete by:  As directed    Increase activity slowly    Complete by:  As directed       Discharge Medications   Current Discharge Medication List    START taking these medications   Details  aspirin EC 81 MG EC tablet Take 1 tablet (81 mg total) by mouth daily.    atorvastatin (LIPITOR) 40 MG tablet Take 1 tablet (40 mg total) by mouth daily at 6 PM. Qty: 30 tablet, Refills: 6    ciprofloxacin (CIPRO) 250 MG tablet Take 1 tablet (250 mg total) by mouth 2 (two) times daily. Qty: 2 tablet, Refills: 0    clopidogrel (PLAVIX) 75 MG tablet Take 1 tablet (75 mg total) by mouth daily. Qty: 30 tablet, Refills: 6      CONTINUE these medications which have NOT CHANGED   Details  CARTIA XT 300 MG 24 hr capsule take 1 capsule by mouth once daily Qty: 90 capsule, Refills: 1    diazepam (VALIUM) 5 MG tablet take 1 tablet by mouth twice a day if needed Qty: 60 tablet, Refills: 2    ferrous sulfate 325 (65  FE) MG tablet take 1 tablet by mouth once daily Qty: 30 tablet, Refills: 5    furosemide (LASIX) 20 MG tablet take 1 tablet by mouth once daily Qty: 90 tablet, Refills: 1    hyoscyamine (LEVBID) 0.375 MG 12 hr tablet take 1 tablet by mouth twice a day ONLY IF NEEDED  **DO NOT TAKE EVERYDAY MUST LAST 30 DAYS** Qty: 30 tablet, Refills: 1    levothyroxine (SYNTHROID, LEVOTHROID) 75 MCG tablet take 1 tablet by mouth once daily Qty: 90 tablet, Refills: 2    metFORMIN (GLUCOPHAGE-XR) 500 MG 24 hr tablet Take 1 tablet (500 mg total) by mouth daily with breakfast. Qty: 90 tablet, Refills: 3    polyethylene glycol powder (GLYCOLAX/MIRALAX) powder Take 1 Container by mouth daily as needed (constipation).    warfarin (COUMADIN) 2 MG tablet Take as directed by anticoagulation clinic Qty: 105 tablet, Refills: 1    potassium chloride (K-DUR,KLOR-CON) 10 MEQ tablet take 1 tablet by mouth once daily Qty: 30 tablet, Refills: 5    traMADol (ULTRAM) 50 MG tablet Take 1 tablet (50 mg total) by mouth every 6 (six) hours as needed for moderate pain. Qty: 60 tablet, Refills: 0      STOP taking these medications     telmisartan (MICARDIS) 80 MG tablet      meclizine (ANTIVERT) 25 MG tablet          Aspirin prescribed at discharge?  Yes High Intensity Statin Prescribed? (Lipitor 40-80mg  or Crestor 20-40mg ): Yes Beta Blocker Prescribed? No: Consider in outpatient setting For EF <40%, was ACEI/ARB Prescribed? No: Consider adding back in outpatient setting if Cr stable. ADP Receptor Inhibitor Prescribed? (i.e. Plavix etc.-Includes Medically Managed Patients): Yes For EF <40%, Aldosterone Inhibitor Prescribed? No: EF ok Was EF assessed during THIS hospitalization? Yes Was Cardiac Rehab II ordered? (Included Medically managed Patients): Yes   Outstanding Labs/Studies  BMET at follow up visit. INR follow up at SNF  Duration of Discharge Encounter   Greater than 30 minutes including physician  time.  Signed, Laverda PageLindsay Roberts NP-C 05/01/2016, 2:43 PM   I have examined the patient and reviewed assessment and plan and discussed with patient.  Agree with above as stated.  CAD/NSTEMI: No angina.  Continue DAPT for now.  Will stop aspirin over the course of the next few weeks- sooner if there are any bleeding issues.  INR still subtherapeutic.  Coumadin Used for stroke prevention in the setting of AFib.  Prior to arrival, she had not been taking her coumadin regularly.  She was only taking it "when I needed it."     Biggest issue for her is getting stronger.  Rehab will help.  INR will need to be followed.  Lance MussJayadeep Janayah Zavada

## 2016-05-01 NOTE — Progress Notes (Signed)
Clinical Social Worker facilitated patient discharge including contacting patient family and facility to confirm patient discharge plans.  Clinical information faxed to facility and family agreeable with plan.CSW had patient signed paperwork faxed over from [facility]. Patient understood and signed paperwork to be faxed over to facility.  CSW arranged ambulance transport via [facility] to [facility] .  RN Marchelle Folksmanda to call report at [phone]) prior to discharge.  Clinical Social Worker will sign off for now as social work intervention is no longer needed. Please consult us again if new need arises.  Marrianne MoodAshley Kaitlin Ardito, MSW, [employer]CSWA [phone]

## 2016-05-01 NOTE — Care Management Important Message (Signed)
Important Message  Patient Details  Name: Ventura BrunsMaxine F Essick MRN: [id number] Date of Birth: [birth year]   Medicare Important Message Given:  Yes    Kyla BalzarineShealy, Markisha Meding Abena 05/01/2016, 9:57 AM

## 2016-05-01 NOTE — Consult Note (Signed)
            [facility] Primary [facility] Navigator  05/01/2016  [name] 1926/07/15 [id number]  Went back to see patient at the bedside to identify possible discharge needs. Patient reports that she developed abdominal pain and chest pressure/ tightness that was intermittent which led to this admission/surgery.  Patient endorses Dr. Amador CunasKwiatkowski [facility] as her primary care provider.    Patient shared using [facility] pharmacy in [place] to obtain medications without any problem.  Patient states managing her medications at home using "pill box" system.   She lives alone and reports being able to drive prior to admission. She states not being sure who among her family members will be able provide transportation to her doctors' appointments when discharged from skilled nursing facility. Patient states that she is hoping that by the time she discharges from SNF, she is strong enough to drive for herself as stated.  Patient is the primary caregiver for herself at home as reported.   Discharge plan is skilled nursing facility [facility] for short term rehabilitation per patient.  Patient voiced understanding to call primary care provider's office when she returns home, for a post discharge follow-up appointment within a week or sooner if needs arise. Patient letter provided as a reminder.  [facility] for care coordination needs and disease management/ education. [facility] care [facility] contact information provided for future needs that may arise.  For additional questions please contact:  Karin GoldenLorraine A. Josefita Weissmann, BSN, RN-BC [facility] PRIMARY CARE Navigator Cell: [phone]

## 2016-05-01 NOTE — Care Management Note (Signed)
Case Management Note Donn PieriniKristi Kumar Falwell RN, BSN Unit 2W-Case Manager [phone]  Patient Details  Name: Ventura BrunsMaxine F Linville MRN: [id number] Date of Birth: [birth year]  Subjective/Objective:  Admitted with NSTEMI, s/p stentsx3                  Action/Plan: PTA pt lived at home, not a surgical candidate- recommendations for SNF- CSW consulted for placement needs.   Expected Discharge Date:    05/01/16              Expected Discharge Plan:  Skilled Nursing Facility  In-House Referral:  Clinical Social Work  Discharge planning Services  CM Consult  Post Acute Care Choice:    Choice offered to:     DME Arranged:    DME Agency:     HH Arranged:    HH Agency:     Status of Service:  Completed, signed off  If discussed at [employer] Length of Stay Meetings, dates discussed:    Discharge Disposition: skilled facility   Additional Comments:  Darrold SpanWebster, Jordie Skalsky Hall, RN 05/01/2016, 4:54 PM

## 2016-05-02 DIAGNOSIS — I481 Persistent atrial fibrillation: Secondary | ICD-10-CM | POA: Diagnosis not present

## 2016-05-02 DIAGNOSIS — I214 Non-ST elevation (NSTEMI) myocardial infarction: Secondary | ICD-10-CM | POA: Diagnosis not present

## 2016-05-02 DIAGNOSIS — I2510 Atherosclerotic heart disease of native coronary artery without angina pectoris: Secondary | ICD-10-CM | POA: Diagnosis not present

## 2016-05-02 DIAGNOSIS — E119 Type 2 diabetes mellitus without complications: Secondary | ICD-10-CM | POA: Diagnosis not present

## 2016-05-02 NOTE — Telephone Encounter (Signed)
Attempted to call patient.  Patient d/c'd to SNF.

## 2016-05-03 ENCOUNTER — Encounter: Payer: Self-pay | Admitting: Cardiology

## 2016-05-04 DIAGNOSIS — I2510 Atherosclerotic heart disease of native coronary artery without angina pectoris: Secondary | ICD-10-CM | POA: Diagnosis not present

## 2016-05-04 DIAGNOSIS — R0600 Dyspnea, unspecified: Secondary | ICD-10-CM | POA: Diagnosis not present

## 2016-05-04 DIAGNOSIS — I481 Persistent atrial fibrillation: Secondary | ICD-10-CM | POA: Diagnosis not present

## 2016-05-04 DIAGNOSIS — I214 Non-ST elevation (NSTEMI) myocardial infarction: Secondary | ICD-10-CM | POA: Diagnosis not present

## 2016-05-05 DIAGNOSIS — I2510 Atherosclerotic heart disease of native coronary artery without angina pectoris: Secondary | ICD-10-CM | POA: Diagnosis not present

## 2016-05-05 DIAGNOSIS — I270 Primary pulmonary hypertension: Secondary | ICD-10-CM | POA: Diagnosis not present

## 2016-05-05 DIAGNOSIS — Z950 Presence of cardiac pacemaker: Secondary | ICD-10-CM | POA: Diagnosis not present

## 2016-05-05 DIAGNOSIS — I4891 Unspecified atrial fibrillation: Secondary | ICD-10-CM | POA: Diagnosis not present

## 2016-05-05 DIAGNOSIS — I509 Heart failure, unspecified: Secondary | ICD-10-CM | POA: Diagnosis not present

## 2016-05-09 ENCOUNTER — Telehealth: Payer: Self-pay | Admitting: Cardiology

## 2016-05-09 ENCOUNTER — Ambulatory Visit (INDEPENDENT_AMBULATORY_CARE_PROVIDER_SITE_OTHER): Payer: Medicare Other | Admitting: Cardiology

## 2016-05-09 ENCOUNTER — Encounter: Payer: Self-pay | Admitting: Cardiology

## 2016-05-09 VITALS — BP 110/60 | HR 80 | Ht 64.0 in | Wt 161.5 lb

## 2016-05-09 DIAGNOSIS — I442 Atrioventricular block, complete: Secondary | ICD-10-CM

## 2016-05-09 DIAGNOSIS — I4821 Permanent atrial fibrillation: Secondary | ICD-10-CM

## 2016-05-09 DIAGNOSIS — I482 Chronic atrial fibrillation: Secondary | ICD-10-CM | POA: Diagnosis not present

## 2016-05-09 DIAGNOSIS — I214 Non-ST elevation (NSTEMI) myocardial infarction: Secondary | ICD-10-CM

## 2016-05-09 LAB — CBC WITH DIFFERENTIAL/PLATELET
BASOS PCT: 0 %
Basophils Absolute: 0 cells/uL (ref 0–200)
EOS PCT: 1 %
Eosinophils Absolute: 63 cells/uL (ref 15–500)
HCT: 33 % — ABNORMAL LOW (ref 35.0–45.0)
Hemoglobin: 10.3 g/dL — ABNORMAL LOW (ref 11.7–15.5)
LYMPHS ABS: 1323 {cells}/uL (ref 850–3900)
Lymphocytes Relative: 21 %
MCH: 31.6 pg (ref 27.0–33.0)
MCHC: 31.2 g/dL — AB (ref 32.0–36.0)
MCV: 101.2 fL — ABNORMAL HIGH (ref 80.0–100.0)
MONO ABS: 441 {cells}/uL (ref 200–950)
MPV: 9.6 fL (ref 7.5–12.5)
Monocytes Relative: 7 %
NEUTROS PCT: 71 %
Neutro Abs: 4473 cells/uL (ref 1500–7800)
PLATELETS: 185 10*3/uL (ref 140–400)
RBC: 3.26 MIL/uL — AB (ref 3.80–5.10)
RDW: 16.2 % — AB (ref 11.0–15.0)
WBC: 6.3 10*3/uL (ref 3.8–10.8)

## 2016-05-09 LAB — BASIC METABOLIC PANEL
BUN: 16 mg/dL (ref 7–25)
CHLORIDE: 101 mmol/L (ref 98–110)
CO2: 31 mmol/L (ref 20–31)
Calcium: 8.6 mg/dL (ref 8.6–10.4)
Creat: 1.19 mg/dL — ABNORMAL HIGH (ref 0.60–0.88)
Glucose, Bld: 136 mg/dL — ABNORMAL HIGH (ref 65–99)
POTASSIUM: 3.6 mmol/L (ref 3.5–5.3)
SODIUM: 141 mmol/L (ref 135–146)

## 2016-05-09 LAB — PROTIME-INR
INR: 1.5 — AB
PROTHROMBIN TIME: 15.5 s — AB (ref 9.0–11.5)

## 2016-05-09 NOTE — Telephone Encounter (Signed)
I attempted to contact [place], held for more than 5 minutes, the call was never picked up.

## 2016-05-09 NOTE — Patient Instructions (Addendum)
Medication Instructions:  On January 7,2018 stop aspirin.  Continue taking clopidogrel and coumadin (warfarin).  Labwork: BMET/CBCd/PT/INR today  Testing/Procedures: none  Follow-Up: Your physician recommends that you schedule a follow-up appointment end of February with Dr Eldridge DaceVaranasi.   Any Other Special Instructions Will Be Listed Below (If Applicable).     If you need a refill on your cardiac medications before your next appointment, please call your pharmacy.

## 2016-05-09 NOTE — Telephone Encounter (Signed)
Follow Up:    Please call,concerning that was written today. Pt saw [place]ada[place]ittany Simmons,PA,this morning.

## 2016-05-09 NOTE — Telephone Encounter (Signed)
Spoke with [facility]-confirmed pt to stop aspirin May 28, 2016 continue clopidogrel and warfarin (coumadin).

## 2016-05-09 NOTE — Progress Notes (Signed)
05/09/2016 Kerrie BuffaloMaxine F Sellinger   06-05-1926  [id number]  Primary Physician Rogelia BogaKWIATKOWSKI,PETER FRANK, MD Primary Cardiologist: Dr. Eldridge DaceVaranasi  Electrophysiologist: Dr. Ladona Ridgelaylor   Reason for Visit/CC: [facility] f[facility] for CAD s/p NSTEMI  HPI:  Ms. Manuela NeptuneMalabre presents to clinic today for post hospital f/u. She is a 82 y/o female, followed by Dr. Eldridge DaceVaranasi and Dr. Ladona Ridgelaylor. She has a h/o atrial fibrillation s/p AV node ablation and  PPM and chronic diastolic dysfunction who recently [facility] with complaint of chest pain and ruled in for NSTEMI with troponin peaking at 5.28. Subsequent LHC demonstrated severe 2 vessel CAD, including diffuse proximal to mid LAD disease with 80% stenosis, and thrombotic 90% stenosis of proximal left circumflex. Also noted moderate diffuse disease to proximal and mid RCA, with LVEDP 25-30. She was turned down for CABG. Not felt to be a candidate for surgical revascularization. PCI was recommended. She underwent staged PCI of her left circumflex/OM lesion with drug-eluting stent, per Dr. Tresa EndoKelly on 12/4 and PCI of her proximal LAD with drug-eluting stent per Dr. Kirke CorinArida on 04/26/16. Given her persistent atrial fibrillation, decision was made to treat with triple therapy x 1 month with ASA, Plavix and Coumadin with plans to stop ASA after 1 month. Her labs were closely monitored and she had a drop in her hemoglobin, and mild thrombocytopenia. She was also started on 20 mg of Lasix for volume overload. Lipitor 80 mg also added. It was recommended that she go to a SNF for rehab prior to going home.   She presents back to clinic today for f/u. She reports that she is doing well. No chest pain. No dyspnea or edema. Rehab is going ok at SNF. VSS. Of note, EF was 40-45% on recent echo.   Current Meds  Medication Sig  . aspirin EC 81 MG EC tablet Take 1 tablet (81 mg total) by mouth daily.  Marland Kitchen. atorvastatin (LIPITOR) 40 MG tablet Take 1 tablet (40 mg total) by mouth daily at 6 PM.  .  CARTIA XT 300 MG 24 hr capsule take 1 capsule by mouth once daily  . clopidogrel (PLAVIX) 75 MG tablet Take 1 tablet (75 mg total) by mouth daily.  . diazepam (VALIUM) 5 MG tablet take 1 tablet by mouth twice a day if needed  . ferrous sulfate 325 (65 FE) MG tablet take 1 tablet by mouth once daily  . furosemide (LASIX) 20 MG tablet take 1 tablet by mouth once daily  . hyoscyamine (LEVBID) 0.375 MG 12 hr tablet take 1 tablet by mouth twice a day ONLY IF NEEDED  **DO NOT TAKE EVERYDAY MUST LAST 30 DAYS**  . levothyroxine (SYNTHROID, LEVOTHROID) 75 MCG tablet take 1 tablet by mouth once daily  . metFORMIN (GLUCOPHAGE-XR) 500 MG 24 hr tablet Take 1 tablet (500 mg total) by mouth daily with breakfast.  . potassium chloride (K-DUR,KLOR-CON) 10 MEQ tablet take 1 tablet by mouth once daily  . traMADol (ULTRAM) 50 MG tablet Take 1 tablet (50 mg total) by mouth every 6 (six) hours as needed for moderate pain.  Marland Kitchen. warfarin (COUMADIN) 2 MG tablet Take as directed by anticoagulation clinic   Allergies  Allergen Reactions  . Codeine Sulfate Shortness Of Breath and Other (See Comments)    Felt funny  . Simvastatin Other (See Comments)     choking, acid reflux   Past Medical History:  Diagnosis Date  . Anxiety   . Atrial fibrillation (HCC)   . Atrial fibrillation (HCC)   .  CHF (congestive heart failure) (HCC)   . Constipation   . Diverticulosis of colon   . DM (diabetes mellitus) (HCC)   . Hyperlipidemia   . Hypertension   . Hypothyroidism   . Osteoporosis   . Pain in left ear   . Thyroid disease   . Tremor, essential   . Type II or unspecified type diabetes mellitus with neurological manifestations, not stated as uncontrolled(250.60)    Family History  Problem Relation Age of Onset  . Heart attack Father   . Colon cancer Neg Hx    Past Surgical History:  Procedure Laterality Date  . CARDIAC CATHETERIZATION N/A 04/20/2016   Procedure: Left Heart Cath and Coronary Angiography;  Surgeon:  Yvonne Kendallhristopher End, MD;  [facility];  Service: Cardiovascular;  Laterality: N/A;  . CARDIAC CATHETERIZATION N/A 04/24/2016   Procedure: Coronary Stent Intervention;  Surgeon: Lennette Biharihomas A Kelly, MD;  Location: [facility] INVASIVE CV LAB;  Service: Cardiovascular;  Laterality: N/A;  . CARDIAC CATHETERIZATION N/A 04/24/2016   Procedure: Coronary Balloon Angioplasty;  Surgeon: Lennette Biharihomas A Kelly, MD;  Location: [facility] INVASIVE CV LAB;  Service: Cardiovascular;  Laterality: N/A;  . CARDIAC CATHETERIZATION N/A 04/26/2016   Procedure: Coronary Stent Intervention;  Surgeon: Iran OuchMuhammad A Arida, MD;  Location: [facility] INVASIVE CV LAB;  Service: Cardiovascular;  Laterality: N/A;  . CATARACT EXTRACTION  2002  . L3 compression fraction    . PACEMAKER PLACEMENT    . TRANSTHORACIC ECHOCARDIOGRAM  2011, 2012   Social History   Social History  . Marital status: Widowed    Spouse name: N/A  . Number of children: N/A  . Years of education: N/A   Occupational History  . Not on file.   Social History Main Topics  . Smoking status: Never Smoker  . Smokeless tobacco: Never Used  . Alcohol use No  . Drug use: No  . Sexual activity: Not on file   Other Topics Concern  . Not on file   Social History Narrative  . No narrative on file     Review of Systems: General: negative for chills, fever, night sweats or weight changes.  Cardiovascular: negative for chest pain, dyspnea on exertion, edema, orthopnea, palpitations, paroxysmal nocturnal dyspnea or shortness of breath Dermatological: negative for rash Respiratory: negative for cough or wheezing Urologic: negative for hematuria Abdominal: negative for nausea, vomiting, diarrhea, bright red blood per rectum, melena, or hematemesis Neurologic: negative for visual changes, syncope, or dizziness All other systems reviewed and are otherwise negative except as noted above.   Physical Exam:  Blood pressure 110/60, pulse 80, height 5\' 4"  (1.626 m), weight 161 lb 8 oz  (73.3 kg).  General appearance: alert, cooperative and no distress Neck: no carotid bruit and no JVD Lungs: clear to auscultation bilaterally Heart: regular rate and rhythm, S1, S2 normal, no murmur, click, rub or gallop Extremities: extremities normal, atraumatic, no cyanosis or edema Pulses: 2+ and symmetric Skin: Skin color, texture, turgor normal. No rashes or lesions Neurologic: Grossly normal  EKG LBBB, 80 bpm paced   ASSESSMENT AND PLAN:   1. CAD: recent NSTEMI and PCI  of her left circumflex/OM lesion with drug-eluting stent and PCI of her proximal LAD with drug-eluting stent 12/4 and 12/6 . Plan is for 1 month of triple therapy. Starting 05/28/16, she can d/c her ASA and continue Plavix and Coumadin. Continue high dose statin therapy with Lipitor.   2. Atrial Fibrillation: s/p AV node ablation with PPM. HR is stable in the  9s. She is on Coumadin for stroke prophylaxis. Will check INR today. Will also check a CBC given recent anemia.   3. Combined Systolic + Diastolic CHF:  EF 16-10%. Volume is stable with PO Lasix. No dyspnea. Will check a f/u BMP today to check renal function and K.   4. PPM: followed by Dr. Ladona Ridgel.    PLAN  Continue rehab at SNF. F/u with Dr. Eldridge Dace in 2-3 months.   Robbie Lis PA-C 05/09/2016 9:59 AM

## 2016-05-09 NOTE — Telephone Encounter (Signed)
[facility] attempted to contact [facility], I [facility] than 6 minutes [facility], the call was never picked up.

## 2016-05-09 NOTE — Telephone Encounter (Signed)
Follow Up:   Calling back again.

## 2016-05-11 ENCOUNTER — Ambulatory Visit (INDEPENDENT_AMBULATORY_CARE_PROVIDER_SITE_OTHER): Payer: Medicare Other | Admitting: General Practice

## 2016-05-11 DIAGNOSIS — Z5181 Encounter for therapeutic drug level monitoring: Secondary | ICD-10-CM

## 2016-05-11 DIAGNOSIS — Z7901 Long term (current) use of anticoagulants: Secondary | ICD-10-CM

## 2016-05-11 NOTE — Patient Instructions (Addendum)
Pre visit review using our clinic review tool, if applicable. No additional management support is needed unless otherwise documented below in the visit note. Patient is in rehab at [name] at the present time.  Patient's Grand-daughter will contact Bailey Mechindy Boyd, RN when patient is discharged.

## 2016-05-11 NOTE — Progress Notes (Signed)
I have reviewed and agree with the plan. 

## 2016-05-12 ENCOUNTER — Telehealth: Payer: Self-pay | Admitting: Internal Medicine

## 2016-05-12 NOTE — Telephone Encounter (Signed)
Pts granddaughter calling to see if the office will fax a Rx for the pt to get a wheelchair from [facility] [phone]

## 2016-05-12 NOTE — Progress Notes (Signed)
I spoke with patient's grandaughter, Angie.  Patient is in [name] at the present time.  Angie will call me when patient's is discharged to have her INR checked.

## 2016-05-14 DIAGNOSIS — I110 Hypertensive heart disease with heart failure: Secondary | ICD-10-CM | POA: Diagnosis not present

## 2016-05-14 DIAGNOSIS — I2510 Atherosclerotic heart disease of native coronary artery without angina pectoris: Secondary | ICD-10-CM | POA: Diagnosis not present

## 2016-05-14 DIAGNOSIS — Z955 Presence of coronary angioplasty implant and graft: Secondary | ICD-10-CM | POA: Diagnosis not present

## 2016-05-14 DIAGNOSIS — I509 Heart failure, unspecified: Secondary | ICD-10-CM | POA: Diagnosis not present

## 2016-05-14 DIAGNOSIS — R0682 Tachypnea, not elsewhere classified: Secondary | ICD-10-CM | POA: Diagnosis not present

## 2016-05-14 DIAGNOSIS — Z7902 Long term (current) use of antithrombotics/antiplatelets: Secondary | ICD-10-CM | POA: Diagnosis not present

## 2016-05-14 DIAGNOSIS — I482 Chronic atrial fibrillation: Secondary | ICD-10-CM | POA: Diagnosis not present

## 2016-05-14 DIAGNOSIS — Z950 Presence of cardiac pacemaker: Secondary | ICD-10-CM | POA: Diagnosis not present

## 2016-05-14 DIAGNOSIS — E039 Hypothyroidism, unspecified: Secondary | ICD-10-CM | POA: Diagnosis not present

## 2016-05-14 DIAGNOSIS — Z7901 Long term (current) use of anticoagulants: Secondary | ICD-10-CM | POA: Diagnosis not present

## 2016-05-14 DIAGNOSIS — E119 Type 2 diabetes mellitus without complications: Secondary | ICD-10-CM | POA: Diagnosis not present

## 2016-05-14 DIAGNOSIS — Z888 Allergy status to other drugs, medicaments and biological substances status: Secondary | ICD-10-CM | POA: Diagnosis not present

## 2016-05-14 DIAGNOSIS — Z86718 Personal history of other venous thrombosis and embolism: Secondary | ICD-10-CM | POA: Diagnosis not present

## 2016-05-14 DIAGNOSIS — Z79899 Other long term (current) drug therapy: Secondary | ICD-10-CM | POA: Diagnosis not present

## 2016-05-14 DIAGNOSIS — Z7982 Long term (current) use of aspirin: Secondary | ICD-10-CM | POA: Diagnosis not present

## 2016-05-14 DIAGNOSIS — M810 Age-related osteoporosis without current pathological fracture: Secondary | ICD-10-CM | POA: Diagnosis not present

## 2016-05-14 DIAGNOSIS — R0602 Shortness of breath: Secondary | ICD-10-CM | POA: Diagnosis not present

## 2016-05-14 DIAGNOSIS — I214 Non-ST elevation (NSTEMI) myocardial infarction: Secondary | ICD-10-CM | POA: Diagnosis not present

## 2016-05-14 DIAGNOSIS — Z885 Allergy status to narcotic agent status: Secondary | ICD-10-CM | POA: Diagnosis not present

## 2016-05-14 DIAGNOSIS — I272 Pulmonary hypertension, unspecified: Secondary | ICD-10-CM | POA: Diagnosis not present

## 2016-05-14 DIAGNOSIS — I5041 Acute combined systolic (congestive) and diastolic (congestive) heart failure: Secondary | ICD-10-CM | POA: Diagnosis not present

## 2016-05-17 ENCOUNTER — Other Ambulatory Visit: Payer: Self-pay | Admitting: *Deleted

## 2016-05-17 NOTE — Patient Outreach (Signed)
Spoke with Dewitt RotaMelissa Vickers, SW at facility.  Patient has discharged to Assisted living Facility. [facility]. Plan to sign off case. Alben SpittleMary E. Dusan Lipford, RN, BSN, CCM  [facility] Coordination [phone]

## 2016-05-18 ENCOUNTER — Other Ambulatory Visit: Payer: Self-pay | Admitting: Internal Medicine

## 2016-05-22 NOTE — Telephone Encounter (Signed)
Okay to fax prescription

## 2016-05-23 NOTE — Telephone Encounter (Signed)
Rx printed, signed and faxed over to [facility].

## 2016-05-24 ENCOUNTER — Other Ambulatory Visit: Payer: Self-pay | Admitting: Internal Medicine

## 2016-05-24 ENCOUNTER — Ambulatory Visit: Payer: Medicare Other | Admitting: General Practice

## 2016-05-24 DIAGNOSIS — Z5181 Encounter for therapeutic drug level monitoring: Secondary | ICD-10-CM

## 2016-05-24 DIAGNOSIS — Z7901 Long term (current) use of anticoagulants: Secondary | ICD-10-CM

## 2016-05-24 DIAGNOSIS — B3749 Other urogenital candidiasis: Secondary | ICD-10-CM

## 2016-05-24 LAB — POC URINALSYSI DIPSTICK (AUTOMATED)
Bilirubin, UA: NEGATIVE
Glucose, UA: NEGATIVE
KETONES UA: NEGATIVE
Nitrite, UA: NEGATIVE
SPEC GRAV UA: 1.015
Urobilinogen, UA: 1
pH, UA: 7

## 2016-05-24 LAB — POCT INR: INR: 7.6

## 2016-05-24 MED ORDER — NITROFURANTOIN MACROCRYSTAL 50 MG PO CAPS: 50.0000 mg | ORAL_CAPSULE | Freq: Two times a day (BID) | ORAL | 0 refills | Status: AC

## 2016-05-24 NOTE — Patient Instructions (Addendum)
Pre visit review using our clinic review tool, if applicable. No additional management support is needed unless otherwise documented below in the visit note. INR is very high today.  [facility] and has been receiving 3 mg of coumadin daily.  I stopped coumadin through Sunday.  Patient will have INR re-checked on Monday.  Instructed patient and Grandaughter to ER if any unusual bleeding occurs.  Patient and Grandaughter verbalized understanding.

## 2016-05-25 ENCOUNTER — Encounter: Payer: Self-pay | Admitting: Internal Medicine

## 2016-05-25 DIAGNOSIS — R262 Difficulty in walking, not elsewhere classified: Secondary | ICD-10-CM | POA: Diagnosis not present

## 2016-05-25 DIAGNOSIS — E1149 Type 2 diabetes mellitus with other diabetic neurological complication: Secondary | ICD-10-CM | POA: Diagnosis not present

## 2016-05-25 DIAGNOSIS — I509 Heart failure, unspecified: Secondary | ICD-10-CM | POA: Diagnosis not present

## 2016-05-25 DIAGNOSIS — R269 Unspecified abnormalities of gait and mobility: Secondary | ICD-10-CM | POA: Diagnosis not present

## 2016-05-25 DIAGNOSIS — I482 Chronic atrial fibrillation: Secondary | ICD-10-CM | POA: Diagnosis not present

## 2016-05-26 DIAGNOSIS — I509 Heart failure, unspecified: Secondary | ICD-10-CM | POA: Diagnosis not present

## 2016-05-26 DIAGNOSIS — E1149 Type 2 diabetes mellitus with other diabetic neurological complication: Secondary | ICD-10-CM | POA: Diagnosis not present

## 2016-05-26 DIAGNOSIS — R269 Unspecified abnormalities of gait and mobility: Secondary | ICD-10-CM | POA: Diagnosis not present

## 2016-05-26 DIAGNOSIS — R262 Difficulty in walking, not elsewhere classified: Secondary | ICD-10-CM | POA: Diagnosis not present

## 2016-05-26 DIAGNOSIS — I482 Chronic atrial fibrillation: Secondary | ICD-10-CM | POA: Diagnosis not present

## 2016-05-29 ENCOUNTER — Ambulatory Visit (INDEPENDENT_AMBULATORY_CARE_PROVIDER_SITE_OTHER): Payer: Medicare Other | Admitting: General Practice

## 2016-05-29 ENCOUNTER — Ambulatory Visit: Payer: Medicare Other

## 2016-05-29 DIAGNOSIS — E1149 Type 2 diabetes mellitus with other diabetic neurological complication: Secondary | ICD-10-CM | POA: Diagnosis not present

## 2016-05-29 DIAGNOSIS — Z5181 Encounter for therapeutic drug level monitoring: Secondary | ICD-10-CM

## 2016-05-29 DIAGNOSIS — R269 Unspecified abnormalities of gait and mobility: Secondary | ICD-10-CM | POA: Diagnosis not present

## 2016-05-29 DIAGNOSIS — Z7901 Long term (current) use of anticoagulants: Secondary | ICD-10-CM

## 2016-05-29 DIAGNOSIS — I509 Heart failure, unspecified: Secondary | ICD-10-CM | POA: Diagnosis not present

## 2016-05-29 DIAGNOSIS — R262 Difficulty in walking, not elsewhere classified: Secondary | ICD-10-CM | POA: Diagnosis not present

## 2016-05-29 DIAGNOSIS — I482 Chronic atrial fibrillation: Secondary | ICD-10-CM | POA: Diagnosis not present

## 2016-05-29 LAB — POCT INR: INR: 1.9

## 2016-05-29 NOTE — Patient Instructions (Signed)
Pre visit review using our clinic review tool, if applicable. No additional management support is needed unless otherwise documented below in the visit note. 

## 2016-05-30 ENCOUNTER — Other Ambulatory Visit: Payer: Self-pay | Admitting: Internal Medicine

## 2016-05-30 DIAGNOSIS — R269 Unspecified abnormalities of gait and mobility: Secondary | ICD-10-CM | POA: Diagnosis not present

## 2016-05-30 DIAGNOSIS — I482 Chronic atrial fibrillation: Secondary | ICD-10-CM | POA: Diagnosis not present

## 2016-05-30 DIAGNOSIS — E1149 Type 2 diabetes mellitus with other diabetic neurological complication: Secondary | ICD-10-CM | POA: Diagnosis not present

## 2016-05-30 DIAGNOSIS — I509 Heart failure, unspecified: Secondary | ICD-10-CM | POA: Diagnosis not present

## 2016-05-30 DIAGNOSIS — R262 Difficulty in walking, not elsewhere classified: Secondary | ICD-10-CM | POA: Diagnosis not present

## 2016-05-30 NOTE — Progress Notes (Signed)
I agree with this plan.

## 2016-05-31 DIAGNOSIS — E1149 Type 2 diabetes mellitus with other diabetic neurological complication: Secondary | ICD-10-CM | POA: Diagnosis not present

## 2016-05-31 DIAGNOSIS — R269 Unspecified abnormalities of gait and mobility: Secondary | ICD-10-CM | POA: Diagnosis not present

## 2016-05-31 DIAGNOSIS — R262 Difficulty in walking, not elsewhere classified: Secondary | ICD-10-CM | POA: Diagnosis not present

## 2016-05-31 DIAGNOSIS — I509 Heart failure, unspecified: Secondary | ICD-10-CM | POA: Diagnosis not present

## 2016-05-31 DIAGNOSIS — I482 Chronic atrial fibrillation: Secondary | ICD-10-CM | POA: Diagnosis not present

## 2016-06-02 ENCOUNTER — Encounter: Payer: Self-pay | Admitting: Internal Medicine

## 2016-06-02 ENCOUNTER — Ambulatory Visit (INDEPENDENT_AMBULATORY_CARE_PROVIDER_SITE_OTHER): Payer: Medicare Other | Admitting: General Practice

## 2016-06-02 ENCOUNTER — Telehealth (HOSPITAL_COMMUNITY): Payer: Self-pay | Admitting: Internal Medicine

## 2016-06-02 DIAGNOSIS — Z5181 Encounter for therapeutic drug level monitoring: Secondary | ICD-10-CM

## 2016-06-02 DIAGNOSIS — Z7901 Long term (current) use of anticoagulants: Secondary | ICD-10-CM

## 2016-06-02 DIAGNOSIS — I509 Heart failure, unspecified: Secondary | ICD-10-CM | POA: Diagnosis not present

## 2016-06-02 DIAGNOSIS — E1149 Type 2 diabetes mellitus with other diabetic neurological complication: Secondary | ICD-10-CM | POA: Diagnosis not present

## 2016-06-02 DIAGNOSIS — R262 Difficulty in walking, not elsewhere classified: Secondary | ICD-10-CM | POA: Diagnosis not present

## 2016-06-02 DIAGNOSIS — R269 Unspecified abnormalities of gait and mobility: Secondary | ICD-10-CM | POA: Diagnosis not present

## 2016-06-02 DIAGNOSIS — I482 Chronic atrial fibrillation: Secondary | ICD-10-CM | POA: Diagnosis not present

## 2016-06-02 LAB — POCT INR: INR: 2.1

## 2016-06-02 NOTE — Telephone Encounter (Signed)
pls see attached msg

## 2016-06-02 NOTE — Patient Instructions (Signed)
Pre visit review using our clinic review tool, if applicable. No additional management support is needed unless otherwise documented below in the visit note. 

## 2016-06-02 NOTE — Telephone Encounter (Signed)
[facility] [employer] information.  [phone] co-pay, no Deductible, $4,400 out of pocket per year with no limits, reference #2920.Marland Kitchen.... KJ

## 2016-06-02 NOTE — Progress Notes (Signed)
I have reviewed and agree with the plan. 

## 2016-06-05 ENCOUNTER — Other Ambulatory Visit: Payer: Self-pay | Admitting: General Practice

## 2016-06-05 ENCOUNTER — Ambulatory Visit: Payer: Medicare Other | Admitting: Internal Medicine

## 2016-06-05 ENCOUNTER — Telehealth: Payer: Self-pay | Admitting: Cardiology

## 2016-06-05 ENCOUNTER — Telehealth: Payer: Self-pay | Admitting: Internal Medicine

## 2016-06-05 ENCOUNTER — Other Ambulatory Visit: Payer: Self-pay | Admitting: Internal Medicine

## 2016-06-05 ENCOUNTER — Telehealth: Payer: Self-pay | Admitting: General Practice

## 2016-06-05 ENCOUNTER — Ambulatory Visit (INDEPENDENT_AMBULATORY_CARE_PROVIDER_SITE_OTHER): Payer: Medicare Other | Admitting: *Deleted

## 2016-06-05 DIAGNOSIS — E1149 Type 2 diabetes mellitus with other diabetic neurological complication: Secondary | ICD-10-CM | POA: Diagnosis not present

## 2016-06-05 DIAGNOSIS — R269 Unspecified abnormalities of gait and mobility: Secondary | ICD-10-CM | POA: Diagnosis not present

## 2016-06-05 DIAGNOSIS — I442 Atrioventricular block, complete: Secondary | ICD-10-CM

## 2016-06-05 DIAGNOSIS — I509 Heart failure, unspecified: Secondary | ICD-10-CM | POA: Diagnosis not present

## 2016-06-05 DIAGNOSIS — R262 Difficulty in walking, not elsewhere classified: Secondary | ICD-10-CM | POA: Diagnosis not present

## 2016-06-05 DIAGNOSIS — I482 Chronic atrial fibrillation: Secondary | ICD-10-CM | POA: Diagnosis not present

## 2016-06-05 MED ORDER — WARFARIN SODIUM 2 MG PO TABS: ORAL_TABLET | ORAL | 1 refills | Status: DC

## 2016-06-05 NOTE — Telephone Encounter (Signed)
Spoke with Roe Coombson with [facility] and informed that pt should not be taking ASA EC 81mg . Therapist verbalized understanding.  Medication list was updated in pt's chart.

## 2016-06-05 NOTE — Telephone Encounter (Signed)
Discontinue aspirin (.  Cardiology recommended triple therapy for 1 month)

## 2016-06-05 NOTE — Telephone Encounter (Signed)
Return call to AudubonAngela, patient's grand-daughter.  Marylene Landngela says she does actually have warfarin, 2 mg tablets.  Re-fill called in to  [facility] on [address].

## 2016-06-05 NOTE — Telephone Encounter (Signed)
Granddaughter states pt is not currently taking this medication. Should pt be taking it, it is listed on her medication list.  Please advise.

## 2016-06-05 NOTE — Telephone Encounter (Signed)
Confirmed remote transmission w/ pt granddaughter.   

## 2016-06-05 NOTE — Telephone Encounter (Signed)
° ° ° ° °  Don with [facility] call to ask if pt should be taking the following med   aspirin EC 81 MG EC tablet

## 2016-06-06 DIAGNOSIS — I482 Chronic atrial fibrillation: Secondary | ICD-10-CM | POA: Diagnosis not present

## 2016-06-06 DIAGNOSIS — I509 Heart failure, unspecified: Secondary | ICD-10-CM | POA: Diagnosis not present

## 2016-06-06 DIAGNOSIS — E1149 Type 2 diabetes mellitus with other diabetic neurological complication: Secondary | ICD-10-CM | POA: Diagnosis not present

## 2016-06-06 DIAGNOSIS — R262 Difficulty in walking, not elsewhere classified: Secondary | ICD-10-CM | POA: Diagnosis not present

## 2016-06-06 DIAGNOSIS — R269 Unspecified abnormalities of gait and mobility: Secondary | ICD-10-CM | POA: Diagnosis not present

## 2016-06-07 DIAGNOSIS — R269 Unspecified abnormalities of gait and mobility: Secondary | ICD-10-CM

## 2016-06-07 DIAGNOSIS — E1149 Type 2 diabetes mellitus with other diabetic neurological complication: Secondary | ICD-10-CM | POA: Diagnosis not present

## 2016-06-07 DIAGNOSIS — I10 Essential (primary) hypertension: Secondary | ICD-10-CM

## 2016-06-07 DIAGNOSIS — Z950 Presence of cardiac pacemaker: Secondary | ICD-10-CM

## 2016-06-07 DIAGNOSIS — R262 Difficulty in walking, not elsewhere classified: Secondary | ICD-10-CM | POA: Diagnosis not present

## 2016-06-07 DIAGNOSIS — I482 Chronic atrial fibrillation: Secondary | ICD-10-CM | POA: Diagnosis not present

## 2016-06-07 DIAGNOSIS — E039 Hypothyroidism, unspecified: Secondary | ICD-10-CM

## 2016-06-07 DIAGNOSIS — I509 Heart failure, unspecified: Secondary | ICD-10-CM | POA: Diagnosis not present

## 2016-06-08 DIAGNOSIS — I509 Heart failure, unspecified: Secondary | ICD-10-CM | POA: Diagnosis not present

## 2016-06-08 DIAGNOSIS — R262 Difficulty in walking, not elsewhere classified: Secondary | ICD-10-CM | POA: Diagnosis not present

## 2016-06-08 DIAGNOSIS — R269 Unspecified abnormalities of gait and mobility: Secondary | ICD-10-CM | POA: Diagnosis not present

## 2016-06-08 DIAGNOSIS — E1149 Type 2 diabetes mellitus with other diabetic neurological complication: Secondary | ICD-10-CM | POA: Diagnosis not present

## 2016-06-08 DIAGNOSIS — I482 Chronic atrial fibrillation: Secondary | ICD-10-CM | POA: Diagnosis not present

## 2016-06-09 ENCOUNTER — Ambulatory Visit (INDEPENDENT_AMBULATORY_CARE_PROVIDER_SITE_OTHER): Payer: Medicare Other | Admitting: General Practice

## 2016-06-09 DIAGNOSIS — E1149 Type 2 diabetes mellitus with other diabetic neurological complication: Secondary | ICD-10-CM | POA: Diagnosis not present

## 2016-06-09 DIAGNOSIS — I482 Chronic atrial fibrillation: Secondary | ICD-10-CM | POA: Diagnosis not present

## 2016-06-09 DIAGNOSIS — Z5181 Encounter for therapeutic drug level monitoring: Secondary | ICD-10-CM

## 2016-06-09 DIAGNOSIS — R269 Unspecified abnormalities of gait and mobility: Secondary | ICD-10-CM | POA: Diagnosis not present

## 2016-06-09 DIAGNOSIS — I509 Heart failure, unspecified: Secondary | ICD-10-CM | POA: Diagnosis not present

## 2016-06-09 DIAGNOSIS — Z7901 Long term (current) use of anticoagulants: Secondary | ICD-10-CM

## 2016-06-09 DIAGNOSIS — R262 Difficulty in walking, not elsewhere classified: Secondary | ICD-10-CM | POA: Diagnosis not present

## 2016-06-09 LAB — CUP PACEART REMOTE DEVICE CHECK
Battery Impedance: 1326 Ohm
Battery Remaining Longevity: 44 mo
Battery Voltage: 2.77 V
Brady Statistic RV Percent Paced: 99 %
Implantable Lead Implant Date: 20031208
Implantable Lead Model: 5092
Lead Channel Impedance Value: 581 Ohm
Lead Channel Pacing Threshold Amplitude: 0.75 V
Lead Channel Setting Pacing Pulse Width: 0.4 ms
Lead Channel Setting Sensing Sensitivity: 1.4 mV
MDC IDC LEAD IMPLANT DT: 20031208
MDC IDC LEAD LOCATION: 753859
MDC IDC LEAD LOCATION: 753860
MDC IDC MSMT LEADCHNL RA IMPEDANCE VALUE: 67 Ohm
MDC IDC MSMT LEADCHNL RV PACING THRESHOLD PULSEWIDTH: 0.4 ms
MDC IDC PG IMPLANT DT: 20120104
MDC IDC SESS DTM: 20180116234840
MDC IDC SET LEADCHNL RV PACING AMPLITUDE: 2.5 V

## 2016-06-09 LAB — POCT INR: INR: 2.3

## 2016-06-09 NOTE — Progress Notes (Signed)
I have reviewed and agree with the plan. 

## 2016-06-09 NOTE — Patient Instructions (Signed)
Pre visit review using our clinic review tool, if applicable. No additional management support is needed unless otherwise documented below in the visit note. 

## 2016-06-09 NOTE — Progress Notes (Signed)
Remote pacemaker transmission.   

## 2016-06-10 ENCOUNTER — Other Ambulatory Visit: Payer: Self-pay | Admitting: Internal Medicine

## 2016-06-11 ENCOUNTER — Encounter (HOSPITAL_COMMUNITY): Payer: Self-pay | Admitting: Emergency Medicine

## 2016-06-11 ENCOUNTER — Emergency Department (HOSPITAL_COMMUNITY)
Admission: EM | Admit: 2016-06-11 | Discharge: 2016-06-11 | Disposition: A | Discharge status: Home / Self Care | Payer: Medicare Other | Attending: Emergency Medicine | Admitting: Emergency Medicine

## 2016-06-11 ENCOUNTER — Emergency Department (HOSPITAL_COMMUNITY): Payer: Medicare Other

## 2016-06-11 DIAGNOSIS — Y999 Unspecified external cause status: Secondary | ICD-10-CM | POA: Insufficient documentation

## 2016-06-11 DIAGNOSIS — Z7984 Long term (current) use of oral hypoglycemic drugs: Secondary | ICD-10-CM | POA: Insufficient documentation

## 2016-06-11 DIAGNOSIS — S60221A Contusion of right hand, initial encounter: Secondary | ICD-10-CM

## 2016-06-11 DIAGNOSIS — M25521 Pain in right elbow: Secondary | ICD-10-CM | POA: Diagnosis not present

## 2016-06-11 DIAGNOSIS — Z950 Presence of cardiac pacemaker: Secondary | ICD-10-CM | POA: Insufficient documentation

## 2016-06-11 DIAGNOSIS — Y92009 Unspecified place in unspecified non-institutional (private) residence as the place of occurrence of the external cause: Secondary | ICD-10-CM | POA: Diagnosis not present

## 2016-06-11 DIAGNOSIS — M79641 Pain in right hand: Secondary | ICD-10-CM | POA: Diagnosis not present

## 2016-06-11 DIAGNOSIS — S20211A Contusion of right front wall of thorax, initial encounter: Secondary | ICD-10-CM | POA: Diagnosis not present

## 2016-06-11 DIAGNOSIS — S59901A Unspecified injury of right elbow, initial encounter: Secondary | ICD-10-CM | POA: Diagnosis not present

## 2016-06-11 DIAGNOSIS — E039 Hypothyroidism, unspecified: Secondary | ICD-10-CM | POA: Diagnosis not present

## 2016-06-11 DIAGNOSIS — W1830XA Fall on same level, unspecified, initial encounter: Secondary | ICD-10-CM | POA: Insufficient documentation

## 2016-06-11 DIAGNOSIS — I110 Hypertensive heart disease with heart failure: Secondary | ICD-10-CM | POA: Diagnosis not present

## 2016-06-11 DIAGNOSIS — Y939 Activity, unspecified: Secondary | ICD-10-CM | POA: Diagnosis not present

## 2016-06-11 DIAGNOSIS — W19XXXA Unspecified fall, initial encounter: Secondary | ICD-10-CM

## 2016-06-11 DIAGNOSIS — S5001XA Contusion of right elbow, initial encounter: Secondary | ICD-10-CM | POA: Insufficient documentation

## 2016-06-11 DIAGNOSIS — Z7901 Long term (current) use of anticoagulants: Secondary | ICD-10-CM | POA: Insufficient documentation

## 2016-06-11 DIAGNOSIS — E119 Type 2 diabetes mellitus without complications: Secondary | ICD-10-CM | POA: Insufficient documentation

## 2016-06-11 DIAGNOSIS — I509 Heart failure, unspecified: Secondary | ICD-10-CM | POA: Diagnosis not present

## 2016-06-11 DIAGNOSIS — R079 Chest pain, unspecified: Secondary | ICD-10-CM | POA: Diagnosis not present

## 2016-06-11 DIAGNOSIS — Z79899 Other long term (current) drug therapy: Secondary | ICD-10-CM | POA: Diagnosis not present

## 2016-06-11 DIAGNOSIS — S6991XA Unspecified injury of right wrist, hand and finger(s), initial encounter: Secondary | ICD-10-CM | POA: Diagnosis present

## 2016-06-11 LAB — URINALYSIS, ROUTINE W REFLEX MICROSCOPIC
BILIRUBIN URINE: NEGATIVE
Glucose, UA: NEGATIVE mg/dL
Hgb urine dipstick: NEGATIVE
Ketones, ur: NEGATIVE mg/dL
Leukocytes, UA: NEGATIVE
NITRITE: NEGATIVE
PH: 7 (ref 5.0–8.0)
Protein, ur: NEGATIVE mg/dL
SPECIFIC GRAVITY, URINE: 1.009 (ref 1.005–1.030)

## 2016-06-11 LAB — PROTIME-INR
INR: 3.85
Prothrombin Time: 38.8 seconds — ABNORMAL HIGH (ref 11.4–15.2)

## 2016-06-11 MED ORDER — ACETAMINOPHEN 500 MG PO TABS
1000.0000 mg | ORAL_TABLET | Freq: Once | ORAL | Status: AC
  Administered 2016-06-11: 1000 mg via ORAL
  Filled 2016-06-11: qty 2

## 2016-06-11 NOTE — ED Triage Notes (Addendum)
Pt fell at home today landing on on right side.  Right elbow and hand with large hematoma noted. Pain to right chest "I think I hurt my boob".  Reports pain with palpation to chest area.  Able to move all with difficulty.  Denies hitting head.  Last INR was 3 days ago with a report from dr of "perfect".

## 2016-06-11 NOTE — ED Provider Notes (Signed)
[facility] Provider Note   CSN: [id number] Arrival date & time: 06/11/16  1451     History   Chief Complaint Chief Complaint  Patient presents with  . Fall    HPI [name] is a 83 y.o. female.  Patient presents after mechanical fall she tripped on her cane on the carpet causing her to hit her right flank and arm. Patient is on Coumadin and is followed regularly for. Patient denies any head injury, patient recalls details of the fall. Family confirms her mental status at bedside. Pain with range of motion. Ecchymosis right arm.      Past Medical History:  Diagnosis Date  . Anxiety   . Atrial fibrillation (HCC)   . Atrial fibrillation (HCC)   . CHF (congestive heart failure) (HCC)   . Constipation   . Diverticulosis of colon   . DM (diabetes mellitus) (HCC)   . Hyperlipidemia   . Hypertension   . Hypothyroidism   . Osteoporosis   . Pain in left ear   . Thyroid disease   . Tremor, essential   . Type II or unspecified type diabetes mellitus with neurological manifestations, not stated as uncontrolled(250.60)     Patient Active Problem List   Diagnosis Date Noted  . CHB (complete heart block) (HCC) post AVN ablation 04/22/2016  . PAH (pulmonary artery hypertension) 04/22/2016  . NSTEMI (non-ST elevated myocardial infarction) (HCC) 04/20/2016  . Abdominal pain   . Encounter for therapeutic drug monitoring 09/04/2013  . Long term current use of anticoagulant therapy 01/06/2013  . Renal insufficiency 09/12/2010  . Dvt femoral (deep venous thrombosis) (HCC) 09/08/2010  . Hearing loss 11/08/2009  . BACK PAIN 09/06/2009  . PACEMAKER, PERMANENT 10/13/2008  . OSTEOPOROSIS 07/05/2007  . Dyslipidemia 05/03/2007  . TREMOR, ESSENTIAL 02/26/2007  . Congestive heart failure (HCC) 01/26/2007  . Hypothyroidism 01/02/2007  . Diabetes mellitus with neuropathy (HCC) 01/02/2007  . Anxiety state 01/02/2007  . Essential hypertension 01/02/2007  . ATRIAL  FIBRILLATION 01/02/2007  . DIVERTICULOSIS, COLON 01/02/2007    Past Surgical History:  Procedure Laterality Date  . CARDIAC CATHETERIZATION N/A 04/20/2016   Procedure: Left Heart Cath and Coronary Angiography;  Surgeon: Yvonne Kendall, MD;  [facility] INVASIVE CV LAB;  Service: Cardiovascular;  Laterality: N/A;  . CARDIAC CATHETERIZATION N/A 04/24/2016   Procedure: Coronary Stent Intervention;  Surgeon: Lennette Bihari, MD;  Location: [facility] INVASIVE CV LAB;  Service: Cardiovascular;  Laterality: N/A;  . CARDIAC CATHETERIZATION N/A 04/24/2016   Procedure: Coronary Balloon Angioplasty;  Surgeon: Lennette Bihari, MD;  Location: [facility] INVASIVE CV LAB;  Service: Cardiovascular;  Laterality: N/A;  . CARDIAC CATHETERIZATION N/A 04/26/2016   Procedure: Coronary Stent Intervention;  Surgeon: Iran Ouch, MD;  Location: [facility] INVASIVE CV LAB;  Service: Cardiovascular;  Laterality: N/A;  . CATARACT EXTRACTION  2002  . L3 compression fraction    . PACEMAKER PLACEMENT    . TRANSTHORACIC ECHOCARDIOGRAM  2011, 2012    OB History    No data available       Home Medications    Prior to Admission medications   Medication Sig Start Date End Date Taking? Authorizing Provider  atorvastatin (LIPITOR) 40 MG tablet Take 1 tablet (40 mg total) by mouth daily at 6 PM. Patient taking differently: Take 40 mg by mouth daily.  05/01/16  Yes Arty Baumgartner, NP  CARTIA XT 300 MG 24 hr capsule take 1 capsule by mouth once daily 03/09/16  Yes Gordy Savers,  MD  clopidogrel (PLAVIX) 75 MG tablet Take 1 tablet (75 mg total) by mouth daily. 05/02/16  Yes Arty Baumgartner, NP  diazepam (VALIUM) 5 MG tablet take 1 tablet by mouth twice a day if needed Patient taking differently: take 1 tablet by mouth twice a day 03/27/16  Yes Gordy Savers, MD  ferrous sulfate 325 (65 FE) MG tablet take 1 tablet by mouth once daily 03/31/16  Yes Gordy Savers, MD  furosemide (LASIX) 40 MG tablet Take 40 mg by mouth  daily.   Yes Historical Provider, MD  hyoscyamine (LEVBID) 0.375 MG 12 hr tablet take 1 tablet by mouth twice a day ONLY IF NEEDED  **DO NOT TAKE EVERYDAY MUST LAST 30 DAYS** Patient taking differently: TAKE 1 TABLET BY MOUTH EVERY OTHER NIGHT - MUST LAST 30 DAYS 12/20/15  Yes Gordy Savers, MD  levalbuterol Pauline Aus) 0.63 MG/3ML nebulizer solution Take 0.63 mg by nebulization every 6 (six) hours as needed for wheezing or shortness of breath.   Yes Historical Provider, MD  levothyroxine (SYNTHROID, LEVOTHROID) 75 MCG tablet take 1 tablet by mouth once daily 03/31/16  Yes Gordy Savers, MD  metFORMIN (GLUCOPHAGE-XR) 500 MG 24 hr tablet Take 1 tablet (500 mg total) by mouth daily with breakfast. 11/26/15  Yes Gordy Savers, MD  polyethylene glycol powder (GLYCOLAX/MIRALAX) powder Take 17 g by mouth daily as needed (constipation). Mix in 8 oz liquid and drink   Yes Historical Provider, MD  potassium chloride (K-DUR,KLOR-CON) 10 MEQ tablet take 1 tablet by mouth once daily 05/30/16  Yes Gordy Savers, MD  traMADol (ULTRAM) 50 MG tablet Take 1 tablet (50 mg total) by mouth every 6 (six) hours as needed for moderate pain. Patient taking differently: Take 50 mg by mouth daily as needed for moderate pain.  10/26/15  Yes Nelwyn Salisbury, MD  warfarin (COUMADIN) 2 MG tablet Take as directed by anticoagulation clinic Patient taking differently: Take 2-4 mg by mouth daily. Take 2 tablets (4 mg) by mouth on Wednesday mornings, take 1 tablet (2 mg) on all other days of the week - or as directed by anticoagulation clinic 06/05/16  Yes Gordy Savers, MD  furosemide (LASIX) 20 MG tablet take 1 tablet by mouth once daily Patient not taking: Reported on 06/11/2016 01/04/16   Gordy Savers, MD    Family History Family History  Problem Relation Age of Onset  . Heart attack Father   . Colon cancer Neg Hx     Social History Social History  Substance Use Topics  . Smoking status: Never  Smoker  . Smokeless tobacco: Never Used  . Alcohol use No     Allergies   Codeine sulfate and Simvastatin   Review of Systems Review of Systems  Constitutional: Negative for chills and fever.  HENT: Negative for congestion.   Eyes: Negative for visual disturbance.  Respiratory: Negative for shortness of breath.   Cardiovascular: Positive for chest pain (right lower rib pain).  Gastrointestinal: Negative for abdominal pain and vomiting.  Genitourinary: Negative for dysuria and flank pain.  Musculoskeletal: Positive for arthralgias. Negative for back pain, neck pain and neck stiffness.  Skin: Negative for rash.  Neurological: Negative for light-headedness and headaches.     Physical Exam Updated Vital Signs BP 128/74 (BP Location: Right Arm)   Pulse 76   Temp 98.3 F (36.8 C) (Oral)   Resp 16   Wt 145 lb (65.8 kg)   SpO2 99%  BMI 24.89 kg/m   Physical Exam  Constitutional: She appears well-developed and well-nourished. No distress.  HENT:  Head: Normocephalic and atraumatic.  Eyes: Right eye exhibits no discharge. Left eye exhibits no discharge.  Neck: Neck supple.  Cardiovascular: Normal rate.   Pulmonary/Chest: Effort normal and breath sounds normal. No respiratory distress.  Abdominal: Soft. There is no tenderness.  Musculoskeletal: She exhibits tenderness. She exhibits no edema.  Patient has mild tenderness with hematoma and ecchymosis dorsal aspect of lateral right hand, no deformity. Patient has full range of motion of right wrist and elbow without significant discomfort. Patient is ecchymosis lateral right elbow without significant pain. Hematoma present. Patient has no midline cervical thoracic or lumbar tenderness. Patient has no focal knee or hip tenderness with range of motion bilateral neck is supple  Neurological: She is alert. No cranial nerve deficit.  Skin: Skin is warm and dry.  Psychiatric: She has a normal mood and affect.  Nursing note and vitals  reviewed.    ED Treatments / Results  Labs (all labs ordered are listed, but only abnormal results are displayed) Labs Reviewed  PROTIME-INR - Abnormal; Notable for the following:       Result Value   Prothrombin Time 38.8 (*)    All other components within normal limits  URINALYSIS, ROUTINE W REFLEX MICROSCOPIC    EKG  EKG Interpretation None       Radiology Dg Chest 2 View  Result Date: 06/11/2016 CLINICAL DATA:  Fall, right arm and right chest pain, right rib pain EXAM: CHEST  2 VIEW COMPARISON:  05/14/2016 and 04/21/2016 FINDINGS: Cardiomegaly again noted. Two lead cardiac pacemaker is unchanged in position. No pulmonary edema. No segmental infiltrate. There is small bilateral pleural effusion with bilateral basilar posterior atelectasis. Osteopenia and degenerative changes are noted thoracic spine. Stable mild compression deformity lower thoracic spine. Stable moderate compression deformity lower thoracic/upper lumbar spine. Stable old fracture deformity right tenth rib. IMPRESSION: No pulmonary edema. No segmental infiltrate. There is small bilateral pleural effusion with bilateral basilar posterior atelectasis. Osteopenia and degenerative changes are noted thoracic spine. Stable mild compression deformity lower thoracic spine. Stable moderate compression deformity lower thoracic/upper lumbar spine. Stable old fracture deformity right tenth rib. Electronically Signed   By: Natasha Mead M.D.   On: 06/11/2016 16:42   Dg Elbow Complete Right  Result Date: 06/11/2016 CLINICAL DATA:  Initial evaluation for acute trauma, fall, pain. EXAM: RIGHT ELBOW - COMPLETE 3+ VIEW COMPARISON:  None. FINDINGS: There is no evidence of fracture, dislocation, or joint effusion. There is no evidence of arthropathy or other focal bone abnormality. Soft tissue swelling/ hematoma at the lateral aspect of the elbow/proximal forearm. IMPRESSION: 1. No acute osseous abnormality about the elbow. 2. Prominent soft  tissue swelling/contusion adjacent to the elbow. Electronically Signed   By: Rise Mu M.D.   On: 06/11/2016 20:11   Dg Hand Complete Right  Result Date: 06/11/2016 CLINICAL DATA:  Initial evaluation for acute trauma, fall, pain. EXAM: RIGHT HAND - COMPLETE 3+ VIEW COMPARISON:  Prior radiograph from 05/15/2015. FINDINGS: No acute fracture or dislocation. Scattered degenerative osteoarthritic changes seen throughout the hand, most notable at the first [facility] joint. No acute soft tissue abnormality. Osteopenia noted. IMPRESSION: 1. No acute fracture or dislocation. 2. Degenerative osteoarthrosis, most notable at the first [facility] joint. Electronically Signed   By: Rise Mu M.D.   On: 06/11/2016 20:17    Procedures Procedures (including critical care time)  Medications Ordered in ED Medications  acetaminophen (TYLENOL) tablet 1,000 mg (1,000 mg Oral Given 06/11/16 1934)     Initial Impression / Assessment and Plan / ED Course  I have reviewed the triage vital signs and the nursing notes.  Pertinent labs & imaging results that were available during my care of the patient were reviewed by me and considered in my medical decision making (see chart for details).     Final Clinical Impressions(s) / ED Diagnoses   Final diagnoses:  Fall, initial encounter  Contusion of right hand, initial encounter  Rib contusion right  Patient presented after mechanical fall. X-rays no fracture. Patient has contusions and hematoma secondary to blood thinners. Patient stable for outpatient follow-up. Discussed likely rib contusion/occult rib fracture. Discussed spirometer and supportive care.  Results and differential diagnosis were discussed with the patient/parent/guardian. Xrays were independently reviewed by myself.  Close follow up outpatient was discussed, comfortable with the plan.   Medications  acetaminophen (TYLENOL) tablet 1,000 mg (1,000 mg Oral Given 06/11/16 1934)    Vitals:    06/11/16 1504 06/11/16 1506 06/11/16 1937 06/11/16 2042  BP: 94/65  126/69 128/74  Pulse: 70  70 76  Resp: 20  16 16   Temp: 98.3 F (36.8 C)     TempSrc: Oral     SpO2: 98%  98% 99%  Weight:  145 lb (65.8 kg)      Final diagnoses:  Fall, initial encounter  Contusion of right hand, initial encounter    New Prescriptions New Prescriptions   No medications on file     Blane OharaJoshua Mozell Hardacre, MD 06/11/16 2045

## 2016-06-11 NOTE — ED Triage Notes (Signed)
Onset today using her cane tripped on carpet fell onto right arm and right chest.  Patient taking coumadin 2mg  a day and Wednesday 4mg . Per Daughter at bedside said coumadin level was normal 2 days ago. Denies hitting head or LOC. Ecchymosis right elbow and right hand with redness under right breast. Alert answering and following commands appropriate.

## 2016-06-11 NOTE — ED Triage Notes (Signed)
Denies hitting head.

## 2016-06-11 NOTE — Discharge Instructions (Signed)
If you were given medicines take as directed.  If you are on coumadin or contraceptives realize their levels and effectiveness is altered by many different medicines.  If you have any reaction (rash, tongues swelling, other) to the medicines stop taking and see a physician.    If your blood pressure was elevated in the ER make sure you follow up for management with a primary doctor or return for chest pain, shortness of breath or stroke symptoms.  Please follow up as directed and return to the ER or see a physician for new or worsening symptoms.  Thank you. Vitals:   06/11/16 1504 06/11/16 1506 06/11/16 1937  BP: 94/65  126/69  Pulse: 70  70  Resp: 20  16  Temp: 98.3 F (36.8 C)    TempSrc: Oral    SpO2: 98%  98%  Weight:  145 lb (65.8 kg)

## 2016-06-11 NOTE — ED Notes (Signed)
Ice pack given to patient.

## 2016-06-12 ENCOUNTER — Other Ambulatory Visit: Payer: Self-pay | Admitting: Internal Medicine

## 2016-06-12 ENCOUNTER — Telehealth: Payer: Self-pay | Admitting: Internal Medicine

## 2016-06-12 ENCOUNTER — Ambulatory Visit (INDEPENDENT_AMBULATORY_CARE_PROVIDER_SITE_OTHER): Payer: Medicare Other | Admitting: General Practice

## 2016-06-12 DIAGNOSIS — R269 Unspecified abnormalities of gait and mobility: Secondary | ICD-10-CM | POA: Diagnosis not present

## 2016-06-12 DIAGNOSIS — I509 Heart failure, unspecified: Secondary | ICD-10-CM | POA: Diagnosis not present

## 2016-06-12 DIAGNOSIS — Z5181 Encounter for therapeutic drug level monitoring: Secondary | ICD-10-CM

## 2016-06-12 DIAGNOSIS — E1149 Type 2 diabetes mellitus with other diabetic neurological complication: Secondary | ICD-10-CM | POA: Diagnosis not present

## 2016-06-12 DIAGNOSIS — I482 Chronic atrial fibrillation: Secondary | ICD-10-CM | POA: Diagnosis not present

## 2016-06-12 DIAGNOSIS — R262 Difficulty in walking, not elsewhere classified: Secondary | ICD-10-CM | POA: Diagnosis not present

## 2016-06-12 DIAGNOSIS — Z7901 Long term (current) use of anticoagulants: Secondary | ICD-10-CM

## 2016-06-12 LAB — POCT INR: INR: 5.6

## 2016-06-12 NOTE — Telephone Encounter (Signed)
Just a FYI, see message below.

## 2016-06-12 NOTE — Patient Instructions (Signed)
Pre visit review using our clinic review tool, if applicable. No additional management support is needed unless otherwise documented below in the visit note. 

## 2016-06-12 NOTE — Telephone Encounter (Signed)
[name] [name] is calling to report pt have a fall on sat went to er. Pt just has bruising on right side.

## 2016-06-13 ENCOUNTER — Telehealth: Payer: Self-pay | Admitting: Internal Medicine

## 2016-06-13 ENCOUNTER — Telehealth: Payer: Self-pay | Admitting: General Practice

## 2016-06-13 DIAGNOSIS — E1149 Type 2 diabetes mellitus with other diabetic neurological complication: Secondary | ICD-10-CM | POA: Diagnosis not present

## 2016-06-13 DIAGNOSIS — I482 Chronic atrial fibrillation: Secondary | ICD-10-CM | POA: Diagnosis not present

## 2016-06-13 DIAGNOSIS — R269 Unspecified abnormalities of gait and mobility: Secondary | ICD-10-CM | POA: Diagnosis not present

## 2016-06-13 DIAGNOSIS — R262 Difficulty in walking, not elsewhere classified: Secondary | ICD-10-CM | POA: Diagnosis not present

## 2016-06-13 DIAGNOSIS — I509 Heart failure, unspecified: Secondary | ICD-10-CM | POA: Diagnosis not present

## 2016-06-13 NOTE — Telephone Encounter (Signed)
VO to Theodis BlazeBuzz Richardson, RN to draw a CMET on  Friday, 1/26.  RN verbalized understanding.

## 2016-06-13 NOTE — Telephone Encounter (Signed)
Please see message below

## 2016-06-13 NOTE — Telephone Encounter (Signed)
-----   Message from Gordy SaversPeter F Kwiatkowski, MD sent at 06/13/2016  8:00 AM EST ----- Regarding: RE: CMET Yes, thank you. PK ----- Message ----- From: Garrison Columbusynthia D Payden Bonus, RN Sent: 06/12/2016   4:57 PM To: Gordy SaversPeter F Kwiatkowski, MD Subject: RE: CMET                                       The home health RN will be checking her INR on Friday.  Could I give the RN a verbal order to draw a CMET? ----- Message ----- From: Gordy SaversPeter F Kwiatkowski, MD Sent: 06/12/2016   4:40 PM To: Garrison Columbusynthia D Imad Shostak, RN Subject: RE: CMET                                       Yes, let's due a CMET when she is back for f/u INR. PK ----- Message ----- From: Garrison Columbusynthia D Ardel Jagger, RN Sent: 06/12/2016   3:31 PM To: Gordy SaversPeter F Kwiatkowski, MD Subject: CMET                                           Hi Dr. Kirtland BouchardK.  Patient had a fall over the weekend.  No broken bones however,  Her INR was 5.6 yesterday, 1/21.  What concerns me is that it was 2.3 on Friday the 19th.  Would it be beneficial to order a CMET to look at her liver function?  Angie, her granddaughter has been filling her pill box and patient doesn't have access to the warfarin bottle.

## 2016-06-13 NOTE — Telephone Encounter (Signed)
[name] is calling to report pt had a fall last Saturday and pt went to er. Pt has bruising on right elbow to her fingers and right iliac also right knee. Pt has pain over anterior ribs 7,8 and 9 th. [name] would like to extend her PT orders for twice a wk for 2 wk's and then once a wk for 3 wk's

## 2016-06-13 NOTE — Telephone Encounter (Signed)
Okay to extend PT 

## 2016-06-14 ENCOUNTER — Encounter: Payer: Self-pay | Admitting: Cardiology

## 2016-06-14 NOTE — Telephone Encounter (Signed)
Spoke to Dana CorporationShree,PT verbal orders were given per Dr.K to extend her PT orders for twice a wk for 2 wk's and then once a wk for 3 wk's.

## 2016-06-15 ENCOUNTER — Encounter: Payer: Self-pay | Admitting: Internal Medicine

## 2016-06-15 ENCOUNTER — Ambulatory Visit (INDEPENDENT_AMBULATORY_CARE_PROVIDER_SITE_OTHER): Payer: Medicare Other | Admitting: General Practice

## 2016-06-15 DIAGNOSIS — I509 Heart failure, unspecified: Secondary | ICD-10-CM | POA: Diagnosis not present

## 2016-06-15 DIAGNOSIS — Z5181 Encounter for therapeutic drug level monitoring: Secondary | ICD-10-CM

## 2016-06-15 DIAGNOSIS — R262 Difficulty in walking, not elsewhere classified: Secondary | ICD-10-CM | POA: Diagnosis not present

## 2016-06-15 DIAGNOSIS — R269 Unspecified abnormalities of gait and mobility: Secondary | ICD-10-CM | POA: Diagnosis not present

## 2016-06-15 DIAGNOSIS — I482 Chronic atrial fibrillation: Secondary | ICD-10-CM | POA: Diagnosis not present

## 2016-06-15 DIAGNOSIS — Z7901 Long term (current) use of anticoagulants: Secondary | ICD-10-CM

## 2016-06-15 DIAGNOSIS — E1149 Type 2 diabetes mellitus with other diabetic neurological complication: Secondary | ICD-10-CM | POA: Diagnosis not present

## 2016-06-15 LAB — POCT INR: INR: 3.3

## 2016-06-15 NOTE — Patient Instructions (Signed)
Pre visit review using our clinic review tool, if applicable. No additional management support is needed unless otherwise documented below in the visit note. 

## 2016-06-16 ENCOUNTER — Telehealth: Payer: Self-pay | Admitting: Internal Medicine

## 2016-06-16 DIAGNOSIS — R269 Unspecified abnormalities of gait and mobility: Secondary | ICD-10-CM | POA: Diagnosis not present

## 2016-06-16 DIAGNOSIS — R262 Difficulty in walking, not elsewhere classified: Secondary | ICD-10-CM | POA: Diagnosis not present

## 2016-06-16 DIAGNOSIS — I509 Heart failure, unspecified: Secondary | ICD-10-CM | POA: Diagnosis not present

## 2016-06-16 DIAGNOSIS — I482 Chronic atrial fibrillation: Secondary | ICD-10-CM | POA: Diagnosis not present

## 2016-06-16 DIAGNOSIS — E1149 Type 2 diabetes mellitus with other diabetic neurological complication: Secondary | ICD-10-CM | POA: Diagnosis not present

## 2016-06-16 NOTE — Telephone Encounter (Signed)
See message below, please advise.

## 2016-06-16 NOTE — Telephone Encounter (Signed)
Don OT would like to extended patient for  OT for zero visit next wk and then once a wk for 1 wk and following week  zero visit and finally one visit for 1  Wk for ADLS,transfers.verbal is OK

## 2016-06-16 NOTE — Telephone Encounter (Signed)
OK to extend OT

## 2016-06-19 DIAGNOSIS — R262 Difficulty in walking, not elsewhere classified: Secondary | ICD-10-CM | POA: Diagnosis not present

## 2016-06-19 DIAGNOSIS — I482 Chronic atrial fibrillation: Secondary | ICD-10-CM | POA: Diagnosis not present

## 2016-06-19 DIAGNOSIS — R269 Unspecified abnormalities of gait and mobility: Secondary | ICD-10-CM | POA: Diagnosis not present

## 2016-06-19 DIAGNOSIS — I509 Heart failure, unspecified: Secondary | ICD-10-CM | POA: Diagnosis not present

## 2016-06-19 DIAGNOSIS — E1149 Type 2 diabetes mellitus with other diabetic neurological complication: Secondary | ICD-10-CM | POA: Diagnosis not present

## 2016-06-19 NOTE — Telephone Encounter (Signed)
Called Don,OT woth verbal orders to extended patient for  OT for zero visit next wk and then once a wk for 1 wk and following week  zero visit and finally one visit for 1  Wk for ADLS,transfers.verbal is OK

## 2016-06-19 NOTE — Telephone Encounter (Signed)
Left message on voicemail to call office.  

## 2016-06-20 DIAGNOSIS — I482 Chronic atrial fibrillation: Secondary | ICD-10-CM | POA: Diagnosis not present

## 2016-06-20 DIAGNOSIS — R262 Difficulty in walking, not elsewhere classified: Secondary | ICD-10-CM | POA: Diagnosis not present

## 2016-06-20 DIAGNOSIS — A419 Sepsis, unspecified organism: Secondary | ICD-10-CM | POA: Diagnosis not present

## 2016-06-20 DIAGNOSIS — I509 Heart failure, unspecified: Secondary | ICD-10-CM | POA: Diagnosis not present

## 2016-06-20 DIAGNOSIS — E1149 Type 2 diabetes mellitus with other diabetic neurological complication: Secondary | ICD-10-CM | POA: Diagnosis not present

## 2016-06-20 DIAGNOSIS — R269 Unspecified abnormalities of gait and mobility: Secondary | ICD-10-CM | POA: Diagnosis not present

## 2016-06-20 NOTE — Telephone Encounter (Signed)
Don,OT made aware per Dr Kirtland BouchardK it is ok to extend OT services.

## 2016-06-21 DIAGNOSIS — R262 Difficulty in walking, not elsewhere classified: Secondary | ICD-10-CM | POA: Diagnosis not present

## 2016-06-21 DIAGNOSIS — E1149 Type 2 diabetes mellitus with other diabetic neurological complication: Secondary | ICD-10-CM | POA: Diagnosis not present

## 2016-06-21 DIAGNOSIS — I482 Chronic atrial fibrillation: Secondary | ICD-10-CM | POA: Diagnosis not present

## 2016-06-21 DIAGNOSIS — I509 Heart failure, unspecified: Secondary | ICD-10-CM | POA: Diagnosis not present

## 2016-06-21 DIAGNOSIS — R269 Unspecified abnormalities of gait and mobility: Secondary | ICD-10-CM | POA: Diagnosis not present

## 2016-06-21 LAB — POCT INR: INR: 1.9

## 2016-06-23 ENCOUNTER — Ambulatory Visit (INDEPENDENT_AMBULATORY_CARE_PROVIDER_SITE_OTHER): Payer: Medicare Other | Admitting: General Practice

## 2016-06-23 DIAGNOSIS — E1149 Type 2 diabetes mellitus with other diabetic neurological complication: Secondary | ICD-10-CM | POA: Diagnosis not present

## 2016-06-23 DIAGNOSIS — R269 Unspecified abnormalities of gait and mobility: Secondary | ICD-10-CM | POA: Diagnosis not present

## 2016-06-23 DIAGNOSIS — Z5181 Encounter for therapeutic drug level monitoring: Secondary | ICD-10-CM

## 2016-06-23 DIAGNOSIS — Z7901 Long term (current) use of anticoagulants: Secondary | ICD-10-CM

## 2016-06-23 DIAGNOSIS — R262 Difficulty in walking, not elsewhere classified: Secondary | ICD-10-CM | POA: Diagnosis not present

## 2016-06-23 DIAGNOSIS — I509 Heart failure, unspecified: Secondary | ICD-10-CM | POA: Diagnosis not present

## 2016-06-23 DIAGNOSIS — I482 Chronic atrial fibrillation: Secondary | ICD-10-CM | POA: Diagnosis not present

## 2016-06-23 LAB — POCT INR: INR: 2.9

## 2016-06-23 NOTE — Progress Notes (Signed)
I have reviewed and agree with the plan. 

## 2016-06-23 NOTE — Patient Instructions (Signed)
Pre visit review using our clinic review tool, if applicable. No additional management support is needed unless otherwise documented below in the visit note. 

## 2016-06-26 DIAGNOSIS — I509 Heart failure, unspecified: Secondary | ICD-10-CM | POA: Diagnosis not present

## 2016-06-26 DIAGNOSIS — R262 Difficulty in walking, not elsewhere classified: Secondary | ICD-10-CM | POA: Diagnosis not present

## 2016-06-26 DIAGNOSIS — E1149 Type 2 diabetes mellitus with other diabetic neurological complication: Secondary | ICD-10-CM | POA: Diagnosis not present

## 2016-06-26 DIAGNOSIS — I482 Chronic atrial fibrillation: Secondary | ICD-10-CM | POA: Diagnosis not present

## 2016-06-26 DIAGNOSIS — R269 Unspecified abnormalities of gait and mobility: Secondary | ICD-10-CM | POA: Diagnosis not present

## 2016-06-28 ENCOUNTER — Ambulatory Visit (INDEPENDENT_AMBULATORY_CARE_PROVIDER_SITE_OTHER): Payer: Medicare Other | Admitting: General Practice

## 2016-06-28 DIAGNOSIS — E1149 Type 2 diabetes mellitus with other diabetic neurological complication: Secondary | ICD-10-CM | POA: Diagnosis not present

## 2016-06-28 DIAGNOSIS — R262 Difficulty in walking, not elsewhere classified: Secondary | ICD-10-CM | POA: Diagnosis not present

## 2016-06-28 DIAGNOSIS — R269 Unspecified abnormalities of gait and mobility: Secondary | ICD-10-CM | POA: Diagnosis not present

## 2016-06-28 DIAGNOSIS — I482 Chronic atrial fibrillation: Secondary | ICD-10-CM | POA: Diagnosis not present

## 2016-06-28 DIAGNOSIS — Z5181 Encounter for therapeutic drug level monitoring: Secondary | ICD-10-CM

## 2016-06-28 DIAGNOSIS — I509 Heart failure, unspecified: Secondary | ICD-10-CM | POA: Diagnosis not present

## 2016-06-28 DIAGNOSIS — Z7901 Long term (current) use of anticoagulants: Secondary | ICD-10-CM

## 2016-06-28 LAB — POCT INR: INR: 1.9

## 2016-06-28 NOTE — Patient Instructions (Signed)
Pre visit review using our clinic review tool, if applicable. No additional management support is needed unless otherwise documented below in the visit note. 

## 2016-06-29 ENCOUNTER — Telehealth: Payer: Self-pay | Admitting: Internal Medicine

## 2016-06-29 DIAGNOSIS — I509 Heart failure, unspecified: Secondary | ICD-10-CM | POA: Diagnosis not present

## 2016-06-29 DIAGNOSIS — R262 Difficulty in walking, not elsewhere classified: Secondary | ICD-10-CM | POA: Diagnosis not present

## 2016-06-29 DIAGNOSIS — I482 Chronic atrial fibrillation: Secondary | ICD-10-CM | POA: Diagnosis not present

## 2016-06-29 DIAGNOSIS — R269 Unspecified abnormalities of gait and mobility: Secondary | ICD-10-CM | POA: Diagnosis not present

## 2016-06-29 DIAGNOSIS — E1149 Type 2 diabetes mellitus with other diabetic neurological complication: Secondary | ICD-10-CM | POA: Diagnosis not present

## 2016-06-29 NOTE — Telephone Encounter (Signed)
Patient Name: [name] DOB: Dec 30, 1926 Initial Comment Caller states grandmother, she is having back pain, tramadol at 9am, home health nurse there now, and could she take an extra strength tylenol now. Next tramadol time is 3pm. Nurse Assessment Nurse: Reed Pandyamsey, RN, Amy Date/Time Lamount Cohen(Eastern Time): 06/29/2016 2:07:22 PM Confirm and document reason for call. If symptomatic, describe symptoms. ---Caller states that her grandmother takes Tramadol for back pain and had a dose at 9am and is not due for another until 3pm and is wondering if she may take extra strength Tylenol. Per [facility] there are no recorded interactions and it should be fine but told her to only take 1 extra strength Tylenol and that if the Tramadol does not seem to be taking her pain down to a tolerable level in the future to please call back. She had therapy today and said she is just having an extra bad day with her chronic back pain. She has no other symptoms and granddaughter declined triage. No further questions and she verbalized understanding of all things discussed. Does the patient have any new or worsening symptoms? ---No

## 2016-06-29 NOTE — Telephone Encounter (Signed)
Noted  

## 2016-06-30 DIAGNOSIS — E1149 Type 2 diabetes mellitus with other diabetic neurological complication: Secondary | ICD-10-CM | POA: Diagnosis not present

## 2016-06-30 DIAGNOSIS — I482 Chronic atrial fibrillation: Secondary | ICD-10-CM | POA: Diagnosis not present

## 2016-06-30 DIAGNOSIS — R269 Unspecified abnormalities of gait and mobility: Secondary | ICD-10-CM | POA: Diagnosis not present

## 2016-06-30 DIAGNOSIS — R262 Difficulty in walking, not elsewhere classified: Secondary | ICD-10-CM | POA: Diagnosis not present

## 2016-06-30 DIAGNOSIS — I509 Heart failure, unspecified: Secondary | ICD-10-CM | POA: Diagnosis not present

## 2016-07-02 ENCOUNTER — Encounter: Payer: Self-pay | Admitting: Internal Medicine

## 2016-07-04 DIAGNOSIS — R262 Difficulty in walking, not elsewhere classified: Secondary | ICD-10-CM | POA: Diagnosis not present

## 2016-07-04 DIAGNOSIS — I482 Chronic atrial fibrillation: Secondary | ICD-10-CM | POA: Diagnosis not present

## 2016-07-04 DIAGNOSIS — E1149 Type 2 diabetes mellitus with other diabetic neurological complication: Secondary | ICD-10-CM | POA: Diagnosis not present

## 2016-07-04 DIAGNOSIS — I509 Heart failure, unspecified: Secondary | ICD-10-CM | POA: Diagnosis not present

## 2016-07-04 DIAGNOSIS — R269 Unspecified abnormalities of gait and mobility: Secondary | ICD-10-CM | POA: Diagnosis not present

## 2016-07-05 ENCOUNTER — Ambulatory Visit (INDEPENDENT_AMBULATORY_CARE_PROVIDER_SITE_OTHER): Payer: Medicare Other | Admitting: General Practice

## 2016-07-05 ENCOUNTER — Encounter: Payer: Self-pay | Admitting: Internal Medicine

## 2016-07-05 DIAGNOSIS — E1149 Type 2 diabetes mellitus with other diabetic neurological complication: Secondary | ICD-10-CM | POA: Diagnosis not present

## 2016-07-05 DIAGNOSIS — R269 Unspecified abnormalities of gait and mobility: Secondary | ICD-10-CM | POA: Diagnosis not present

## 2016-07-05 DIAGNOSIS — I482 Chronic atrial fibrillation: Secondary | ICD-10-CM | POA: Diagnosis not present

## 2016-07-05 DIAGNOSIS — R262 Difficulty in walking, not elsewhere classified: Secondary | ICD-10-CM | POA: Diagnosis not present

## 2016-07-05 DIAGNOSIS — I509 Heart failure, unspecified: Secondary | ICD-10-CM | POA: Diagnosis not present

## 2016-07-05 DIAGNOSIS — Z5181 Encounter for therapeutic drug level monitoring: Secondary | ICD-10-CM

## 2016-07-05 DIAGNOSIS — Z7901 Long term (current) use of anticoagulants: Secondary | ICD-10-CM

## 2016-07-05 LAB — POCT INR: INR: 3.5

## 2016-07-05 NOTE — Patient Instructions (Signed)
Pre visit review using our clinic review tool, if applicable. No additional management support is needed unless otherwise documented below in the visit note. 

## 2016-07-06 ENCOUNTER — Encounter (HOSPITAL_COMMUNITY): Payer: Self-pay | Admitting: Emergency Medicine

## 2016-07-06 ENCOUNTER — Emergency Department (HOSPITAL_COMMUNITY)
Admission: EM | Admit: 2016-07-06 | Discharge: 2016-07-06 | Disposition: A | Discharge status: Home / Self Care | Payer: Medicare Other | Attending: Emergency Medicine | Admitting: Emergency Medicine

## 2016-07-06 ENCOUNTER — Emergency Department (HOSPITAL_COMMUNITY): Payer: Medicare Other

## 2016-07-06 ENCOUNTER — Ambulatory Visit: Payer: Medicare Other | Admitting: Internal Medicine

## 2016-07-06 DIAGNOSIS — E039 Hypothyroidism, unspecified: Secondary | ICD-10-CM | POA: Diagnosis not present

## 2016-07-06 DIAGNOSIS — Z7984 Long term (current) use of oral hypoglycemic drugs: Secondary | ICD-10-CM | POA: Insufficient documentation

## 2016-07-06 DIAGNOSIS — S20219A Contusion of unspecified front wall of thorax, initial encounter: Secondary | ICD-10-CM

## 2016-07-06 DIAGNOSIS — S0191XA Laceration without foreign body of unspecified part of head, initial encounter: Secondary | ICD-10-CM | POA: Diagnosis not present

## 2016-07-06 DIAGNOSIS — S0990XA Unspecified injury of head, initial encounter: Secondary | ICD-10-CM | POA: Diagnosis present

## 2016-07-06 DIAGNOSIS — Z7901 Long term (current) use of anticoagulants: Secondary | ICD-10-CM | POA: Diagnosis not present

## 2016-07-06 DIAGNOSIS — Y999 Unspecified external cause status: Secondary | ICD-10-CM | POA: Insufficient documentation

## 2016-07-06 DIAGNOSIS — Y92009 Unspecified place in unspecified non-institutional (private) residence as the place of occurrence of the external cause: Secondary | ICD-10-CM | POA: Diagnosis not present

## 2016-07-06 DIAGNOSIS — W1839XA Other fall on same level, initial encounter: Secondary | ICD-10-CM | POA: Diagnosis not present

## 2016-07-06 DIAGNOSIS — E1140 Type 2 diabetes mellitus with diabetic neuropathy, unspecified: Secondary | ICD-10-CM | POA: Diagnosis not present

## 2016-07-06 DIAGNOSIS — Y9389 Activity, other specified: Secondary | ICD-10-CM | POA: Diagnosis not present

## 2016-07-06 DIAGNOSIS — S0101XA Laceration without foreign body of scalp, initial encounter: Secondary | ICD-10-CM | POA: Diagnosis not present

## 2016-07-06 DIAGNOSIS — R55 Syncope and collapse: Secondary | ICD-10-CM | POA: Diagnosis not present

## 2016-07-06 DIAGNOSIS — S0003XA Contusion of scalp, initial encounter: Secondary | ICD-10-CM | POA: Diagnosis not present

## 2016-07-06 DIAGNOSIS — Z950 Presence of cardiac pacemaker: Secondary | ICD-10-CM | POA: Diagnosis not present

## 2016-07-06 DIAGNOSIS — I509 Heart failure, unspecified: Secondary | ICD-10-CM | POA: Diagnosis not present

## 2016-07-06 DIAGNOSIS — S20211A Contusion of right front wall of thorax, initial encounter: Secondary | ICD-10-CM | POA: Diagnosis not present

## 2016-07-06 DIAGNOSIS — S20212A Contusion of left front wall of thorax, initial encounter: Secondary | ICD-10-CM | POA: Diagnosis not present

## 2016-07-06 DIAGNOSIS — I252 Old myocardial infarction: Secondary | ICD-10-CM | POA: Diagnosis not present

## 2016-07-06 DIAGNOSIS — I110 Hypertensive heart disease with heart failure: Secondary | ICD-10-CM | POA: Insufficient documentation

## 2016-07-06 DIAGNOSIS — S299XXA Unspecified injury of thorax, initial encounter: Secondary | ICD-10-CM | POA: Diagnosis not present

## 2016-07-06 DIAGNOSIS — R259 Unspecified abnormal involuntary movements: Secondary | ICD-10-CM | POA: Diagnosis not present

## 2016-07-06 DIAGNOSIS — W19XXXA Unspecified fall, initial encounter: Secondary | ICD-10-CM

## 2016-07-06 LAB — BASIC METABOLIC PANEL
ANION GAP: 12 (ref 5–15)
BUN: 15 mg/dL (ref 6–20)
CO2: 27 mmol/L (ref 22–32)
Calcium: 9.3 mg/dL (ref 8.9–10.3)
Chloride: 98 mmol/L — ABNORMAL LOW (ref 101–111)
Creatinine, Ser: 1.24 mg/dL — ABNORMAL HIGH (ref 0.44–1.00)
GFR calc Af Amer: 44 mL/min — ABNORMAL LOW (ref >60)
GFR calc non Af Amer: 38 mL/min — ABNORMAL LOW (ref >60)
GLUCOSE: 142 mg/dL — AB (ref 65–99)
POTASSIUM: 3.5 mmol/L (ref 3.5–5.1)
Sodium: 137 mmol/L (ref 135–145)

## 2016-07-06 LAB — URINALYSIS, ROUTINE W REFLEX MICROSCOPIC
Bilirubin Urine: NEGATIVE
Glucose, UA: NEGATIVE mg/dL
Hgb urine dipstick: NEGATIVE
Ketones, ur: NEGATIVE mg/dL
Nitrite: NEGATIVE
PROTEIN: NEGATIVE mg/dL
Specific Gravity, Urine: 1.005 (ref 1.005–1.030)
pH: 7 (ref 5.0–8.0)

## 2016-07-06 LAB — CBC
HEMATOCRIT: 33.9 % — AB (ref 36.0–46.0)
HEMOGLOBIN: 10.4 g/dL — AB (ref 12.0–15.0)
MCH: 29.3 pg (ref 26.0–34.0)
MCHC: 30.7 g/dL (ref 30.0–36.0)
MCV: 95.5 fL (ref 78.0–100.0)
Platelets: 138 10*3/uL — ABNORMAL LOW (ref 150–400)
RBC: 3.55 MIL/uL — ABNORMAL LOW (ref 3.87–5.11)
RDW: 15.2 % (ref 11.5–15.5)
WBC: 6.2 10*3/uL (ref 4.0–10.5)

## 2016-07-06 LAB — CBG MONITORING, ED: Glucose-Capillary: 116 mg/dL — ABNORMAL HIGH (ref 65–99)

## 2016-07-06 LAB — I-STAT TROPONIN, ED: TROPONIN I, POC: 0 ng/mL (ref 0.00–0.08)

## 2016-07-06 LAB — PROTIME-INR
INR: 3.16
Prothrombin Time: 33.2 seconds — ABNORMAL HIGH (ref 11.4–15.2)

## 2016-07-06 MED ORDER — LIDOCAINE-EPINEPHRINE (PF) 2 %-1:200000 IJ SOLN
10.0000 mL | Freq: Once | INTRAMUSCULAR | Status: AC
  Administered 2016-07-06: 10 mL
  Filled 2016-07-06: qty 20

## 2016-07-06 MED ORDER — SODIUM CHLORIDE 0.9 % IV BOLUS (SEPSIS)
500.0000 mL | Freq: Once | INTRAVENOUS | Status: AC
  Administered 2016-07-06: 500 mL via INTRAVENOUS

## 2016-07-06 NOTE — ED Triage Notes (Signed)
Pt arrives from home via [facility] reporting fall today around 1330.  Pt reports room spinning before fall, denies CP, SOB. Pt takes coumadin.  Lac and swelling noted to head, brusing noted to lower back.  Possible shortening noted to LLE.  EMS unable to verify baseline mental status.  PT AOx2, disoriented to time and situation.

## 2016-07-06 NOTE — Discharge Instructions (Signed)
Follow-up with her primary care doctor next week, return to the emergency room as needed for any worsening or recurrent symptoms, please make sure to use your walker

## 2016-07-06 NOTE — ED Notes (Signed)
Patient transported to CT 

## 2016-07-06 NOTE — ED Provider Notes (Signed)
[facility]-EMERGENCY DEPT Provider Note   CSN: [id number] Arrival date & time: 07/06/16  1424   History   Chief Complaint Chief Complaint  Patient presents with  . Fall  . Loss of Consciousness    HPI [name] is a 83 y.o. female.  HPI The patient presents to the emergency room after a possible syncopal episode and fall at home. The patient was at home with her granddaughter. She was doing work around the house trying to clean up. Patient was bending down and when she went to lift up her head she says she thinks she lost consciousness. However is not sure that she did. She was in the same room with her. She heard her fall and call for help. If there was loss of consciousness when more than a couple of seconds. Patient did hit her head and her back. She is on Coumadin so she came to the emergency room for evaluation. She denies any headache now. She denies any trouble with chest pain or shortness of breath. No abdominal pain. She denies any focal numbness or weakness. Past Medical History:  Diagnosis Date  . Anxiety   . Atrial fibrillation (HCC)   . Atrial fibrillation (HCC)   . CHF (congestive heart failure) (HCC)   . Constipation   . Diverticulosis of colon   . DM (diabetes mellitus) (HCC)   . Hyperlipidemia   . Hypertension   . Hypothyroidism   . Osteoporosis   . Pain in left ear   . Thyroid disease   . Tremor, essential   . Type II or unspecified type diabetes mellitus with neurological manifestations, not stated as uncontrolled(250.60)     Patient Active Problem List   Diagnosis Date Noted  . CHB (complete heart block) (HCC) post AVN ablation 04/22/2016  . PAH (pulmonary artery hypertension) 04/22/2016  . NSTEMI (non-ST elevated myocardial infarction) (HCC) 04/20/2016  . Abdominal pain   . Encounter for therapeutic drug monitoring 09/04/2013  . Long term current use of anticoagulant therapy 01/06/2013  . Renal insufficiency 09/12/2010  . Dvt femoral (deep venous  thrombosis) (HCC) 09/08/2010  . Hearing loss 11/08/2009  . BACK PAIN 09/06/2009  . PACEMAKER, PERMANENT 10/13/2008  . OSTEOPOROSIS 07/05/2007  . Dyslipidemia 05/03/2007  . TREMOR, ESSENTIAL 02/26/2007  . Congestive heart failure (HCC) 01/26/2007  . Hypothyroidism 01/02/2007  . Diabetes mellitus with neuropathy (HCC) 01/02/2007  . Anxiety state 01/02/2007  . Essential hypertension 01/02/2007  . ATRIAL FIBRILLATION 01/02/2007  . DIVERTICULOSIS, COLON 01/02/2007    Past Surgical History:  Procedure Laterality Date  . CARDIAC CATHETERIZATION N/A 04/20/2016   Procedure: Left Heart Cath and Coronary Angiography;  Surgeon: Yvonne Kendall, MD;  Location: [facility] INVASIVE CV LAB;  Service: Cardiovascular;  Laterality: N/A;  . CARDIAC CATHETERIZATION N/A 04/24/2016   Procedure: Coronary Stent Intervention;  Surgeon: Lennette Bihari, MD;  Location: [facility] INVASIVE CV LAB;  Service: Cardiovascular;  Laterality: N/A;  . CARDIAC CATHETERIZATION N/A 04/24/2016   Procedure: Coronary Balloon Angioplasty;  Surgeon: Lennette Bihari, MD;  Location: [facility] INVASIVE CV LAB;  Service: Cardiovascular;  Laterality: N/A;  . CARDIAC CATHETERIZATION N/A 04/26/2016   Procedure: Coronary Stent Intervention;  Surgeon: Iran Ouch, MD;  Location: [facility] INVASIVE CV LAB;  Service: Cardiovascular;  Laterality: N/A;  . CATARACT EXTRACTION  2002  . L3 compression fraction    . PACEMAKER PLACEMENT    . TRANSTHORACIC ECHOCARDIOGRAM  2011, 2012    OB History    No data available  Home Medications    Prior to Admission medications   Medication Sig Start Date End Date Taking? Authorizing Provider  atorvastatin (LIPITOR) 40 MG tablet Take 1 tablet (40 mg total) by mouth daily at 6 PM. Patient taking differently: Take 40 mg by mouth daily.  05/01/16  Yes Arty Baumgartner, NP  clopidogrel (PLAVIX) 75 MG tablet Take 1 tablet (75 mg total) by mouth daily. 05/02/16  Yes Arty Baumgartner, NP  diazepam (VALIUM) 5 MG tablet take  1 tablet by mouth twice a day if needed for anxiety and AGITATION 06/13/16  Yes Gordy Savers, MD  ferrous sulfate 325 (65 FE) MG tablet take 1 tablet by mouth once daily 03/31/16  Yes Gordy Savers, MD  furosemide (LASIX) 40 MG tablet Take 40 mg by mouth daily.   Yes Historical Provider, MD  hyoscyamine (LEVBID) 0.375 MG 12 hr tablet take 1 tablet by mouth twice a day ONLY IF NEEDED  **DO NOT TAKE EVERYDAY MUST LAST 30 DAYS** Patient taking differently: TAKE 1 TABLET BY MOUTH EVERY OTHER NIGHT - MUST LAST 30 DAYS 12/20/15  Yes Gordy Savers, MD  levalbuterol Pauline Aus) 0.63 MG/3ML nebulizer solution Take 0.63 mg by nebulization every 6 (six) hours as needed for wheezing or shortness of breath.   Yes Historical Provider, MD  levothyroxine (SYNTHROID, LEVOTHROID) 75 MCG tablet take 1 tablet by mouth once daily 03/31/16  Yes Gordy Savers, MD  metFORMIN (GLUCOPHAGE-XR) 500 MG 24 hr tablet Take 1 tablet (500 mg total) by mouth daily with breakfast. 11/26/15  Yes Gordy Savers, MD  polyethylene glycol powder (GLYCOLAX/MIRALAX) powder Take 17 g by mouth daily as needed (constipation). Mix in 8 oz liquid and drink   Yes Historical Provider, MD  potassium chloride (K-DUR,KLOR-CON) 10 MEQ tablet take 1 tablet by mouth once daily 05/30/16  Yes Gordy Savers, MD  traMADol (ULTRAM) 50 MG tablet Take 1 tablet (50 mg total) by mouth every 6 (six) hours as needed for moderate pain. Patient taking differently: Take 50 mg by mouth daily as needed for moderate pain.  10/26/15  Yes Nelwyn Salisbury, MD  warfarin (COUMADIN) 2 MG tablet Take 2 mg by mouth daily.   Yes Historical Provider, MD  CARTIA XT 300 MG 24 hr capsule take 1 capsule by mouth once daily Patient not taking: Reported on 07/06/2016 03/09/16   Gordy Savers, MD  furosemide (LASIX) 20 MG tablet take 1 tablet by mouth once daily Patient not taking: Reported on 06/11/2016 01/04/16   Gordy Savers, MD  furosemide (LASIX) 40  MG tablet take 1 tablet by mouth once daily Patient not taking: Reported on 07/06/2016 06/12/16   Gordy Savers, MD  warfarin (COUMADIN) 2 MG tablet Take as directed by anticoagulation clinic Patient not taking: Reported on 07/06/2016 06/05/16   Gordy Savers, MD    Family History Family History  Problem Relation Age of Onset  . Heart attack Father   . Colon cancer Neg Hx     Social History Social History  Substance Use Topics  . Smoking status: Never Smoker  . Smokeless tobacco: Never Used  . Alcohol use No     Allergies   Codeine sulfate and Simvastatin   Review of Systems Review of Systems  All other systems reviewed and are negative.    Physical Exam Updated Vital Signs BP 115/59   Pulse 72   Resp 17   Ht 5\' 8"  (1.727 m)   Wt  65.8 kg   SpO2 98%   BMI 22.05 kg/m   Physical Exam  Constitutional: No distress.  HENT:  Head: Normocephalic.  Right Ear: External ear normal.  Left Ear: External ear normal.  Small less than 1 cm laceration left parietal scalp  Eyes: Conjunctivae are normal. Right eye exhibits no discharge. Left eye exhibits no discharge. No scleral icterus.  Neck: Neck supple. No tracheal deviation present.  Cardiovascular: Normal rate, regular rhythm and intact distal pulses.   Pulmonary/Chest: Effort normal and breath sounds normal. No stridor. No respiratory distress. She has no wheezes. She has no rales.  Large bruises bilateral inferior posterior ribs, hematoma posterior left flank  Abdominal: Soft. Bowel sounds are normal. She exhibits no distension. There is no tenderness. There is no rebound and no guarding.  Musculoskeletal: She exhibits edema (trace edema legs). She exhibits no tenderness.  Neurological: She is alert. She has normal strength. No cranial nerve deficit (no facial droop, extraocular movements intact, no slurred speech) or sensory deficit. She exhibits normal muscle tone. She displays no seizure activity. Coordination  normal.  Skin: Skin is warm and dry. No rash noted. She is not diaphoretic.  Psychiatric: She has a normal mood and affect.  Nursing note and vitals reviewed.    ED Treatments / Results  Labs (all labs ordered are listed, but only abnormal results are displayed) Labs Reviewed  BASIC METABOLIC PANEL - Abnormal; Notable for the following:       Result Value   Chloride 98 (*)    Glucose, Bld 142 (*)    Creatinine, Ser 1.24 (*)    GFR calc non Af Amer 38 (*)    GFR calc Af Amer 44 (*)    All other components within normal limits  CBC - Abnormal; Notable for the following:    RBC 3.55 (*)    Hemoglobin 10.4 (*)    HCT 33.9 (*)    Platelets 138 (*)    All other components within normal limits  URINALYSIS, ROUTINE W REFLEX MICROSCOPIC - Abnormal; Notable for the following:    Leukocytes, UA SMALL (*)    Bacteria, UA RARE (*)    Squamous Epithelial / LPF 0-5 (*)    All other components within normal limits  PROTIME-INR - Abnormal; Notable for the following:    Prothrombin Time 33.2 (*)    All other components within normal limits  CBG MONITORING, ED - Abnormal; Notable for the following:    Glucose-Capillary 116 (*)    All other components within normal limits  I-STAT TROPOININ, ED    EKG  EKG Interpretation None       Radiology Dg Ribs Bilateral W/chest  Result Date: 07/06/2016 CLINICAL DATA:  Larey Seat today and injured ribs. EXAM: BILATERAL RIBS AND CHEST - 4+ VIEW COMPARISON:  Chest x-ray a 06/11/2016 and prior chest CT 05/14/2016 FINDINGS: The pacer wires are in good position and stable. Stable cardiac enlargement and tortuous calcified thoracic aorta. The lungs are clear. No pulmonary contusion, pleural effusion or pneumothorax. Dedicated views of the ribs do not demonstrate any definite acute rib fractures. Remote healed tenth right rib fracture. IMPRESSION: No definite acute rib fractures and no acute cardiopulmonary findings. Stable cardiac enlargement. Electronically  Signed   By: Rudie Meyer M.D.   On: 07/06/2016 17:50   Ct Head Wo Contrast  Result Date: 07/06/2016 CLINICAL DATA:  Recent fall EXAM: CT HEAD WITHOUT CONTRAST TECHNIQUE: Contiguous axial images were obtained from the base of the skull  through the vertex without intravenous contrast. COMPARISON:  11/22/2014 FINDINGS: Brain: Mild atrophic changes are noted. No findings to suggest acute hemorrhage, acute infarction or space-occupying mass lesion are noted. Vascular: No hyperdense vessel or unexpected calcification. Skull: Normal. Negative for fracture or focal lesion. Sinuses/Orbits: Air-fluid level is noted within the sphenoid sinus. Other: Left frontoparietal scalp hematoma is noted consistent with the recent injury. IMPRESSION: Scalp hematoma.  No acute intracranial abnormality is noted. Air-fluid level within the sphenoid sinus. Electronically Signed   By: Alcide CleverMark  Lukens M.D.   On: 07/06/2016 16:05    Procedures Procedures (including critical care time)  Medications Ordered in ED Medications  sodium chloride 0.9 % bolus 500 mL (0 mLs Intravenous Stopped 07/06/16 1739)  lidocaine-EPINEPHrine (XYLOCAINE W/EPI) 2 %-1:200000 (PF) injection 10 mL (10 mLs Infiltration Given 07/06/16 1732)     Initial Impression / Assessment and Plan / ED Course  I have reviewed the triage vital signs and the nursing notes.  Pertinent labs & imaging results that were available during my care of the patient were reviewed by me and considered in my medical decision making (see chart for details).  Clinical Course as of Jul 06 1844  Thu Jul 06, 2016  1636 Hgb 10.3 in december  [JK]  1636 Cr slightly higher than previously  [JK]    Clinical Course User Index [JK] Linwood DibblesJon Veron Senner, MD   Patient presented to the emergency room after a fall at home today. Her symptoms are suggestive of a near syncopal orthostatic hypotension episode. Patient was sitting down when she suddenly stood up she felt lightheaded. Laboratory tests  are reassuring.  CT scan of the head does not show any intracranial bleeding.  Chest x-ray does not show any evidence of rib fractures or pneumothorax.  Patient is feeling well and she would like to go home. The daughter states the patient has been told the past that she is supposed to use her walker. She does not like to and often does not use it. I explained to her the importance of using her walker at home. She understands to return to the emergency for any worsening symptoms or recurrent episodes. Otherwise follow-up with her doctor next week  Final Clinical Impressions(s) / ED Diagnoses   Final diagnoses:  Fall  Contusion of chest wall, unspecified laterality, initial encounter  Near syncope      Linwood DibblesJon Avo Schlachter, MD 07/06/16 [phone]

## 2016-07-06 NOTE — ED Notes (Signed)
Patient transported to X-ray 

## 2016-07-06 NOTE — ED Notes (Signed)
Pt really doesn't want to be sutck again. IV had infiltrated. MD made aware. Advised not to stick again unless pressures were down. Pressures were fine

## 2016-07-06 NOTE — ED Notes (Signed)
Pts pacemaker has been interrogated.

## 2016-07-08 ENCOUNTER — Emergency Department (HOSPITAL_COMMUNITY): Payer: Medicare Other

## 2016-07-08 ENCOUNTER — Encounter (HOSPITAL_COMMUNITY): Payer: Self-pay | Admitting: Emergency Medicine

## 2016-07-08 ENCOUNTER — Emergency Department (HOSPITAL_COMMUNITY)
Admission: EM | Admit: 2016-07-08 | Discharge: 2016-07-08 | Disposition: A | Discharge status: Home / Self Care | Payer: Medicare Other | Attending: Emergency Medicine | Admitting: Emergency Medicine

## 2016-07-08 DIAGNOSIS — R109 Unspecified abdominal pain: Secondary | ICD-10-CM | POA: Diagnosis not present

## 2016-07-08 DIAGNOSIS — R52 Pain, unspecified: Secondary | ICD-10-CM | POA: Diagnosis not present

## 2016-07-08 DIAGNOSIS — M545 Low back pain, unspecified: Secondary | ICD-10-CM

## 2016-07-08 DIAGNOSIS — Y929 Unspecified place or not applicable: Secondary | ICD-10-CM | POA: Insufficient documentation

## 2016-07-08 DIAGNOSIS — I110 Hypertensive heart disease with heart failure: Secondary | ICD-10-CM | POA: Insufficient documentation

## 2016-07-08 DIAGNOSIS — W1830XA Fall on same level, unspecified, initial encounter: Secondary | ICD-10-CM | POA: Insufficient documentation

## 2016-07-08 DIAGNOSIS — Z79899 Other long term (current) drug therapy: Secondary | ICD-10-CM | POA: Diagnosis not present

## 2016-07-08 DIAGNOSIS — Y999 Unspecified external cause status: Secondary | ICD-10-CM | POA: Diagnosis not present

## 2016-07-08 DIAGNOSIS — M549 Dorsalgia, unspecified: Secondary | ICD-10-CM

## 2016-07-08 DIAGNOSIS — Z7901 Long term (current) use of anticoagulants: Secondary | ICD-10-CM | POA: Insufficient documentation

## 2016-07-08 DIAGNOSIS — S3992XA Unspecified injury of lower back, initial encounter: Secondary | ICD-10-CM | POA: Diagnosis not present

## 2016-07-08 DIAGNOSIS — M546 Pain in thoracic spine: Secondary | ICD-10-CM | POA: Diagnosis not present

## 2016-07-08 DIAGNOSIS — Y939 Activity, unspecified: Secondary | ICD-10-CM | POA: Diagnosis not present

## 2016-07-08 DIAGNOSIS — E039 Hypothyroidism, unspecified: Secondary | ICD-10-CM | POA: Diagnosis not present

## 2016-07-08 DIAGNOSIS — Z950 Presence of cardiac pacemaker: Secondary | ICD-10-CM | POA: Insufficient documentation

## 2016-07-08 DIAGNOSIS — E1140 Type 2 diabetes mellitus with diabetic neuropathy, unspecified: Secondary | ICD-10-CM | POA: Diagnosis not present

## 2016-07-08 DIAGNOSIS — I509 Heart failure, unspecified: Secondary | ICD-10-CM | POA: Diagnosis not present

## 2016-07-08 LAB — I-STAT CHEM 8, ED
BUN: 22 mg/dL — ABNORMAL HIGH (ref 6–20)
CALCIUM ION: 1.16 mmol/L (ref 1.15–1.40)
CHLORIDE: 102 mmol/L (ref 101–111)
Creatinine, Ser: 1 mg/dL (ref 0.44–1.00)
Glucose, Bld: 142 mg/dL — ABNORMAL HIGH (ref 65–99)
HCT: 29 % — ABNORMAL LOW (ref 36.0–46.0)
HEMOGLOBIN: 9.9 g/dL — AB (ref 12.0–15.0)
Potassium: 3.5 mmol/L (ref 3.5–5.1)
SODIUM: 141 mmol/L (ref 135–145)
TCO2: 28 mmol/L (ref 0–100)

## 2016-07-08 LAB — CBC WITH DIFFERENTIAL/PLATELET
Basophils Absolute: 0 10*3/uL (ref 0.0–0.1)
Basophils Relative: 0 %
EOS PCT: 1 %
Eosinophils Absolute: 0 10*3/uL (ref 0.0–0.7)
HEMATOCRIT: 30.7 % — AB (ref 36.0–46.0)
Hemoglobin: 9.6 g/dL — ABNORMAL LOW (ref 12.0–15.0)
LYMPHS ABS: 1.6 10*3/uL (ref 0.7–4.0)
LYMPHS PCT: 23 %
MCH: 29.7 pg (ref 26.0–34.0)
MCHC: 31.3 g/dL (ref 30.0–36.0)
MCV: 95 fL (ref 78.0–100.0)
Monocytes Absolute: 0.4 10*3/uL (ref 0.1–1.0)
Monocytes Relative: 5 %
NEUTROS ABS: 5 10*3/uL (ref 1.7–7.7)
Neutrophils Relative %: 71 %
PLATELETS: 137 10*3/uL — AB (ref 150–400)
RBC: 3.23 MIL/uL — AB (ref 3.87–5.11)
RDW: 15.7 % — AB (ref 11.5–15.5)
WBC: 7.1 10*3/uL (ref 4.0–10.5)

## 2016-07-08 LAB — COMPREHENSIVE METABOLIC PANEL
ALT: 14 U/L (ref 14–54)
AST: 26 U/L (ref 15–41)
Albumin: 3.2 g/dL — ABNORMAL LOW (ref 3.5–5.0)
Alkaline Phosphatase: 75 U/L (ref 38–126)
Anion gap: 11 (ref 5–15)
BILIRUBIN TOTAL: 1 mg/dL (ref 0.3–1.2)
BUN: 16 mg/dL (ref 6–20)
CALCIUM: 9.2 mg/dL (ref 8.9–10.3)
CHLORIDE: 103 mmol/L (ref 101–111)
CO2: 26 mmol/L (ref 22–32)
CREATININE: 1.05 mg/dL — AB (ref 0.44–1.00)
GFR, EST AFRICAN AMERICAN: 54 mL/min — AB (ref >60)
GFR, EST NON AFRICAN AMERICAN: 46 mL/min — AB (ref >60)
Glucose, Bld: 145 mg/dL — ABNORMAL HIGH (ref 65–99)
Potassium: 3.5 mmol/L (ref 3.5–5.1)
Sodium: 140 mmol/L (ref 135–145)
TOTAL PROTEIN: 6 g/dL — AB (ref 6.5–8.1)

## 2016-07-08 LAB — LIPASE, BLOOD: LIPASE: 18 U/L (ref 11–51)

## 2016-07-08 MED ORDER — IOPAMIDOL (ISOVUE-300) INJECTION 61%
INTRAVENOUS | Status: AC
  Administered 2016-07-08: 100 mL
  Filled 2016-07-08: qty 75

## 2016-07-08 MED ORDER — MORPHINE SULFATE 15 MG PO TABS: 15.0000 mg | ORAL_TABLET | ORAL | 0 refills | Status: DC | PRN

## 2016-07-08 MED ORDER — MORPHINE SULFATE (PF) 4 MG/ML IV SOLN
2.0000 mg | Freq: Once | INTRAVENOUS | Status: AC
  Administered 2016-07-08: 2 mg via INTRAVENOUS
  Filled 2016-07-08: qty 1

## 2016-07-08 MED ORDER — ONDANSETRON HCL 4 MG/2ML IJ SOLN
4.0000 mg | Freq: Once | INTRAMUSCULAR | Status: AC
  Administered 2016-07-08: 4 mg via INTRAVENOUS
  Filled 2016-07-08: qty 2

## 2016-07-08 MED ORDER — ACETAMINOPHEN 500 MG PO TABS
1000.0000 mg | ORAL_TABLET | Freq: Once | ORAL | Status: AC
  Administered 2016-07-08: 1000 mg via ORAL
  Filled 2016-07-08: qty 2

## 2016-07-08 NOTE — ED Provider Notes (Signed)
[facility]-EMERGENCY DEPT Provider Note   CSN: [id number] Arrival date & time: 07/08/16  0610     History   Chief Complaint Chief Complaint  Patient presents with  . Back Pain    HPI [name] is a 83 y.o. female.  83 yo F with a chief complaint of left flank pain. Patient had a mechanical fall couple days ago was seen in the emergency department. At that time she is complaining of some chest wall pain had an x-ray that was negative and was discharged home. Patient woke up last night complaining of severe left-sided low back pain. Denies radiation. Denies weakness or numbness to her legs. Has been able to ambulate without difficulty. Denies abdominal pain.   The history is provided by the patient.  Back Pain   This is a new problem. The current episode started 2 days ago. The problem occurs constantly. The problem has not changed since onset.The pain is associated with falling. The pain is present in the lumbar spine. The quality of the pain is described as stabbing and shooting. The pain does not radiate. The pain is at a severity of 8/10. The pain is moderate. The symptoms are aggravated by bending, twisting and certain positions. Pertinent negatives include no chest pain, no fever, no headaches and no dysuria. She has tried nothing for the symptoms. The treatment provided no relief.    Past Medical History:  Diagnosis Date  . Anxiety   . Atrial fibrillation (HCC)   . Atrial fibrillation (HCC)   . CHF (congestive heart failure) (HCC)   . Constipation   . Diverticulosis of colon   . DM (diabetes mellitus) (HCC)   . Hyperlipidemia   . Hypertension   . Hypothyroidism   . Osteoporosis   . Pain in left ear   . Thyroid disease   . Tremor, essential   . Type II or unspecified type diabetes mellitus with neurological manifestations, not stated as uncontrolled(250.60)     Patient Active Problem List   Diagnosis Date Noted  . CHB (complete heart block) (HCC) post AVN ablation  04/22/2016  . PAH (pulmonary artery hypertension) 04/22/2016  . NSTEMI (non-ST elevated myocardial infarction) (HCC) 04/20/2016  . Abdominal pain   . Encounter for therapeutic drug monitoring 09/04/2013  . Long term current use of anticoagulant therapy 01/06/2013  . Renal insufficiency 09/12/2010  . Dvt femoral (deep venous thrombosis) (HCC) 09/08/2010  . Hearing loss 11/08/2009  . BACK PAIN 09/06/2009  . PACEMAKER, PERMANENT 10/13/2008  . OSTEOPOROSIS 07/05/2007  . Dyslipidemia 05/03/2007  . TREMOR, ESSENTIAL 02/26/2007  . Congestive heart failure (HCC) 01/26/2007  . Hypothyroidism 01/02/2007  . Diabetes mellitus with neuropathy (HCC) 01/02/2007  . Anxiety state 01/02/2007  . Essential hypertension 01/02/2007  . ATRIAL FIBRILLATION 01/02/2007  . DIVERTICULOSIS, COLON 01/02/2007    Past Surgical History:  Procedure Laterality Date  . CARDIAC CATHETERIZATION N/A 04/20/2016   Procedure: Left Heart Cath and Coronary Angiography;  Surgeon: Yvonne Kendall, MD;  Location: [facility] INVASIVE CV LAB;  Service: Cardiovascular;  Laterality: N/A;  . CARDIAC CATHETERIZATION N/A 04/24/2016   Procedure: Coronary Stent Intervention;  Surgeon: Lennette Bihari, MD;  Location: [facility] INVASIVE CV LAB;  Service: Cardiovascular;  Laterality: N/A;  . CARDIAC CATHETERIZATION N/A 04/24/2016   Procedure: Coronary Balloon Angioplasty;  Surgeon: Lennette Bihari, MD;  Location: [facility] INVASIVE CV LAB;  Service: Cardiovascular;  Laterality: N/A;  . CARDIAC CATHETERIZATION N/A 04/26/2016   Procedure: Coronary Stent Intervention;  Surgeon: Iran Ouch,  MD;  Location: [facility] INVASIVE CV LAB;  Service: Cardiovascular;  Laterality: N/A;  . CATARACT EXTRACTION  2002  . L3 compression fraction    . PACEMAKER PLACEMENT    . TRANSTHORACIC ECHOCARDIOGRAM  2011, 2012    OB History    No data available       Home Medications    Prior to Admission medications   Medication Sig Start Date End Date Taking? Authorizing Provider    atorvastatin (LIPITOR) 40 MG tablet Take 1 tablet (40 mg total) by mouth daily at 6 PM. Patient taking differently: Take 40 mg by mouth daily.  05/01/16   Arty Baumgartner, NP  CARTIA XT 300 MG 24 hr capsule take 1 capsule by mouth once daily Patient not taking: Reported on 07/06/2016 03/09/16   Gordy Savers, MD  clopidogrel (PLAVIX) 75 MG tablet Take 1 tablet (75 mg total) by mouth daily. 05/02/16   Arty Baumgartner, NP  diazepam (VALIUM) 5 MG tablet take 1 tablet by mouth twice a day if needed for anxiety and AGITATION 06/13/16   Gordy Savers, MD  ferrous sulfate 325 (65 FE) MG tablet take 1 tablet by mouth once daily 03/31/16   Gordy Savers, MD  furosemide (LASIX) 20 MG tablet take 1 tablet by mouth once daily Patient not taking: Reported on 06/11/2016 01/04/16   Gordy Savers, MD  furosemide (LASIX) 40 MG tablet take 1 tablet by mouth once daily Patient not taking: Reported on 07/06/2016 06/12/16   Gordy Savers, MD  furosemide (LASIX) 40 MG tablet Take 40 mg by mouth daily.    Historical Provider, MD  hyoscyamine (LEVBID) 0.375 MG 12 hr tablet take 1 tablet by mouth twice a day ONLY IF NEEDED  **DO NOT TAKE EVERYDAY MUST LAST 30 DAYS** Patient taking differently: TAKE 1 TABLET BY MOUTH EVERY OTHER NIGHT - MUST LAST 30 DAYS 12/20/15   Gordy Savers, MD  levalbuterol Pauline Aus) 0.63 MG/3ML nebulizer solution Take 0.63 mg by nebulization every 6 (six) hours as needed for wheezing or shortness of breath.    Historical Provider, MD  levothyroxine (SYNTHROID, LEVOTHROID) 75 MCG tablet take 1 tablet by mouth once daily 03/31/16   Gordy Savers, MD  metFORMIN (GLUCOPHAGE-XR) 500 MG 24 hr tablet Take 1 tablet (500 mg total) by mouth daily with breakfast. 11/26/15   Gordy Savers, MD  morphine (MSIR) 15 MG tablet Take 1 tablet (15 mg total) by mouth every 4 (four) hours as needed for severe pain. 07/08/16   Melene Plan, DO  polyethylene glycol powder  (GLYCOLAX/MIRALAX) powder Take 17 g by mouth daily as needed (constipation). Mix in 8 oz liquid and drink    Historical Provider, MD  potassium chloride (K-DUR,KLOR-CON) 10 MEQ tablet take 1 tablet by mouth once daily 05/30/16   Gordy Savers, MD  traMADol (ULTRAM) 50 MG tablet Take 1 tablet (50 mg total) by mouth every 6 (six) hours as needed for moderate pain. Patient taking differently: Take 50 mg by mouth daily as needed for moderate pain.  10/26/15   Nelwyn Salisbury, MD  warfarin (COUMADIN) 2 MG tablet Take as directed by anticoagulation clinic Patient not taking: Reported on 07/06/2016 06/05/16   Gordy Savers, MD  warfarin (COUMADIN) 2 MG tablet Take 2 mg by mouth daily.    Historical Provider, MD    Family History Family History  Problem Relation Age of Onset  . Heart attack Father   . Colon cancer  Neg Hx     Social History Social History  Substance Use Topics  . Smoking status: Never Smoker  . Smokeless tobacco: Never Used  . Alcohol use No     Allergies   Codeine sulfate and Simvastatin   Review of Systems Review of Systems  Constitutional: Negative for chills and fever.  HENT: Negative for congestion and rhinorrhea.   Eyes: Negative for redness and visual disturbance.  Respiratory: Negative for shortness of breath and wheezing.   Cardiovascular: Negative for chest pain and palpitations.  Gastrointestinal: Negative for nausea and vomiting.  Genitourinary: Negative for dysuria and urgency.  Musculoskeletal: Positive for back pain. Negative for arthralgias and myalgias.  Skin: Negative for pallor and wound.  Neurological: Negative for dizziness and headaches.     Physical Exam Updated Vital Signs BP 133/67   Pulse 75   Temp 98.1 F (36.7 C) (Oral)   SpO2 96%   Physical Exam  Constitutional: She is oriented to person, place, and time. She appears well-developed and well-nourished. No distress.  HENT:  Head: Normocephalic and atraumatic.  Eyes: EOM  are normal. Pupils are equal, round, and reactive to light.  Neck: Normal range of motion. Neck supple.  Cardiovascular: Normal rate and regular rhythm.  Exam reveals no gallop and no friction rub.   No murmur heard. Pulmonary/Chest: Effort normal. She has no wheezes. She has no rales.  Abdominal: Soft. She exhibits no distension. There is no tenderness.  Musculoskeletal: She exhibits no edema or tenderness.       Back:  Neurological: She is alert and oriented to person, place, and time.  PMS intact to BLE  Skin: Skin is warm and dry. She is not diaphoretic.  Psychiatric: She has a normal mood and affect. Her behavior is normal.  Nursing note and vitals reviewed.    ED Treatments / Results  Labs (all labs ordered are listed, but only abnormal results are displayed) Labs Reviewed  CBC WITH DIFFERENTIAL/PLATELET - Abnormal; Notable for the following:       Result Value   RBC 3.23 (*)    Hemoglobin 9.6 (*)    HCT 30.7 (*)    RDW 15.7 (*)    Platelets 137 (*)    All other components within normal limits  COMPREHENSIVE METABOLIC PANEL - Abnormal; Notable for the following:    Glucose, Bld 145 (*)    Creatinine, Ser 1.05 (*)    Total Protein 6.0 (*)    Albumin 3.2 (*)    GFR calc non Af Amer 46 (*)    GFR calc Af Amer 54 (*)    All other components within normal limits  I-STAT CHEM 8, ED - Abnormal; Notable for the following:    BUN 22 (*)    Glucose, Bld 142 (*)    Hemoglobin 9.9 (*)    HCT 29.0 (*)    All other components within normal limits  LIPASE, BLOOD    EKG  EKG Interpretation None       Radiology Dg Ribs Bilateral W/chest  Result Date: 07/06/2016 CLINICAL DATA:  Larey Seat today and injured ribs. EXAM: BILATERAL RIBS AND CHEST - 4+ VIEW COMPARISON:  Chest x-ray a 06/11/2016 and prior chest CT 05/14/2016 FINDINGS: The pacer wires are in good position and stable. Stable cardiac enlargement and tortuous calcified thoracic aorta. The lungs are clear. No pulmonary  contusion, pleural effusion or pneumothorax. Dedicated views of the ribs do not demonstrate any definite acute rib fractures. Remote healed tenth right  rib fracture. IMPRESSION: No definite acute rib fractures and no acute cardiopulmonary findings. Stable cardiac enlargement. Electronically Signed   By: Rudie MeyerP.  Gallerani M.D.   On: 07/06/2016 17:50   Ct Head Wo Contrast  Result Date: 07/06/2016 CLINICAL DATA:  Recent fall EXAM: CT HEAD WITHOUT CONTRAST TECHNIQUE: Contiguous axial images were obtained from the base of the skull through the vertex without intravenous contrast. COMPARISON:  11/22/2014 FINDINGS: Brain: Mild atrophic changes are noted. No findings to suggest acute hemorrhage, acute infarction or space-occupying mass lesion are noted. Vascular: No hyperdense vessel or unexpected calcification. Skull: Normal. Negative for fracture or focal lesion. Sinuses/Orbits: Air-fluid level is noted within the sphenoid sinus. Other: Left frontoparietal scalp hematoma is noted consistent with the recent injury. IMPRESSION: Scalp hematoma.  No acute intracranial abnormality is noted. Air-fluid level within the sphenoid sinus. Electronically Signed   By: Alcide CleverMark  Lukens M.D.   On: 07/06/2016 16:05   Ct Abdomen Pelvis W Contrast  Result Date: 07/08/2016 CLINICAL DATA:  Recent fall.  Abdominal pain EXAM: CT ABDOMEN AND PELVIS WITH CONTRAST TECHNIQUE: Multidetector CT imaging of the abdomen and pelvis was performed using the standard protocol following bolus administration of intravenous contrast. CONTRAST:  100mL ISOVUE-300 IOPAMIDOL (ISOVUE-300) INJECTION 61% COMPARISON:  CT abdomen and pelvis Oct 06, 2016. Lumbar spine radiographs Oct 10, 2015 FINDINGS: Lower chest: There are bilateral pleural effusions with bibasilar atelectasis. There is cardiomegaly. Pacemaker leads are attached to the right atrium and right ventricle. Visualized pericardium is not thickened. No pneumothorax evident. Hepatobiliary: No focal liver  lesions are evident. No liver laceration or rupture. No perihepatic fluid. The gallbladder is mildly distended. A small gallstone is noted in the gallbladder. The gallbladder wall is not thickened. There is no biliary duct dilatation. Pancreas: No pancreatic mass or inflammatory lesion. No peripancreatic fluid. Spleen: Spleen appears intact without laceration or rupture. No splenic lesions are evident. No perisplenic fluid. Adrenals/Urinary Tract: Adrenals appear unremarkable bilaterally. There is a cyst arising from the lateral mid right kidney measuring 3.7 x 3.7 cm. There is a cyst arising from the posterior mid kidney measuring 1.7 x 1.0 cm. There is a subcentimeter cyst arising from the posterior mid kidney on the right as well. There is a cyst arising from the mid left kidney measuring 1.4 x 1.3 cm. There is a subcentimeter cyst medially in the mid left kidney. There is no renal laceration or rupture. No contrast extravasation. No hydronephrosis. No renal or ureteral calculus on either side. Urinary bladder is midline with wall thickness within normal limits. Stomach/Bowel: There is moderate stool throughout the rectum with mild dilatation of the rectum with stool and air. There is extensive generalized colonic diverticulosis. No diverticulitis is appreciable. There is some slight wall thickening in the sigmoid colon which probably is due to muscular hypertrophy from chronic diverticulosis. There is no surrounding mesenteric thickening. No small bowel wall thickening noted. No bowel obstruction. No free air or portal venous air is demonstrable. Vascular/Lymphatic: There is atherosclerotic calcification throughout the aorta and iliac arteries. There is common femoral artery calcification as well. There is calcification in the proximal major mesenteric arteries without obstruction evident. No aneurysm seen. There is no appreciable adenopathy in the abdomen or pelvis. Reproductive: Uterus is retroverted. There  are calcifications in the uterus consistent with leiomyomas. No pelvic mass or pelvic fluid collection. Other: No periappendiceal region inflammation is evident. No abscess or ascites is appreciable in the abdomen or pelvis. There is no abnormal fluid collections or evidence  of hematoma in the peritoneum or retroperitoneum of the abdomen or pelvis. Musculoskeletal: There is anterior wedging of the T12 vertebral body,, not present on prior lumbar radiograph. There is moderate anterior wedging of the L3 vertebral body, slightly progressed from the 2008 study the not felt to be appreciably changed from more recent lumbar radiographs. There are no blastic or lytic bone lesions. There is stranding in the soft tissues along the lateral pelvis at the level of the proximal left femur laterally. There is edema in the soft tissues immediately adjacent to the left tensor fascia lata muscle. No intramuscular lesion is evident. No other abdominal or pelvic wall soft tissue thickening is evident. There is an old healed fracture of a lower anterior right rib. IMPRESSION: Soft tissue thickening in the lateral left pelvic/upper thigh region, lateral to the tensor fascia lata muscle. Suspect recent hemorrhage in this area, likely from recent fall given the history. There are wedge fractures at T12 and L3. The L3 vertebral body fracture appears stable. The T12 fracture may be more recent. Potentially 1, nuclear medicine bone scan or lumbar MR could be helpful to assess chronicity of fractures. Extensive colonic diverticulosis without frank diverticulitis. No bowel obstruction. No abscess. Leiomyomatous uterus. Mildly distended gallbladder with a small gallstone present. No gallbladder wall thickening. Moderate pleural effusions bilaterally with bibasilar atelectasis. Cardiomegaly. Question a degree of congestive heart failure. No renal or ureteral calculus.  No hydronephrosis. Electronically Signed   By: Bretta Bang III M.D.    On: 07/08/2016 09:31   Ct L-spine No Charge  Result Date: 07/08/2016 CLINICAL DATA:  Low back and hip pain since falling 2 days ago. History of pacemaker. EXAM: CT LUMBAR SPINE WITHOUT CONTRAST TECHNIQUE: Multiplanar CT image reconstructions of the lumbar spine were obtained from the abdominopelvic CT performed earlier on the same date. COMPARISON:  Radiographs 10/10/2015, 07/06/2016 and 06/11/2016. Abdominopelvic CT 10/07/2006 and chest CT 05/14/2016. FINDINGS: Segmentation: There are 5 lumbar type vertebral bodies. Alignment: Normal. Vertebrae: There is a chronic superior endplate compression deformity at L3 which has mildly progressed from 2008. There is minimal osseous retropulsion. No acute findings are seen within the lumbar spine. As previously noted, there is a superior endplate compression fracture at T12 associated with approximately 4 mm of osseous retropulsion and 50% loss of vertebral body height. This fracture has developed since the lumbar spine radiographs of 10/10/2015, although appears unchanged from more recent chest radiographs and chest CT. The posterior elements are intact. Sacral Tarlov cysts noted. Paraspinal and other soft tissues: No large paraspinal hematoma demonstrated. As described on accompanying CT, there are bilateral pleural effusions, atherosclerosis and bilateral renal cysts. Disc levels: Relatively mild spondylosis with preserved disc height. There is no large disc herniation, significant spinal stenosis or nerve root encroachment. There is moderate facet hypertrophy at the L4-5 and L5-S1 levels. IMPRESSION: 1. Superior endplate compression deformity at T12 with mild osseous retropulsion as described. This appears stable from recent chest radiographs and chest CT of 2 months ago. 2. Chronic L3 compression deformity has mildly progressed from 2008, although demonstrates no acute features. 3. Lower lumbar spine facet disease. 4. Extra-spinal findings described on separate  examination of the abdomen and pelvis. Electronically Signed   By: Carey Bullocks M.D.   On: 07/08/2016 10:54    Procedures Procedures (including critical care time)  Medications Ordered in ED Medications  acetaminophen (TYLENOL) tablet 1,000 mg (1,000 mg Oral Given 07/08/16 0736)  morphine 4 MG/ML injection 2 mg (2 mg Intravenous  Given 07/08/16 0736)  ondansetron (ZOFRAN) injection 4 mg (4 mg Intravenous Given 07/08/16 0736)  iopamidol (ISOVUE-300) 61 % injection (100 mLs  Contrast Given 07/08/16 0819)     Initial Impression / Assessment and Plan / ED Course  I have reviewed the triage vital signs and the nursing notes.  Pertinent labs & imaging results that were available during my care of the patient were reviewed by me and considered in my medical decision making (see chart for details).     83 yo F With a chief complaint of left flank pain. Patient is on Coumadin and has a very large hematoma to her left flank. Will obtain a CT scan of the abdomen and pelvis with contrast to evaluate for possible intra-abdominal injury.  Patient does have an old L3 fracture that is again noted to have no significant change. There is question whether or not the patient's T12 fracture is old or new. She has no pain about that area where palpated. Suspected likely old. I discussed with the family that they should talk with her family physician about this and if they need to obtain further imaging.   :  I have discussed the diagnosis/risks/treatment options with the patient and family and believe the pt to be eligible for discharge home to follow-up with PCP. We also discussed returning to the ED immediately if new or worsening sx occur. We discussed the sx which are most concerning (e.g., sudden worsening pain, fever, inability to tolerate by mouth ) that necessitate immediate return. Medications administered to the patient during their visit and any new prescriptions provided to the patient are listed  below.  Medications given during this visit Medications  acetaminophen (TYLENOL) tablet 1,000 mg (1,000 mg Oral Given 07/08/16 0736)  morphine 4 MG/ML injection 2 mg (2 mg Intravenous Given 07/08/16 0736)  ondansetron (ZOFRAN) injection 4 mg (4 mg Intravenous Given 07/08/16 0736)  iopamidol (ISOVUE-300) 61 % injection (100 mLs  Contrast Given 07/08/16 0819)     The patient appears reasonably screen and/or stabilized for discharge and I doubt any other medical condition or other [facility] requiring further screening, evaluation, or treatment in the ED at this time prior to discharge.     Final Clinical Impressions(s) / ED Diagnoses   Final diagnoses:  Acute left-sided low back pain without sciatica    New Prescriptions Discharge Medication List as of 07/08/2016 10:15 AM    START taking these medications   Details  morphine (MSIR) 15 MG tablet Take 1 tablet (15 mg total) by mouth every 4 (four) hours as needed for severe pain., Starting Sat 07/08/2016, Print         Melene Plan, DO 07/08/16 1452

## 2016-07-08 NOTE — Discharge Instructions (Signed)
Take tylenol 1000mg (2 extra strength) four times a day.   Then take the pain medicine if you feel like you need it. Narcotics do not help with the pain, they only make you care about it less.  You can become addicted to this, people may break into your house to steal it.  It will constipate you.  If you drive under the influence of this medicine you can get a DUI.    You had a compression fracture that was seen at T12. The radiologist is unsure if this is an acute injury or not. She did not specifically have pain there. To see if this is acute discussed with her family physician to have a nuclear medicine study performed.

## 2016-07-08 NOTE — ED Notes (Signed)
Patient transported to CT 

## 2016-07-08 NOTE — ED Triage Notes (Signed)
[facility] after hitting home alert button for pain. Pt states the pain is in the back. Alert and oriented to person and place. EMS unable to verify baseline mental status. Vitals stable.

## 2016-07-10 ENCOUNTER — Encounter: Payer: Self-pay | Admitting: Internal Medicine

## 2016-07-10 ENCOUNTER — Telehealth: Payer: Self-pay | Admitting: Internal Medicine

## 2016-07-10 NOTE — Telephone Encounter (Signed)
See message below, please advise.

## 2016-07-10 NOTE — Telephone Encounter (Signed)
[employer] members line is calling on behalf of the pt due to the pts granddaughter who would like to have someone to come out to stay with the pt during the day and at night.  the information can be call in to the physician line at [phone]

## 2016-07-10 NOTE — Telephone Encounter (Signed)
Pts granddaughter calling to ask if Dr. Kirtland BouchardK could call in to state that it is medically necessary for someone to go out to make sure that the pt is eating, taking medication, give pt a bath (daily) and if you need more information you can talk with the nurse that goes out and talk with (Bezz,RN**[facility]).  Pt do not want to go to assistance living want to stay at home but would like to have some assistance.  Would Dr. Kirtland BouchardK pls [facility]  [phone].

## 2016-07-10 NOTE — Telephone Encounter (Signed)
Please attempt to arrange home health care.  I am unable to call physician line since it is after hours, and I will be out of the office for the next week

## 2016-07-11 ENCOUNTER — Encounter: Payer: Self-pay | Admitting: Internal Medicine

## 2016-07-11 NOTE — Telephone Encounter (Signed)
[facility]  [phone] to give verbal orders per Dr Kirtland BouchardK for home health yet I kept getting disconnected and could not speak to a person.

## 2016-07-12 ENCOUNTER — Ambulatory Visit (INDEPENDENT_AMBULATORY_CARE_PROVIDER_SITE_OTHER): Payer: Medicare Other

## 2016-07-12 ENCOUNTER — Telehealth: Payer: Self-pay

## 2016-07-12 DIAGNOSIS — E1149 Type 2 diabetes mellitus with other diabetic neurological complication: Secondary | ICD-10-CM | POA: Diagnosis not present

## 2016-07-12 DIAGNOSIS — I509 Heart failure, unspecified: Secondary | ICD-10-CM | POA: Diagnosis not present

## 2016-07-12 DIAGNOSIS — Z7901 Long term (current) use of anticoagulants: Secondary | ICD-10-CM

## 2016-07-12 DIAGNOSIS — Z5181 Encounter for therapeutic drug level monitoring: Secondary | ICD-10-CM

## 2016-07-12 DIAGNOSIS — I482 Chronic atrial fibrillation: Secondary | ICD-10-CM | POA: Diagnosis not present

## 2016-07-12 DIAGNOSIS — R269 Unspecified abnormalities of gait and mobility: Secondary | ICD-10-CM | POA: Diagnosis not present

## 2016-07-12 DIAGNOSIS — R262 Difficulty in walking, not elsewhere classified: Secondary | ICD-10-CM | POA: Diagnosis not present

## 2016-07-12 LAB — POCT INR: INR: 6.1

## 2016-07-12 NOTE — Telephone Encounter (Signed)
[facility] report of elevated INR of 6.1; this is elevated from 3.1. PT is 72.8 which is elevated from 41.9. Buzz said INR has been going up and down; [employer] RN not available.Buzz request cb this afternoon and Buzz will give Angelica ChessmanMandy pts granddaughter contact info at cb.

## 2016-07-12 NOTE — Patient Instructions (Addendum)
Pre visit review using our clinic review tool, if applicable. No additional management support is needed unless otherwise documented below in the visit note.  INR today:  6.1  Result called in today by home health nurse, Theodis BlazeBuzz Richardson, Rn.  He denies any abnormal signs and symptoms of bleeding or bruising but does report recent hospitalization on 07/08/16 after suffering a fall with subsequent left flank pain and bruising.  Only change to medication is the addition of Morphine, which she is not taking.  Granddaughter fixes pill box and monitors, along with home health nurse, that medications are being taken correctly on a daily basis.  Currently, family along with home health agency assistance, working on placement of patient in a facility as care needs have greatly increased.  Instructions given today to both Buzz, and granddaughter, Marylene Landngela, to hold coumadin 2/21, 2/22, and 2/23 and for home health to recheck a stat INR level on Friday 2/23 and call me with results for further instructions.  Also, educated on risks associated with supratherapeutic level and to go to ER if any unusual bruising or bleeding develops.

## 2016-07-12 NOTE — Telephone Encounter (Signed)
[facility] Buzz, and instructed to hold coumadin today, tomorrow and Friday and recheck, stat, INR on Friday and call me directly with results.  Direct office number given.    This Clinical research associatewriter also contacted granddaughter, Marylene Landngela, and left detailed message with instructions on cell Vm, contact info given if she has any further questions.  Buzz reports removing coumadin from patient pill box for today and tomorrow and granddaughter made aware.

## 2016-07-13 ENCOUNTER — Encounter: Payer: Self-pay | Admitting: Internal Medicine

## 2016-07-13 DIAGNOSIS — I509 Heart failure, unspecified: Secondary | ICD-10-CM | POA: Diagnosis not present

## 2016-07-13 DIAGNOSIS — R269 Unspecified abnormalities of gait and mobility: Secondary | ICD-10-CM | POA: Diagnosis not present

## 2016-07-13 DIAGNOSIS — R262 Difficulty in walking, not elsewhere classified: Secondary | ICD-10-CM | POA: Diagnosis not present

## 2016-07-13 DIAGNOSIS — I482 Chronic atrial fibrillation: Secondary | ICD-10-CM | POA: Diagnosis not present

## 2016-07-13 DIAGNOSIS — E1149 Type 2 diabetes mellitus with other diabetic neurological complication: Secondary | ICD-10-CM | POA: Diagnosis not present

## 2016-07-14 ENCOUNTER — Telehealth: Payer: Self-pay | Admitting: Internal Medicine

## 2016-07-14 ENCOUNTER — Ambulatory Visit (INDEPENDENT_AMBULATORY_CARE_PROVIDER_SITE_OTHER): Payer: Medicare Other

## 2016-07-14 ENCOUNTER — Encounter: Payer: Self-pay | Admitting: Internal Medicine

## 2016-07-14 DIAGNOSIS — I509 Heart failure, unspecified: Secondary | ICD-10-CM | POA: Diagnosis not present

## 2016-07-14 DIAGNOSIS — Z7901 Long term (current) use of anticoagulants: Secondary | ICD-10-CM

## 2016-07-14 DIAGNOSIS — R269 Unspecified abnormalities of gait and mobility: Secondary | ICD-10-CM | POA: Diagnosis not present

## 2016-07-14 DIAGNOSIS — E1149 Type 2 diabetes mellitus with other diabetic neurological complication: Secondary | ICD-10-CM | POA: Diagnosis not present

## 2016-07-14 DIAGNOSIS — I482 Chronic atrial fibrillation: Secondary | ICD-10-CM | POA: Diagnosis not present

## 2016-07-14 DIAGNOSIS — Z5181 Encounter for therapeutic drug level monitoring: Secondary | ICD-10-CM

## 2016-07-14 DIAGNOSIS — R262 Difficulty in walking, not elsewhere classified: Secondary | ICD-10-CM | POA: Diagnosis not present

## 2016-07-14 LAB — POCT INR: INR: 4.3

## 2016-07-14 NOTE — Telephone Encounter (Signed)
Daughter would like a call back to discuss pt's care. Pt's INR is up and down. [facility]. Revonda Standardllison is frustrated and concerned.  She does not know what to do. Attempted to make an appointment, pt not seen by Dr Kirtland BouchardK in over a year. Pt did see Dr Clent RidgesFry 10/2015 for an acute issue.  Daughter would like a call back asap to  Decide what to do.  Pt was at the ED on Saturday, but they declined to admit.

## 2016-07-14 NOTE — Telephone Encounter (Signed)
This is an issue that Dr. Amador CunasKwiatkowski needs to deal with next week

## 2016-07-14 NOTE — Patient Instructions (Signed)
Pre visit review using our clinic review tool, if applicable. No additional management support is needed unless otherwise documented below in the visit note. 

## 2016-07-14 NOTE — Telephone Encounter (Signed)
Spoke with pt's granddaughter, Marylene Landngela. She is very concerned about pt's care. She has been told that home health will be discontinued next week. She is unable to care for pt without assistance. Pt also needs PT-INR checked several times weekly as it has been fluctuating greatly. She would like recommendations for continued care. Scheduled pt to be seen ASAP by Dr Kirtland BouchardK and pt needs INR done.   Dr. Kirtland BouchardK - [name]. Thanks!

## 2016-07-14 NOTE — Telephone Encounter (Signed)
Emailed daughter to sent us the orders that they need signed

## 2016-07-16 ENCOUNTER — Encounter (HOSPITAL_COMMUNITY): Payer: Self-pay | Admitting: Emergency Medicine

## 2016-07-16 ENCOUNTER — Emergency Department (HOSPITAL_COMMUNITY): Payer: Medicare Other

## 2016-07-16 ENCOUNTER — Encounter: Payer: Self-pay | Admitting: Internal Medicine

## 2016-07-16 ENCOUNTER — Emergency Department (HOSPITAL_COMMUNITY)
Admission: EM | Admit: 2016-07-16 | Discharge: 2016-07-16 | Disposition: A | Discharge status: Home / Self Care | Payer: Medicare Other | Attending: Emergency Medicine | Admitting: Emergency Medicine

## 2016-07-16 DIAGNOSIS — I517 Cardiomegaly: Secondary | ICD-10-CM | POA: Diagnosis not present

## 2016-07-16 DIAGNOSIS — Y999 Unspecified external cause status: Secondary | ICD-10-CM | POA: Insufficient documentation

## 2016-07-16 DIAGNOSIS — Z955 Presence of coronary angioplasty implant and graft: Secondary | ICD-10-CM | POA: Insufficient documentation

## 2016-07-16 DIAGNOSIS — I509 Heart failure, unspecified: Secondary | ICD-10-CM | POA: Diagnosis not present

## 2016-07-16 DIAGNOSIS — Z7901 Long term (current) use of anticoagulants: Secondary | ICD-10-CM | POA: Diagnosis not present

## 2016-07-16 DIAGNOSIS — I252 Old myocardial infarction: Secondary | ICD-10-CM | POA: Insufficient documentation

## 2016-07-16 DIAGNOSIS — E039 Hypothyroidism, unspecified: Secondary | ICD-10-CM | POA: Insufficient documentation

## 2016-07-16 DIAGNOSIS — Z79899 Other long term (current) drug therapy: Secondary | ICD-10-CM | POA: Diagnosis not present

## 2016-07-16 DIAGNOSIS — Z7902 Long term (current) use of antithrombotics/antiplatelets: Secondary | ICD-10-CM | POA: Diagnosis not present

## 2016-07-16 DIAGNOSIS — Y92009 Unspecified place in unspecified non-institutional (private) residence as the place of occurrence of the external cause: Secondary | ICD-10-CM | POA: Insufficient documentation

## 2016-07-16 DIAGNOSIS — I110 Hypertensive heart disease with heart failure: Secondary | ICD-10-CM | POA: Insufficient documentation

## 2016-07-16 DIAGNOSIS — M545 Low back pain: Secondary | ICD-10-CM | POA: Diagnosis not present

## 2016-07-16 DIAGNOSIS — Z7984 Long term (current) use of oral hypoglycemic drugs: Secondary | ICD-10-CM | POA: Diagnosis not present

## 2016-07-16 DIAGNOSIS — Y939 Activity, unspecified: Secondary | ICD-10-CM | POA: Diagnosis not present

## 2016-07-16 DIAGNOSIS — G8929 Other chronic pain: Secondary | ICD-10-CM | POA: Diagnosis not present

## 2016-07-16 DIAGNOSIS — M549 Dorsalgia, unspecified: Secondary | ICD-10-CM | POA: Diagnosis not present

## 2016-07-16 DIAGNOSIS — S20222A Contusion of left back wall of thorax, initial encounter: Secondary | ICD-10-CM | POA: Insufficient documentation

## 2016-07-16 DIAGNOSIS — W19XXXA Unspecified fall, initial encounter: Secondary | ICD-10-CM | POA: Diagnosis not present

## 2016-07-16 DIAGNOSIS — M5489 Other dorsalgia: Secondary | ICD-10-CM | POA: Diagnosis not present

## 2016-07-16 DIAGNOSIS — S299XXA Unspecified injury of thorax, initial encounter: Secondary | ICD-10-CM | POA: Diagnosis not present

## 2016-07-16 DIAGNOSIS — R259 Unspecified abnormal involuntary movements: Secondary | ICD-10-CM | POA: Diagnosis not present

## 2016-07-16 DIAGNOSIS — M546 Pain in thoracic spine: Secondary | ICD-10-CM | POA: Diagnosis not present

## 2016-07-16 DIAGNOSIS — Z950 Presence of cardiac pacemaker: Secondary | ICD-10-CM | POA: Diagnosis not present

## 2016-07-16 DIAGNOSIS — E1140 Type 2 diabetes mellitus with diabetic neuropathy, unspecified: Secondary | ICD-10-CM | POA: Insufficient documentation

## 2016-07-16 DIAGNOSIS — S3992XA Unspecified injury of lower back, initial encounter: Secondary | ICD-10-CM | POA: Diagnosis present

## 2016-07-16 LAB — URINALYSIS, ROUTINE W REFLEX MICROSCOPIC
BILIRUBIN URINE: NEGATIVE
Glucose, UA: NEGATIVE mg/dL
Hgb urine dipstick: NEGATIVE
KETONES UR: NEGATIVE mg/dL
NITRITE: NEGATIVE
PROTEIN: NEGATIVE mg/dL
Specific Gravity, Urine: 1.006 (ref 1.005–1.030)
pH: 8 (ref 5.0–8.0)

## 2016-07-16 LAB — BASIC METABOLIC PANEL
Anion gap: 9 (ref 5–15)
BUN: 15 mg/dL (ref 6–20)
CHLORIDE: 104 mmol/L (ref 101–111)
CO2: 26 mmol/L (ref 22–32)
CREATININE: 1.03 mg/dL — AB (ref 0.44–1.00)
Calcium: 9 mg/dL (ref 8.9–10.3)
GFR calc Af Amer: 55 mL/min — ABNORMAL LOW (ref >60)
GFR calc non Af Amer: 47 mL/min — ABNORMAL LOW (ref >60)
GLUCOSE: 151 mg/dL — AB (ref 65–99)
POTASSIUM: 3.1 mmol/L — AB (ref 3.5–5.1)
Sodium: 139 mmol/L (ref 135–145)

## 2016-07-16 LAB — CBC WITH DIFFERENTIAL/PLATELET
Basophils Absolute: 0 10*3/uL (ref 0.0–0.1)
Basophils Relative: 0 %
EOS ABS: 0.1 10*3/uL (ref 0.0–0.7)
EOS PCT: 1 %
HCT: 30.1 % — ABNORMAL LOW (ref 36.0–46.0)
HEMOGLOBIN: 9.5 g/dL — AB (ref 12.0–15.0)
LYMPHS ABS: 1.2 10*3/uL (ref 0.7–4.0)
LYMPHS PCT: 24 %
MCH: 29.8 pg (ref 26.0–34.0)
MCHC: 31.6 g/dL (ref 30.0–36.0)
MCV: 94.4 fL (ref 78.0–100.0)
MONOS PCT: 6 %
Monocytes Absolute: 0.3 10*3/uL (ref 0.1–1.0)
Neutro Abs: 3.4 10*3/uL (ref 1.7–7.7)
Neutrophils Relative %: 69 %
PLATELETS: 158 10*3/uL (ref 150–400)
RBC: 3.19 MIL/uL — AB (ref 3.87–5.11)
RDW: 15.9 % — ABNORMAL HIGH (ref 11.5–15.5)
WBC: 5 10*3/uL (ref 4.0–10.5)

## 2016-07-16 LAB — PROTIME-INR
INR: 2.61
Prothrombin Time: 28.4 seconds — ABNORMAL HIGH (ref 11.4–15.2)

## 2016-07-16 MED ORDER — LIDOCAINE 5 % EX PTCH
1.0000 | MEDICATED_PATCH | CUTANEOUS | Status: DC
  Administered 2016-07-16: 1 via TRANSDERMAL
  Filled 2016-07-16: qty 1

## 2016-07-16 MED ORDER — POTASSIUM CHLORIDE CRYS ER 20 MEQ PO TBCR
40.0000 meq | EXTENDED_RELEASE_TABLET | Freq: Once | ORAL | Status: AC
  Administered 2016-07-16: 40 meq via ORAL
  Filled 2016-07-16: qty 2

## 2016-07-16 MED ORDER — HYDROCODONE-ACETAMINOPHEN 5-325 MG PO TABS
1.0000 | ORAL_TABLET | Freq: Once | ORAL | Status: AC
  Administered 2016-07-16: 1 via ORAL
  Filled 2016-07-16: qty 1

## 2016-07-16 NOTE — ED Provider Notes (Signed)
[facility]-EMERGENCY DEPT Provider Note   CSN: [id number] Arrival date & time: 07/16/16  4098     History   Chief Complaint Chief Complaint  Patient presents with  . Back Pain    chronic    HPI [name] is a 83 y.o. female. She presents evaluation of falls, and back pain. Patient has a history of falls. She is anticoagulated due to history of A. Fib. Takes Plavix and Coumadin. Seen recently for other falls. Has had some falls and increasing back pain over last few days. Has bruising on her head. Had his normal CT for that several days ago at [facility]. Has not struck her head since. Complains of left back pain and has some bruising in her left flank.  No abdominal pain. No vomiting. No blood in her stool or urine. Mentating normally at home. Takes Ultram for pain. Daughter/family member here is concerned because patient lives alone. Nearest closest family member lives in [place].  HPI  Past Medical History:  Diagnosis Date  . Anxiety   . Atrial fibrillation (HCC)   . Atrial fibrillation (HCC)   . CHF (congestive heart failure) (HCC)   . Constipation   . Diverticulosis of colon   . DM (diabetes mellitus) (HCC)   . Hyperlipidemia   . Hypertension   . Hypothyroidism   . Osteoporosis   . Pain in left ear   . Thyroid disease   . Tremor, essential   . Type II or unspecified type diabetes mellitus with neurological manifestations, not stated as uncontrolled(250.60)     Patient Active Problem List   Diagnosis Date Noted  . CHB (complete heart block) (HCC) post AVN ablation 04/22/2016  . PAH (pulmonary artery hypertension) 04/22/2016  . NSTEMI (non-ST elevated myocardial infarction) (HCC) 04/20/2016  . Abdominal pain   . Encounter for therapeutic drug monitoring 09/04/2013  . Long term current use of anticoagulant therapy 01/06/2013  . Renal insufficiency 09/12/2010  . Dvt femoral (deep venous thrombosis) (HCC) 09/08/2010  . Hearing loss 11/08/2009  . BACK PAIN  09/06/2009  . PACEMAKER, PERMANENT 10/13/2008  . OSTEOPOROSIS 07/05/2007  . Dyslipidemia 05/03/2007  . TREMOR, ESSENTIAL 02/26/2007  . Congestive heart failure (HCC) 01/26/2007  . Hypothyroidism 01/02/2007  . Diabetes mellitus with neuropathy (HCC) 01/02/2007  . Anxiety state 01/02/2007  . Essential hypertension 01/02/2007  . ATRIAL FIBRILLATION 01/02/2007  . DIVERTICULOSIS, COLON 01/02/2007    Past Surgical History:  Procedure Laterality Date  . CARDIAC CATHETERIZATION N/A 04/20/2016   Procedure: Left Heart Cath and Coronary Angiography;  Surgeon: Yvonne Kendall, MD;  Location: [facility] INVASIVE CV LAB;  Service: Cardiovascular;  Laterality: N/A;  . CARDIAC CATHETERIZATION N/A 04/24/2016   Procedure: Coronary Stent Intervention;  Surgeon: Lennette Bihari, MD;  Location: [facility] INVASIVE CV LAB;  Service: Cardiovascular;  Laterality: N/A;  . CARDIAC CATHETERIZATION N/A 04/24/2016   Procedure: Coronary Balloon Angioplasty;  Surgeon: Lennette Bihari, MD;  Location: [facility] INVASIVE CV LAB;  Service: Cardiovascular;  Laterality: N/A;  . CARDIAC CATHETERIZATION N/A 04/26/2016   Procedure: Coronary Stent Intervention;  Surgeon: Iran Ouch, MD;  Location: [facility] INVASIVE CV LAB;  Service: Cardiovascular;  Laterality: N/A;  . CATARACT EXTRACTION  2002  . L3 compression fraction    . PACEMAKER PLACEMENT    . TRANSTHORACIC ECHOCARDIOGRAM  2011, 2012    OB History    No data available       Home Medications    Prior to Admission medications   Medication Sig Start  Date End Date Taking? Authorizing Provider  atorvastatin (LIPITOR) 40 MG tablet Take 1 tablet (40 mg total) by mouth daily at 6 PM. Patient taking differently: Take 40 mg by mouth daily.  05/01/16  Yes Arty Baumgartner, NP  CARTIA XT 300 MG 24 hr capsule take 1 capsule by mouth once daily 03/09/16  Yes Gordy Savers, MD  clopidogrel (PLAVIX) 75 MG tablet Take 1 tablet (75 mg total) by mouth daily. 05/02/16  Yes Arty Baumgartner, NP    diazepam (VALIUM) 5 MG tablet take 1 tablet by mouth twice a day if needed for anxiety and AGITATION 06/13/16  Yes Gordy Savers, MD  ferrous sulfate 325 (65 FE) MG tablet take 1 tablet by mouth once daily 03/31/16  Yes Gordy Savers, MD  furosemide (LASIX) 40 MG tablet take 1 tablet by mouth once daily 06/12/16  Yes Gordy Savers, MD  hyoscyamine (LEVBID) 0.375 MG 12 hr tablet take 1 tablet by mouth twice a day ONLY IF NEEDED  **DO NOT TAKE EVERYDAY MUST LAST 30 DAYS** Patient taking differently: TAKE 1 TABLET BY MOUTH EVERY OTHER NIGHT - MUST LAST 30 DAYS 12/20/15  Yes Gordy Savers, MD  levalbuterol Pauline Aus) 0.63 MG/3ML nebulizer solution Take 0.63 mg by nebulization every 6 (six) hours as needed for wheezing or shortness of breath.   Yes Historical Provider, MD  levothyroxine (SYNTHROID, LEVOTHROID) 75 MCG tablet take 1 tablet by mouth once daily 03/31/16  Yes Gordy Savers, MD  metFORMIN (GLUCOPHAGE-XR) 500 MG 24 hr tablet Take 1 tablet (500 mg total) by mouth daily with breakfast. 11/26/15  Yes Gordy Savers, MD  morphine (MSIR) 15 MG tablet Take 1 tablet (15 mg total) by mouth every 4 (four) hours as needed for severe pain. 07/08/16  Yes Melene Plan, DO  potassium chloride (K-DUR,KLOR-CON) 10 MEQ tablet take 1 tablet by mouth once daily 05/30/16  Yes Gordy Savers, MD  traMADol (ULTRAM) 50 MG tablet Take 1 tablet (50 mg total) by mouth every 6 (six) hours as needed for moderate pain. Patient taking differently: Take 50 mg by mouth daily as needed for moderate pain.  10/26/15  Yes Nelwyn Salisbury, MD  warfarin (COUMADIN) 2 MG tablet Take as directed by anticoagulation clinic 06/05/16  Yes Gordy Savers, MD    Family History Family History  Problem Relation Age of Onset  . Heart attack Father   . Colon cancer Neg Hx     Social History Social History  Substance Use Topics  . Smoking status: Never Smoker  . Smokeless tobacco: Never Used  . Alcohol use  No     Allergies   Codeine sulfate and Simvastatin   Review of Systems Review of Systems  Constitutional: Negative for appetite change, chills, diaphoresis, fatigue and fever.  HENT: Negative for mouth sores, sore throat and trouble swallowing.   Eyes: Negative for visual disturbance.  Respiratory: Negative for cough, chest tightness, shortness of breath and wheezing.   Cardiovascular: Negative for chest pain.  Gastrointestinal: Negative for abdominal distention, abdominal pain, diarrhea, nausea and vomiting.  Endocrine: Negative for polydipsia, polyphagia and polyuria.  Genitourinary: Negative for dysuria, frequency and hematuria.  Musculoskeletal: Positive for back pain. Negative for gait problem.  Skin: Negative for color change, pallor and rash.  Neurological: Positive for weakness. Negative for dizziness, syncope, light-headedness and headaches.  Hematological: Does not bruise/bleed easily.  Psychiatric/Behavioral: Negative for behavioral problems and confusion.     Physical Exam Updated  Vital Signs BP 121/56 (BP Location: Right Arm)   Pulse 73   Temp 97.8 F (36.6 C) (Oral)   Resp 18   SpO2 95%   Physical Exam  Constitutional: She is oriented to person, place, and time. She appears well-developed and well-nourished. No distress.  HENT:  Head: Normocephalic.    Eyes: Conjunctivae are normal. Pupils are equal, round, and reactive to light. No scleral icterus.  Neck: Normal range of motion. Neck supple. No thyromegaly present.  Cardiovascular: Normal rate and regular rhythm.  Exam reveals no gallop and no friction rub.   No murmur heard. Pulmonary/Chest: Effort normal and breath sounds normal. No respiratory distress. She has no wheezes. She has no rales.  Abdominal: Soft. Bowel sounds are normal. She exhibits no distension. There is no tenderness. There is no rebound.  Musculoskeletal: Normal range of motion.       Back:  Neurological: She is alert and oriented  to person, place, and time.  Skin: Skin is warm and dry. No rash noted.  Psychiatric: She has a normal mood and affect. Her behavior is normal.     ED Treatments / Results  Labs (all labs ordered are listed, but only abnormal results are displayed) Labs Reviewed  CBC WITH DIFFERENTIAL/PLATELET - Abnormal; Notable for the following:       Result Value   RBC 3.19 (*)    Hemoglobin 9.5 (*)    HCT 30.1 (*)    RDW 15.9 (*)    All other components within normal limits  BASIC METABOLIC PANEL - Abnormal; Notable for the following:    Potassium 3.1 (*)    Glucose, Bld 151 (*)    Creatinine, Ser 1.03 (*)    GFR calc non Af Amer 47 (*)    GFR calc Af Amer 55 (*)    All other components within normal limits  PROTIME-INR - Abnormal; Notable for the following:    Prothrombin Time 28.4 (*)    All other components within normal limits  URINALYSIS, ROUTINE W REFLEX MICROSCOPIC - Abnormal; Notable for the following:    Leukocytes, UA SMALL (*)    Bacteria, UA RARE (*)    Squamous Epithelial / LPF 0-5 (*)    All other components within normal limits    EKG  EKG Interpretation  Date/Time:  Sunday July 16 2016 09:04:34 EST Ventricular Rate:  73 PR Interval:    QRS Duration: 150 QT Interval:  453 QTC Calculation: 500 R Axis:   -80 Text Interpretation:  Ventricular-paced rhythm No further analysis attempted due to paced rhythm Confirmed by Fayrene Fearing  MD, Adrain Butrick ([id number]) on 07/16/2016 9:21:30 AM       Radiology Dg Chest 1 View  Result Date: 07/16/2016 CLINICAL DATA:  Fall yesterday. Atrial fibrillation and congestive heart failure. Initial encounter. EXAM: CHEST 1 VIEW COMPARISON:  07/06/2016 FINDINGS: Stable moderate cardiomegaly. Aortic atherosclerosis. Dual lead transvenous pacemaker remains in appropriate position. No evidence of pulmonary infiltrate or edema. No evidence of pleural effusion. Mild hyperinflation again noted. IMPRESSION: Stable cardiomegaly.  No active lung disease.  Electronically Signed   By: Myles Rosenthal M.D.   On: 07/16/2016 10:42   Dg Thoracic Spine 2 View  Result Date: 07/16/2016 CLINICAL DATA:  Fall yesterday at home. Thoracic back pain. Initial encounter. EXAM: THORACIC SPINE 2 VIEWS COMPARISON:  Chest CT on 05/14/2016 FINDINGS: There is no evidence of acute thoracic spine fracture. Alignment is normal. No other significant bone abnormalities are identified. Old compression fracture of T12  vertebral body is again seen. Intervertebral disc spaces are maintained. No focal lytic or sclerotic bone lesions identified. IMPRESSION: No acute findings. Old T12 vertebral body compression fracture again noted. Electronically Signed   By: Myles RosenthalJohn  Stahl M.D.   On: 07/16/2016 10:49   Dg Lumbar Spine Complete  Result Date: 07/16/2016 CLINICAL DATA:  Fall at home yesterday. Low back pain. Initial encounter. EXAM: LUMBAR SPINE - COMPLETE 4+ VIEW COMPARISON:  11/22/2014 FINDINGS: There is no evidence of acute lumbar spine fracture. Alignment is normal. Old L3 vertebral body compression fracture is stable. Intervertebral disc spaces are maintained. Moderate facet DJD seen bilaterally at L4-5 and L5-S1. Aortic atherosclerosis. IMPRESSION: No evidence of acute lumbar spine fracture or subluxation.  Prograf Old L3 vertebral body compression fracture.  Lower lumbar facet DJD. Electronically Signed   By: Myles RosenthalJohn  Stahl M.D.   On: 07/16/2016 10:53    Procedures Procedures (including critical care time)  Medications Ordered in ED Medications  lidocaine (LIDODERM) 5 % 1 patch (1 patch Transdermal Patch Applied 07/16/16 1005)  HYDROcodone-acetaminophen (NORCO/VICODIN) 5-325 MG per tablet 1 tablet (1 tablet Oral Given 07/16/16 0943)  potassium chloride SA (K-DUR,KLOR-CON) CR tablet 40 mEq (40 mEq Oral Given 07/16/16 1107)     Initial Impression / Assessment and Plan / ED Course  I have reviewed the triage vital signs and the nursing notes.  Pertinent labs & imaging results that were  available during my care of the patient were reviewed by me and considered in my medical decision making (see chart for details).     Evaluation. Not overly coagulopathic. Given some by mouth Vicodin does help her pain. Evaluated with social work, and Futures tradercare manager. Patient will be transferred to accepting sniff facility. Has been excepted after failing physical therapy evaluation.  Final Clinical Impressions(s) / ED Diagnoses   Final diagnoses:  Chronic low back pain, unspecified back pain laterality, with sciatica presence unspecified    New Prescriptions New Prescriptions   No medications on file     Rolland PorterMark Jaleia Hanke, MD 07/16/16 1505

## 2016-07-16 NOTE — Care Management Note (Signed)
Case Management Note  Patient Details  Name: Ventura BrunsMaxine F Bothwell MRN: [id number] Date of Birth: 1927/04/05  Subjective/Objective:   Fall                 Action/Plan: Discharge Planning: NCM spoke to pt and grand-dtr, Maryclare LabradorAngela Schultz [phone], POA. Pt lives at home alone. Dtr states that [place] in [place] city and assist her as much as possible. She and her brother will be working on getting pt in an ALF. CSW referral for information on ALF. PT ordered to eval, waiting PT recommendations for dc. Pt has RW at home. [facility] and [facility].    PCP Gordy SaversKWIATKOWSKI, PETER F   Expected Discharge Date:                  Expected Discharge Plan:  Skilled Nursing Facility  In-House Referral:  Clinical Social Work  Discharge planning Services  CM Consult  Post Acute Care Choice:  NA Choice offered to:  NA  DME Arranged:  N/A DME Agency:  NA  HH Arranged:  NA HH Agency:  NA  Status of Service:  Completed, signed off  If discussed at Long Length of Stay Meetings, dates discussed:    Additional Comments:  Elliot CousinShavis, Ashli Selders Ellen, RN 07/16/2016, 2:14 PM

## 2016-07-16 NOTE — Progress Notes (Signed)
CSW contacted by CM, requesting ALF resources for patient. CSW spoke with patient at bedside, patient's granddaughter present. CSW provided patient's granddaughter with ALF resources. CM inquired about patient's ability to go to SNF and reported that PT would be consulted. CSW requested to be notified with PT recommendation, CM agreed. CSW asked patient and patient's granddaughter about SNF of interest, patient's granddaughter requested [name] noting that patient was previously there. CSW agreed to contact [name] to inquire about bed availability.    CSW contacted and informed by CM that PT recommended SNF.  CSW contacted [name] and staff confirmed bed availability. CSW agreed to provide required paperwork.  Patient received and accepted bed offer from [name]. Report can be called [phone] patient going to room 3206. Patient to be transported via [facility].

## 2016-07-16 NOTE — ED Notes (Signed)
Bed: [id number] Expected date:  Expected time:  Means of arrival:  Comments: 83 yo chronic back pain

## 2016-07-16 NOTE — Evaluation (Signed)
Physical Therapy Evaluation Patient Details Name: [name] MRN: [id number] DOB: 1926-09-22 Today's Date: 07/16/2016   History of Present Illness  83 yo female adm after fall at home; PMH: CHF, pacemaker, HTN, DM  Clinical Impression  Pt admitted with above diagnosis. Pt currently with functional limitations due to the deficits listed below (see PT Problem List). * Pt will benefit from skilled PT to increase their independence and safety with mobility to allow discharge to the venue listed below.   Pt presents with global deconditioning and decr balance and therefore is a significant risk for continued falls; she has been in the ED 4x in the last 6 mos and also presents with cognitive deficits as well--see below for details; Pt would likely benefit from a longer term solution such as ALF after SNF--which is the current recommendation; If pt D/C's home would recommend 24 hour care and HHPT    Follow Up Recommendations SNF;Supervision/Assistance - 24 hour    Equipment Recommendations  None recommended by PT    Recommendations for Other Services       Precautions / Restrictions Precautions Precautions: Fall      Mobility  Bed Mobility Overal bed mobility: Needs Assistance Bed Mobility: Sit to Supine     Supine to sit: Supervision Sit to supine: Supervision   General bed mobility comments: close supervision for safety; pt repeatedly tries to get back in bed prior to amb  Transfers Overall transfer level: Needs assistance Equipment used: Rolling walker (2 wheeled) Transfers: Sit to/from Stand Sit to Stand: Min assist         General transfer comment: posterior LOB initially; assist to rise/stabilize, cues for safety  Ambulation/Gait Ambulation/Gait assistance: Min assist;Mod assist Ambulation Distance (Feet): 70 Feet   Gait Pattern/deviations: Step-through pattern;Trunk flexed;Narrow base of support;Drifts right/left     General Gait Details: pt requires  assist for balance intermittently and to manuever RW/negotiate obstacles, requires incr assist d/tincr back pain after ~40'--pt grabs her back and knees flex ~30*; multi-modal cues for RW  use to support safe and remain upright  Stairs            Wheelchair Mobility    Modified Rankin (Stroke Patients Only)       Balance Overall balance assessment: Needs assistance;History of Falls Sitting-balance support: No upper extremity supported;Feet unsupported Sitting balance-Leahy Scale: Fair Sitting balance - Comments: wt shifting difficult d/t back pain   Standing balance support: No upper extremity supported;Bilateral upper extremity supported Standing balance-Leahy Scale: Poor Standing balance comment: reliant on UEs                             Pertinent Vitals/Pain Pain Assessment: Faces Faces Pain Scale: Hurts whole lot Pain Location: low back--initially denies, pain induced by movement Pain Descriptors / Indicators: Grimacing;Guarding;Pressure;Tightness Pain Intervention(s): Monitored during session;Repositioned    Home Living Family/patient expects to be discharged to:: Private residence Living Arrangements: Alone Available Help at Discharge: Personal care attendant Type of Home: House Home Access: Stairs to enter   Secretary/administrator of Steps: 4 and 4-5 more steps Home Layout: One level Home Equipment: Bedside commode Additional Comments: pt reports she has a nurse that comes 2-3 days a wk--this is not accurate per pt'd grand-daughter who returned mid eval (no family present initially)    Prior Function Level of Independence: Independent;Independent with assistive device(s)         Comments: am with RW inside, does not  use it outside per pt; supposedly independent with meal prep and household chores but grand-dtr reports she believes pt has not been eating consistently     Hand Dominance        Extremity/Trunk Assessment   Upper Extremity  Assessment Upper Extremity Assessment: Generalized weakness    Lower Extremity Assessment Lower Extremity Assessment: Generalized weakness    Cervical / Trunk Assessment Cervical / Trunk Assessment: Kyphotic  Communication   Communication: No difficulties  Cognition Arousal/Alertness: Awake/alert Behavior During Therapy: WFL for tasks assessed/performed Overall Cognitive Status: Impaired/Different from baseline Area of Impairment: Orientation;Attention;Following commands;Safety/judgement;Problem solving Orientation Level: Disoriented to;Place;Situation Current Attention Level: Sustained Memory: Decreased short-term memory Following Commands: Follows one step commands with increased time;Follows multi-step commands inconsistently Safety/Judgement: Decreased awareness of safety;Decreased awareness of deficits   Problem Solving: Slow processing;Requires verbal cues;Requires tactile cues General Comments: pt is unable to state where she is or why she is here other than ('I fell"); pt speech tangential, she is unable to attend to and participate in current conversation; she states she was on a recent family outing and talks about "what a good time" they had; however, grand-dtr reports pt was not with the family at this time;  pt has difficulty staying on task and requires frequent cues/redirection    General Comments      Exercises     Assessment/Plan    PT Assessment Patient needs continued PT services  PT Problem List Decreased strength;Decreased activity tolerance;Decreased balance;Decreased mobility;Decreased safety awareness;Decreased cognition;Pain       PT Treatment Interventions DME instruction;Gait training;Functional mobility training;Therapeutic activities;Patient/family education;Balance training;Therapeutic exercise    PT Goals (Current goals can be found in the Care Plan section)  Acute Rehab PT Goals Patient Stated Goal: to get better PT Goal Formulation: With  patient/family Time For Goal Achievement: 07/30/16 Potential to Achieve Goals: Good    Frequency Min 3X/week   Barriers to discharge        Co-evaluation               End of Session Equipment Utilized During Treatment: Gait belt Activity Tolerance: Patient limited by pain Patient left: in bed;with call bell/phone within reach;with family/visitor present Nurse Communication: Mobility status PT Visit Diagnosis: Repeated falls (R29.6)    Functional Assessment Tool Used: AM-PAC 6 Clicks Basic Mobility;Clinical judgement Functional Limitation: Mobility: Walking and moving around Mobility: Walking and Moving Around Current Status (Z6109(G8978): At least 40 percent but less than 60 percent impaired, limited or restricted Mobility: Walking and Moving Around Goal Status [phone](G8979): At least 1 percent but less than 20 percent impaired, limited or restricted    Time: 1315-1341 PT Time Calculation (min) (ACUTE ONLY): 26 min   Charges:   PT Evaluation $PT Eval Moderate Complexity: 1 Procedure PT Treatments $Gait Training: 8-22 mins   PT G Codes:   PT G-Codes **NOT FOR INPATIENT CLASS** Functional Assessment Tool Used: AM-PAC 6 Clicks Basic Mobility;Clinical judgement Functional Limitation: Mobility: Walking and moving around Mobility: Walking and Moving Around Current Status (U9811(G8978): At least 40 percent but less than 60 percent impaired, limited or restricted Mobility: Walking and Moving Around Goal Status [phone](G8979): At least 1 percent but less than 20 percent impaired, limited or restricted     [facility] SystemWILLIAMS,Ame Heagle 07/16/2016, 2:08 PM

## 2016-07-16 NOTE — ED Triage Notes (Signed)
Per [facility] pt from home , chronic pain , home meds with no relief . Hx multiple fall. Fell yesterday . No loc no injury. Ambulates with walker assistance . Alert and oriented x 4.

## 2016-07-16 NOTE — NC FL2 (Signed)
MEDICAID FL2 LEVEL OF CARE SCREENING TOOL     IDENTIFICATION  Patient Name: [name] Birthdate: 1926-09-19 Sex: female Admission Date (Current Location): 07/16/2016  [facility] [place]   Facility and Address:  [facility],  [address], [place] [id number]      Provider Number: [id number]  Attending Physician Name and Address:  Rolland PorterMark James, MD  Relative Name and Phone Number:       Current Level of Care: Hospital Recommended Level of Care: Skilled Nursing Facility Prior Approval Number:    Date Approved/Denied:   PASRR Number: [id number]  Discharge Plan: SNF    Current Diagnoses: Patient Active Problem List   Diagnosis Date Noted  . CHB (complete heart block) (HCC) post AVN ablation 04/22/2016  . PAH (pulmonary artery hypertension) 04/22/2016  . NSTEMI (non-ST elevated myocardial infarction) (HCC) 04/20/2016  . Abdominal pain   . Encounter for therapeutic drug monitoring 09/04/2013  . Nigil Braman term current use of anticoagulant therapy 01/06/2013  . Renal insufficiency 09/12/2010  . Dvt femoral (deep venous thrombosis) (HCC) 09/08/2010  . Hearing loss 11/08/2009  . BACK PAIN 09/06/2009  . PACEMAKER, PERMANENT 10/13/2008  . OSTEOPOROSIS 07/05/2007  . Dyslipidemia 05/03/2007  . TREMOR, ESSENTIAL 02/26/2007  . Congestive heart failure (HCC) 01/26/2007  . Hypothyroidism 01/02/2007  . Diabetes mellitus with neuropathy (HCC) 01/02/2007  . Anxiety state 01/02/2007  . Essential hypertension 01/02/2007  . ATRIAL FIBRILLATION 01/02/2007  . DIVERTICULOSIS, COLON 01/02/2007    Orientation RESPIRATION BLADDER Height & Weight     Self, Situation  Normal Continent Weight:   Height:     BEHAVIORAL SYMPTOMS/MOOD NEUROLOGICAL BOWEL NUTRITION STATUS      Continent Diet (low sodium)  AMBULATORY STATUS COMMUNICATION OF NEEDS Skin   Extensive Assist Verbally Normal                       Personal Care Assistance Level  of Assistance  Bathing, Feeding, Dressing Bathing Assistance: Maximum assistance Feeding assistance: Limited assistance Dressing Assistance: Maximum assistance     Functional Limitations Info  Sight, Hearing, Speech Sight Info: Adequate Hearing Info: Impaired Speech Info: Adequate    SPECIAL CARE FACTORS FREQUENCY                       Contractures Contractures Info: Not present    Additional Factors Info  Allergies, Code Status Code Status Info: Full code Allergies Info: Codeine; Sulfate; Simvastatin;           Current Medications (07/16/2016):  This is the current hospital active medication list Current Facility-Administered Medications  Medication Dose Route Frequency Provider Last Rate Last Dose  . lidocaine (LIDODERM) 5 % 1 patch  1 patch Transdermal Q24H Rolland PorterMark James, MD   1 patch at 07/16/16 1005   Current Outpatient Prescriptions  Medication Sig Dispense Refill  . atorvastatin (LIPITOR) 40 MG tablet Take 1 tablet (40 mg total) by mouth daily at 6 PM. (Patient taking differently: Take 40 mg by mouth daily. ) 30 tablet 6  . CARTIA XT 300 MG 24 hr capsule take 1 capsule by mouth once daily 90 capsule 1  . clopidogrel (PLAVIX) 75 MG tablet Take 1 tablet (75 mg total) by mouth daily. 30 tablet 6  . diazepam (VALIUM) 5 MG tablet take 1 tablet by mouth twice a day if needed for anxiety and AGITATION 30 tablet 0  . ferrous sulfate 325 (65 FE) MG tablet  take 1 tablet by mouth once daily 30 tablet 5  . furosemide (LASIX) 40 MG tablet take 1 tablet by mouth once daily 90 tablet 0  . hyoscyamine (LEVBID) 0.375 MG 12 hr tablet take 1 tablet by mouth twice a day ONLY IF NEEDED  **DO NOT TAKE EVERYDAY MUST LAST 30 DAYS** (Patient taking differently: TAKE 1 TABLET BY MOUTH EVERY OTHER NIGHT - MUST LAST 30 DAYS) 30 tablet 1  . levalbuterol (XOPENEX) 0.63 MG/3ML nebulizer solution Take 0.63 mg by nebulization every 6 (six) hours as needed for wheezing or shortness of breath.    .  levothyroxine (SYNTHROID, LEVOTHROID) 75 MCG tablet take 1 tablet by mouth once daily 90 tablet 2  . metFORMIN (GLUCOPHAGE-XR) 500 MG 24 hr tablet Take 1 tablet (500 mg total) by mouth daily with breakfast. 90 tablet 3  . morphine (MSIR) 15 MG tablet Take 1 tablet (15 mg total) by mouth every 4 (four) hours as needed for severe pain. 7 tablet 0  . potassium chloride (K-DUR,KLOR-CON) 10 MEQ tablet take 1 tablet by mouth once daily 30 tablet 5  . traMADol (ULTRAM) 50 MG tablet Take 1 tablet (50 mg total) by mouth every 6 (six) hours as needed for moderate pain. (Patient taking differently: Take 50 mg by mouth daily as needed for moderate pain. ) 60 tablet 0  . warfarin (COUMADIN) 2 MG tablet Take as directed by anticoagulation clinic 105 tablet 1     Discharge Medications: Please see current list of medications.  Relevant Imaging Results:  Relevant Lab Results:   Additional Information SSN: [id number]  Maria Poles, LCSW

## 2016-07-17 ENCOUNTER — Telehealth (HOSPITAL_COMMUNITY): Payer: Self-pay | Admitting: Internal Medicine

## 2016-07-17 ENCOUNTER — Telehealth: Payer: Self-pay

## 2016-07-17 ENCOUNTER — Encounter (HOSPITAL_COMMUNITY): Payer: Self-pay | Admitting: Internal Medicine

## 2016-07-17 DIAGNOSIS — I5032 Chronic diastolic (congestive) heart failure: Secondary | ICD-10-CM | POA: Diagnosis not present

## 2016-07-17 DIAGNOSIS — W19XXXD Unspecified fall, subsequent encounter: Secondary | ICD-10-CM | POA: Diagnosis not present

## 2016-07-17 DIAGNOSIS — M545 Low back pain: Secondary | ICD-10-CM | POA: Diagnosis not present

## 2016-07-17 DIAGNOSIS — I2510 Atherosclerotic heart disease of native coronary artery without angina pectoris: Secondary | ICD-10-CM | POA: Diagnosis not present

## 2016-07-17 NOTE — Telephone Encounter (Signed)
This Clinical research associatewriter notes that patient was in ER yesterday due to severe back pain resulting from recent fall.  I contacted granddaughter, Marylene Landngela, to check on patient status.    Patient currently has been admitted to a rehab facility and will be following the facility doctor and care team during admission.  They will be taking over coumadin in the interim and I did pass along my direct contact information should they need specifics or have any questions regarding her current or most recent results or dosing instructions.  Will forward this communication on to PCP and Bailey Mechindy Boyd, RN (coumadin nurse).

## 2016-07-17 NOTE — Telephone Encounter (Signed)
Mailed letter with Cardiac Rehab Program along with my chart msg.. KJ  °

## 2016-07-18 NOTE — Telephone Encounter (Signed)
Spoke with patient's granddaughter, Marylene Landngela, as I see notes where she was in the ER over the weekend.  Marylene Landngela confirms and also that patient has been transferred to a nursing facility for rehab care at the present.    Physician at the facility will take over care in the interim and monitor the coumadin.

## 2016-07-18 NOTE — Telephone Encounter (Signed)
Review coag visit encounter for details around call report of INR on (07/14/16).

## 2016-07-19 ENCOUNTER — Encounter: Payer: Self-pay | Admitting: Internal Medicine

## 2016-07-19 ENCOUNTER — Ambulatory Visit (INDEPENDENT_AMBULATORY_CARE_PROVIDER_SITE_OTHER): Payer: Medicare Other | Admitting: Internal Medicine

## 2016-07-19 VITALS — BP 124/80 | HR 86 | Temp 97.6°F | Ht 68.0 in

## 2016-07-19 DIAGNOSIS — Z7901 Long term (current) use of anticoagulants: Secondary | ICD-10-CM

## 2016-07-19 DIAGNOSIS — I214 Non-ST elevation (NSTEMI) myocardial infarction: Secondary | ICD-10-CM

## 2016-07-19 DIAGNOSIS — F411 Generalized anxiety disorder: Secondary | ICD-10-CM

## 2016-07-19 DIAGNOSIS — I482 Chronic atrial fibrillation: Secondary | ICD-10-CM | POA: Diagnosis not present

## 2016-07-19 DIAGNOSIS — I10 Essential (primary) hypertension: Secondary | ICD-10-CM

## 2016-07-19 DIAGNOSIS — I5022 Chronic systolic (congestive) heart failure: Secondary | ICD-10-CM | POA: Diagnosis not present

## 2016-07-19 DIAGNOSIS — I4821 Permanent atrial fibrillation: Secondary | ICD-10-CM

## 2016-07-19 NOTE — Progress Notes (Signed)
Subjective:    Patient ID: [name], female    DOB: 12-Dec-1926, 83 y.o.   MRN: [id number]  HPI  83 year old patient who is seen today for follow-up. Medical records reviewed.  Patient has not been seen by me in approximately one year Patient was hospitalized for NSTEMI in December and was noted have severe two-vessel coronary artery disease.  She is status post PCI.  She has left ventricular dysfunction with an estimated ejection fraction of 40%.  She has a permanent pacemaker and history complete heart block and prior AVN ablation.  She has chronic atrial fibrillation and remains on chronic Coumadin anticoagulation. She has had a recent ED visit 3 days ago and lab.  An INR was checked at that time. She has been referred to [name] for physical therapy, which she presently enjoys and receiving some benefit.  She has been living independently and also driving.  Over the past several months.  She has been quite weak and unsteady with frequent falls and ED visits She is accompanied by her granddaughter today She is scheduled for cardiology follow-up next month  Past Medical History:  Diagnosis Date  . Anxiety   . Atrial fibrillation (HCC)   . Atrial fibrillation (HCC)   . CHF (congestive heart failure) (HCC)   . Constipation   . Diverticulosis of colon   . DM (diabetes mellitus) (HCC)   . Hyperlipidemia   . Hypertension   . Hypothyroidism   . Osteoporosis   . Pain in left ear   . Thyroid disease   . Tremor, essential   . Type II or unspecified type diabetes mellitus with neurological manifestations, not stated as uncontrolled(250.60)      Social History   Social History  . Marital status: Widowed    Spouse name: N/A  . Number of children: N/A  . Years of education: N/A   Occupational History  . Not on file.   Social History Main Topics  . Smoking status: Never Smoker  . Smokeless tobacco: Never Used  . Alcohol use No  . Drug use: No  . Sexual activity:  Not on file   Other Topics Concern  . Not on file   Social History Narrative  . No narrative on file    Past Surgical History:  Procedure Laterality Date  . CARDIAC CATHETERIZATION N/A 04/20/2016   Procedure: Left Heart Cath and Coronary Angiography;  Surgeon: Yvonne Kendall, MD;  [facility] INVASIVE CV LAB;  Service: Cardiovascular;  Laterality: N/A;  . CARDIAC CATHETERIZATION N/A 04/24/2016   Procedure: Coronary Stent Intervention;  Surgeon: Lennette Bihari, MD;  Location: [facility] INVASIVE CV LAB;  Service: Cardiovascular;  Laterality: N/A;  . CARDIAC CATHETERIZATION N/A 04/24/2016   Procedure: Coronary Balloon Angioplasty;  Surgeon: Lennette Bihari, MD;  Location: [facility] INVASIVE CV LAB;  Service: Cardiovascular;  Laterality: N/A;  . CARDIAC CATHETERIZATION N/A 04/26/2016   Procedure: Coronary Stent Intervention;  Surgeon: Iran Ouch, MD;  Location: [facility] INVASIVE CV LAB;  Service: Cardiovascular;  Laterality: N/A;  . CATARACT EXTRACTION  2002  . L3 compression fraction    . PACEMAKER PLACEMENT    . TRANSTHORACIC ECHOCARDIOGRAM  2011, 2012    Family History  Problem Relation Age of Onset  . Heart attack Father   . Colon cancer Neg Hx     Allergies  Allergen Reactions  . Codeine Sulfate Shortness Of Breath and Other (See Comments)    Felt funny  . Simvastatin Other (See Comments)  choking, acid reflux    Current Outpatient Prescriptions on File Prior to Visit  Medication Sig Dispense Refill  . atorvastatin (LIPITOR) 40 MG tablet Take 1 tablet (40 mg total) by mouth daily at 6 PM. (Patient taking differently: Take 40 mg by mouth daily. ) 30 tablet 6  . CARTIA XT 300 MG 24 hr capsule take 1 capsule by mouth once daily 90 capsule 1  . clopidogrel (PLAVIX) 75 MG tablet Take 1 tablet (75 mg total) by mouth daily. 30 tablet 6  . diazepam (VALIUM) 5 MG tablet take 1 tablet by mouth twice a day if needed for anxiety and AGITATION 30 tablet 0  . ferrous sulfate 325 (65 FE) MG tablet  take 1 tablet by mouth once daily 30 tablet 5  . furosemide (LASIX) 40 MG tablet take 1 tablet by mouth once daily 90 tablet 0  . hyoscyamine (LEVBID) 0.375 MG 12 hr tablet take 1 tablet by mouth twice a day ONLY IF NEEDED  **DO NOT TAKE EVERYDAY MUST LAST 30 DAYS** (Patient taking differently: TAKE 1 TABLET BY MOUTH EVERY OTHER NIGHT - MUST LAST 30 DAYS) 30 tablet 1  . levalbuterol (XOPENEX) 0.63 MG/3ML nebulizer solution Take 0.63 mg by nebulization every 6 (six) hours as needed for wheezing or shortness of breath.    . levothyroxine (SYNTHROID, LEVOTHROID) 75 MCG tablet take 1 tablet by mouth once daily 90 tablet 2  . metFORMIN (GLUCOPHAGE-XR) 500 MG 24 hr tablet Take 1 tablet (500 mg total) by mouth daily with breakfast. 90 tablet 3  . morphine (MSIR) 15 MG tablet Take 1 tablet (15 mg total) by mouth every 4 (four) hours as needed for severe pain. 7 tablet 0  . potassium chloride (K-DUR,KLOR-CON) 10 MEQ tablet take 1 tablet by mouth once daily 30 tablet 5  . traMADol (ULTRAM) 50 MG tablet Take 1 tablet (50 mg total) by mouth every 6 (six) hours as needed for moderate pain. (Patient taking differently: Take 50 mg by mouth daily as needed for moderate pain. ) 60 tablet 0  . warfarin (COUMADIN) 2 MG tablet Take as directed by anticoagulation clinic 105 tablet 1   No current facility-administered medications on file prior to visit.     BP 124/80 (BP Location: Left Arm, Patient Position: Sitting, Cuff Size: Normal)   Pulse 86   Temp 97.6 F (36.4 C) (Oral)   Ht 5\' 8"  (1.727 m)   SpO2 95%     Review of Systems  Constitutional: Positive for activity change and fatigue.  HENT: Negative for congestion, dental problem, hearing loss, rhinorrhea, sinus pressure, sore throat and tinnitus.   Eyes: Negative for pain, discharge and visual disturbance.  Respiratory: Negative for cough and shortness of breath.   Cardiovascular: Positive for chest pain and leg swelling. Negative for palpitations.    Gastrointestinal: Negative for abdominal distention, abdominal pain, blood in stool, constipation, diarrhea, nausea and vomiting.  Genitourinary: Negative for difficulty urinating, dysuria, flank pain, frequency, hematuria, pelvic pain, urgency, vaginal bleeding, vaginal discharge and vaginal pain.  Musculoskeletal: Negative for arthralgias, gait problem and joint swelling.  Skin: Positive for color change. Negative for rash.  Neurological: Negative for dizziness, syncope, speech difficulty, weakness, numbness and headaches.  Hematological: Negative for adenopathy.  Psychiatric/Behavioral: Positive for confusion. Negative for agitation, behavioral problems and dysphoric mood. The patient is nervous/anxious.        Objective:   Physical Exam  Constitutional: She is oriented to person, place, and time. She appears well-developed and  well-nourished.  Anxious  Mildly confusedBlood pressure 124/80 O2 saturation 95%  HENT:  Head: Normocephalic.  Right Ear: External ear normal.  Left Ear: External ear normal.  Mouth/Throat: Oropharynx is clear and moist.  Eyes: Conjunctivae and EOM are normal. Pupils are equal, round, and reactive to light.  Neck: Normal range of motion. Neck supple. No thyromegaly present.  Cardiovascular: Normal rate, regular rhythm, normal heart sounds and intact distal pulses.   Pulmonary/Chest: Effort normal and breath sounds normal. She has no rales.  Abdominal: Soft. Bowel sounds are normal. She exhibits no mass. There is no tenderness.  Musculoskeletal: Normal range of motion. She exhibits edema.  Lymphadenopathy:    She has no cervical adenopathy.  Neurological: She is alert and oriented to person, place, and time.  Skin: Skin is warm and dry. No rash noted.  Hematoma left parietal scalp area Resolving extensive ecchymoses left hip region  Psychiatric: She has a normal mood and affect. Her behavior is normal.          Assessment & Plan:   Permanent  atrial fibrillation.  Continue Coumadin anticoagulation Chronic anticoagulation.  INR was therapeutic.  A few days ago.  Will recheck in one week at [name].  Cardiology follow-up as scheduled Coronary artery disease Congestive heart failure, stable Gait instability with frequent falls General debility.  Patient presently a [name] for physical therapy.  It is doubtful patient will be able to return to independent living.  This was discussed at length  Dyslipidemia.  Continue statin therapyAnxiety disorder Type 2 diabetes  Follow-up 3 months Cardiology follow-up as scheduled  Rogelia BogaKWIATKOWSKI,Rondle Lohse FRANK

## 2016-07-19 NOTE — Patient Instructions (Signed)
Cardiology follow-up as scheduled  Limit your sodium (Salt) intake  Continue physical therapy at [name]  Call or return to clinic prn if these symptoms worsen or fail to improve as anticipated.

## 2016-07-19 NOTE — Progress Notes (Signed)
Pre visit review using our clinic review tool, if applicable. No additional management support is needed unless otherwise documented below in the visit note. 

## 2016-07-20 DIAGNOSIS — E782 Mixed hyperlipidemia: Secondary | ICD-10-CM | POA: Diagnosis not present

## 2016-07-20 DIAGNOSIS — I509 Heart failure, unspecified: Secondary | ICD-10-CM | POA: Diagnosis not present

## 2016-07-20 DIAGNOSIS — I481 Persistent atrial fibrillation: Secondary | ICD-10-CM | POA: Diagnosis not present

## 2016-07-20 DIAGNOSIS — E039 Hypothyroidism, unspecified: Secondary | ICD-10-CM | POA: Diagnosis not present

## 2016-07-20 DIAGNOSIS — M810 Age-related osteoporosis without current pathological fracture: Secondary | ICD-10-CM | POA: Diagnosis not present

## 2016-07-20 DIAGNOSIS — R2689 Other abnormalities of gait and mobility: Secondary | ICD-10-CM | POA: Diagnosis not present

## 2016-07-20 DIAGNOSIS — E1140 Type 2 diabetes mellitus with diabetic neuropathy, unspecified: Secondary | ICD-10-CM | POA: Diagnosis not present

## 2016-07-20 DIAGNOSIS — Z950 Presence of cardiac pacemaker: Secondary | ICD-10-CM | POA: Diagnosis not present

## 2016-07-20 DIAGNOSIS — I10 Essential (primary) hypertension: Secondary | ICD-10-CM | POA: Diagnosis not present

## 2016-07-20 DIAGNOSIS — R278 Other lack of coordination: Secondary | ICD-10-CM | POA: Diagnosis not present

## 2016-07-20 DIAGNOSIS — I214 Non-ST elevation (NSTEMI) myocardial infarction: Secondary | ICD-10-CM | POA: Diagnosis not present

## 2016-07-20 DIAGNOSIS — E119 Type 2 diabetes mellitus without complications: Secondary | ICD-10-CM | POA: Diagnosis not present

## 2016-07-20 DIAGNOSIS — I5032 Chronic diastolic (congestive) heart failure: Secondary | ICD-10-CM | POA: Diagnosis not present

## 2016-07-20 DIAGNOSIS — I482 Chronic atrial fibrillation: Secondary | ICD-10-CM | POA: Diagnosis not present

## 2016-07-20 DIAGNOSIS — I2510 Atherosclerotic heart disease of native coronary artery without angina pectoris: Secondary | ICD-10-CM | POA: Diagnosis not present

## 2016-07-20 DIAGNOSIS — I4891 Unspecified atrial fibrillation: Secondary | ICD-10-CM | POA: Diagnosis not present

## 2016-07-20 DIAGNOSIS — I252 Old myocardial infarction: Secondary | ICD-10-CM | POA: Diagnosis not present

## 2016-07-22 NOTE — Progress Notes (Addendum)
Cardiology Office Note   Date:  07/24/2016   ID:  [name], DOB 1926-12-29, MRN [id number]  PCP:  Rogelia BogaKWIATKOWSKI,PETER FRANK, MD    CC: f/u CAD   Wt Readings from Last 3 Encounters:  07/06/16 145 lb (65.8 kg)  06/11/16 145 lb (65.8 kg)  05/09/16 161 lb 8 oz (73.3 kg)       History of Present Illness: [name] is a 83 y.o. female  With a h/o persistent AF s/p AV node ablation, s/p PPM, chronic diastolic HF Who had a NSTEMI in 11/17.  She had 2 vessel CAD. She was declined CABG due to her frailty.  Echo showed: LV cavity size was normal. Systolic function was mildly to moderately reduced. The estimated ejection fraction was in the range of 40% to 45%. Akinesis of the inferolateral myocardium; consistent with infarction in the distribution of the left circumflex coronary artery.  She had PCI of the circ: Successful complex PCI to a 99% bifurcation stenosis in the proximal dominant left circumflex vessel with 99% stenosis in the OM1 vessel with 70% mid AV groove stenosis after the second marginal vessel, treated with PTCA of the obtuse marginal 1 vessel with a 99% stenosis being reduced to less than 5%, and ultimate tandem stenting of the proximal to mid AV groove circumflex with insertion of tandem 3.5 x 18 mm Resolute Onyx DES stents postdilated to 3.99 mm in the proximal stent and 3.80 mm in the second stent with the entire AV groove stenosis being reduced to 0% and resumption of brisk TIMI-3 flow.  This was followed by PCI of the LAD.  After the MI, she went home and was doing great.  She was getting home health. She fell and now is in rehab.  She has fallen a few times in the past few months.  She gets confused between lipitor and tramadol.  She is tolerating clopidogrel.  No bleeding issues.  COntinuing to take warfarin as well for AFib.      Past Medical History:  Diagnosis Date  . Anxiety   . Atrial fibrillation (HCC)   . Atrial fibrillation (HCC)    . CHF (congestive heart failure) (HCC)   . Constipation   . Diverticulosis of colon   . DM (diabetes mellitus) (HCC)   . Hyperlipidemia   . Hypertension   . Hypothyroidism   . Osteoporosis   . Pain in left ear   . Thyroid disease   . Tremor, essential   . Type II or unspecified type diabetes mellitus with neurological manifestations, not stated as uncontrolled(250.60)     Past Surgical History:  Procedure Laterality Date  . CARDIAC CATHETERIZATION N/A 04/20/2016   Procedure: Left Heart Cath and Coronary Angiography;  Surgeon: Yvonne Kendallhristopher End, MD;  [facility];  Service: Cardiovascular;  Laterality: N/A;  . CARDIAC CATHETERIZATION N/A 04/24/2016   Procedure: Coronary Stent Intervention;  Surgeon: Lennette Biharihomas A Kelly, MD;  Location: [facility] CV LAB;  Service: Cardiovascular;  Laterality: N/A;  . CARDIAC CATHETERIZATION N/A 04/24/2016   Procedure: Coronary Balloon Angioplasty;  Surgeon: Lennette Biharihomas A Kelly, MD;  Location: [facility] INVASIVE CV LAB;  Service: Cardiovascular;  Laterality: N/A;  . CARDIAC CATHETERIZATION N/A 04/26/2016   Procedure: Coronary Stent Intervention;  Surgeon: Iran OuchMuhammad A Arida, MD;  Location: [facility] INVASIVE CV LAB;  Service: Cardiovascular;  Laterality: N/A;  . CATARACT EXTRACTION  2002  . L3 compression fraction    . PACEMAKER PLACEMENT    . TRANSTHORACIC  ECHOCARDIOGRAM  2011, 2012     Current Outpatient Prescriptions  Medication Sig Dispense Refill  . atorvastatin (LIPITOR) 40 MG tablet Take 1 tablet (40 mg total) by mouth daily at 6 PM. (Patient taking differently: Take 40 mg by mouth daily. ) 30 tablet 6  . CARTIA XT 300 MG 24 hr capsule take 1 capsule by mouth once daily 90 capsule 1  . clopidogrel (PLAVIX) 75 MG tablet Take 1 tablet (75 mg total) by mouth daily. 30 tablet 6  . diazepam (VALIUM) 5 MG tablet take 1 tablet by mouth twice a day if needed for anxiety and AGITATION 30 tablet 0  . ferrous sulfate 325 (65 FE) MG tablet take 1 tablet by mouth  once daily 30 tablet 5  . furosemide (LASIX) 40 MG tablet take 1 tablet by mouth once daily 90 tablet 0  . hyoscyamine (LEVBID) 0.375 MG 12 hr tablet take 1 tablet by mouth twice a day ONLY IF NEEDED  **DO NOT TAKE EVERYDAY MUST LAST 30 DAYS** (Patient taking differently: TAKE 1 TABLET BY MOUTH EVERY OTHER NIGHT - MUST LAST 30 DAYS) 30 tablet 1  . levalbuterol (XOPENEX) 0.63 MG/3ML nebulizer solution Take 0.63 mg by nebulization every 6 (six) hours as needed for wheezing or shortness of breath.    . levothyroxine (SYNTHROID, LEVOTHROID) 75 MCG tablet take 1 tablet by mouth once daily 90 tablet 2  . metFORMIN (GLUCOPHAGE-XR) 500 MG 24 hr tablet Take 1 tablet (500 mg total) by mouth daily with breakfast. 90 tablet 3  . morphine (MSIR) 15 MG tablet Take 1 tablet (15 mg total) by mouth every 4 (four) hours as needed for severe pain. 7 tablet 0  . potassium chloride (K-DUR,KLOR-CON) 10 MEQ tablet take 1 tablet by mouth once daily 30 tablet 5  . traMADol (ULTRAM) 50 MG tablet Take 1 tablet (50 mg total) by mouth every 6 (six) hours as needed for moderate pain. (Patient taking differently: Take 50 mg by mouth daily as needed for moderate pain. ) 60 tablet 0  . warfarin (COUMADIN) 2 MG tablet Take as directed by anticoagulation clinic 105 tablet 1   No current facility-administered medications for this visit.     Allergies:   Codeine sulfate and Simvastatin    Social History:  The patient  reports that she has never smoked. She has never used smokeless tobacco. She reports that she does not drink alcohol or use drugs.   Family History:  The patient's family history includes Heart attack in her father.    ROS:  Please see the history of present illness.   Otherwise, review of systems are positive for falls.   All other systems are reviewed and negative.    PHYSICAL EXAM: VS:  BP 100/60 (BP Location: Right Arm, Patient Position: Sitting, Cuff Size: Normal)   Pulse 82   Ht 5\' 8"  (1.727 m)   SpO2  95%  , BMI There is no height or weight on file to calculate BMI. GEN: Well nourished, well developed, in no acute distress  HEENT: normal  Neck: no JVD, carotid bruits, or masses Cardiac: irregular rhythm, regular rate; no murmurs, rubs, or gallops,no edema  Respiratory:  clear to auscultation bilaterally, normal work of breathing GI: soft, nontender, nondistended, + BS MS: no deformity or atrophy  Skin: warm and dry, no rash Neuro:  Strength and sensation are intact Psych: euthymic mood, full affect    Recent Labs: 04/20/2016: B Natriuretic Peptide 376.4; Magnesium 1.7; TSH 2.742 07/08/2016:  ALT 14 07/16/2016: BUN 15; Creatinine, Ser 1.03; Hemoglobin 9.5; Platelets 158; Potassium 3.1; Sodium 139   Lipid Panel    Component Value Date/Time   CHOL 176 04/21/2016 0019   TRIG 91 04/21/2016 0019   TRIG 306 (HH) 04/04/2006 1156   HDL 37 (L) 04/21/2016 0019   CHOLHDL 4.8 04/21/2016 0019   VLDL 18 04/21/2016 0019   LDLCALC 121 (H) 04/21/2016 0019   LDLDIRECT 123.0 11/11/2010 1126     Other studies Reviewed: Additional studies/ records that were reviewed today with results demonstrating: Cath results reviewed.   ASSESSMENT AND PLAN:  1. CAD : s/p PCI of the circ and LAD.  Continue clopidogrel.  No ASA due to warfarin use.  No nitroglycerin use. No angina. She is limited by general frailty. Avoid falling. This is her biggest priority. 2. Old MI: No CHF. She appears euvolemic. 3. AFib: Rate controlled.  Tolerating Coumadin.  Continue with regular INR checks. She will need a lipid check when her INR is checked again. 4. Hyperlipidemia: Continue lipid-lowering therapy. LDL target will be 70. 5. Pacemaker: Follow-up with Dr. Ladona Ridgel next month.   Current medicines are reviewed at length with the patient today.  The patient concerns regarding her medicines were addressed.  The following changes have been made:  No change  Labs/ tests ordered today include:  No orders of the defined  types were placed in this encounter.   Recommend 150 minutes/week of aerobic exercise Low fat, low carb, high fiber diet recommended  Disposition:   FU in 6 months   Signed, Lance Muss, MD  07/24/2016 11:03 AM    [facility] [address] [place], [place], [place]  [address] Phone: [phone]; Fax: [phone]

## 2016-07-24 ENCOUNTER — Ambulatory Visit (INDEPENDENT_AMBULATORY_CARE_PROVIDER_SITE_OTHER): Payer: Medicare Other | Admitting: Interventional Cardiology

## 2016-07-24 ENCOUNTER — Encounter: Payer: Self-pay | Admitting: Interventional Cardiology

## 2016-07-24 VITALS — BP 100/60 | HR 82 | Ht 68.0 in

## 2016-07-24 DIAGNOSIS — I4821 Permanent atrial fibrillation: Secondary | ICD-10-CM

## 2016-07-24 DIAGNOSIS — I482 Chronic atrial fibrillation: Secondary | ICD-10-CM | POA: Diagnosis not present

## 2016-07-24 DIAGNOSIS — Z950 Presence of cardiac pacemaker: Secondary | ICD-10-CM | POA: Diagnosis not present

## 2016-07-24 DIAGNOSIS — I2510 Atherosclerotic heart disease of native coronary artery without angina pectoris: Secondary | ICD-10-CM | POA: Diagnosis not present

## 2016-07-24 DIAGNOSIS — I252 Old myocardial infarction: Secondary | ICD-10-CM | POA: Diagnosis not present

## 2016-07-24 DIAGNOSIS — E782 Mixed hyperlipidemia: Secondary | ICD-10-CM

## 2016-07-24 NOTE — Patient Instructions (Signed)
Medication Instructions:  Same-no changes  Labwork: None  Testing/Procedures: None  Follow-Up: Your physician wants you to follow-up in: 6 months. You will receive a reminder letter in the mail two months in advance. If you don't receive a letter, please call our office to schedule the follow-up appointment.      If you need a refill on your cardiac medications before your next appointment, please call your pharmacy.   

## 2016-07-26 DIAGNOSIS — I481 Persistent atrial fibrillation: Secondary | ICD-10-CM | POA: Diagnosis not present

## 2016-07-26 DIAGNOSIS — I5032 Chronic diastolic (congestive) heart failure: Secondary | ICD-10-CM | POA: Diagnosis not present

## 2016-07-26 DIAGNOSIS — I214 Non-ST elevation (NSTEMI) myocardial infarction: Secondary | ICD-10-CM | POA: Diagnosis not present

## 2016-07-26 DIAGNOSIS — I2510 Atherosclerotic heart disease of native coronary artery without angina pectoris: Secondary | ICD-10-CM | POA: Diagnosis not present

## 2016-07-31 DIAGNOSIS — I5032 Chronic diastolic (congestive) heart failure: Secondary | ICD-10-CM | POA: Diagnosis not present

## 2016-07-31 DIAGNOSIS — E119 Type 2 diabetes mellitus without complications: Secondary | ICD-10-CM | POA: Diagnosis not present

## 2016-07-31 DIAGNOSIS — I481 Persistent atrial fibrillation: Secondary | ICD-10-CM | POA: Diagnosis not present

## 2016-07-31 DIAGNOSIS — I2510 Atherosclerotic heart disease of native coronary artery without angina pectoris: Secondary | ICD-10-CM | POA: Diagnosis not present

## 2016-08-07 DIAGNOSIS — I5032 Chronic diastolic (congestive) heart failure: Secondary | ICD-10-CM | POA: Diagnosis not present

## 2016-08-07 DIAGNOSIS — E119 Type 2 diabetes mellitus without complications: Secondary | ICD-10-CM | POA: Diagnosis not present

## 2016-08-07 DIAGNOSIS — I481 Persistent atrial fibrillation: Secondary | ICD-10-CM | POA: Diagnosis not present

## 2016-08-07 DIAGNOSIS — I2510 Atherosclerotic heart disease of native coronary artery without angina pectoris: Secondary | ICD-10-CM | POA: Diagnosis not present

## 2016-08-10 ENCOUNTER — Other Ambulatory Visit: Payer: Self-pay | Admitting: *Deleted

## 2016-08-10 NOTE — Patient Outreach (Signed)
[facility]) Care Management  08/10/2016  BERDELL NEVITT [birth year] [id number]   Met with Lowella Fairy, SW at facility. She states patient will remain at facility LTC versus going to ALF.   No [facility] community care management needs.  Plan to sign off. Royetta Crochet. Laymond Purser, RN, BSN, [place] [phone]) Business Cell  [phone]) Toll Free Office

## 2016-08-21 ENCOUNTER — Ambulatory Visit: Payer: Self-pay | Admitting: General Practice

## 2016-08-22 DIAGNOSIS — R109 Unspecified abdominal pain: Secondary | ICD-10-CM | POA: Diagnosis not present

## 2016-08-23 DIAGNOSIS — R112 Nausea with vomiting, unspecified: Secondary | ICD-10-CM | POA: Diagnosis not present

## 2016-08-23 DIAGNOSIS — R109 Unspecified abdominal pain: Secondary | ICD-10-CM | POA: Diagnosis not present

## 2016-08-23 DIAGNOSIS — K567 Ileus, unspecified: Secondary | ICD-10-CM | POA: Diagnosis not present

## 2016-08-30 ENCOUNTER — Encounter: Payer: Medicare Other | Admitting: Internal Medicine

## 2016-09-01 DIAGNOSIS — I270 Primary pulmonary hypertension: Secondary | ICD-10-CM | POA: Diagnosis not present

## 2016-09-01 DIAGNOSIS — I4891 Unspecified atrial fibrillation: Secondary | ICD-10-CM | POA: Diagnosis not present

## 2016-09-01 DIAGNOSIS — E119 Type 2 diabetes mellitus without complications: Secondary | ICD-10-CM | POA: Diagnosis not present

## 2016-09-01 DIAGNOSIS — I10 Essential (primary) hypertension: Secondary | ICD-10-CM | POA: Diagnosis not present

## 2016-09-01 DIAGNOSIS — K589 Irritable bowel syndrome without diarrhea: Secondary | ICD-10-CM | POA: Diagnosis not present

## 2016-09-14 DIAGNOSIS — I481 Persistent atrial fibrillation: Secondary | ICD-10-CM | POA: Diagnosis not present

## 2016-09-14 DIAGNOSIS — K5900 Constipation, unspecified: Secondary | ICD-10-CM | POA: Diagnosis not present

## 2016-09-14 DIAGNOSIS — I5032 Chronic diastolic (congestive) heart failure: Secondary | ICD-10-CM | POA: Diagnosis not present

## 2016-09-14 DIAGNOSIS — I2510 Atherosclerotic heart disease of native coronary artery without angina pectoris: Secondary | ICD-10-CM | POA: Diagnosis not present

## 2016-09-18 DIAGNOSIS — R0600 Dyspnea, unspecified: Secondary | ICD-10-CM | POA: Diagnosis not present

## 2016-09-18 DIAGNOSIS — I481 Persistent atrial fibrillation: Secondary | ICD-10-CM | POA: Diagnosis not present

## 2016-09-18 DIAGNOSIS — I5032 Chronic diastolic (congestive) heart failure: Secondary | ICD-10-CM | POA: Diagnosis not present

## 2016-09-18 DIAGNOSIS — I2510 Atherosclerotic heart disease of native coronary artery without angina pectoris: Secondary | ICD-10-CM | POA: Diagnosis not present

## 2016-09-19 ENCOUNTER — Encounter: Payer: Self-pay | Admitting: Internal Medicine

## 2016-09-19 ENCOUNTER — Ambulatory Visit (INDEPENDENT_AMBULATORY_CARE_PROVIDER_SITE_OTHER): Payer: Medicare Other | Admitting: Internal Medicine

## 2016-09-19 VITALS — BP 126/62 | HR 85 | Ht 63.5 in | Wt 143.0 lb

## 2016-09-19 DIAGNOSIS — I482 Chronic atrial fibrillation: Secondary | ICD-10-CM

## 2016-09-19 DIAGNOSIS — I214 Non-ST elevation (NSTEMI) myocardial infarction: Secondary | ICD-10-CM | POA: Diagnosis not present

## 2016-09-19 DIAGNOSIS — I442 Atrioventricular block, complete: Secondary | ICD-10-CM

## 2016-09-19 DIAGNOSIS — I4821 Permanent atrial fibrillation: Secondary | ICD-10-CM

## 2016-09-19 NOTE — Progress Notes (Signed)
HPI Mrs. Ibbotson returns today for followup. She is a very pleasant 83 year old woman with chronic atrial fibrillation, status post permanent pacemaker insertion, status post AV nodal ablation, with a history of diastolic heart failure. In the interim, she has been stable but has been bothered by problems with her family. She has been hospitalized with acute coronary syndrome and underwent PCI about 4 months ago. She denies recurrent anginal symptoms. Allergies  Allergen Reactions  . Codeine Sulfate Shortness Of Breath and Other (See Comments)    Felt funny  . Simvastatin Other (See Comments)     choking, acid reflux     Current Outpatient Prescriptions  Medication Sig Dispense Refill  . atorvastatin (LIPITOR) 40 MG tablet Take 1 tablet (40 mg total) by mouth daily at 6 PM. (Patient taking differently: Take 40 mg by mouth daily. ) 30 tablet 6  . CARTIA XT 300 MG 24 hr capsule take 1 capsule by mouth once daily 90 capsule 1  . clopidogrel (PLAVIX) 75 MG tablet Take 1 tablet (75 mg total) by mouth daily. 30 tablet 6  . diazepam (VALIUM) 5 MG tablet take 1 tablet by mouth twice a day if needed for anxiety and AGITATION 30 tablet 0  . ferrous sulfate 325 (65 FE) MG tablet take 1 tablet by mouth once daily 30 tablet 5  . furosemide (LASIX) 40 MG tablet take 1 tablet by mouth once daily 90 tablet 0  . hyoscyamine (LEVBID) 0.375 MG 12 hr tablet take 1 tablet by mouth twice a day ONLY IF NEEDED  **DO NOT TAKE EVERYDAY MUST LAST 30 DAYS** (Patient taking differently: TAKE 1 TABLET BY MOUTH EVERY OTHER NIGHT - MUST LAST 30 DAYS) 30 tablet 1  . levalbuterol (XOPENEX) 0.63 MG/3ML nebulizer solution Take 0.63 mg by nebulization every 6 (six) hours as needed for wheezing or shortness of breath.    . levothyroxine (SYNTHROID, LEVOTHROID) 75 MCG tablet take 1 tablet by mouth once daily 90 tablet 2  . metFORMIN (GLUCOPHAGE-XR) 500 MG 24 hr tablet Take 1 tablet (500 mg total) by mouth daily with breakfast. 90  tablet 3  . morphine (MSIR) 15 MG tablet Take 1 tablet (15 mg total) by mouth every 4 (four) hours as needed for severe pain. 7 tablet 0  . Polyethylene Glycol 3350 (MIRALAX PO) Take 17 g by mouth daily as needed (constiaption).    . potassium chloride (K-DUR,KLOR-CON) 10 MEQ tablet take 1 tablet by mouth once daily 30 tablet 5  . traMADol (ULTRAM) 50 MG tablet Take 1 tablet (50 mg total) by mouth every 6 (six) hours as needed for moderate pain. (Patient taking differently: Take 50 mg by mouth daily as needed for moderate pain. ) 60 tablet 0  . warfarin (COUMADIN) 2 MG tablet Take as directed by anticoagulation clinic 105 tablet 1   No current facility-administered medications for this visit.      Past Medical History:  Diagnosis Date  . Anxiety   . Atrial fibrillation (HCC)   . Atrial fibrillation (HCC)   . CHF (congestive heart failure) (HCC)   . Constipation   . Diverticulosis of colon   . DM (diabetes mellitus) (HCC)   . Hyperlipidemia   . Hypertension   . Hypothyroidism   . Osteoporosis   . Pain in left ear   . Thyroid disease   . Tremor, essential   . Type II or unspecified type diabetes mellitus with neurological manifestations, not stated as uncontrolled(250.60)     ROS:  All systems reviewed and negative except as noted in the HPI.   Past Surgical History:  Procedure Laterality Date  . CARDIAC CATHETERIZATION N/A 04/20/2016   Procedure: Left Heart Cath and Coronary Angiography;  Surgeon: Yvonne Kendall, MD;  [facility] CV LAB;  Service: Cardiovascular;  Laterality: N/A;  . CARDIAC CATHETERIZATION N/A 04/24/2016   Procedure: Coronary Stent Intervention;  Surgeon: Lennette Bihari, MD;  [facility]: [facility] LAB;  Service: Cardiovascular;  Laterality: N/A;  . CARDIAC CATHETERIZATION N/A 04/24/2016   Procedure: Coronary Balloon Angioplasty;  Surgeon: Lennette Bihari, MD;  Location: [facility] CV LAB;  Service: Cardiovascular;  Laterality: N/A;  . CARDIAC  CATHETERIZATION N/A 04/26/2016   Procedure: Coronary Stent Intervention;  Surgeon: Iran Ouch, MD;  Location: [facility] CV LAB;  Service: Cardiovascular;  Laterality: N/A;  . CATARACT EXTRACTION  2002  . L3 compression fraction    . PACEMAKER PLACEMENT    . TRANSTHORACIC ECHOCARDIOGRAM  2011, 2012     Family History  Problem Relation Age of Onset  . Heart attack Father   . Colon cancer Neg Hx      Social History   Social History  . Marital status: Widowed    Spouse name: N/A  . Number of children: N/A  . Years of education: N/A   Occupational History  . Not on file.   Social History Main Topics  . Smoking status: Never Smoker  . Smokeless tobacco: Never Used  . Alcohol use No  . Drug use: No  . Sexual activity: Not on file   Other Topics Concern  . Not on file   Social History Narrative  . No narrative on file     BP 126/62 (BP Location: Left Arm)   Pulse 85   Ht 5' 3.5" (1.613 m)   Wt 143 lb (64.9 kg)   BMI 24.93 kg/m   Physical Exam:  Well appearing elderly woman,NAD HEENT: Unremarkable Neck:  7 cm JVD, no thyromegally Back:  No CVA tenderness, mild kyphosis Lungs:  Clear with no wheezes, rales, or rhonchi. HEART:  Regular rate rhythm, no murmurs, no rubs, no clicks Abd:  soft, positive bowel sounds, no organomegally, no rebound, no guarding Ext:  2 plus pulses, no edema, no cyanosis, no clubbing Skin:  No rashes no nodules Neuro:  CN II through XII intact, motor grossly intact   DEVICE  Normal device function.  See PaceArt for details.   Assess/Plan: 1. Atrial fib with a controlled VR - she is asymptomatic. She will continue her warfarin 2. HTN - her blood pressure is well controlled. Will follow. 3. Diastolic heart failure - her symptoms are class 2. She is encouraged to continue her low sodium diet.  4. PPM - her medtronic device is working normally. Will recheck in several months.  5. CAD - her symptoms are well controlled and she  will continue her current meds.  [name].D.

## 2016-09-19 NOTE — Patient Instructions (Signed)
Medication Instructions:  Your physician recommends that you continue on your current medications as directed. Please refer to the Current Medication list given to you today.   Labwork: none  Testing/Procedures: none  Follow-Up: Remote monitoring is used to monitor your Pacemaker of ICD from home. This monitoring reduces the number of office visits required to check your device to one time per year. It allows [place] to keep an eye on the functioning of your device to ensure it is working properly. You are scheduled for a device check from home on 12/19/16. You may send your transmission at any time that day. If you have a wireless device, the transmission will be sent automatically. After your physician reviews your transmission, you will receive a postcard with your next transmission date.  Your physician wants you to follow-up in: 12 months with Dr. Ladona Ridgel.  You will receive a reminder letter in the mail two months in advance. If you don't receive a letter, please call our office to schedule the follow-up appointment.   Any Other Special Instructions Will Be Listed Below (If Applicable).     If you need a refill on your cardiac medications before your next appointment, please call your pharmacy.

## 2016-09-26 LAB — CUP PACEART INCLINIC DEVICE CHECK
Battery Remaining Longevity: 41 mo
Battery Voltage: 2.76 V
Date Time Interrogation Session: 20180502011346
Implantable Lead Implant Date: 20031208
Implantable Lead Location: 753859
Implantable Pulse Generator Implant Date: 20120104
Lead Channel Pacing Threshold Amplitude: 0.625 V
Lead Channel Pacing Threshold Pulse Width: 0.4 ms
Lead Channel Setting Pacing Pulse Width: 0.4 ms
Lead Channel Setting Sensing Sensitivity: 1.4 mV
MDC IDC LEAD IMPLANT DT: 20031208
MDC IDC LEAD LOCATION: 753860
MDC IDC MSMT BATTERY IMPEDANCE: 1462 Ohm
MDC IDC MSMT LEADCHNL RA IMPEDANCE VALUE: 67 Ohm
MDC IDC MSMT LEADCHNL RV IMPEDANCE VALUE: 606 Ohm
MDC IDC MSMT LEADCHNL RV PACING THRESHOLD AMPLITUDE: 0.75 V
MDC IDC MSMT LEADCHNL RV PACING THRESHOLD PULSEWIDTH: 0.4 ms
MDC IDC SET LEADCHNL RV PACING AMPLITUDE: 2.5 V
MDC IDC STAT BRADY RV PERCENT PACED: 98 %

## 2016-09-29 DIAGNOSIS — I270 Primary pulmonary hypertension: Secondary | ICD-10-CM | POA: Diagnosis not present

## 2016-09-29 DIAGNOSIS — I739 Peripheral vascular disease, unspecified: Secondary | ICD-10-CM | POA: Diagnosis not present

## 2016-09-29 DIAGNOSIS — E119 Type 2 diabetes mellitus without complications: Secondary | ICD-10-CM | POA: Diagnosis not present

## 2016-09-29 DIAGNOSIS — I4891 Unspecified atrial fibrillation: Secondary | ICD-10-CM | POA: Diagnosis not present

## 2016-10-02 DIAGNOSIS — I2510 Atherosclerotic heart disease of native coronary artery without angina pectoris: Secondary | ICD-10-CM | POA: Diagnosis not present

## 2016-10-02 DIAGNOSIS — E119 Type 2 diabetes mellitus without complications: Secondary | ICD-10-CM | POA: Diagnosis not present

## 2016-10-02 DIAGNOSIS — I481 Persistent atrial fibrillation: Secondary | ICD-10-CM | POA: Diagnosis not present

## 2016-10-02 DIAGNOSIS — I5032 Chronic diastolic (congestive) heart failure: Secondary | ICD-10-CM | POA: Diagnosis not present

## 2016-10-19 DIAGNOSIS — M545 Low back pain: Secondary | ICD-10-CM | POA: Diagnosis not present

## 2016-10-19 DIAGNOSIS — M546 Pain in thoracic spine: Secondary | ICD-10-CM | POA: Diagnosis not present

## 2016-10-19 DIAGNOSIS — I5032 Chronic diastolic (congestive) heart failure: Secondary | ICD-10-CM | POA: Diagnosis not present

## 2016-10-19 DIAGNOSIS — I2510 Atherosclerotic heart disease of native coronary artery without angina pectoris: Secondary | ICD-10-CM | POA: Diagnosis not present

## 2016-10-19 DIAGNOSIS — I10 Essential (primary) hypertension: Secondary | ICD-10-CM | POA: Diagnosis not present

## 2016-10-24 DIAGNOSIS — M545 Low back pain: Secondary | ICD-10-CM | POA: Diagnosis not present

## 2016-10-24 DIAGNOSIS — I5032 Chronic diastolic (congestive) heart failure: Secondary | ICD-10-CM | POA: Diagnosis not present

## 2016-10-24 DIAGNOSIS — I481 Persistent atrial fibrillation: Secondary | ICD-10-CM | POA: Diagnosis not present

## 2016-10-25 DIAGNOSIS — Z79899 Other long term (current) drug therapy: Secondary | ICD-10-CM | POA: Diagnosis not present

## 2016-10-25 DIAGNOSIS — R319 Hematuria, unspecified: Secondary | ICD-10-CM | POA: Diagnosis not present

## 2016-10-25 DIAGNOSIS — N390 Urinary tract infection, site not specified: Secondary | ICD-10-CM | POA: Diagnosis not present

## 2016-11-01 DIAGNOSIS — I10 Essential (primary) hypertension: Secondary | ICD-10-CM | POA: Diagnosis not present

## 2016-11-01 DIAGNOSIS — D649 Anemia, unspecified: Secondary | ICD-10-CM | POA: Diagnosis not present

## 2016-11-01 DIAGNOSIS — E785 Hyperlipidemia, unspecified: Secondary | ICD-10-CM | POA: Diagnosis not present

## 2016-11-01 DIAGNOSIS — Z79899 Other long term (current) drug therapy: Secondary | ICD-10-CM | POA: Diagnosis not present

## 2016-11-01 DIAGNOSIS — E119 Type 2 diabetes mellitus without complications: Secondary | ICD-10-CM | POA: Diagnosis not present

## 2016-11-09 DIAGNOSIS — H6123 Impacted cerumen, bilateral: Secondary | ICD-10-CM | POA: Diagnosis not present

## 2016-11-09 DIAGNOSIS — I5032 Chronic diastolic (congestive) heart failure: Secondary | ICD-10-CM | POA: Diagnosis not present

## 2016-11-09 DIAGNOSIS — M545 Low back pain: Secondary | ICD-10-CM | POA: Diagnosis not present

## 2016-11-10 DIAGNOSIS — I10 Essential (primary) hypertension: Secondary | ICD-10-CM | POA: Diagnosis not present

## 2016-11-10 DIAGNOSIS — I270 Primary pulmonary hypertension: Secondary | ICD-10-CM | POA: Diagnosis not present

## 2016-11-10 DIAGNOSIS — I509 Heart failure, unspecified: Secondary | ICD-10-CM | POA: Diagnosis not present

## 2016-11-10 DIAGNOSIS — I4891 Unspecified atrial fibrillation: Secondary | ICD-10-CM | POA: Diagnosis not present

## 2016-11-13 DIAGNOSIS — I5032 Chronic diastolic (congestive) heart failure: Secondary | ICD-10-CM | POA: Diagnosis not present

## 2016-11-13 DIAGNOSIS — I2510 Atherosclerotic heart disease of native coronary artery without angina pectoris: Secondary | ICD-10-CM | POA: Diagnosis not present

## 2016-11-13 DIAGNOSIS — H6123 Impacted cerumen, bilateral: Secondary | ICD-10-CM | POA: Diagnosis not present

## 2016-11-13 DIAGNOSIS — I10 Essential (primary) hypertension: Secondary | ICD-10-CM | POA: Diagnosis not present

## 2016-12-15 DIAGNOSIS — I4891 Unspecified atrial fibrillation: Secondary | ICD-10-CM | POA: Diagnosis not present

## 2016-12-15 DIAGNOSIS — E119 Type 2 diabetes mellitus without complications: Secondary | ICD-10-CM | POA: Diagnosis not present

## 2016-12-15 DIAGNOSIS — Z950 Presence of cardiac pacemaker: Secondary | ICD-10-CM | POA: Diagnosis not present

## 2016-12-15 DIAGNOSIS — I509 Heart failure, unspecified: Secondary | ICD-10-CM | POA: Diagnosis not present

## 2016-12-15 DIAGNOSIS — M25552 Pain in left hip: Secondary | ICD-10-CM | POA: Diagnosis not present

## 2016-12-19 ENCOUNTER — Encounter: Payer: Medicare Other | Admitting: *Deleted

## 2016-12-22 ENCOUNTER — Encounter: Payer: Self-pay | Admitting: Cardiology

## 2016-12-28 DIAGNOSIS — I2510 Atherosclerotic heart disease of native coronary artery without angina pectoris: Secondary | ICD-10-CM | POA: Diagnosis not present

## 2016-12-28 DIAGNOSIS — I5032 Chronic diastolic (congestive) heart failure: Secondary | ICD-10-CM | POA: Diagnosis not present

## 2016-12-28 DIAGNOSIS — I481 Persistent atrial fibrillation: Secondary | ICD-10-CM | POA: Diagnosis not present

## 2016-12-28 DIAGNOSIS — S7002XD Contusion of left hip, subsequent encounter: Secondary | ICD-10-CM | POA: Diagnosis not present

## 2017-01-05 DIAGNOSIS — E119 Type 2 diabetes mellitus without complications: Secondary | ICD-10-CM | POA: Diagnosis not present

## 2017-01-05 DIAGNOSIS — I4891 Unspecified atrial fibrillation: Secondary | ICD-10-CM | POA: Diagnosis not present

## 2017-01-05 DIAGNOSIS — I10 Essential (primary) hypertension: Secondary | ICD-10-CM | POA: Diagnosis not present

## 2017-01-05 DIAGNOSIS — I2510 Atherosclerotic heart disease of native coronary artery without angina pectoris: Secondary | ICD-10-CM | POA: Diagnosis not present

## 2017-01-23 DIAGNOSIS — E119 Type 2 diabetes mellitus without complications: Secondary | ICD-10-CM | POA: Diagnosis not present

## 2017-01-25 DIAGNOSIS — E1140 Type 2 diabetes mellitus with diabetic neuropathy, unspecified: Secondary | ICD-10-CM | POA: Diagnosis not present

## 2017-01-25 DIAGNOSIS — E039 Hypothyroidism, unspecified: Secondary | ICD-10-CM | POA: Diagnosis not present

## 2017-01-25 DIAGNOSIS — I10 Essential (primary) hypertension: Secondary | ICD-10-CM | POA: Diagnosis not present

## 2017-01-25 DIAGNOSIS — R278 Other lack of coordination: Secondary | ICD-10-CM | POA: Diagnosis not present

## 2017-01-25 DIAGNOSIS — Z950 Presence of cardiac pacemaker: Secondary | ICD-10-CM | POA: Diagnosis not present

## 2017-01-25 DIAGNOSIS — I4891 Unspecified atrial fibrillation: Secondary | ICD-10-CM | POA: Diagnosis not present

## 2017-01-25 DIAGNOSIS — R2689 Other abnormalities of gait and mobility: Secondary | ICD-10-CM | POA: Diagnosis not present

## 2017-01-25 DIAGNOSIS — M810 Age-related osteoporosis without current pathological fracture: Secondary | ICD-10-CM | POA: Diagnosis not present

## 2017-01-25 DIAGNOSIS — Z5181 Encounter for therapeutic drug level monitoring: Secondary | ICD-10-CM | POA: Diagnosis not present

## 2017-02-01 DIAGNOSIS — Z950 Presence of cardiac pacemaker: Secondary | ICD-10-CM | POA: Diagnosis not present

## 2017-02-01 DIAGNOSIS — R278 Other lack of coordination: Secondary | ICD-10-CM | POA: Diagnosis not present

## 2017-02-01 DIAGNOSIS — I4891 Unspecified atrial fibrillation: Secondary | ICD-10-CM | POA: Diagnosis not present

## 2017-02-01 DIAGNOSIS — I10 Essential (primary) hypertension: Secondary | ICD-10-CM | POA: Diagnosis not present

## 2017-02-01 DIAGNOSIS — M810 Age-related osteoporosis without current pathological fracture: Secondary | ICD-10-CM | POA: Diagnosis not present

## 2017-02-01 DIAGNOSIS — E1140 Type 2 diabetes mellitus with diabetic neuropathy, unspecified: Secondary | ICD-10-CM | POA: Diagnosis not present

## 2017-02-01 DIAGNOSIS — E039 Hypothyroidism, unspecified: Secondary | ICD-10-CM | POA: Diagnosis not present

## 2017-02-01 DIAGNOSIS — Z5181 Encounter for therapeutic drug level monitoring: Secondary | ICD-10-CM | POA: Diagnosis not present

## 2017-02-01 DIAGNOSIS — R2689 Other abnormalities of gait and mobility: Secondary | ICD-10-CM | POA: Diagnosis not present

## 2017-02-08 ENCOUNTER — Encounter: Payer: Self-pay | Admitting: Internal Medicine

## 2017-02-08 DIAGNOSIS — R278 Other lack of coordination: Secondary | ICD-10-CM | POA: Diagnosis not present

## 2017-02-08 DIAGNOSIS — I10 Essential (primary) hypertension: Secondary | ICD-10-CM | POA: Diagnosis not present

## 2017-02-08 DIAGNOSIS — Z5181 Encounter for therapeutic drug level monitoring: Secondary | ICD-10-CM | POA: Diagnosis not present

## 2017-02-08 DIAGNOSIS — Z950 Presence of cardiac pacemaker: Secondary | ICD-10-CM | POA: Diagnosis not present

## 2017-02-08 DIAGNOSIS — E039 Hypothyroidism, unspecified: Secondary | ICD-10-CM | POA: Diagnosis not present

## 2017-02-08 DIAGNOSIS — M810 Age-related osteoporosis without current pathological fracture: Secondary | ICD-10-CM | POA: Diagnosis not present

## 2017-02-08 DIAGNOSIS — I4891 Unspecified atrial fibrillation: Secondary | ICD-10-CM | POA: Diagnosis not present

## 2017-02-08 DIAGNOSIS — R2689 Other abnormalities of gait and mobility: Secondary | ICD-10-CM | POA: Diagnosis not present

## 2017-02-08 DIAGNOSIS — E1140 Type 2 diabetes mellitus with diabetic neuropathy, unspecified: Secondary | ICD-10-CM | POA: Diagnosis not present

## 2017-02-15 DIAGNOSIS — Z950 Presence of cardiac pacemaker: Secondary | ICD-10-CM | POA: Diagnosis not present

## 2017-02-15 DIAGNOSIS — E039 Hypothyroidism, unspecified: Secondary | ICD-10-CM | POA: Diagnosis not present

## 2017-02-15 DIAGNOSIS — I4891 Unspecified atrial fibrillation: Secondary | ICD-10-CM | POA: Diagnosis not present

## 2017-02-15 DIAGNOSIS — R2689 Other abnormalities of gait and mobility: Secondary | ICD-10-CM | POA: Diagnosis not present

## 2017-02-15 DIAGNOSIS — E1140 Type 2 diabetes mellitus with diabetic neuropathy, unspecified: Secondary | ICD-10-CM | POA: Diagnosis not present

## 2017-02-15 DIAGNOSIS — Z5181 Encounter for therapeutic drug level monitoring: Secondary | ICD-10-CM | POA: Diagnosis not present

## 2017-02-15 DIAGNOSIS — M810 Age-related osteoporosis without current pathological fracture: Secondary | ICD-10-CM | POA: Diagnosis not present

## 2017-02-15 DIAGNOSIS — R278 Other lack of coordination: Secondary | ICD-10-CM | POA: Diagnosis not present

## 2017-02-15 DIAGNOSIS — I10 Essential (primary) hypertension: Secondary | ICD-10-CM | POA: Diagnosis not present

## 2017-02-16 DIAGNOSIS — I10 Essential (primary) hypertension: Secondary | ICD-10-CM | POA: Diagnosis not present

## 2017-02-16 DIAGNOSIS — I509 Heart failure, unspecified: Secondary | ICD-10-CM | POA: Diagnosis not present

## 2017-02-16 DIAGNOSIS — E119 Type 2 diabetes mellitus without complications: Secondary | ICD-10-CM | POA: Diagnosis not present

## 2017-03-09 DIAGNOSIS — R600 Localized edema: Secondary | ICD-10-CM | POA: Diagnosis not present

## 2017-03-09 DIAGNOSIS — I2510 Atherosclerotic heart disease of native coronary artery without angina pectoris: Secondary | ICD-10-CM | POA: Diagnosis not present

## 2017-03-09 DIAGNOSIS — I509 Heart failure, unspecified: Secondary | ICD-10-CM | POA: Diagnosis not present

## 2017-03-13 IMAGING — CR DG HAND COMPLETE 3+V*R*
3 series · 3 of 3 positions shown · non-contrast
Comparison: Prior radiograph from 05/15/2015.

CLINICAL DATA: Initial evaluation for acute trauma, fall, pain.

EXAM:
RIGHT HAND - COMPLETE 3+ VIEW

[hand pa]
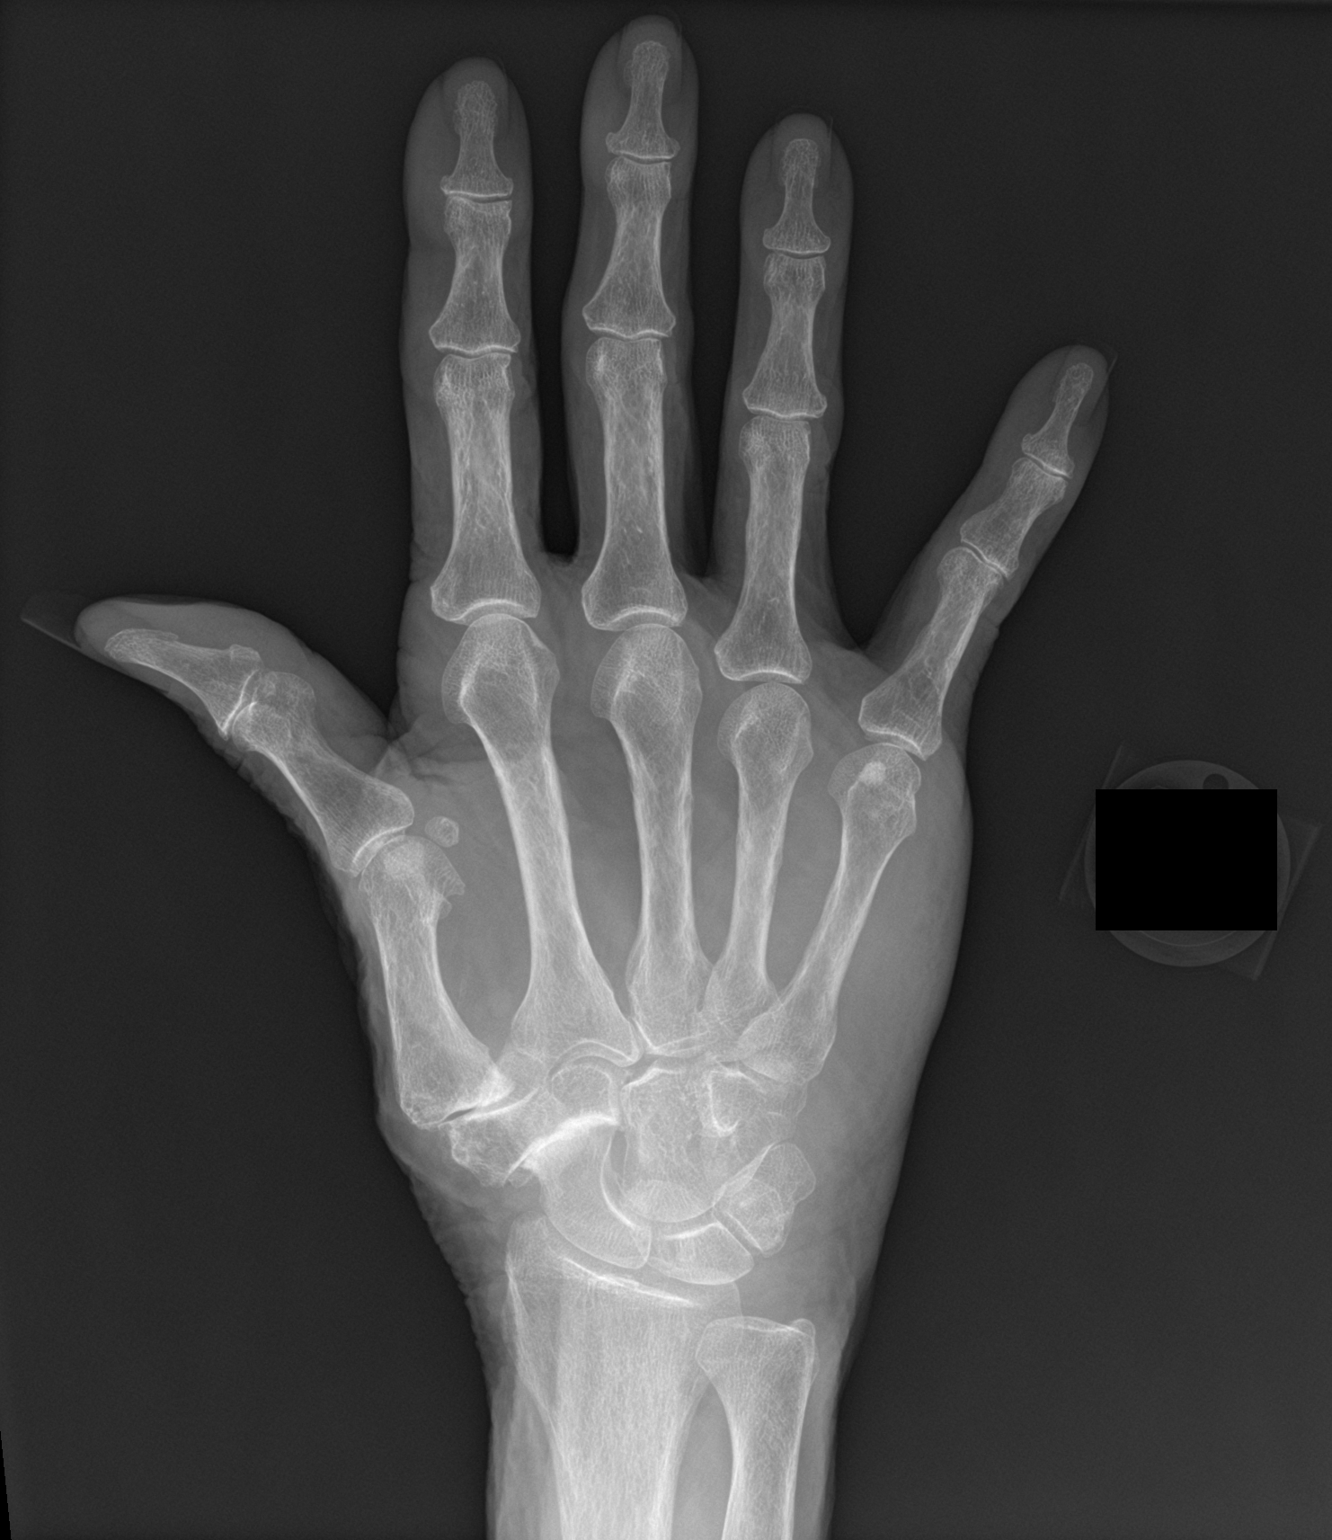

[hand obl]
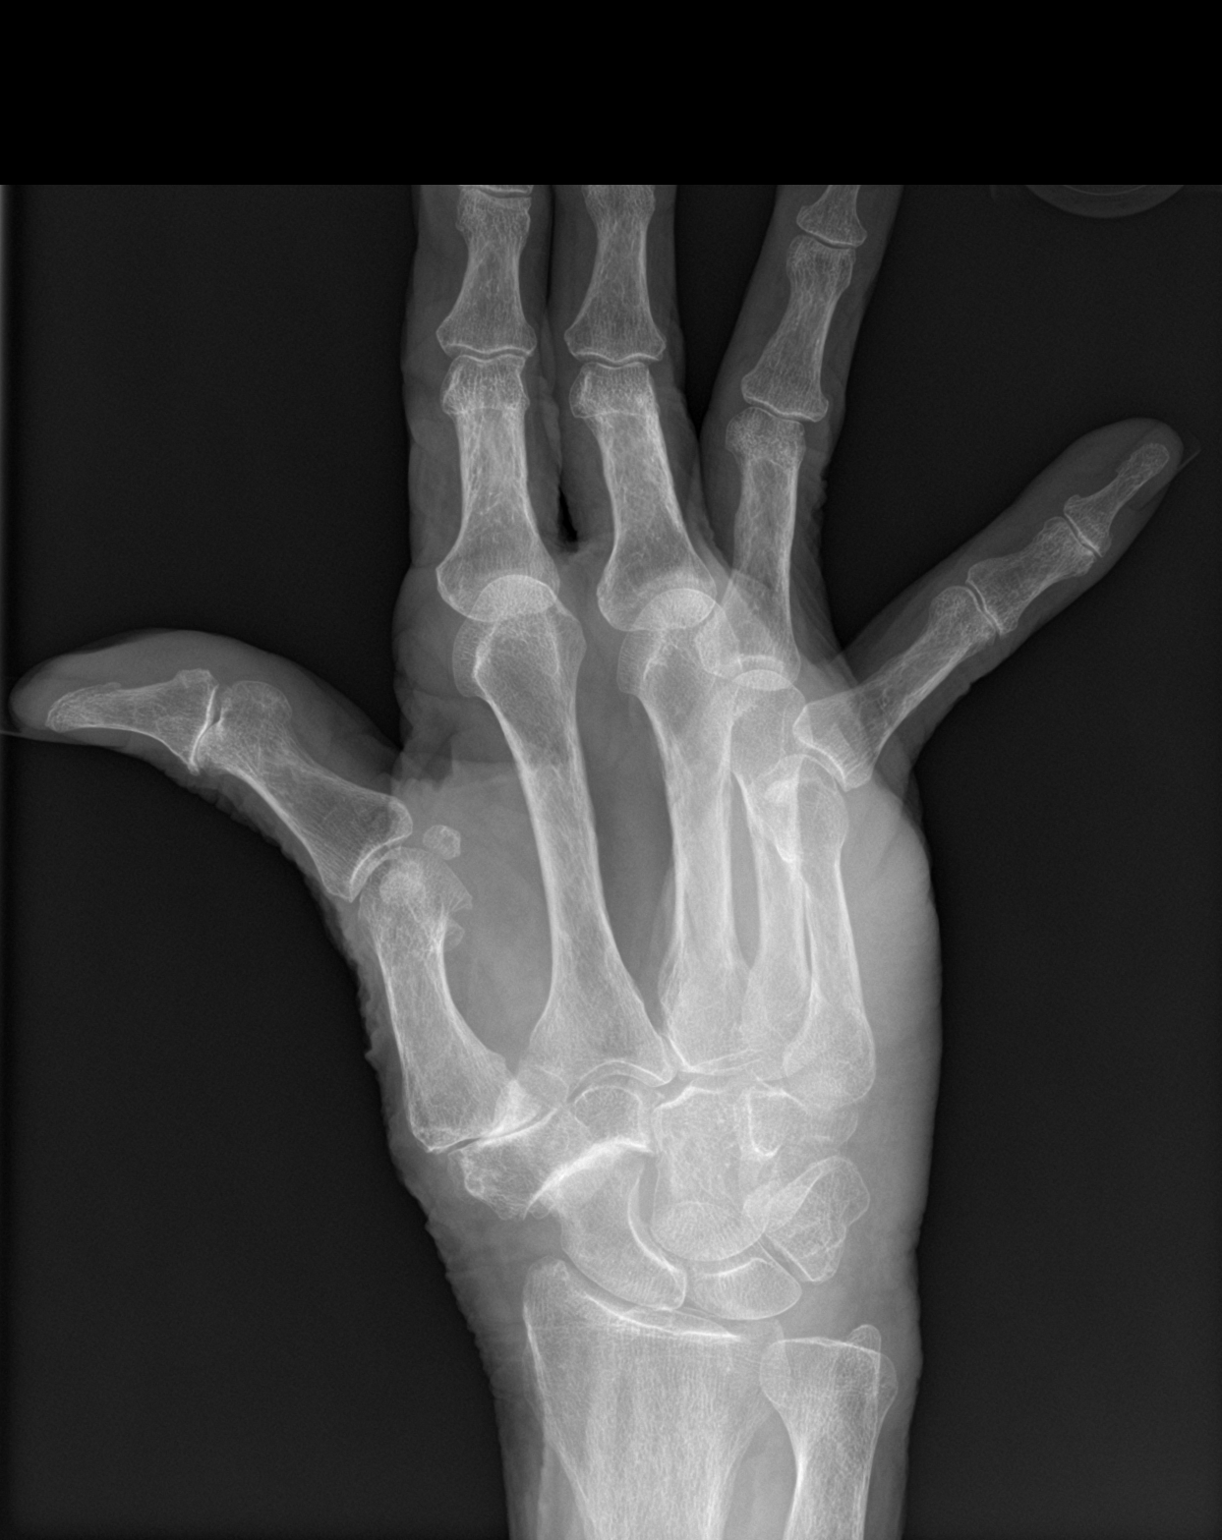

[hand lat]
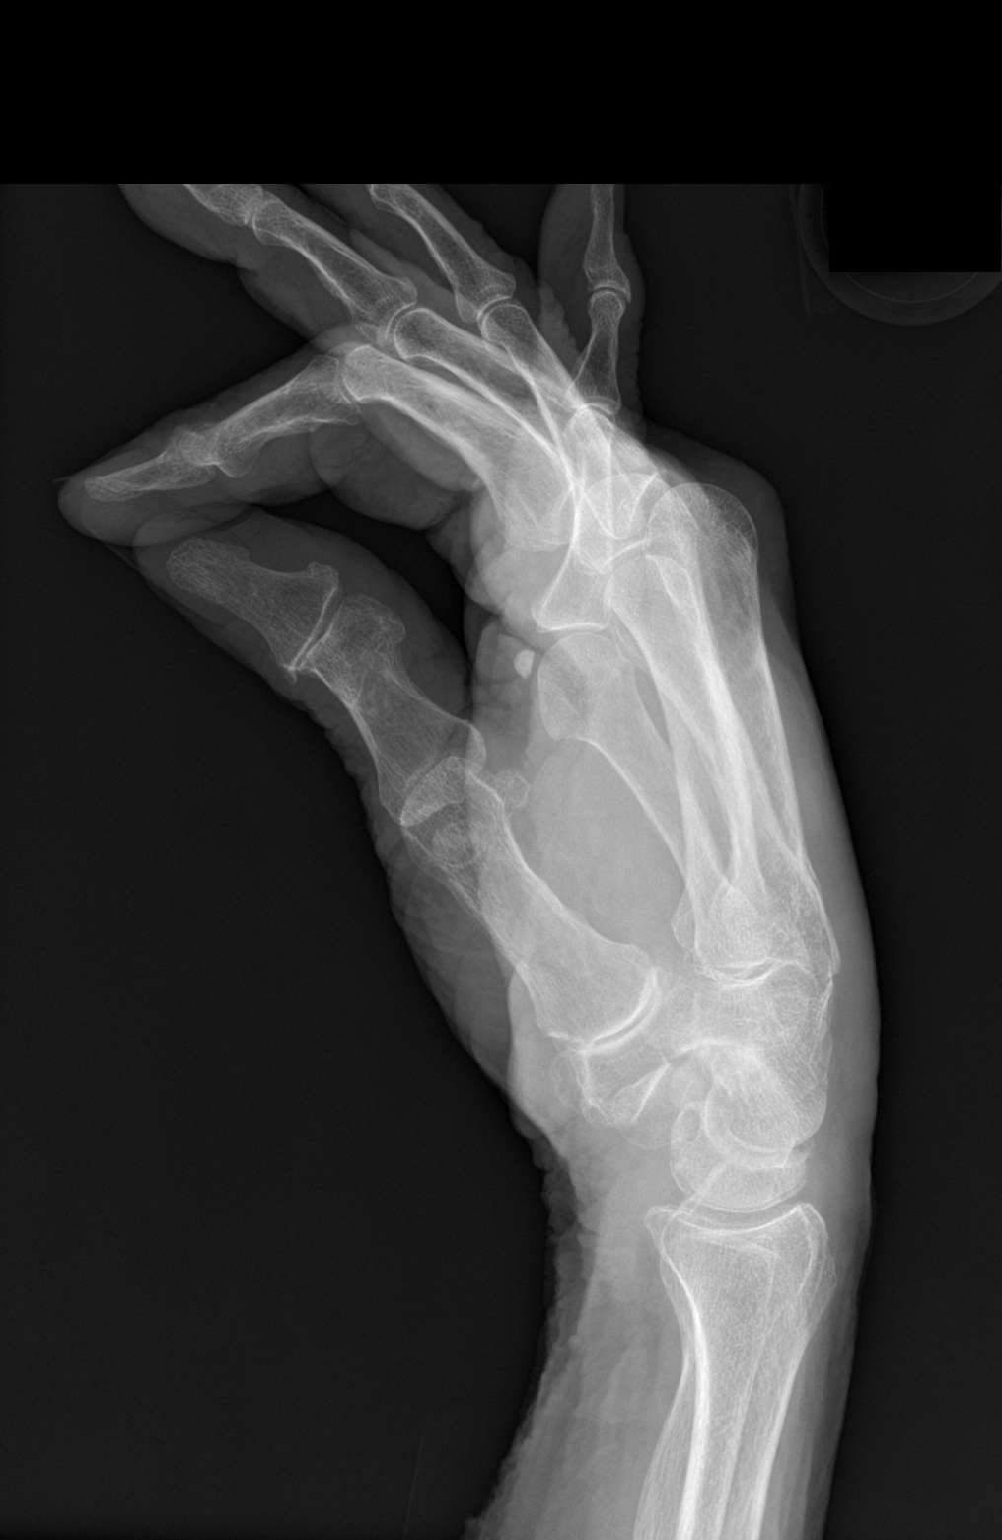

[3 of 3 positions shown; findings below may reference images not displayed]

FINDINGS: No acute fracture or dislocation. Scattered degenerative
osteoarthritic changes seen throughout the hand, most notable at the
first CMC joint. No acute soft tissue abnormality. Osteopenia noted.
IMPRESSION: 1. No acute fracture or dislocation.
2. Degenerative osteoarthrosis, most notable at the first CMC joint.

## 2017-04-06 DIAGNOSIS — I2510 Atherosclerotic heart disease of native coronary artery without angina pectoris: Secondary | ICD-10-CM | POA: Diagnosis not present

## 2017-04-06 DIAGNOSIS — I4891 Unspecified atrial fibrillation: Secondary | ICD-10-CM | POA: Diagnosis not present

## 2017-04-06 DIAGNOSIS — R2689 Other abnormalities of gait and mobility: Secondary | ICD-10-CM | POA: Diagnosis not present

## 2017-04-17 IMAGING — CR DG CHEST 1V
1 series · 1 of 1 positions shown · non-contrast
Comparison: 07/06/2016

CLINICAL DATA: Fall yesterday. Atrial fibrillation and congestive
heart failure. Initial encounter.

EXAM:
CHEST 1 VIEW

[x chest ap]
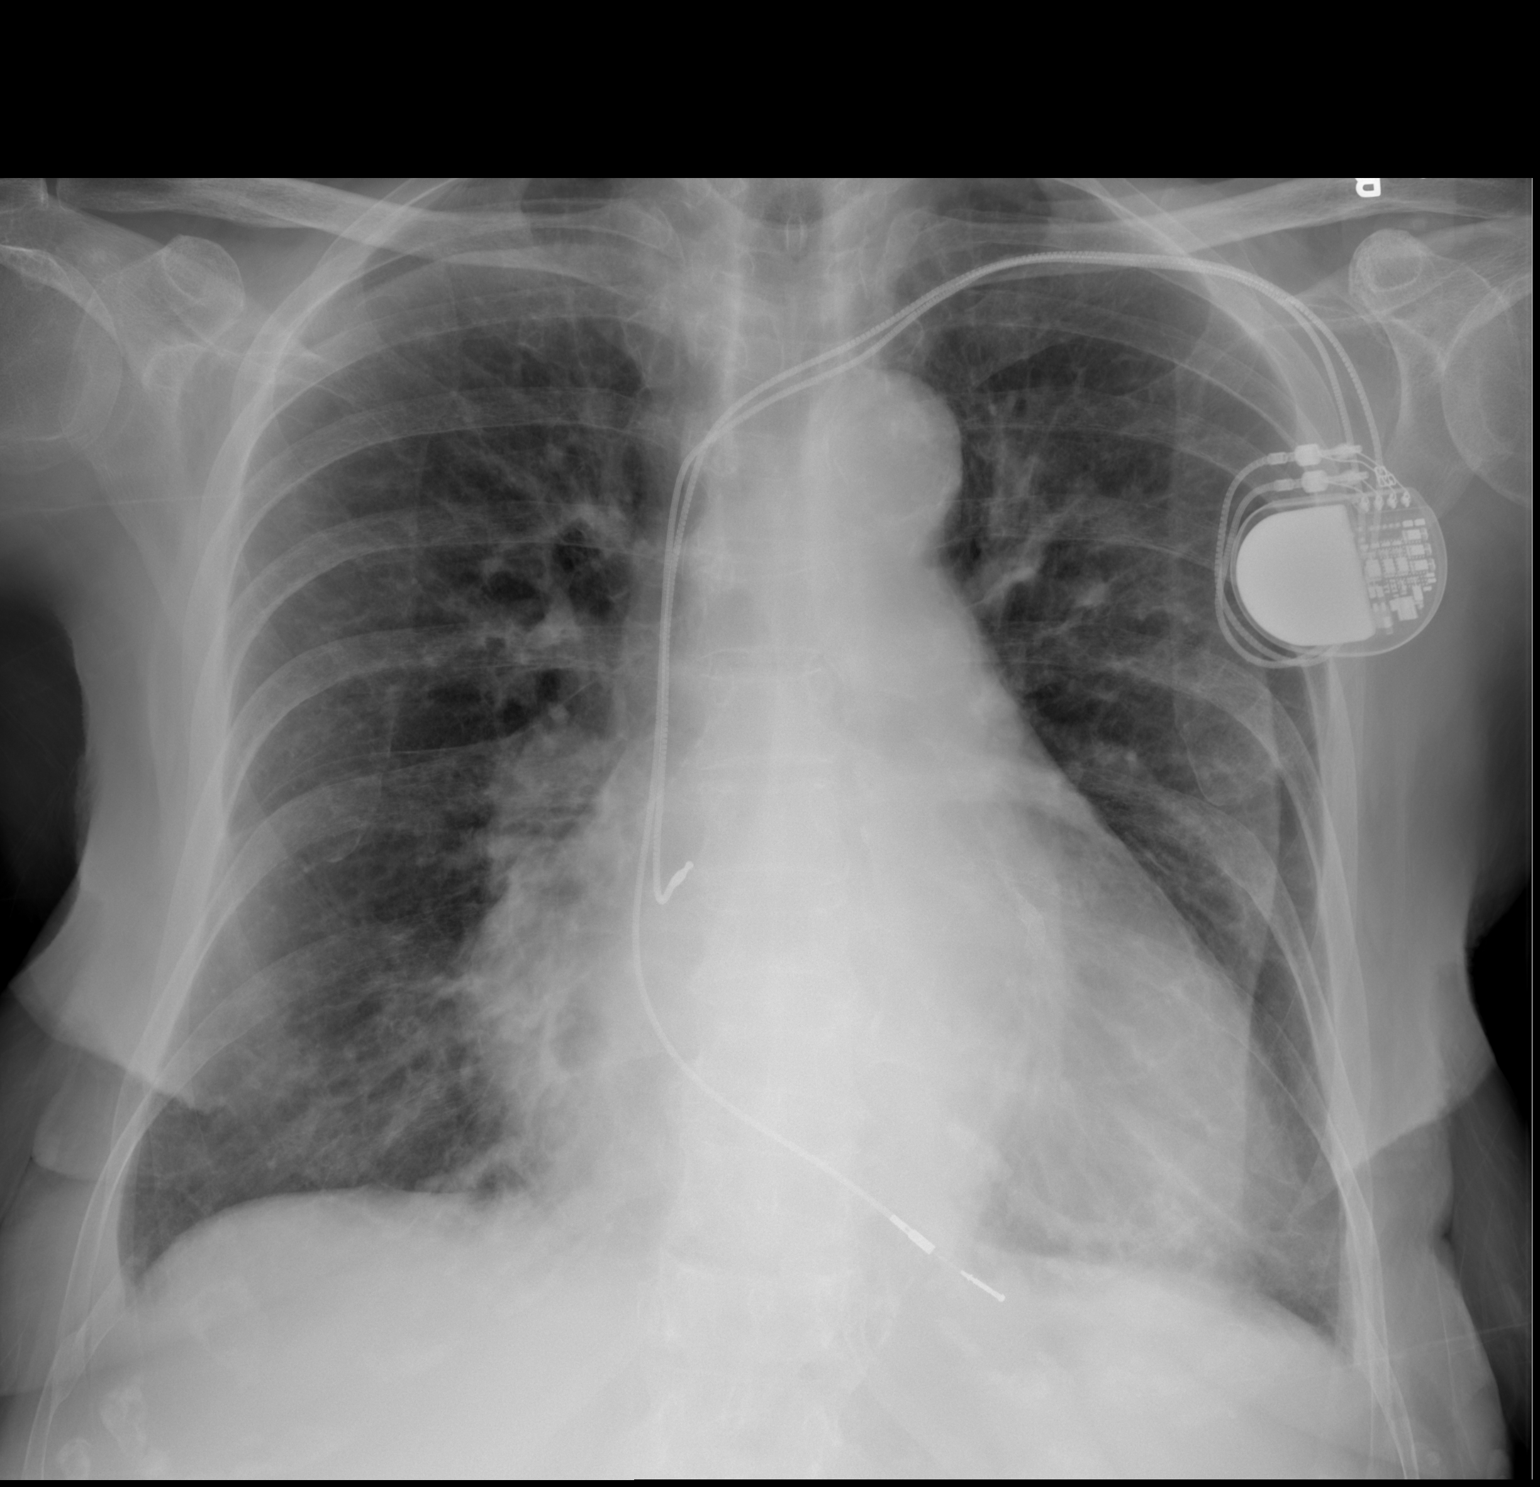

[1 of 1 positions shown; findings below may reference images not displayed]

FINDINGS: Stable moderate cardiomegaly. Aortic atherosclerosis. Dual lead
transvenous pacemaker remains in appropriate position.

No evidence of pulmonary infiltrate or edema. No evidence of pleural
effusion. Mild hyperinflation again noted.
IMPRESSION: Stable cardiomegaly.  No active lung disease.

## 2017-04-17 IMAGING — CR DG THORACIC SPINE 2V
3 series · 3 of 3 positions shown · non-contrast
Comparison: Chest CT on 05/14/2016

CLINICAL DATA: Fall yesterday at home. Thoracic back pain. Initial
encounter.

EXAM:
THORACIC SPINE 2 VIEWS

[t thoracic spine ap]
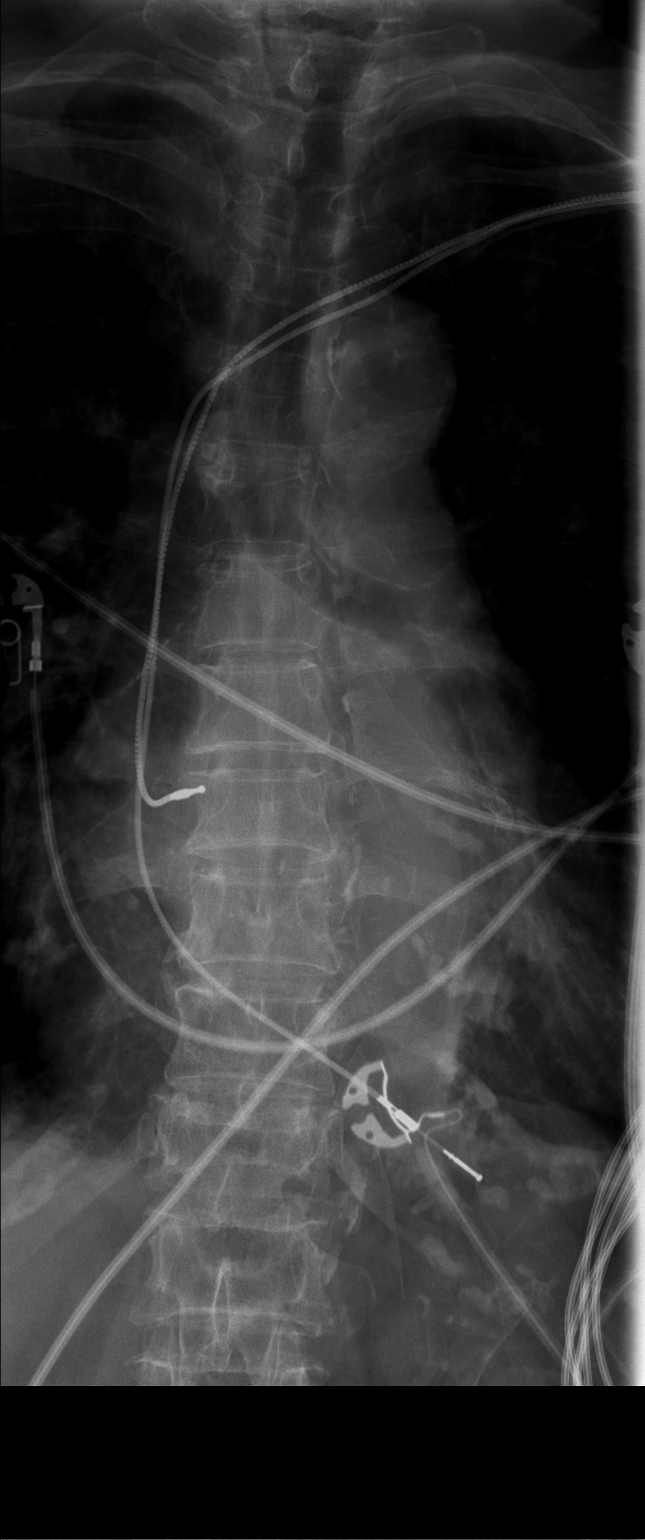

[t thoracic spine lat]
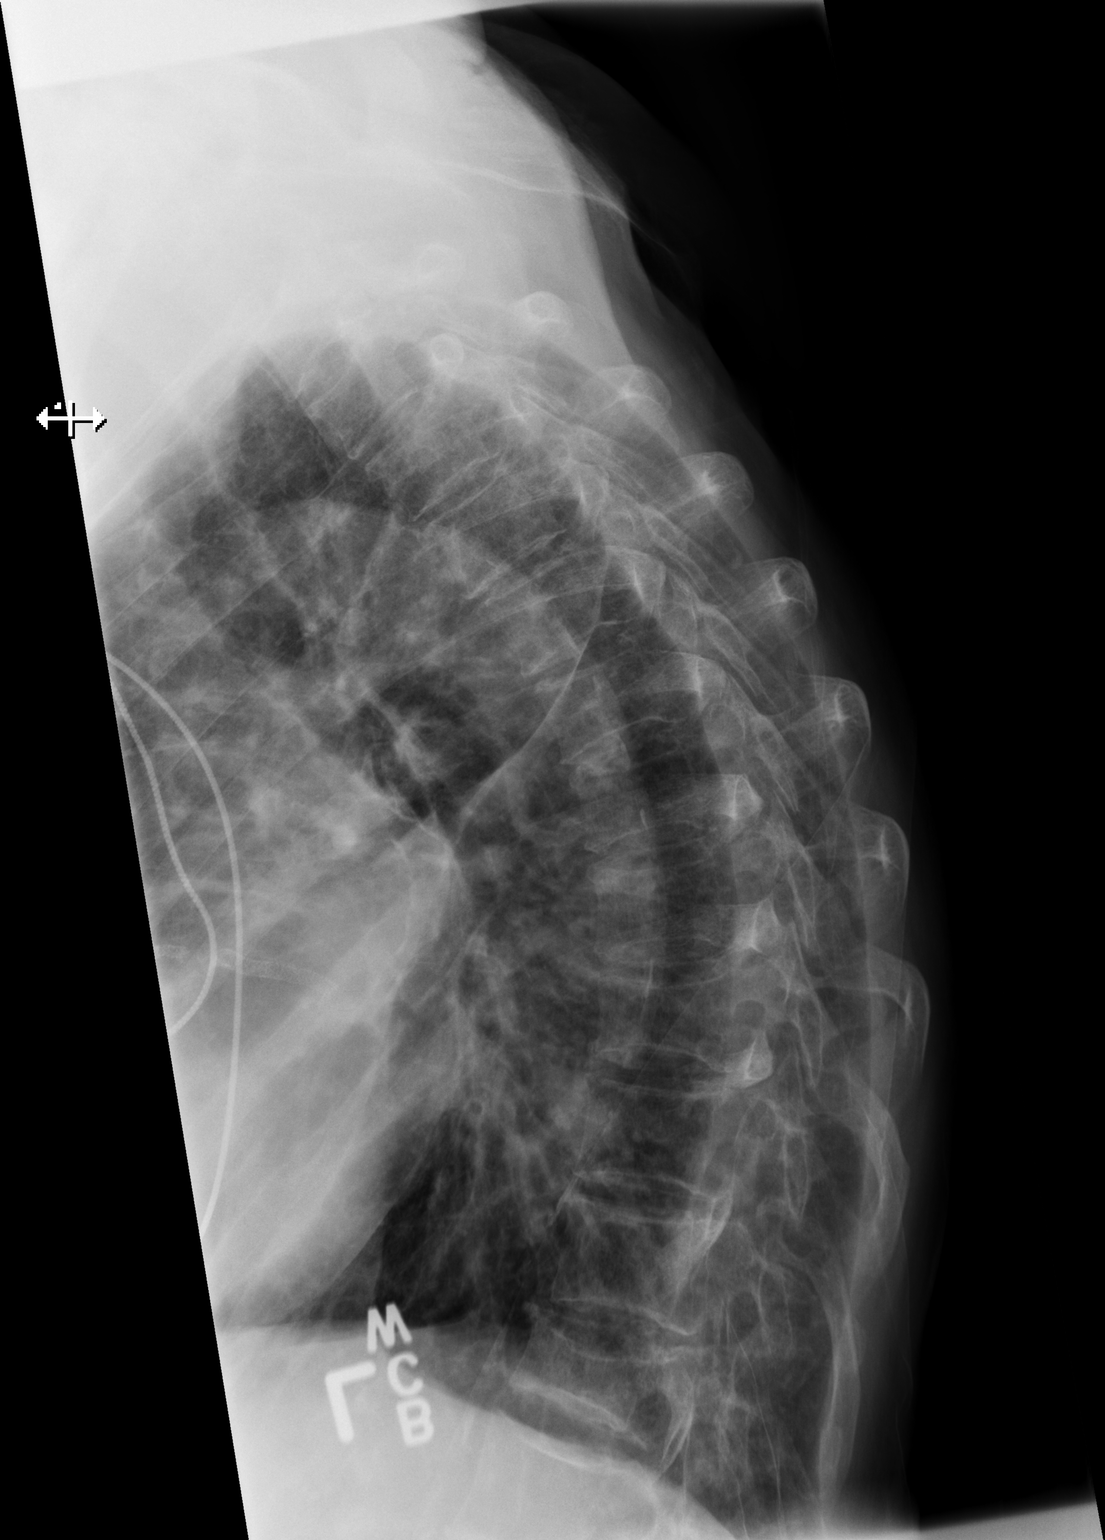

[t thoracic swimmers]
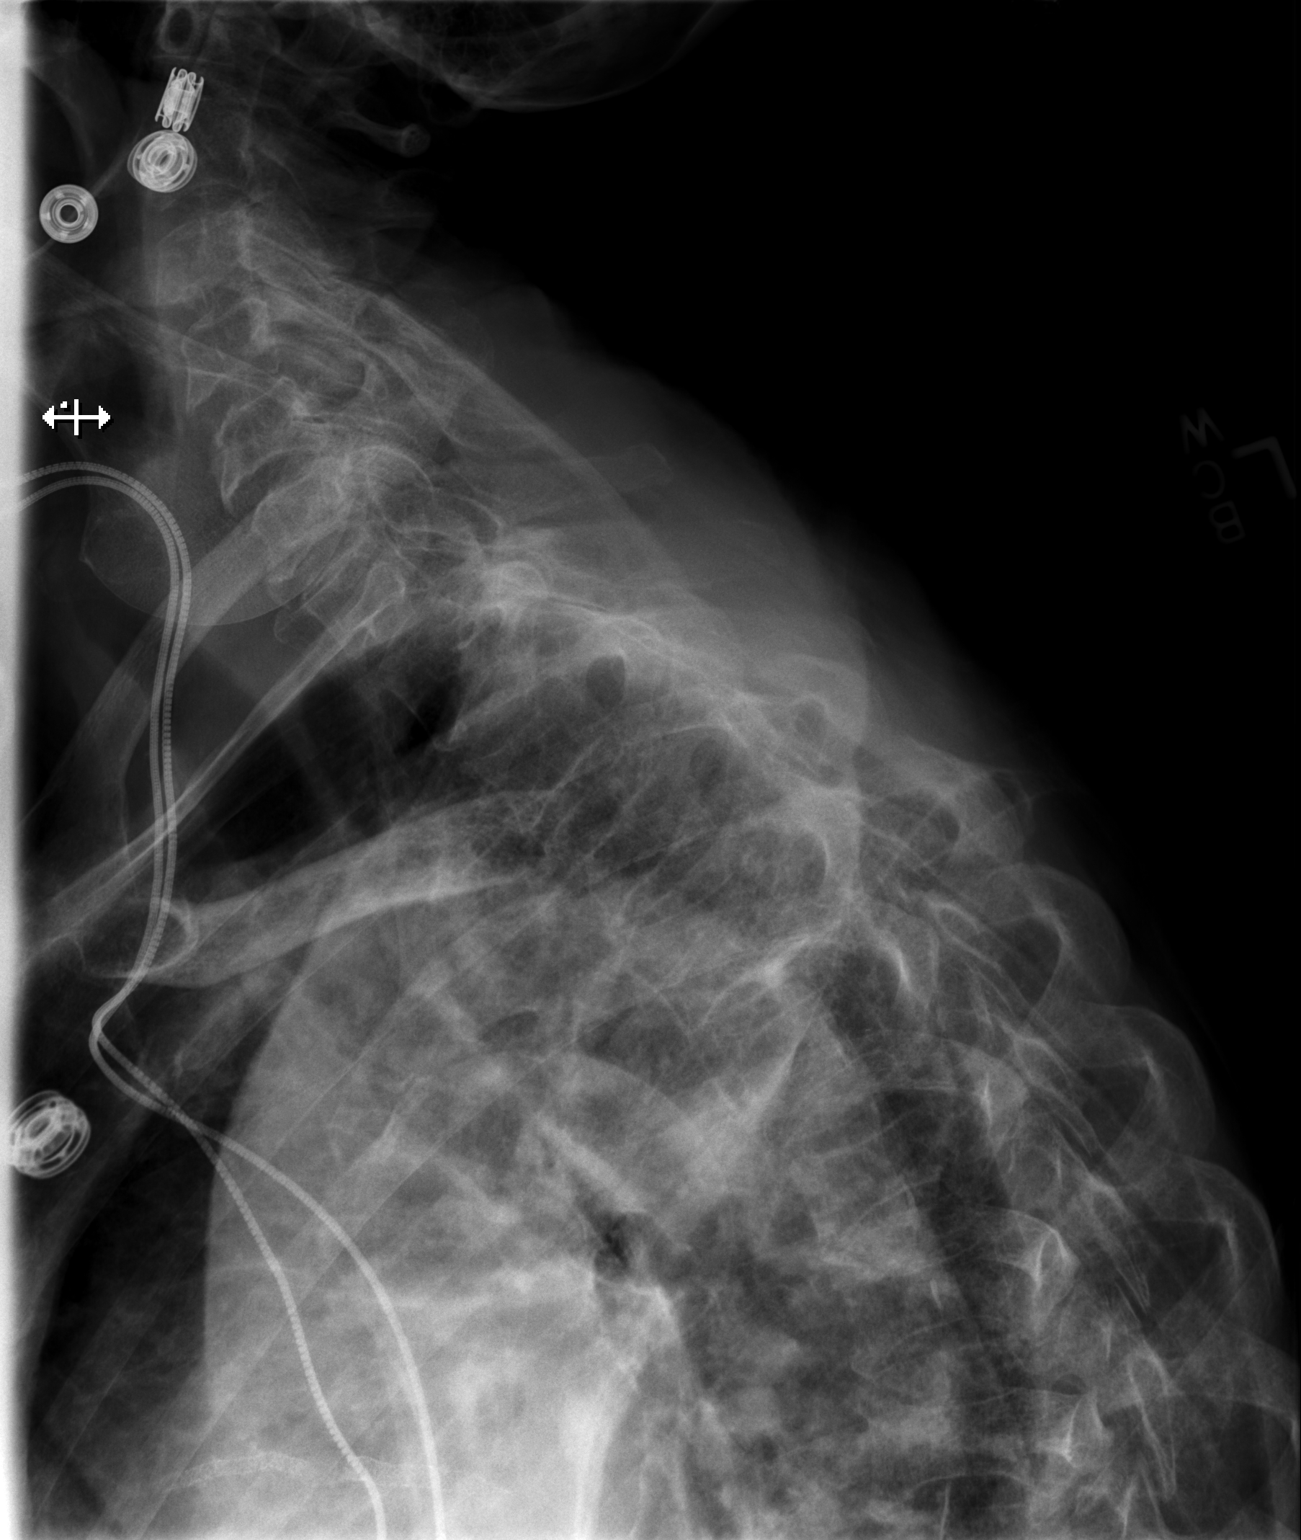

[3 of 3 positions shown; findings below may reference images not displayed]

FINDINGS: There is no evidence of acute thoracic spine fracture. Alignment is
normal. No other significant bone abnormalities are identified.

Old compression fracture of T12 vertebral body is again seen.
Intervertebral disc spaces are maintained. No focal lytic or
sclerotic bone lesions identified.
IMPRESSION: No acute findings. Old T12 vertebral body compression fracture again
noted.

## 2017-04-24 DIAGNOSIS — R278 Other lack of coordination: Secondary | ICD-10-CM | POA: Diagnosis not present

## 2017-04-24 DIAGNOSIS — Z5181 Encounter for therapeutic drug level monitoring: Secondary | ICD-10-CM | POA: Diagnosis not present

## 2017-04-24 DIAGNOSIS — E1140 Type 2 diabetes mellitus with diabetic neuropathy, unspecified: Secondary | ICD-10-CM | POA: Diagnosis not present

## 2017-04-24 DIAGNOSIS — M810 Age-related osteoporosis without current pathological fracture: Secondary | ICD-10-CM | POA: Diagnosis not present

## 2017-04-24 DIAGNOSIS — I10 Essential (primary) hypertension: Secondary | ICD-10-CM | POA: Diagnosis not present

## 2017-04-24 DIAGNOSIS — M6281 Muscle weakness (generalized): Secondary | ICD-10-CM | POA: Diagnosis not present

## 2017-04-24 DIAGNOSIS — E039 Hypothyroidism, unspecified: Secondary | ICD-10-CM | POA: Diagnosis not present

## 2017-04-24 DIAGNOSIS — I4891 Unspecified atrial fibrillation: Secondary | ICD-10-CM | POA: Diagnosis not present

## 2017-04-24 DIAGNOSIS — Z950 Presence of cardiac pacemaker: Secondary | ICD-10-CM | POA: Diagnosis not present

## 2017-04-24 DIAGNOSIS — R2689 Other abnormalities of gait and mobility: Secondary | ICD-10-CM | POA: Diagnosis not present

## 2017-04-24 DIAGNOSIS — M25552 Pain in left hip: Secondary | ICD-10-CM | POA: Diagnosis not present

## 2017-04-25 DIAGNOSIS — R278 Other lack of coordination: Secondary | ICD-10-CM | POA: Diagnosis not present

## 2017-04-25 DIAGNOSIS — E1140 Type 2 diabetes mellitus with diabetic neuropathy, unspecified: Secondary | ICD-10-CM | POA: Diagnosis not present

## 2017-04-25 DIAGNOSIS — Z5181 Encounter for therapeutic drug level monitoring: Secondary | ICD-10-CM | POA: Diagnosis not present

## 2017-04-25 DIAGNOSIS — M810 Age-related osteoporosis without current pathological fracture: Secondary | ICD-10-CM | POA: Diagnosis not present

## 2017-04-25 DIAGNOSIS — Z950 Presence of cardiac pacemaker: Secondary | ICD-10-CM | POA: Diagnosis not present

## 2017-04-25 DIAGNOSIS — M25552 Pain in left hip: Secondary | ICD-10-CM | POA: Diagnosis not present

## 2017-04-25 DIAGNOSIS — I10 Essential (primary) hypertension: Secondary | ICD-10-CM | POA: Diagnosis not present

## 2017-04-25 DIAGNOSIS — M6281 Muscle weakness (generalized): Secondary | ICD-10-CM | POA: Diagnosis not present

## 2017-04-25 DIAGNOSIS — R2689 Other abnormalities of gait and mobility: Secondary | ICD-10-CM | POA: Diagnosis not present

## 2017-04-25 DIAGNOSIS — E039 Hypothyroidism, unspecified: Secondary | ICD-10-CM | POA: Diagnosis not present

## 2017-04-25 DIAGNOSIS — I4891 Unspecified atrial fibrillation: Secondary | ICD-10-CM | POA: Diagnosis not present

## 2017-04-26 DIAGNOSIS — Z950 Presence of cardiac pacemaker: Secondary | ICD-10-CM | POA: Diagnosis not present

## 2017-04-26 DIAGNOSIS — Z5181 Encounter for therapeutic drug level monitoring: Secondary | ICD-10-CM | POA: Diagnosis not present

## 2017-04-26 DIAGNOSIS — E039 Hypothyroidism, unspecified: Secondary | ICD-10-CM | POA: Diagnosis not present

## 2017-04-26 DIAGNOSIS — I10 Essential (primary) hypertension: Secondary | ICD-10-CM | POA: Diagnosis not present

## 2017-04-26 DIAGNOSIS — I4891 Unspecified atrial fibrillation: Secondary | ICD-10-CM | POA: Diagnosis not present

## 2017-04-26 DIAGNOSIS — M810 Age-related osteoporosis without current pathological fracture: Secondary | ICD-10-CM | POA: Diagnosis not present

## 2017-04-26 DIAGNOSIS — R2689 Other abnormalities of gait and mobility: Secondary | ICD-10-CM | POA: Diagnosis not present

## 2017-04-26 DIAGNOSIS — M6281 Muscle weakness (generalized): Secondary | ICD-10-CM | POA: Diagnosis not present

## 2017-04-26 DIAGNOSIS — E1140 Type 2 diabetes mellitus with diabetic neuropathy, unspecified: Secondary | ICD-10-CM | POA: Diagnosis not present

## 2017-04-26 DIAGNOSIS — M25552 Pain in left hip: Secondary | ICD-10-CM | POA: Diagnosis not present

## 2017-04-26 DIAGNOSIS — R278 Other lack of coordination: Secondary | ICD-10-CM | POA: Diagnosis not present

## 2017-04-27 DIAGNOSIS — R2689 Other abnormalities of gait and mobility: Secondary | ICD-10-CM | POA: Diagnosis not present

## 2017-04-27 DIAGNOSIS — M810 Age-related osteoporosis without current pathological fracture: Secondary | ICD-10-CM | POA: Diagnosis not present

## 2017-04-27 DIAGNOSIS — R278 Other lack of coordination: Secondary | ICD-10-CM | POA: Diagnosis not present

## 2017-04-27 DIAGNOSIS — M6281 Muscle weakness (generalized): Secondary | ICD-10-CM | POA: Diagnosis not present

## 2017-04-27 DIAGNOSIS — Z5181 Encounter for therapeutic drug level monitoring: Secondary | ICD-10-CM | POA: Diagnosis not present

## 2017-04-27 DIAGNOSIS — M25552 Pain in left hip: Secondary | ICD-10-CM | POA: Diagnosis not present

## 2017-04-27 DIAGNOSIS — E039 Hypothyroidism, unspecified: Secondary | ICD-10-CM | POA: Diagnosis not present

## 2017-04-27 DIAGNOSIS — E1140 Type 2 diabetes mellitus with diabetic neuropathy, unspecified: Secondary | ICD-10-CM | POA: Diagnosis not present

## 2017-04-27 DIAGNOSIS — I10 Essential (primary) hypertension: Secondary | ICD-10-CM | POA: Diagnosis not present

## 2017-04-27 DIAGNOSIS — Z950 Presence of cardiac pacemaker: Secondary | ICD-10-CM | POA: Diagnosis not present

## 2017-04-27 DIAGNOSIS — I4891 Unspecified atrial fibrillation: Secondary | ICD-10-CM | POA: Diagnosis not present

## 2017-04-30 DIAGNOSIS — I4891 Unspecified atrial fibrillation: Secondary | ICD-10-CM | POA: Diagnosis not present

## 2017-04-30 DIAGNOSIS — R2689 Other abnormalities of gait and mobility: Secondary | ICD-10-CM | POA: Diagnosis not present

## 2017-04-30 DIAGNOSIS — I10 Essential (primary) hypertension: Secondary | ICD-10-CM | POA: Diagnosis not present

## 2017-04-30 DIAGNOSIS — E039 Hypothyroidism, unspecified: Secondary | ICD-10-CM | POA: Diagnosis not present

## 2017-04-30 DIAGNOSIS — M25552 Pain in left hip: Secondary | ICD-10-CM | POA: Diagnosis not present

## 2017-04-30 DIAGNOSIS — E1140 Type 2 diabetes mellitus with diabetic neuropathy, unspecified: Secondary | ICD-10-CM | POA: Diagnosis not present

## 2017-04-30 DIAGNOSIS — M6281 Muscle weakness (generalized): Secondary | ICD-10-CM | POA: Diagnosis not present

## 2017-04-30 DIAGNOSIS — M810 Age-related osteoporosis without current pathological fracture: Secondary | ICD-10-CM | POA: Diagnosis not present

## 2017-04-30 DIAGNOSIS — R278 Other lack of coordination: Secondary | ICD-10-CM | POA: Diagnosis not present

## 2017-04-30 DIAGNOSIS — Z5181 Encounter for therapeutic drug level monitoring: Secondary | ICD-10-CM | POA: Diagnosis not present

## 2017-04-30 DIAGNOSIS — Z950 Presence of cardiac pacemaker: Secondary | ICD-10-CM | POA: Diagnosis not present

## 2017-05-01 DIAGNOSIS — M6281 Muscle weakness (generalized): Secondary | ICD-10-CM | POA: Diagnosis not present

## 2017-05-01 DIAGNOSIS — R2689 Other abnormalities of gait and mobility: Secondary | ICD-10-CM | POA: Diagnosis not present

## 2017-05-01 DIAGNOSIS — Z950 Presence of cardiac pacemaker: Secondary | ICD-10-CM | POA: Diagnosis not present

## 2017-05-01 DIAGNOSIS — M25552 Pain in left hip: Secondary | ICD-10-CM | POA: Diagnosis not present

## 2017-05-01 DIAGNOSIS — D649 Anemia, unspecified: Secondary | ICD-10-CM | POA: Diagnosis not present

## 2017-05-01 DIAGNOSIS — E039 Hypothyroidism, unspecified: Secondary | ICD-10-CM | POA: Diagnosis not present

## 2017-05-01 DIAGNOSIS — E1140 Type 2 diabetes mellitus with diabetic neuropathy, unspecified: Secondary | ICD-10-CM | POA: Diagnosis not present

## 2017-05-01 DIAGNOSIS — R278 Other lack of coordination: Secondary | ICD-10-CM | POA: Diagnosis not present

## 2017-05-01 DIAGNOSIS — I4891 Unspecified atrial fibrillation: Secondary | ICD-10-CM | POA: Diagnosis not present

## 2017-05-01 DIAGNOSIS — I10 Essential (primary) hypertension: Secondary | ICD-10-CM | POA: Diagnosis not present

## 2017-05-01 DIAGNOSIS — E119 Type 2 diabetes mellitus without complications: Secondary | ICD-10-CM | POA: Diagnosis not present

## 2017-05-01 DIAGNOSIS — E559 Vitamin D deficiency, unspecified: Secondary | ICD-10-CM | POA: Diagnosis not present

## 2017-05-01 DIAGNOSIS — Z79899 Other long term (current) drug therapy: Secondary | ICD-10-CM | POA: Diagnosis not present

## 2017-05-01 DIAGNOSIS — M810 Age-related osteoporosis without current pathological fracture: Secondary | ICD-10-CM | POA: Diagnosis not present

## 2017-05-01 DIAGNOSIS — E785 Hyperlipidemia, unspecified: Secondary | ICD-10-CM | POA: Diagnosis not present

## 2017-05-01 DIAGNOSIS — Z5181 Encounter for therapeutic drug level monitoring: Secondary | ICD-10-CM | POA: Diagnosis not present

## 2017-05-02 DIAGNOSIS — I4891 Unspecified atrial fibrillation: Secondary | ICD-10-CM | POA: Diagnosis not present

## 2017-05-02 DIAGNOSIS — E039 Hypothyroidism, unspecified: Secondary | ICD-10-CM | POA: Diagnosis not present

## 2017-05-02 DIAGNOSIS — I10 Essential (primary) hypertension: Secondary | ICD-10-CM | POA: Diagnosis not present

## 2017-05-02 DIAGNOSIS — R278 Other lack of coordination: Secondary | ICD-10-CM | POA: Diagnosis not present

## 2017-05-02 DIAGNOSIS — M810 Age-related osteoporosis without current pathological fracture: Secondary | ICD-10-CM | POA: Diagnosis not present

## 2017-05-02 DIAGNOSIS — M25552 Pain in left hip: Secondary | ICD-10-CM | POA: Diagnosis not present

## 2017-05-02 DIAGNOSIS — E1140 Type 2 diabetes mellitus with diabetic neuropathy, unspecified: Secondary | ICD-10-CM | POA: Diagnosis not present

## 2017-05-02 DIAGNOSIS — M6281 Muscle weakness (generalized): Secondary | ICD-10-CM | POA: Diagnosis not present

## 2017-05-02 DIAGNOSIS — Z5181 Encounter for therapeutic drug level monitoring: Secondary | ICD-10-CM | POA: Diagnosis not present

## 2017-05-02 DIAGNOSIS — R2689 Other abnormalities of gait and mobility: Secondary | ICD-10-CM | POA: Diagnosis not present

## 2017-05-02 DIAGNOSIS — Z950 Presence of cardiac pacemaker: Secondary | ICD-10-CM | POA: Diagnosis not present

## 2017-05-03 DIAGNOSIS — Z5181 Encounter for therapeutic drug level monitoring: Secondary | ICD-10-CM | POA: Diagnosis not present

## 2017-05-03 DIAGNOSIS — E1140 Type 2 diabetes mellitus with diabetic neuropathy, unspecified: Secondary | ICD-10-CM | POA: Diagnosis not present

## 2017-05-03 DIAGNOSIS — M6281 Muscle weakness (generalized): Secondary | ICD-10-CM | POA: Diagnosis not present

## 2017-05-03 DIAGNOSIS — I10 Essential (primary) hypertension: Secondary | ICD-10-CM | POA: Diagnosis not present

## 2017-05-03 DIAGNOSIS — E039 Hypothyroidism, unspecified: Secondary | ICD-10-CM | POA: Diagnosis not present

## 2017-05-03 DIAGNOSIS — M810 Age-related osteoporosis without current pathological fracture: Secondary | ICD-10-CM | POA: Diagnosis not present

## 2017-05-03 DIAGNOSIS — M25552 Pain in left hip: Secondary | ICD-10-CM | POA: Diagnosis not present

## 2017-05-03 DIAGNOSIS — R278 Other lack of coordination: Secondary | ICD-10-CM | POA: Diagnosis not present

## 2017-05-03 DIAGNOSIS — R2689 Other abnormalities of gait and mobility: Secondary | ICD-10-CM | POA: Diagnosis not present

## 2017-05-03 DIAGNOSIS — Z950 Presence of cardiac pacemaker: Secondary | ICD-10-CM | POA: Diagnosis not present

## 2017-05-03 DIAGNOSIS — I4891 Unspecified atrial fibrillation: Secondary | ICD-10-CM | POA: Diagnosis not present

## 2017-05-04 DIAGNOSIS — I4891 Unspecified atrial fibrillation: Secondary | ICD-10-CM | POA: Diagnosis not present

## 2017-05-04 DIAGNOSIS — M6281 Muscle weakness (generalized): Secondary | ICD-10-CM | POA: Diagnosis not present

## 2017-05-04 DIAGNOSIS — M25552 Pain in left hip: Secondary | ICD-10-CM | POA: Diagnosis not present

## 2017-05-04 DIAGNOSIS — M810 Age-related osteoporosis without current pathological fracture: Secondary | ICD-10-CM | POA: Diagnosis not present

## 2017-05-04 DIAGNOSIS — Z5181 Encounter for therapeutic drug level monitoring: Secondary | ICD-10-CM | POA: Diagnosis not present

## 2017-05-04 DIAGNOSIS — R2689 Other abnormalities of gait and mobility: Secondary | ICD-10-CM | POA: Diagnosis not present

## 2017-05-04 DIAGNOSIS — M1990 Unspecified osteoarthritis, unspecified site: Secondary | ICD-10-CM | POA: Diagnosis not present

## 2017-05-04 DIAGNOSIS — E1140 Type 2 diabetes mellitus with diabetic neuropathy, unspecified: Secondary | ICD-10-CM | POA: Diagnosis not present

## 2017-05-04 DIAGNOSIS — R278 Other lack of coordination: Secondary | ICD-10-CM | POA: Diagnosis not present

## 2017-05-04 DIAGNOSIS — E039 Hypothyroidism, unspecified: Secondary | ICD-10-CM | POA: Diagnosis not present

## 2017-05-04 DIAGNOSIS — I10 Essential (primary) hypertension: Secondary | ICD-10-CM | POA: Diagnosis not present

## 2017-05-04 DIAGNOSIS — Z950 Presence of cardiac pacemaker: Secondary | ICD-10-CM | POA: Diagnosis not present

## 2017-05-07 DIAGNOSIS — I10 Essential (primary) hypertension: Secondary | ICD-10-CM | POA: Diagnosis not present

## 2017-05-07 DIAGNOSIS — R112 Nausea with vomiting, unspecified: Secondary | ICD-10-CM | POA: Diagnosis not present

## 2017-05-07 DIAGNOSIS — D649 Anemia, unspecified: Secondary | ICD-10-CM | POA: Diagnosis not present

## 2017-05-23 DIAGNOSIS — R600 Localized edema: Secondary | ICD-10-CM | POA: Diagnosis not present

## 2017-05-23 DIAGNOSIS — I5032 Chronic diastolic (congestive) heart failure: Secondary | ICD-10-CM | POA: Diagnosis not present

## 2017-05-23 DIAGNOSIS — L03115 Cellulitis of right lower limb: Secondary | ICD-10-CM | POA: Diagnosis not present

## 2017-05-23 DIAGNOSIS — I2510 Atherosclerotic heart disease of native coronary artery without angina pectoris: Secondary | ICD-10-CM | POA: Diagnosis not present

## 2017-05-25 DIAGNOSIS — R2689 Other abnormalities of gait and mobility: Secondary | ICD-10-CM | POA: Diagnosis not present

## 2017-05-25 DIAGNOSIS — Z950 Presence of cardiac pacemaker: Secondary | ICD-10-CM | POA: Diagnosis not present

## 2017-05-25 DIAGNOSIS — I10 Essential (primary) hypertension: Secondary | ICD-10-CM | POA: Diagnosis not present

## 2017-05-25 DIAGNOSIS — E039 Hypothyroidism, unspecified: Secondary | ICD-10-CM | POA: Diagnosis not present

## 2017-05-25 DIAGNOSIS — E1140 Type 2 diabetes mellitus with diabetic neuropathy, unspecified: Secondary | ICD-10-CM | POA: Diagnosis not present

## 2017-05-25 DIAGNOSIS — M810 Age-related osteoporosis without current pathological fracture: Secondary | ICD-10-CM | POA: Diagnosis not present

## 2017-05-25 DIAGNOSIS — I4891 Unspecified atrial fibrillation: Secondary | ICD-10-CM | POA: Diagnosis not present

## 2017-05-25 DIAGNOSIS — Z5181 Encounter for therapeutic drug level monitoring: Secondary | ICD-10-CM | POA: Diagnosis not present

## 2017-05-25 DIAGNOSIS — R278 Other lack of coordination: Secondary | ICD-10-CM | POA: Diagnosis not present

## 2017-05-28 DIAGNOSIS — E039 Hypothyroidism, unspecified: Secondary | ICD-10-CM | POA: Diagnosis not present

## 2017-05-28 DIAGNOSIS — Z950 Presence of cardiac pacemaker: Secondary | ICD-10-CM | POA: Diagnosis not present

## 2017-05-28 DIAGNOSIS — M810 Age-related osteoporosis without current pathological fracture: Secondary | ICD-10-CM | POA: Diagnosis not present

## 2017-05-28 DIAGNOSIS — R278 Other lack of coordination: Secondary | ICD-10-CM | POA: Diagnosis not present

## 2017-05-28 DIAGNOSIS — E1140 Type 2 diabetes mellitus with diabetic neuropathy, unspecified: Secondary | ICD-10-CM | POA: Diagnosis not present

## 2017-05-28 DIAGNOSIS — R2689 Other abnormalities of gait and mobility: Secondary | ICD-10-CM | POA: Diagnosis not present

## 2017-05-28 DIAGNOSIS — I10 Essential (primary) hypertension: Secondary | ICD-10-CM | POA: Diagnosis not present

## 2017-05-28 DIAGNOSIS — Z5181 Encounter for therapeutic drug level monitoring: Secondary | ICD-10-CM | POA: Diagnosis not present

## 2017-05-28 DIAGNOSIS — I4891 Unspecified atrial fibrillation: Secondary | ICD-10-CM | POA: Diagnosis not present

## 2017-05-29 DIAGNOSIS — I5032 Chronic diastolic (congestive) heart failure: Secondary | ICD-10-CM | POA: Diagnosis not present

## 2017-05-29 DIAGNOSIS — E039 Hypothyroidism, unspecified: Secondary | ICD-10-CM | POA: Diagnosis not present

## 2017-05-29 DIAGNOSIS — R600 Localized edema: Secondary | ICD-10-CM | POA: Diagnosis not present

## 2017-05-29 DIAGNOSIS — L03115 Cellulitis of right lower limb: Secondary | ICD-10-CM | POA: Diagnosis not present

## 2017-06-01 DIAGNOSIS — I739 Peripheral vascular disease, unspecified: Secondary | ICD-10-CM | POA: Diagnosis not present

## 2017-06-01 DIAGNOSIS — R262 Difficulty in walking, not elsewhere classified: Secondary | ICD-10-CM | POA: Diagnosis not present

## 2017-06-01 DIAGNOSIS — B351 Tinea unguium: Secondary | ICD-10-CM | POA: Diagnosis not present

## 2017-06-04 DIAGNOSIS — R278 Other lack of coordination: Secondary | ICD-10-CM | POA: Diagnosis not present

## 2017-06-04 DIAGNOSIS — I10 Essential (primary) hypertension: Secondary | ICD-10-CM | POA: Diagnosis not present

## 2017-06-04 DIAGNOSIS — E039 Hypothyroidism, unspecified: Secondary | ICD-10-CM | POA: Diagnosis not present

## 2017-06-04 DIAGNOSIS — Z950 Presence of cardiac pacemaker: Secondary | ICD-10-CM | POA: Diagnosis not present

## 2017-06-04 DIAGNOSIS — M810 Age-related osteoporosis without current pathological fracture: Secondary | ICD-10-CM | POA: Diagnosis not present

## 2017-06-04 DIAGNOSIS — R2689 Other abnormalities of gait and mobility: Secondary | ICD-10-CM | POA: Diagnosis not present

## 2017-06-04 DIAGNOSIS — E1140 Type 2 diabetes mellitus with diabetic neuropathy, unspecified: Secondary | ICD-10-CM | POA: Diagnosis not present

## 2017-06-04 DIAGNOSIS — Z5181 Encounter for therapeutic drug level monitoring: Secondary | ICD-10-CM | POA: Diagnosis not present

## 2017-06-04 DIAGNOSIS — I4891 Unspecified atrial fibrillation: Secondary | ICD-10-CM | POA: Diagnosis not present

## 2017-06-05 DIAGNOSIS — R600 Localized edema: Secondary | ICD-10-CM | POA: Diagnosis not present

## 2017-06-05 DIAGNOSIS — I2510 Atherosclerotic heart disease of native coronary artery without angina pectoris: Secondary | ICD-10-CM | POA: Diagnosis not present

## 2017-06-05 DIAGNOSIS — R05 Cough: Secondary | ICD-10-CM | POA: Diagnosis not present

## 2017-06-05 DIAGNOSIS — I5032 Chronic diastolic (congestive) heart failure: Secondary | ICD-10-CM | POA: Diagnosis not present

## 2017-06-06 ENCOUNTER — Other Ambulatory Visit (HOSPITAL_COMMUNITY): Payer: Self-pay | Admitting: Internal Medicine

## 2017-06-06 DIAGNOSIS — I5032 Chronic diastolic (congestive) heart failure: Secondary | ICD-10-CM | POA: Diagnosis not present

## 2017-06-06 DIAGNOSIS — R600 Localized edema: Secondary | ICD-10-CM | POA: Diagnosis not present

## 2017-06-06 DIAGNOSIS — J90 Pleural effusion, not elsewhere classified: Secondary | ICD-10-CM

## 2017-06-06 DIAGNOSIS — J189 Pneumonia, unspecified organism: Secondary | ICD-10-CM | POA: Diagnosis not present

## 2017-06-07 ENCOUNTER — Ambulatory Visit (HOSPITAL_COMMUNITY)
Admission: RE | Admit: 2017-06-07 | Discharge: 2017-06-07 | Disposition: A | Discharge status: Home / Self Care | Payer: Medicare Other | Source: Ambulatory Visit | Attending: Internal Medicine | Admitting: Internal Medicine

## 2017-06-07 ENCOUNTER — Ambulatory Visit (HOSPITAL_COMMUNITY)
Admission: RE | Admit: 2017-06-07 | Discharge: 2017-06-07 | Disposition: A | Discharge status: Home / Self Care | Payer: Medicare Other | Source: Ambulatory Visit | Attending: Radiology | Admitting: Radiology

## 2017-06-07 ENCOUNTER — Encounter (HOSPITAL_COMMUNITY): Payer: Self-pay | Admitting: Radiology

## 2017-06-07 DIAGNOSIS — J9811 Atelectasis: Secondary | ICD-10-CM | POA: Diagnosis not present

## 2017-06-07 DIAGNOSIS — J90 Pleural effusion, not elsewhere classified: Secondary | ICD-10-CM | POA: Insufficient documentation

## 2017-06-07 DIAGNOSIS — I10 Essential (primary) hypertension: Secondary | ICD-10-CM | POA: Diagnosis not present

## 2017-06-07 DIAGNOSIS — R0602 Shortness of breath: Secondary | ICD-10-CM | POA: Diagnosis not present

## 2017-06-07 HISTORY — PX: IR THORACENTESIS ASP PLEURAL SPACE W/IMG GUIDE: IMG5380

## 2017-06-07 MED ORDER — LIDOCAINE HCL (PF) 2 % IJ SOLN
INTRAMUSCULAR | Status: DC | PRN
  Administered 2017-06-07: 5 mL

## 2017-06-07 MED ORDER — LIDOCAINE 2% (20 MG/ML) 5 ML SYRINGE
INTRAMUSCULAR | Status: AC
  Filled 2017-06-07: qty 10

## 2017-06-07 NOTE — Procedures (Signed)
PROCEDURE SUMMARY:  Successful US guided right thoracentesis. Yielded 1.5 L of cloudy yellow fluid. Pt tolerated procedure well. No immediate complications.  Specimen was not sent for labs. CXR ordered.  Brayton ElBRUNING, Nash Bolls PA-C 06/07/2017 10:28 AM

## 2017-06-08 DIAGNOSIS — I5032 Chronic diastolic (congestive) heart failure: Secondary | ICD-10-CM | POA: Diagnosis not present

## 2017-06-08 DIAGNOSIS — J189 Pneumonia, unspecified organism: Secondary | ICD-10-CM | POA: Diagnosis not present

## 2017-06-08 DIAGNOSIS — I2510 Atherosclerotic heart disease of native coronary artery without angina pectoris: Secondary | ICD-10-CM | POA: Diagnosis not present

## 2017-06-12 DIAGNOSIS — Z950 Presence of cardiac pacemaker: Secondary | ICD-10-CM | POA: Diagnosis not present

## 2017-06-12 DIAGNOSIS — R2689 Other abnormalities of gait and mobility: Secondary | ICD-10-CM | POA: Diagnosis not present

## 2017-06-12 DIAGNOSIS — I4891 Unspecified atrial fibrillation: Secondary | ICD-10-CM | POA: Diagnosis not present

## 2017-06-12 DIAGNOSIS — I10 Essential (primary) hypertension: Secondary | ICD-10-CM | POA: Diagnosis not present

## 2017-06-12 DIAGNOSIS — Z5181 Encounter for therapeutic drug level monitoring: Secondary | ICD-10-CM | POA: Diagnosis not present

## 2017-06-12 DIAGNOSIS — M810 Age-related osteoporosis without current pathological fracture: Secondary | ICD-10-CM | POA: Diagnosis not present

## 2017-06-12 DIAGNOSIS — E039 Hypothyroidism, unspecified: Secondary | ICD-10-CM | POA: Diagnosis not present

## 2017-06-12 DIAGNOSIS — E1140 Type 2 diabetes mellitus with diabetic neuropathy, unspecified: Secondary | ICD-10-CM | POA: Diagnosis not present

## 2017-06-12 DIAGNOSIS — R278 Other lack of coordination: Secondary | ICD-10-CM | POA: Diagnosis not present

## 2017-06-15 DIAGNOSIS — R601 Generalized edema: Secondary | ICD-10-CM | POA: Diagnosis not present

## 2017-06-15 DIAGNOSIS — I509 Heart failure, unspecified: Secondary | ICD-10-CM | POA: Diagnosis not present

## 2017-06-15 DIAGNOSIS — J90 Pleural effusion, not elsewhere classified: Secondary | ICD-10-CM | POA: Diagnosis not present

## 2017-06-15 DIAGNOSIS — I2510 Atherosclerotic heart disease of native coronary artery without angina pectoris: Secondary | ICD-10-CM | POA: Diagnosis not present

## 2017-06-18 DIAGNOSIS — E039 Hypothyroidism, unspecified: Secondary | ICD-10-CM | POA: Diagnosis not present

## 2017-06-18 DIAGNOSIS — Z950 Presence of cardiac pacemaker: Secondary | ICD-10-CM | POA: Diagnosis not present

## 2017-06-18 DIAGNOSIS — R278 Other lack of coordination: Secondary | ICD-10-CM | POA: Diagnosis not present

## 2017-06-18 DIAGNOSIS — M810 Age-related osteoporosis without current pathological fracture: Secondary | ICD-10-CM | POA: Diagnosis not present

## 2017-06-18 DIAGNOSIS — Z5181 Encounter for therapeutic drug level monitoring: Secondary | ICD-10-CM | POA: Diagnosis not present

## 2017-06-18 DIAGNOSIS — E1140 Type 2 diabetes mellitus with diabetic neuropathy, unspecified: Secondary | ICD-10-CM | POA: Diagnosis not present

## 2017-06-18 DIAGNOSIS — R2689 Other abnormalities of gait and mobility: Secondary | ICD-10-CM | POA: Diagnosis not present

## 2017-06-18 DIAGNOSIS — I10 Essential (primary) hypertension: Secondary | ICD-10-CM | POA: Diagnosis not present

## 2017-06-18 DIAGNOSIS — I4891 Unspecified atrial fibrillation: Secondary | ICD-10-CM | POA: Diagnosis not present

## 2017-07-06 DIAGNOSIS — E039 Hypothyroidism, unspecified: Secondary | ICD-10-CM | POA: Diagnosis not present

## 2017-07-06 DIAGNOSIS — R112 Nausea with vomiting, unspecified: Secondary | ICD-10-CM | POA: Diagnosis not present

## 2017-07-09 DIAGNOSIS — R112 Nausea with vomiting, unspecified: Secondary | ICD-10-CM | POA: Diagnosis not present

## 2017-07-09 DIAGNOSIS — E039 Hypothyroidism, unspecified: Secondary | ICD-10-CM | POA: Diagnosis not present

## 2017-07-10 DIAGNOSIS — I2510 Atherosclerotic heart disease of native coronary artery without angina pectoris: Secondary | ICD-10-CM | POA: Diagnosis not present

## 2017-07-10 DIAGNOSIS — I481 Persistent atrial fibrillation: Secondary | ICD-10-CM | POA: Diagnosis not present

## 2017-07-10 DIAGNOSIS — I5032 Chronic diastolic (congestive) heart failure: Secondary | ICD-10-CM | POA: Diagnosis not present

## 2017-07-10 DIAGNOSIS — H0289 Other specified disorders of eyelid: Secondary | ICD-10-CM | POA: Diagnosis not present

## 2017-07-13 DIAGNOSIS — I2510 Atherosclerotic heart disease of native coronary artery without angina pectoris: Secondary | ICD-10-CM | POA: Diagnosis not present

## 2017-07-13 DIAGNOSIS — I509 Heart failure, unspecified: Secondary | ICD-10-CM | POA: Diagnosis not present

## 2017-07-13 DIAGNOSIS — R251 Tremor, unspecified: Secondary | ICD-10-CM | POA: Diagnosis not present

## 2017-08-10 DIAGNOSIS — I509 Heart failure, unspecified: Secondary | ICD-10-CM | POA: Diagnosis not present

## 2017-08-10 DIAGNOSIS — E1149 Type 2 diabetes mellitus with other diabetic neurological complication: Secondary | ICD-10-CM | POA: Diagnosis not present

## 2017-08-10 DIAGNOSIS — I2510 Atherosclerotic heart disease of native coronary artery without angina pectoris: Secondary | ICD-10-CM | POA: Diagnosis not present

## 2017-08-16 DIAGNOSIS — I481 Persistent atrial fibrillation: Secondary | ICD-10-CM | POA: Diagnosis not present

## 2017-08-16 DIAGNOSIS — R600 Localized edema: Secondary | ICD-10-CM | POA: Diagnosis not present

## 2017-08-16 DIAGNOSIS — I5032 Chronic diastolic (congestive) heart failure: Secondary | ICD-10-CM | POA: Diagnosis not present

## 2017-08-16 DIAGNOSIS — E119 Type 2 diabetes mellitus without complications: Secondary | ICD-10-CM | POA: Diagnosis not present

## 2017-09-07 DIAGNOSIS — R600 Localized edema: Secondary | ICD-10-CM | POA: Diagnosis not present

## 2017-09-07 DIAGNOSIS — E46 Unspecified protein-calorie malnutrition: Secondary | ICD-10-CM | POA: Diagnosis not present

## 2017-09-07 DIAGNOSIS — I509 Heart failure, unspecified: Secondary | ICD-10-CM | POA: Diagnosis not present

## 2017-09-14 DIAGNOSIS — I739 Peripheral vascular disease, unspecified: Secondary | ICD-10-CM | POA: Diagnosis not present

## 2017-09-14 DIAGNOSIS — L84 Corns and callosities: Secondary | ICD-10-CM | POA: Diagnosis not present

## 2017-09-14 DIAGNOSIS — B351 Tinea unguium: Secondary | ICD-10-CM | POA: Diagnosis not present

## 2017-09-14 DIAGNOSIS — M79661 Pain in right lower leg: Secondary | ICD-10-CM | POA: Diagnosis not present

## 2017-09-14 DIAGNOSIS — Q845 Enlarged and hypertrophic nails: Secondary | ICD-10-CM | POA: Diagnosis not present

## 2017-09-14 DIAGNOSIS — L603 Nail dystrophy: Secondary | ICD-10-CM | POA: Diagnosis not present

## 2017-09-17 DIAGNOSIS — G4700 Insomnia, unspecified: Secondary | ICD-10-CM | POA: Diagnosis not present

## 2017-09-21 DIAGNOSIS — S8011XA Contusion of right lower leg, initial encounter: Secondary | ICD-10-CM | POA: Diagnosis not present

## 2017-09-21 DIAGNOSIS — M7989 Other specified soft tissue disorders: Secondary | ICD-10-CM | POA: Diagnosis not present

## 2017-09-21 DIAGNOSIS — M79662 Pain in left lower leg: Secondary | ICD-10-CM | POA: Diagnosis not present

## 2017-09-21 DIAGNOSIS — T148XXA Other injury of unspecified body region, initial encounter: Secondary | ICD-10-CM | POA: Diagnosis not present

## 2017-09-22 DIAGNOSIS — R05 Cough: Secondary | ICD-10-CM | POA: Diagnosis not present

## 2017-09-24 DIAGNOSIS — T148XXD Other injury of unspecified body region, subsequent encounter: Secondary | ICD-10-CM | POA: Diagnosis not present

## 2017-09-24 DIAGNOSIS — I5032 Chronic diastolic (congestive) heart failure: Secondary | ICD-10-CM | POA: Diagnosis not present

## 2017-09-24 DIAGNOSIS — J918 Pleural effusion in other conditions classified elsewhere: Secondary | ICD-10-CM | POA: Diagnosis not present

## 2017-09-24 DIAGNOSIS — J189 Pneumonia, unspecified organism: Secondary | ICD-10-CM | POA: Diagnosis not present

## 2017-09-26 DIAGNOSIS — G4700 Insomnia, unspecified: Secondary | ICD-10-CM | POA: Diagnosis not present

## 2017-10-05 DIAGNOSIS — I509 Heart failure, unspecified: Secondary | ICD-10-CM | POA: Diagnosis not present

## 2017-10-05 DIAGNOSIS — I2510 Atherosclerotic heart disease of native coronary artery without angina pectoris: Secondary | ICD-10-CM | POA: Diagnosis not present

## 2017-10-05 DIAGNOSIS — R251 Tremor, unspecified: Secondary | ICD-10-CM | POA: Diagnosis not present

## 2017-10-08 ENCOUNTER — Encounter (HOSPITAL_COMMUNITY): Payer: Self-pay | Admitting: Family Medicine

## 2017-10-08 ENCOUNTER — Inpatient Hospital Stay (HOSPITAL_COMMUNITY)
Admission: EM | Admit: 2017-10-08 | Discharge: 2017-10-12 | DRG: 377 | Disposition: A | Discharge status: Skilled Nursing Facility | Payer: Medicare Other | Attending: Internal Medicine | Admitting: Internal Medicine

## 2017-10-08 ENCOUNTER — Inpatient Hospital Stay (HOSPITAL_COMMUNITY): Payer: Medicare Other

## 2017-10-08 ENCOUNTER — Emergency Department (HOSPITAL_COMMUNITY): Payer: Medicare Other

## 2017-10-08 DIAGNOSIS — R1110 Vomiting, unspecified: Secondary | ICD-10-CM | POA: Diagnosis not present

## 2017-10-08 DIAGNOSIS — M2550 Pain in unspecified joint: Secondary | ICD-10-CM | POA: Diagnosis not present

## 2017-10-08 DIAGNOSIS — R791 Abnormal coagulation profile: Secondary | ICD-10-CM | POA: Diagnosis present

## 2017-10-08 DIAGNOSIS — E861 Hypovolemia: Secondary | ICD-10-CM | POA: Diagnosis present

## 2017-10-08 DIAGNOSIS — Z7401 Bed confinement status: Secondary | ICD-10-CM | POA: Diagnosis not present

## 2017-10-08 DIAGNOSIS — N179 Acute kidney failure, unspecified: Secondary | ICD-10-CM

## 2017-10-08 DIAGNOSIS — F411 Generalized anxiety disorder: Secondary | ICD-10-CM | POA: Diagnosis present

## 2017-10-08 DIAGNOSIS — K922 Gastrointestinal hemorrhage, unspecified: Secondary | ICD-10-CM

## 2017-10-08 DIAGNOSIS — N183 Chronic kidney disease, stage 3 (moderate): Secondary | ICD-10-CM | POA: Diagnosis not present

## 2017-10-08 DIAGNOSIS — R531 Weakness: Secondary | ICD-10-CM | POA: Diagnosis not present

## 2017-10-08 DIAGNOSIS — Z681 Body mass index (BMI) 19 or less, adult: Secondary | ICD-10-CM

## 2017-10-08 DIAGNOSIS — F418 Other specified anxiety disorders: Secondary | ICD-10-CM | POA: Diagnosis present

## 2017-10-08 DIAGNOSIS — I10 Essential (primary) hypertension: Secondary | ICD-10-CM | POA: Diagnosis not present

## 2017-10-08 DIAGNOSIS — Z950 Presence of cardiac pacemaker: Secondary | ICD-10-CM | POA: Diagnosis not present

## 2017-10-08 DIAGNOSIS — D696 Thrombocytopenia, unspecified: Secondary | ICD-10-CM | POA: Diagnosis present

## 2017-10-08 DIAGNOSIS — I4510 Unspecified right bundle-branch block: Secondary | ICD-10-CM | POA: Diagnosis not present

## 2017-10-08 DIAGNOSIS — I442 Atrioventricular block, complete: Secondary | ICD-10-CM | POA: Diagnosis not present

## 2017-10-08 DIAGNOSIS — G9341 Metabolic encephalopathy: Secondary | ICD-10-CM | POA: Diagnosis present

## 2017-10-08 DIAGNOSIS — I252 Old myocardial infarction: Secondary | ICD-10-CM

## 2017-10-08 DIAGNOSIS — E440 Moderate protein-calorie malnutrition: Secondary | ICD-10-CM | POA: Diagnosis not present

## 2017-10-08 DIAGNOSIS — E039 Hypothyroidism, unspecified: Secondary | ICD-10-CM | POA: Diagnosis present

## 2017-10-08 DIAGNOSIS — I482 Chronic atrial fibrillation: Secondary | ICD-10-CM | POA: Diagnosis not present

## 2017-10-08 DIAGNOSIS — E785 Hyperlipidemia, unspecified: Secondary | ICD-10-CM | POA: Diagnosis not present

## 2017-10-08 DIAGNOSIS — Z7901 Long term (current) use of anticoagulants: Secondary | ICD-10-CM | POA: Diagnosis not present

## 2017-10-08 DIAGNOSIS — I5042 Chronic combined systolic (congestive) and diastolic (congestive) heart failure: Secondary | ICD-10-CM | POA: Diagnosis present

## 2017-10-08 DIAGNOSIS — E0840 Diabetes mellitus due to underlying condition with diabetic neuropathy, unspecified: Secondary | ICD-10-CM | POA: Diagnosis not present

## 2017-10-08 DIAGNOSIS — Z794 Long term (current) use of insulin: Secondary | ICD-10-CM | POA: Diagnosis not present

## 2017-10-08 DIAGNOSIS — J90 Pleural effusion, not elsewhere classified: Secondary | ICD-10-CM | POA: Diagnosis not present

## 2017-10-08 DIAGNOSIS — I2510 Atherosclerotic heart disease of native coronary artery without angina pectoris: Secondary | ICD-10-CM | POA: Diagnosis present

## 2017-10-08 DIAGNOSIS — I5032 Chronic diastolic (congestive) heart failure: Secondary | ICD-10-CM | POA: Diagnosis not present

## 2017-10-08 DIAGNOSIS — N289 Disorder of kidney and ureter, unspecified: Secondary | ICD-10-CM | POA: Diagnosis not present

## 2017-10-08 DIAGNOSIS — I130 Hypertensive heart and chronic kidney disease with heart failure and stage 1 through stage 4 chronic kidney disease, or unspecified chronic kidney disease: Secondary | ICD-10-CM | POA: Diagnosis present

## 2017-10-08 DIAGNOSIS — D509 Iron deficiency anemia, unspecified: Secondary | ICD-10-CM | POA: Diagnosis not present

## 2017-10-08 DIAGNOSIS — D539 Nutritional anemia, unspecified: Secondary | ICD-10-CM | POA: Diagnosis present

## 2017-10-08 DIAGNOSIS — Z955 Presence of coronary angioplasty implant and graft: Secondary | ICD-10-CM

## 2017-10-08 DIAGNOSIS — R1111 Vomiting without nausea: Secondary | ICD-10-CM | POA: Diagnosis not present

## 2017-10-08 DIAGNOSIS — K625 Hemorrhage of anus and rectum: Secondary | ICD-10-CM | POA: Diagnosis not present

## 2017-10-08 DIAGNOSIS — K5900 Constipation, unspecified: Secondary | ICD-10-CM | POA: Diagnosis not present

## 2017-10-08 DIAGNOSIS — Z79899 Other long term (current) drug therapy: Secondary | ICD-10-CM

## 2017-10-08 DIAGNOSIS — R58 Hemorrhage, not elsewhere classified: Secondary | ICD-10-CM | POA: Diagnosis not present

## 2017-10-08 DIAGNOSIS — G9340 Encephalopathy, unspecified: Secondary | ICD-10-CM | POA: Diagnosis not present

## 2017-10-08 DIAGNOSIS — I4891 Unspecified atrial fibrillation: Secondary | ICD-10-CM | POA: Diagnosis present

## 2017-10-08 DIAGNOSIS — E1122 Type 2 diabetes mellitus with diabetic chronic kidney disease: Secondary | ICD-10-CM | POA: Diagnosis not present

## 2017-10-08 DIAGNOSIS — M810 Age-related osteoporosis without current pathological fracture: Secondary | ICD-10-CM | POA: Diagnosis present

## 2017-10-08 DIAGNOSIS — E872 Acidosis: Secondary | ICD-10-CM | POA: Diagnosis present

## 2017-10-08 DIAGNOSIS — I700 Atherosclerosis of aorta: Secondary | ICD-10-CM | POA: Diagnosis present

## 2017-10-08 DIAGNOSIS — F0390 Unspecified dementia without behavioral disturbance: Secondary | ICD-10-CM | POA: Diagnosis present

## 2017-10-08 DIAGNOSIS — K5731 Diverticulosis of large intestine without perforation or abscess with bleeding: Secondary | ICD-10-CM | POA: Diagnosis not present

## 2017-10-08 DIAGNOSIS — R4020 Unspecified coma: Secondary | ICD-10-CM | POA: Diagnosis not present

## 2017-10-08 DIAGNOSIS — I481 Persistent atrial fibrillation: Secondary | ICD-10-CM | POA: Diagnosis not present

## 2017-10-08 DIAGNOSIS — E876 Hypokalemia: Secondary | ICD-10-CM | POA: Diagnosis present

## 2017-10-08 DIAGNOSIS — R5381 Other malaise: Secondary | ICD-10-CM | POA: Diagnosis not present

## 2017-10-08 DIAGNOSIS — E1140 Type 2 diabetes mellitus with diabetic neuropathy, unspecified: Secondary | ICD-10-CM | POA: Diagnosis present

## 2017-10-08 DIAGNOSIS — K921 Melena: Secondary | ICD-10-CM | POA: Diagnosis not present

## 2017-10-08 LAB — COMPREHENSIVE METABOLIC PANEL
ALK PHOS: 87 U/L (ref 38–126)
ALT: 15 U/L (ref 14–54)
AST: 17 U/L (ref 15–41)
Albumin: 2.9 g/dL — ABNORMAL LOW (ref 3.5–5.0)
Anion gap: 12 (ref 5–15)
BUN: 63 mg/dL — AB (ref 6–20)
CALCIUM: 9.2 mg/dL (ref 8.9–10.3)
CHLORIDE: 100 mmol/L — AB (ref 101–111)
CO2: 29 mmol/L (ref 22–32)
Creatinine, Ser: 1.69 mg/dL — ABNORMAL HIGH (ref 0.44–1.00)
GFR calc Af Amer: 30 mL/min — ABNORMAL LOW (ref >60)
GFR, EST NON AFRICAN AMERICAN: 26 mL/min — AB (ref >60)
Glucose, Bld: 150 mg/dL — ABNORMAL HIGH (ref 65–99)
Potassium: 3.3 mmol/L — ABNORMAL LOW (ref 3.5–5.1)
Sodium: 141 mmol/L (ref 135–145)
Total Bilirubin: 1.3 mg/dL — ABNORMAL HIGH (ref 0.3–1.2)
Total Protein: 6 g/dL — ABNORMAL LOW (ref 6.5–8.1)

## 2017-10-08 LAB — I-STAT CHEM 8, ED
BUN: 65 mg/dL — ABNORMAL HIGH (ref 6–20)
CALCIUM ION: 1.12 mmol/L — AB (ref 1.15–1.40)
Chloride: 98 mmol/L — ABNORMAL LOW (ref 101–111)
Creatinine, Ser: 1.5 mg/dL — ABNORMAL HIGH (ref 0.44–1.00)
GLUCOSE: 143 mg/dL — AB (ref 65–99)
HCT: 36 % (ref 36.0–46.0)
HEMOGLOBIN: 12.2 g/dL (ref 12.0–15.0)
Potassium: 3.5 mmol/L (ref 3.5–5.1)
SODIUM: 140 mmol/L (ref 135–145)
TCO2: 29 mmol/L (ref 22–32)

## 2017-10-08 LAB — TYPE AND SCREEN
ABO/RH(D): O POS
ANTIBODY SCREEN: NEGATIVE

## 2017-10-08 LAB — PROTIME-INR
INR: 2.41
INR: 1.38
PROTHROMBIN TIME: 26 s — AB (ref 11.4–15.2)
Prothrombin Time: 16.9 seconds — ABNORMAL HIGH (ref 11.4–15.2)

## 2017-10-08 LAB — I-STAT CG4 LACTIC ACID, ED
LACTIC ACID, VENOUS: 2.12 mmol/L — AB (ref 0.5–1.9)
Lactic Acid, Venous: 1.55 mmol/L (ref 0.5–1.9)

## 2017-10-08 LAB — CBC
HCT: 35.4 % — ABNORMAL LOW (ref 36.0–46.0)
HEMATOCRIT: 33.4 % — AB (ref 36.0–46.0)
Hemoglobin: 11.3 g/dL — ABNORMAL LOW (ref 12.0–15.0)
Hemoglobin: 10.5 g/dL — ABNORMAL LOW (ref 12.0–15.0)
MCH: 32 pg (ref 26.0–34.0)
MCH: 31.6 pg (ref 26.0–34.0)
MCHC: 31.9 g/dL (ref 30.0–36.0)
MCHC: 31.4 g/dL (ref 30.0–36.0)
MCV: 100.3 fL — AB (ref 78.0–100.0)
MCV: 100.6 fL — AB (ref 78.0–100.0)
PLATELETS: 108 10*3/uL — AB (ref 150–400)
Platelets: 107 10*3/uL — ABNORMAL LOW (ref 150–400)
RBC: 3.53 MIL/uL — ABNORMAL LOW (ref 3.87–5.11)
RBC: 3.32 MIL/uL — AB (ref 3.87–5.11)
RDW: 16.7 % — AB (ref 11.5–15.5)
RDW: 16.8 % — ABNORMAL HIGH (ref 11.5–15.5)
WBC: 7.6 10*3/uL (ref 4.0–10.5)
WBC: 8.3 10*3/uL (ref 4.0–10.5)

## 2017-10-08 LAB — ABO/RH: ABO/RH(D): O POS

## 2017-10-08 LAB — TSH: TSH: 3.744 u[IU]/mL (ref 0.350–4.500)

## 2017-10-08 MED ORDER — DILTIAZEM HCL ER COATED BEADS 180 MG PO CP24
300.0000 mg | ORAL_CAPSULE | Freq: Every day | ORAL | Status: DC
  Administered 2017-10-10 – 2017-10-12 (×3): 300 mg via ORAL
  Filled 2017-10-08 (×4): qty 1

## 2017-10-08 MED ORDER — SODIUM CHLORIDE 0.9 % IV SOLN: 250.0000 mL | INTRAVENOUS | Status: DC | PRN

## 2017-10-08 MED ORDER — LEVOTHYROXINE SODIUM 100 MCG PO TABS
100.0000 ug | ORAL_TABLET | Freq: Every day | ORAL | Status: DC
  Administered 2017-10-10 – 2017-10-12 (×3): 100 ug via ORAL
  Filled 2017-10-08 (×4): qty 1

## 2017-10-08 MED ORDER — LORAZEPAM 2 MG/ML IJ SOLN
0.5000 mg | INTRAMUSCULAR | Status: DC | PRN
  Administered 2017-10-09: 0.5 mg via INTRAVENOUS
  Filled 2017-10-08: qty 1

## 2017-10-08 MED ORDER — POTASSIUM CHLORIDE 10 MEQ/100ML IV SOLN
10.0000 meq | INTRAVENOUS | Status: DC
  Administered 2017-10-08 (×2): 10 meq via INTRAVENOUS
  Filled 2017-10-08 (×3): qty 100

## 2017-10-08 MED ORDER — LEVALBUTEROL HCL 0.63 MG/3ML IN NEBU: 0.6300 mg | INHALATION_SOLUTION | Freq: Four times a day (QID) | RESPIRATORY_TRACT | Status: DC | PRN

## 2017-10-08 MED ORDER — ONDANSETRON HCL 4 MG PO TABS: 4.0000 mg | ORAL_TABLET | Freq: Four times a day (QID) | ORAL | Status: DC | PRN

## 2017-10-08 MED ORDER — METRONIDAZOLE IN NACL 5-0.79 MG/ML-% IV SOLN
500.0000 mg | Freq: Once | INTRAVENOUS | Status: DC
  Filled 2017-10-08: qty 100

## 2017-10-08 MED ORDER — SODIUM CHLORIDE 0.9% FLUSH
3.0000 mL | INTRAVENOUS | Status: DC | PRN
  Administered 2017-10-10: 3 mL via INTRAVENOUS
  Filled 2017-10-08: qty 3

## 2017-10-08 MED ORDER — CIPROFLOXACIN IN D5W 400 MG/200ML IV SOLN
400.0000 mg | Freq: Once | INTRAVENOUS | Status: DC
  Filled 2017-10-08: qty 200

## 2017-10-08 MED ORDER — ONDANSETRON HCL 4 MG/2ML IJ SOLN: 4.0000 mg | Freq: Four times a day (QID) | INTRAMUSCULAR | Status: DC | PRN

## 2017-10-08 MED ORDER — INSULIN ASPART 100 UNIT/ML ~~LOC~~ SOLN
0.0000 [IU] | SUBCUTANEOUS | Status: DC
  Administered 2017-10-09 (×2): 1 [IU] via SUBCUTANEOUS
  Administered 2017-10-10: 2 [IU] via SUBCUTANEOUS
  Administered 2017-10-10 – 2017-10-11 (×2): 1 [IU] via SUBCUTANEOUS
  Filled 2017-10-08: qty 1

## 2017-10-08 MED ORDER — PANTOPRAZOLE SODIUM 40 MG IV SOLR
40.0000 mg | Freq: Two times a day (BID) | INTRAVENOUS | Status: DC
  Administered 2017-10-08: 40 mg via INTRAVENOUS
  Filled 2017-10-08 (×2): qty 40

## 2017-10-08 MED ORDER — KETOTIFEN FUMARATE 0.025 % OP SOLN: 1.0000 [drp] | Freq: Two times a day (BID) | OPHTHALMIC | Status: DC | PRN

## 2017-10-08 MED ORDER — SODIUM CHLORIDE 0.9% FLUSH
3.0000 mL | Freq: Two times a day (BID) | INTRAVENOUS | Status: DC
Start: 2017-10-08 — End: 2017-10-12
  Administered 2017-10-09 – 2017-10-12 (×5): 3 mL via INTRAVENOUS

## 2017-10-08 MED ORDER — SODIUM CHLORIDE 0.9 % IV SOLN
INTRAVENOUS | Status: AC
  Administered 2017-10-08: via INTRAVENOUS

## 2017-10-08 MED ORDER — SODIUM CHLORIDE 0.9 % IV BOLUS
1000.0000 mL | Freq: Once | INTRAVENOUS | Status: AC
  Administered 2017-10-08: 1000 mL via INTRAVENOUS

## 2017-10-08 MED ORDER — MAGNESIUM SULFATE IN D5W 1-5 GM/100ML-% IV SOLN
1.0000 g | Freq: Once | INTRAVENOUS | Status: AC
  Administered 2017-10-08: 1 g via INTRAVENOUS
  Filled 2017-10-08: qty 100

## 2017-10-08 MED ORDER — PROTHROMBIN COMPLEX CONC HUMAN 500 UNITS IV KIT
1544.0000 [IU] | PACK | Freq: Once | Status: AC
  Administered 2017-10-08: 1544 [IU] via INTRAVENOUS
  Filled 2017-10-08: qty 1000

## 2017-10-08 MED ORDER — ATORVASTATIN CALCIUM 40 MG PO TABS
40.0000 mg | ORAL_TABLET | Freq: Every day | ORAL | Status: DC
  Administered 2017-10-09 – 2017-10-11 (×3): 40 mg via ORAL
  Filled 2017-10-08 (×4): qty 1

## 2017-10-08 MED ORDER — VITAMIN K1 10 MG/ML IJ SOLN
10.0000 mg | Freq: Once | INTRAMUSCULAR | Status: AC
  Administered 2017-10-08: 10 mg via INTRAVENOUS
  Filled 2017-10-08: qty 1

## 2017-10-08 MED ORDER — SODIUM CHLORIDE 0.9% FLUSH
3.0000 mL | Freq: Two times a day (BID) | INTRAVENOUS | Status: DC
Start: 2017-10-08 — End: 2017-10-12
  Administered 2017-10-09 – 2017-10-11 (×3): 3 mL via INTRAVENOUS

## 2017-10-08 NOTE — ED Triage Notes (Signed)
PT here from [facility] via [facility] for GI bleed and weakness.  On blood thinners.  Family states they were at bedside and changed her bloody diaper several times in the last few hours.  Family states pt has been lethargic throughout the day, just laying in bed all day, which is not her norm.   Vs 124/64, hr 60 - v-paced, cbg 158, 97% on 3L (per norm), rr 16.  Hx dementia.

## 2017-10-08 NOTE — ED Notes (Signed)
Linens changed, medium amount red thick stool.

## 2017-10-08 NOTE — ED Notes (Signed)
Pt to CT via stretcher. Family at bedside.

## 2017-10-08 NOTE — H&P (Signed)
History and Physical    [name] ZOX:[id number] DOB: 27-Sep-1926 DOA: 10/08/2017  PCP: [name]   Patient coming from: Nursing home   Chief Complaint: Lethargy, rectal bleeding   HPI: [name] is a 84 y.o. female with medical history significant for dementia, atrial fibrillation on warfarin, complete heart block with pacer, chronic combined systolic/diastolic CHF, hypothyroidism, and depression with anxiety, now presenting to the emergency department for evaluation of lethargy and rectal bleeding.  Patient is reportedly talkative at her baseline, but has been essentially nonverbal today.  She was noted to have red and maroon blood in her diaper and was sent to the ED for further evaluation.  ED Course: Upon arrival to the ED, patient is found to be afebrile, saturating well on room air, and with vitals otherwise stable.  EKG features sinus rhythm with PVCs, ventricular pacing, and RBBB.  CT of the abdomen and pelvis reveals a large right and small left pleural effusions, diffuse diverticulosis, but no acute inflammation.  Chemistry panel is notable for potassium 3.3 and BUN of 63 with creatinine of 1.69.  CBC features a macrocytic anemia with hemoglobin of 11.3, higher than prior, and chronic thrombocytopenia with platelets 107,000.  INR is elevated to 2.41 and lactic acid is 2.12.  Patient was treated with ciprofloxacin, Flagyl, Kcentra, vitamin K, 1 L of normal saline, and 10 mEq of IV potassium in the ED.  Gastroenterology was consulted by the ED physician.  She remains hemodynamically stable and will be admitted to the stepdown unit for ongoing evaluation and management of acute GI bleeding.  Review of Systems:  Unable to complete secondary to patient's clinical condition.  Past Medical History:  Diagnosis Date  . Anxiety   . Atrial fibrillation (HCC)   . Atrial fibrillation (HCC)   . CHF (congestive heart failure) (HCC)   . Constipation   .  Diverticulosis of colon   . DM (diabetes mellitus) (HCC)   . Hyperlipidemia   . Hypertension   . Hypothyroidism   . Osteoporosis   . Pain in left ear   . Thyroid disease   . Tremor, essential   . Type II or unspecified type diabetes mellitus with neurological manifestations, not stated as uncontrolled(250.60)     Past Surgical History:  Procedure Laterality Date  . CARDIAC CATHETERIZATION N/A 04/20/2016   Procedure: Left Heart Cath and Coronary Angiography;  Surgeon: Yvonne Kendall, MD;  Location: [facility] INVASIVE CV LAB;  Service: Cardiovascular;  Laterality: N/A;  . CARDIAC CATHETERIZATION N/A 04/24/2016   Procedure: Coronary Stent Intervention;  Surgeon: Lennette Bihari, MD;  Location: [facility] INVASIVE CV LAB;  Service: Cardiovascular;  Laterality: N/A;  . CARDIAC CATHETERIZATION N/A 04/24/2016   Procedure: Coronary Balloon Angioplasty;  Surgeon: Lennette Bihari, MD;  Location: [facility] INVASIVE CV LAB;  Service: Cardiovascular;  Laterality: N/A;  . CARDIAC CATHETERIZATION N/A 04/26/2016   Procedure: Coronary Stent Intervention;  Surgeon: Iran Ouch, MD;  Location: [facility] INVASIVE CV LAB;  Service: Cardiovascular;  Laterality: N/A;  . CATARACT EXTRACTION  2002  . IR THORACENTESIS ASP PLEURAL SPACE W/IMG GUIDE  06/07/2017  . L3 compression fraction    . PACEMAKER PLACEMENT    . TRANSTHORACIC ECHOCARDIOGRAM  2011, 2012     reports that she has never smoked. She has never used smokeless tobacco. She reports that she does not drink alcohol or use drugs.  Allergies  Allergen Reactions  . Codeine Sulfate Shortness Of Breath and Other (See Comments)  Felt funny  . Simvastatin Other (See Comments)     choking, acid reflux    Family History  Problem Relation Age of Onset  . Heart attack Father   . Colon cancer Neg Hx      Prior to Admission medications   Medication Sig Start Date End Date Taking? Authorizing Provider  atorvastatin (LIPITOR) 40 MG tablet Take 1 tablet (40 mg total) by mouth  daily at 6 PM. Patient taking differently: Take 40 mg by mouth every evening.  05/01/16  Yes Arty Baumgartner, NP  bisacodyl (DULCOLAX) 10 MG suppository Place 10 mg rectally daily as needed for severe constipation.   Yes [provider]  CARTIA XT 300 MG 24 hr capsule take 1 capsule by mouth once daily 03/09/16  Yes Gordy Savers, MD  docusate sodium (COLACE) 100 MG capsule Take 100 mg by mouth 2 (two) times daily as needed (constipation).   Yes [provider]  ferrous sulfate 325 (65 FE) MG tablet take 1 tablet by mouth once daily 03/31/16  Yes Gordy Savers, MD  furosemide (LASIX) 40 MG tablet take 1 tablet by mouth once daily Patient taking differently: take 1 tablet by mouth twice daily 06/12/16  Yes Gordy Savers, MD  hydrOXYzine (ATARAX/VISTARIL) 10 MG tablet Take 10 mg by mouth 2 (two) times daily. For anxiety/itching (8a and 4p)   Yes [provider]  hyoscyamine (LEVBID) 0.375 MG 12 hr tablet take 1 tablet by mouth twice a day ONLY IF NEEDED  **DO NOT TAKE EVERYDAY MUST LAST 30 DAYS** Patient taking differently: take 1 tablet by mouth twice daily as needed for diverticulosis 12/20/15  Yes Gordy Savers, MD  ketotifen (ZADITOR) 0.025 % ophthalmic solution Place 1 drop into both eyes 2 (two) times daily as needed (itching).   Yes [provider]  levalbuterol (XOPENEX) 0.63 MG/3ML nebulizer solution Take 0.63 mg by nebulization every 6 (six) hours as needed for wheezing or shortness of breath.   Yes [provider]  levothyroxine (SYNTHROID, LEVOTHROID) 100 MCG tablet Take 100 mcg by mouth daily before breakfast.   Yes [provider]  LORazepam (ATIVAN) 0.5 MG tablet Take 0.5 mg by mouth 2 (two) times daily as needed for anxiety.   Yes [provider]  Melatonin 3 MG TABS Take 6 mg by mouth at bedtime.   Yes [provider]  metFORMIN (GLUCOPHAGE) 500 MG tablet Take 500 mg by mouth daily with  breakfast.   Yes [provider]  methocarbamol (ROBAXIN) 500 MG tablet Take 500 mg by mouth 3 (three) times daily. For back pain   Yes [provider]  metolazone (ZAROXOLYN) 2.5 MG tablet Take 2.5 mg by mouth See admin instructions. Take 1 tablet (2.5 mg) by mouth daily on Monday and Thursday   Yes [provider]  morphine (MSIR) 15 MG tablet Take 1 tablet (15 mg total) by mouth every 4 (four) hours as needed for severe pain. 07/08/16  Yes Melene Plan, DO  Nutritional Supplements (NUTRITIONAL SUPPLEMENT PO) Take 120 mLs by mouth 3 (three) times daily. "Med Pass"   Yes [provider]  OXYGEN Inhale 2 L into the lungs as needed (shortness of breath).   Yes [provider]  polyethylene glycol powder (GLYCOLAX/MIRALAX) powder Take 17 g by mouth daily. Mix in liquid and drink   Yes [provider]  potassium chloride SA (K-DUR,KLOR-CON) 20 MEQ tablet Take 20 mEq by mouth daily.   Yes [provider]  senna-docusate (SENNA S) 8.6-50 MG tablet Take 1 tablet by mouth daily as needed (constipation).   Yes [provider]  sertraline (ZOLOFT) 100 MG tablet Take 100 mg by mouth daily.   Yes [provider]  traMADol (ULTRAM) 50 MG tablet Take 1 tablet (50 mg total) by mouth every 6 (six) hours as needed for moderate pain. 10/26/15  Yes Nelwyn Salisbury, MD  warfarin (COUMADIN) 2.5 MG tablet Take 2.5 mg by mouth every evening.   Yes [provider]    Physical Exam: Vitals:   10/08/17 2030 10/08/17 2045 10/08/17 2100 10/08/17 2115  BP: 111/61 (!) 103/47 124/62 (!) 121/54  Pulse: 71 69 (!) 38 (!) 37  Resp: 17 (!) 23 18 (!) 22  Temp:      TempSrc:      SpO2: 98% 98% 98% 98%      Constitutional: NAD, lethargic   Eyes: PERTLA, lids and conjunctivae normal ENMT: Mucous membranes are moist. Posterior pharynx clear of any exudate or lesions.   Neck: normal, supple, no masses, no thyromegaly Respiratory: clear to  auscultation bilaterally, no wheezing, no crackles. Normal respiratory effort.   Cardiovascular: S1 & S2 heard, regular rate and rhythm. No extremity edema. No significant JVD. Abdomen: No distension, no tenderness, soft. Bowel sounds active.  Musculoskeletal: no clubbing / cyanosis. No joint deformity upper and lower extremities. Soft nontender mass at lower right leg.  Skin: no significant rashes, lesions, ulcers. Warm, dry, well-perfused. Neurologic: Lethargic, not responding verbally. No gross facial asymmetry, PERRL. Patellar DTR normal.   Labs on Admission: I have personally reviewed following labs and imaging studies  CBC: Recent Labs  Lab 10/08/17 1632 10/08/17 1702 10/08/17 2120  WBC 7.6  --  8.3  HGB 11.3* 12.2 10.5*  HCT 35.4* 36.0 33.4*  MCV 100.3*  --  100.6*  PLT 107*  --  108*   Basic Metabolic Panel: Recent Labs  Lab 10/08/17 1632 10/08/17 1702  NA 141 140  K 3.3* 3.5  CL 100* 98*  CO2 29  --   GLUCOSE 150* 143*  BUN 63* 65*  CREATININE 1.69* 1.50*  CALCIUM 9.2  --    GFR: CrCl cannot be calculated (Unknown ideal weight.). Liver Function Tests: Recent Labs  Lab 10/08/17 1632  AST 17  ALT 15  ALKPHOS 87  BILITOT 1.3*  PROT 6.0*  ALBUMIN 2.9*   No results for input(s): LIPASE, AMYLASE in the last 168 hours. No results for input(s): AMMONIA in the last 168 hours. Coagulation Profile: Recent Labs  Lab 10/08/17 1632 10/08/17 2055  INR 2.41 1.38   Cardiac Enzymes: No results for input(s): CKTOTAL, CKMB, CKMBINDEX, TROPONINI in the last 168 hours. BNP (last 3 results) No results for input(s): PROBNP in the last 8760 hours. HbA1C: No results for input(s): HGBA1C in the last 72 hours. CBG: No results for input(s): GLUCAP in the last 168 hours. Lipid Profile: No results for input(s): CHOL, HDL, LDLCALC, TRIG, CHOLHDL, LDLDIRECT in the last 72 hours. Thyroid Function Tests: No results for input(s): TSH, T4TOTAL, FREET4, T3FREE, THYROIDAB in the  last 72 hours. Anemia Panel: No results for input(s): VITAMINB12, FOLATE, FERRITIN, TIBC, IRON, RETICCTPCT in the last 72 hours. Urine analysis:    Component Value Date/Time   COLORURINE YELLOW 07/16/2016 1059   APPEARANCEUR CLEAR 07/16/2016 1059   LABSPEC 1.006 07/16/2016 1059   PHURINE 8.0 07/16/2016 1059   GLUCOSEU NEGATIVE 07/16/2016 1059   GLUCOSEU NEGATIVE 04/04/2006 1156  HGBUR NEGATIVE 07/16/2016 1059   HGBUR negative 11/03/2009 1400   BILIRUBINUR NEGATIVE 07/16/2016 1059   BILIRUBINUR n 05/24/2016 1250   KETONESUR NEGATIVE 07/16/2016 1059   PROTEINUR NEGATIVE 07/16/2016 1059   UROBILINOGEN 1.0 05/24/2016 1250   UROBILINOGEN 0.2 03/19/2014 1148   NITRITE NEGATIVE 07/16/2016 1059   LEUKOCYTESUR SMALL (A) 07/16/2016 1059   Sepsis Labs: (procalcitonin:4,lacticidven:4) )No results found for this or any previous visit (from the past 240 hour(s)).   Radiological Exams on Admission: Ct Abdomen Pelvis Wo Contrast  Result Date: 10/08/2017 CLINICAL DATA:  Abdominal pain with nausea and vomiting EXAM: CT ABDOMEN AND PELVIS WITHOUT CONTRAST TECHNIQUE: Multidetector CT imaging of the abdomen and pelvis was performed following the standard protocol without IV contrast. COMPARISON:  CT abdomen pelvis 07/08/2016 FINDINGS: LOWER CHEST: Large right and small left pleural effusions with associated atelectasis. HEPATOBILIARY: Normal hepatic contours and density. No intra- or extrahepatic biliary dilatation. Normal gallbladder. PANCREAS: Normal parenchymal contours without ductal dilatation. No peripancreatic fluid collection. SPLEEN: Normal. ADRENALS/URINARY TRACT: --Adrenal glands: Normal. --Right kidney/ureter: Right renal cyst now measures up to 4.4 cm, previously 3.4 cm. There are small calcifications at the renal pelvis, probably vascular. --Left kidney/ureter: Unchanged 1.8 cm left renal cyst. No hydronephrosis. --Urinary bladder: Normal for degree of distention STOMACH/BOWEL:  --Stomach/Duodenum: No hiatal hernia or other gastric abnormality. Normal duodenal course. --Small bowel: No dilatation or inflammation. --Colon: There is diffuse colonic diverticulosis without acute inflammation. Moderate volume of stool in distal colon. --Appendix: Not visualized. No right lower quadrant inflammation or free fluid. VASCULAR/LYMPHATIC: Atherosclerotic calcification is present within the non-aneurysmal abdominal aorta, without hemodynamically significant stenosis. no abdominal or pelvic lymphadenopathy. REPRODUCTIVE: Partially calcified uterine fibroid.  No adnexal mass. MUSCULOSKELETAL. Multilevel degenerative disc disease and facet arthrosis. No bony spinal canal stenosis. Unchanged L3 superior endplate Schmorl's node. OTHER: None. IMPRESSION: 1. Large right and small left pleural effusion. 2. Diffuse diverticulosis without acute inflammation. 3.  Aortic Atherosclerosis (ICD10-I70.0). Electronically Signed   By: Deatra Robinson M.D.   On: 10/08/2017 20:20    EKG: Independently reviewed. Sinus rhythm, PVC's, paced, RBBB.   Assessment/Plan  1. Acute GI bleeding  - Presents with lethargy and rectal bleeding  - Red and maroon blood in stool observed in ED  - No tachycardia or hypotension; H&H higher than priors  - She is anticoagulated with warfarin for AF  - She was treated with Kcentra and vit K in ED  - GI is consulting and much appreciated  - Likely lower, but with marked elevation in BUN a brisk UGIB is considered  - Hold warfarin, type and screen done, follow serial H&H, start PPI BID for now   2. Acute encephalopathy   - Hx of dementia, but is reportedly talkative at baseline, non-verbal in ED  - Head CT is ordered and pending  - UA ordered and pending  - Check TSH given hx of thyroid disease, check ammonia given slight hyperbilirubinemia, check B12 and folate given macrocytosis    3. Renal insufficiency  - SCr is 1.69 on admission, up from 1.03 more than a yr ago  -  Likely acute in light of GIB - Treated in ED with 1 liter NS and continued on gentle NS infusion  - Renally-dose medications, avoid nephrotoxins, repeat chem panel in am    4. Atrial fibrillation; complete heart block  - Paced rhythm on admission, CHADS-VASc is 87 (age x2, gender, CHF, CAD, DM)  - Warfarin reversed in ED with Kcentra and vit K  -  Continue cardiac monitoring, continue diltiazem, continue to hold warfarin in light of GIB    5. Type II DM  - A1c was 6.6% in 2017 and she is managed with metformin, held on admission  - Check CBG's and use low-intensity SSI with Novolog as needed    6. Chronic combined CHF  - Appears well-compensated  - Given 1 liter NS in ED and continued on gentle NS infusion while NPO with GIB  - Hold diuretics initially, follow daily wt and I/O's    7. Hypothyroidism   - Check TSH given AMS, continue Synthroid    8. Depression with anxiety  - Use Ativan IV prn for now    9. Pleural effusions  - Lg left and sm right pleural effusions noted incidentally on CT abd/pelvis  - There is no apparent dyspnea  - Consider diagnostic thoracentesis    DVT prophylaxis: SCD's; warfarin prior to arrival  Code Status: Full  Family Communication: Discussed with patient Consults called: [facility]  Admission status: Inpatient    Briscoe Deutscher, MD [facility] Pager [phone]  If 7PM-7AM, please contact night-coverage [email or web address] Password [email or web address]  10/08/2017, 10:06 PM

## 2017-10-08 NOTE — ED Notes (Signed)
Pt in CT at this time.

## 2017-10-08 NOTE — ED Provider Notes (Signed)
[facility] EMERGENCY DEPARTMENT Provider Note   CSN: [id number] Arrival date & time: 10/08/17  1624     History   Chief Complaint Chief Complaint  Patient presents with  . Rectal Bleeding  . Weakness    HPI [name] is a 84 y.o. female.  HPI   84 year old female with past medical history as below including A. fib, CHF, hypertension, diabetes, here with GI bleed.  History provided by family due to patient encephalopathy.  Per report, patient has been increasingly confused and altered throughout the day today.  She began having grossly bloody, bright red bowel movements this afternoon.  She has been increasingly drowsy and only intermittently responsive.  This is new for her.  She has a history of diverticulitis but family does not recall if she is ever had a GI bleed in the past.  They do not recall when her last colonoscopy was.  On my assessment, patient confused and unable to answer questions appropriately.  Level 5 caveat invoked as remainder of history, ROS, and physical exam limited due to patient's confusion/dementia.   Past Medical History:  Diagnosis Date  . Anxiety   . Atrial fibrillation (HCC)   . Atrial fibrillation (HCC)   . CHF (congestive heart failure) (HCC)   . Constipation   . Diverticulosis of colon   . DM (diabetes mellitus) (HCC)   . Hyperlipidemia   . Hypertension   . Hypothyroidism   . Osteoporosis   . Pain in left ear   . Thyroid disease   . Tremor, essential   . Type II or unspecified type diabetes mellitus with neurological manifestations, not stated as uncontrolled(250.60)     Patient Active Problem List   Diagnosis Date Noted  . Acute GI bleeding 10/08/2017  . Pleural effusion 10/08/2017  . CHB (complete heart block) (HCC) post AVN ablation 04/22/2016  . PAH (pulmonary artery hypertension) (HCC) 04/22/2016  . Encounter for therapeutic drug monitoring 09/04/2013  . Long term current use of anticoagulant therapy  01/06/2013  . Renal insufficiency 09/12/2010  . Hearing loss 11/08/2009  . PACEMAKER, PERMANENT 10/13/2008  . OSTEOPOROSIS 07/05/2007  . Dyslipidemia 05/03/2007  . TREMOR, ESSENTIAL 02/26/2007  . Chronic combined systolic and diastolic CHF (congestive heart failure) (HCC) 01/26/2007  . Hypothyroidism 01/02/2007  . Diabetes mellitus with neuropathy (HCC) 01/02/2007  . Anxiety state 01/02/2007  . Essential hypertension 01/02/2007  . ATRIAL FIBRILLATION 01/02/2007  . DIVERTICULOSIS, COLON 01/02/2007    Past Surgical History:  Procedure Laterality Date  . CARDIAC CATHETERIZATION N/A 04/20/2016   Procedure: Left Heart Cath and Coronary Angiography;  Surgeon: Yvonne Kendall, MD;  Location: [facility] INVASIVE CV LAB;  Service: Cardiovascular;  Laterality: N/A;  . CARDIAC CATHETERIZATION N/A 04/24/2016   Procedure: Coronary Stent Intervention;  Surgeon: Lennette Bihari, MD;  Location: [facility] INVASIVE CV LAB;  Service: Cardiovascular;  Laterality: N/A;  . CARDIAC CATHETERIZATION N/A 04/24/2016   Procedure: Coronary Balloon Angioplasty;  Surgeon: Lennette Bihari, MD;  Location: [facility] INVASIVE CV LAB;  Service: Cardiovascular;  Laterality: N/A;  . CARDIAC CATHETERIZATION N/A 04/26/2016   Procedure: Coronary Stent Intervention;  Surgeon: Iran Ouch, MD;  Location: [facility] INVASIVE CV LAB;  Service: Cardiovascular;  Laterality: N/A;  . CATARACT EXTRACTION  2002  . IR THORACENTESIS ASP PLEURAL SPACE W/IMG GUIDE  06/07/2017  . L3 compression fraction    . PACEMAKER PLACEMENT    . TRANSTHORACIC ECHOCARDIOGRAM  2011, 2012     OB History   None  Home Medications    Prior to Admission medications   Medication Sig Start Date End Date Taking? Authorizing Provider  atorvastatin (LIPITOR) 40 MG tablet Take 1 tablet (40 mg total) by mouth daily at 6 PM. Patient taking differently: Take 40 mg by mouth every evening.  05/01/16  Yes Arty Baumgartner, NP  bisacodyl (DULCOLAX) 10 MG suppository Place 10 mg  rectally daily as needed for severe constipation.   Yes [provider]  CARTIA XT 300 MG 24 hr capsule take 1 capsule by mouth once daily 03/09/16  Yes Gordy Savers, MD  docusate sodium (COLACE) 100 MG capsule Take 100 mg by mouth 2 (two) times daily as needed (constipation).   Yes [provider]  ferrous sulfate 325 (65 FE) MG tablet take 1 tablet by mouth once daily 03/31/16  Yes Gordy Savers, MD  furosemide (LASIX) 40 MG tablet take 1 tablet by mouth once daily Patient taking differently: take 1 tablet by mouth twice daily 06/12/16  Yes Gordy Savers, MD  hydrOXYzine (ATARAX/VISTARIL) 10 MG tablet Take 10 mg by mouth 2 (two) times daily. For anxiety/itching (8a and 4p)   Yes [provider]  hyoscyamine (LEVBID) 0.375 MG 12 hr tablet take 1 tablet by mouth twice a day ONLY IF NEEDED  **DO NOT TAKE EVERYDAY MUST LAST 30 DAYS** Patient taking differently: take 1 tablet by mouth twice daily as needed for diverticulosis 12/20/15  Yes Gordy Savers, MD  ketotifen (ZADITOR) 0.025 % ophthalmic solution Place 1 drop into both eyes 2 (two) times daily as needed (itching).   Yes [provider]  levalbuterol (XOPENEX) 0.63 MG/3ML nebulizer solution Take 0.63 mg by nebulization every 6 (six) hours as needed for wheezing or shortness of breath.   Yes [provider]  levothyroxine (SYNTHROID, LEVOTHROID) 100 MCG tablet Take 100 mcg by mouth daily before breakfast.   Yes [provider]  LORazepam (ATIVAN) 0.5 MG tablet Take 0.5 mg by mouth 2 (two) times daily as needed for anxiety.   Yes [provider]  Melatonin 3 MG TABS Take 6 mg by mouth at bedtime.   Yes [provider]  metFORMIN (GLUCOPHAGE) 500 MG tablet Take 500 mg by mouth daily with breakfast.   Yes [provider]  methocarbamol (ROBAXIN) 500 MG tablet Take 500 mg by mouth 3 (three) times daily. For back pain   Yes [provider]  metolazone (ZAROXOLYN) 2.5 MG tablet Take 2.5 mg by mouth See admin instructions. Take 1 tablet (2.5 mg) by mouth daily on Monday and Thursday   Yes [provider]  morphine (MSIR) 15 MG tablet Take 1 tablet (15 mg total) by mouth every 4 (four) hours as needed for severe pain. 07/08/16  Yes Melene Plan, DO  Nutritional Supplements (NUTRITIONAL SUPPLEMENT PO) Take 120 mLs by mouth 3 (three) times daily. "Med Pass"   Yes [provider]  OXYGEN Inhale 2 L into the lungs as needed (shortness of breath).   Yes [provider]  polyethylene glycol powder (GLYCOLAX/MIRALAX) powder Take 17 g by mouth daily. Mix in liquid and drink   Yes [provider]  potassium chloride SA (K-DUR,KLOR-CON) 20 MEQ tablet Take 20 mEq by mouth daily.   Yes [provider]  senna-docusate (SENNA S) 8.6-50 MG tablet Take 1 tablet by mouth daily as needed (constipation).   Yes [provider]  sertraline (ZOLOFT) 100 MG tablet Take 100 mg by mouth daily.  Yes [provider]  traMADol (ULTRAM) 50 MG tablet Take 1 tablet (50 mg total) by mouth every 6 (six) hours as needed for moderate pain. 10/26/15  Yes Nelwyn Salisbury, MD  warfarin (COUMADIN) 2.5 MG tablet Take 2.5 mg by mouth every evening.   Yes [provider]    Family History Family History  Problem Relation Age of Onset  . Heart attack Father   . Colon cancer Neg Hx     Social History Social History   Tobacco Use  . Smoking status: Never Smoker  . Smokeless tobacco: Never Used  Substance Use Topics  . Alcohol use: No    Alcohol/week: 0.0 oz  . Drug use: No     Allergies   Codeine sulfate and Simvastatin   Review of Systems Review of Systems  Unable to perform ROS: Mental status change  Constitutional: Positive for fatigue.  Neurological: Positive for weakness.     Physical Exam Updated Vital Signs BP (!) 113/58   Pulse 70   Temp 98.6 F (37 C) (Oral)   Resp 17    SpO2 97%   Physical Exam  Constitutional: She appears well-developed and well-nourished. She appears lethargic. She has a sickly appearance. She appears ill. No distress.  HENT:  Head: Normocephalic and atraumatic.  Eyes: Conjunctivae are normal.  Neck: Neck supple.  Cardiovascular: Normal rate, regular rhythm and normal heart sounds. Exam reveals no friction rub.  No murmur heard. Pulmonary/Chest: Effort normal and breath sounds normal. No respiratory distress. She has no wheezes. She has no rales.  Abdominal: She exhibits no distension. There is tenderness in the suprapubic area and left lower quadrant.  Genitourinary:  Genitourinary Comments: Grossly bloody, maroon-colored stool  Musculoskeletal: She exhibits no edema.  Neurological: She appears lethargic. She exhibits normal muscle tone.  Skin: Skin is warm. Capillary refill takes less than 2 seconds.  Psychiatric: She has a normal mood and affect.  Nursing note and vitals reviewed.    ED Treatments / Results  Labs (all labs ordered are listed, but only abnormal results are displayed) Labs Reviewed  COMPREHENSIVE METABOLIC PANEL - Abnormal; Notable for the following components:      Result Value   Potassium 3.3 (*)    Chloride 100 (*)    Glucose, Bld 150 (*)    BUN 63 (*)    Creatinine, Ser 1.69 (*)    Total Protein 6.0 (*)    Albumin 2.9 (*)    Total Bilirubin 1.3 (*)    GFR calc non Af Amer 26 (*)    GFR calc Af Amer 30 (*)    All other components within normal limits  CBC - Abnormal; Notable for the following components:   RBC 3.53 (*)    Hemoglobin 11.3 (*)    HCT 35.4 (*)    MCV 100.3 (*)    RDW 16.7 (*)    Platelets 107 (*)    All other components within normal limits  PROTIME-INR - Abnormal; Notable for the following components:   Prothrombin Time 26.0 (*)    All other components within normal limits  PROTIME-INR - Abnormal; Notable for the following components:   Prothrombin Time 16.9 (*)    All other  components within normal limits  CBC - Abnormal; Notable for the following components:   RBC 3.32 (*)    Hemoglobin 10.5 (*)    HCT 33.4 (*)    MCV 100.6 (*)    RDW 16.8 (*)  Platelets 108 (*)    All other components within normal limits  I-STAT CHEM 8, ED - Abnormal; Notable for the following components:   Chloride 98 (*)    BUN 65 (*)    Creatinine, Ser 1.50 (*)    Glucose, Bld 143 (*)    Calcium, Ion 1.12 (*)    All other components within normal limits  I-STAT CG4 LACTIC ACID, ED - Abnormal; Notable for the following components:   Lactic Acid, Venous 2.12 (*)    All other components within normal limits  CBG MONITORING, ED - Abnormal; Notable for the following components:   Glucose-Capillary 137 (*)    All other components within normal limits  URINE CULTURE  URINALYSIS, COMPLETE (UACMP) WITH MICROSCOPIC  TSH  BASIC METABOLIC PANEL  HEMOGLOBIN  HEMOGLOBIN  HEMATOCRIT  HEMATOCRIT  MAGNESIUM  PROTIME-INR  AMMONIA  VITAMIN B12  FOLATE RBC  HEMOGLOBIN  HEMATOCRIT  I-STAT CG4 LACTIC ACID, ED  TYPE AND SCREEN  ABO/RH    EKG EKG Interpretation  Date/Time:  Monday Oct 08 2017 16:42:52 EDT Ventricular Rate:  81 PR Interval:    QRS Duration: 128 QT Interval:  486 QTC Calculation: 564 R Axis:   146 Text Interpretation:   Suspect arm lead reversal, interpretation assumes no reversal Wide QRS rhythm with frequent ventricular-paced complexes and Premature supraventricular complexes in a pattern of bigeminy Right bundle branch block Anterolateral infarct , age undetermined Marked T wave abnormality, consider inferior ischemia Abnormal ECG Since last EKG, pt now in bigeminy Confirmed by Shaune Pollack [phone]) on 10/08/2017 5:04:53 PM   Radiology Ct Abdomen Pelvis Wo Contrast  Result Date: 10/08/2017 CLINICAL DATA:  Abdominal pain with nausea and vomiting EXAM: CT ABDOMEN AND PELVIS WITHOUT CONTRAST TECHNIQUE: Multidetector CT imaging of the abdomen and pelvis was  performed following the standard protocol without IV contrast. COMPARISON:  CT abdomen pelvis 07/08/2016 FINDINGS: LOWER CHEST: Large right and small left pleural effusions with associated atelectasis. HEPATOBILIARY: Normal hepatic contours and density. No intra- or extrahepatic biliary dilatation. Normal gallbladder. PANCREAS: Normal parenchymal contours without ductal dilatation. No peripancreatic fluid collection. SPLEEN: Normal. ADRENALS/URINARY TRACT: --Adrenal glands: Normal. --Right kidney/ureter: Right renal cyst now measures up to 4.4 cm, previously 3.4 cm. There are small calcifications at the renal pelvis, probably vascular. --Left kidney/ureter: Unchanged 1.8 cm left renal cyst. No hydronephrosis. --Urinary bladder: Normal for degree of distention STOMACH/BOWEL: --Stomach/Duodenum: No hiatal hernia or other gastric abnormality. Normal duodenal course. --Small bowel: No dilatation or inflammation. --Colon: There is diffuse colonic diverticulosis without acute inflammation. Moderate volume of stool in distal colon. --Appendix: Not visualized. No right lower quadrant inflammation or free fluid. VASCULAR/LYMPHATIC: Atherosclerotic calcification is present within the non-aneurysmal abdominal aorta, without hemodynamically significant stenosis. no abdominal or pelvic lymphadenopathy. REPRODUCTIVE: Partially calcified uterine fibroid.  No adnexal mass. MUSCULOSKELETAL. Multilevel degenerative disc disease and facet arthrosis. No bony spinal canal stenosis. Unchanged L3 superior endplate Schmorl's node. OTHER: None. IMPRESSION: 1. Large right and small left pleural effusion. 2. Diffuse diverticulosis without acute inflammation. 3.  Aortic Atherosclerosis (ICD10-I70.0). Electronically Signed   By: Deatra Robinson M.D.   On: 10/08/2017 20:20   Ct Head Wo Contrast  Result Date: 10/08/2017 CLINICAL DATA:  Altered level of consciousness EXAM: CT HEAD WITHOUT CONTRAST TECHNIQUE: Contiguous axial images were obtained  from the base of the skull through the vertex without intravenous contrast. COMPARISON:  07/06/2016 FINDINGS: BRAIN: There is sulcal and ventricular prominence consistent with superficial and central atrophy. No intraparenchymal hemorrhage, mass  effect nor midline shift. Periventricular and subcortical white matter hypodensities consistent with chronic small vessel ischemic disease are identified. No acute large vascular territory infarcts. No abnormal extra-axial fluid collections. Basal cisterns are not effaced and midline. VASCULAR: Moderate calcific atherosclerosis of the carotid siphons. SKULL: No skull fracture. No significant scalp soft tissue swelling. SINUSES/ORBITS: The mastoid air-cells are clear. The included paranasal sinuses are well-aerated.The included ocular globes and orbital contents are non-suspicious. OTHER: None. IMPRESSION: Atrophy with chronic small vessel ischemia. No acute intracranial abnormality. Electronically Signed   By: Tollie Eth M.D.   On: 10/08/2017 23:55    Procedures .Critical Care Performed by: Shaune Pollack, MD Authorized by: Shaune Pollack, MD   Critical care provider statement:    Critical care time (minutes):  55   Critical care time was exclusive of:  Separately billable procedures and treating other patients and teaching time   Critical care was necessary to treat or prevent imminent or life-threatening deterioration of the following conditions:  Circulatory failure and cardiac failure   Critical care was time spent personally by me on the following activities:  Development of treatment plan with patient or surrogate, discussions with consultants, evaluation of patient's response to treatment, examination of patient, obtaining history from patient or surrogate, ordering and performing treatments and interventions, ordering and review of laboratory studies, ordering and review of radiographic studies, pulse oximetry, re-evaluation of patient's condition and  review of old charts   I assumed direction of critical care for this patient from another provider in my specialty: no     (including critical care time)  Medications Ordered in ED Medications  atorvastatin (LIPITOR) tablet 40 mg (has no administration in time range)  diltiazem (CARDIZEM CD) 24 hr capsule 300 mg (has no administration in time range)  ketotifen (ZADITOR) 0.025 % ophthalmic solution 1 drop (has no administration in time range)  levalbuterol (XOPENEX) nebulizer solution 0.63 mg (has no administration in time range)  levothyroxine (SYNTHROID, LEVOTHROID) tablet 100 mcg (has no administration in time range)  sodium chloride flush (NS) 0.9 % injection 3 mL (3 mLs Intravenous Given 10/09/17 0124)  sodium chloride flush (NS) 0.9 % injection 3 mL (3 mLs Intravenous Given 10/09/17 0123)  sodium chloride flush (NS) 0.9 % injection 3 mL (has no administration in time range)  0.9 %  sodium chloride infusion (has no administration in time range)  ondansetron (ZOFRAN) tablet 4 mg (has no administration in time range)    Or  ondansetron (ZOFRAN) injection 4 mg (has no administration in time range)  0.9 %  sodium chloride infusion ( Intravenous New Bag/Given 10/08/17 2348)  pantoprazole (PROTONIX) injection 40 mg (40 mg Intravenous Given 10/08/17 2348)  LORazepam (ATIVAN) injection 0.5 mg (has no administration in time range)  insulin aspart (novoLOG) injection 0-9 Units (has no administration in time range)  sodium chloride 0.9 % bolus 1,000 mL (0 mLs Intravenous Stopped 10/08/17 2058)  prothrombin complex conc human (KCENTRA) IVPB 1,544 Units (0 Units Intravenous Stopped 10/08/17 1945)  phytonadione (VITAMIN K) 10 mg in dextrose 5 % 50 mL IVPB (0 mg Intravenous Stopped 10/08/17 1945)  magnesium sulfate IVPB 1 g 100 mL (0 g Intravenous Stopped 10/09/17 0059)     Initial Impression / Assessment and Plan / ED Course  I have reviewed the triage vital signs and the nursing notes.  Pertinent  labs & imaging results that were available during my care of the patient were reviewed by me and considered in my medical decision making (  see chart for details).  Clinical Course as of Oct 09 157  Mon Oct 08, 2017  133 84 year old female here with bright red blood per rectum in the setting of Coumadin use.  Suspect possible diverticular bleed.  Less likely ischemic bleed given her therapeutic INR.  Discussed with pharmacy, will give Kcentra, type and screen, and obtain CT angios.  Creatinine and BUN both elevated.  No known history of gastric ulcers.  Suspect this is prerenal due to dehydration from bleed.  IV fluids given.   [CI]    Clinical Course User Index [CI] Shaune Pollack, MD    CT scan shows no acute intra-abd pathology - pt has diffuse diverticulosis which is likely cause of bleed. Of note, large pleural effusions noted - h/o recent PNA. No hypoxia here. Discussed with Dr. Russella Dar, [facility] - will see pt in AM. Pt improving mentally with fluids. Admit to medicine. Family updated. Pt given fluids, cipro/flagyl given her initial clinical instability and concern for diverticulitis with lactic acidosis, and KCentra for reversal of coumadin in setting of active, brisk GI bleed.  Final Clinical Impressions(s) / ED Diagnoses   Final diagnoses:  Lower GI bleed  Encephalopathy  AKI (acute kidney injury) [facility])    ED Discharge Orders    None       Shaune Pollack, MD 10/09/17 [phone]

## 2017-10-09 ENCOUNTER — Encounter (HOSPITAL_COMMUNITY): Payer: Self-pay | Admitting: Physician Assistant

## 2017-10-09 ENCOUNTER — Other Ambulatory Visit: Payer: Self-pay

## 2017-10-09 DIAGNOSIS — Z7901 Long term (current) use of anticoagulants: Secondary | ICD-10-CM

## 2017-10-09 DIAGNOSIS — D509 Iron deficiency anemia, unspecified: Secondary | ICD-10-CM

## 2017-10-09 LAB — URINALYSIS, COMPLETE (UACMP) WITH MICROSCOPIC
BILIRUBIN URINE: NEGATIVE
Bacteria, UA: NONE SEEN
GLUCOSE, UA: NEGATIVE mg/dL
HGB URINE DIPSTICK: NEGATIVE
Ketones, ur: NEGATIVE mg/dL
LEUKOCYTES UA: NEGATIVE
NITRITE: NEGATIVE
PH: 7 (ref 5.0–8.0)
PROTEIN: NEGATIVE mg/dL
Specific Gravity, Urine: 1.01 (ref 1.005–1.030)

## 2017-10-09 LAB — BASIC METABOLIC PANEL
Anion gap: 10 (ref 5–15)
BUN: 58 mg/dL — AB (ref 6–20)
CO2: 26 mmol/L (ref 22–32)
CREATININE: 1.42 mg/dL — AB (ref 0.44–1.00)
Calcium: 8.6 mg/dL — ABNORMAL LOW (ref 8.9–10.3)
Chloride: 106 mmol/L (ref 101–111)
GFR calc Af Amer: 37 mL/min — ABNORMAL LOW (ref >60)
GFR, EST NON AFRICAN AMERICAN: 32 mL/min — AB (ref >60)
GLUCOSE: 104 mg/dL — AB (ref 65–99)
POTASSIUM: 2.7 mmol/L — AB (ref 3.5–5.1)
Sodium: 142 mmol/L (ref 135–145)

## 2017-10-09 LAB — CBG MONITORING, ED
GLUCOSE-CAPILLARY: 97 mg/dL (ref 65–99)
GLUCOSE-CAPILLARY: 111 mg/dL — AB (ref 65–99)
Glucose-Capillary: 137 mg/dL — ABNORMAL HIGH (ref 65–99)
Glucose-Capillary: 78 mg/dL (ref 65–99)
Glucose-Capillary: 94 mg/dL (ref 65–99)

## 2017-10-09 LAB — HEMOGLOBIN AND HEMATOCRIT, BLOOD
HCT: 33.1 % — ABNORMAL LOW (ref 36.0–46.0)
Hemoglobin: 10.3 g/dL — ABNORMAL LOW (ref 12.0–15.0)

## 2017-10-09 LAB — PROTIME-INR
INR: 1.24
Prothrombin Time: 15.5 seconds — ABNORMAL HIGH (ref 11.4–15.2)

## 2017-10-09 LAB — VITAMIN B12: Vitamin B-12: 643 pg/mL (ref 180–914)

## 2017-10-09 LAB — MAGNESIUM: Magnesium: 2.4 mg/dL (ref 1.7–2.4)

## 2017-10-09 LAB — MRSA PCR SCREENING: MRSA BY PCR: NEGATIVE

## 2017-10-09 LAB — GLUCOSE, CAPILLARY
GLUCOSE-CAPILLARY: 97 mg/dL (ref 65–99)
GLUCOSE-CAPILLARY: 134 mg/dL — AB (ref 65–99)

## 2017-10-09 MED ORDER — POTASSIUM CHLORIDE 10 MEQ/100ML IV SOLN
10.0000 meq | INTRAVENOUS | Status: AC
  Administered 2017-10-09 (×4): 10 meq via INTRAVENOUS
  Filled 2017-10-09 (×4): qty 100

## 2017-10-09 MED ORDER — FENTANYL CITRATE (PF) 100 MCG/2ML IJ SOLN
25.0000 ug | INTRAMUSCULAR | Status: DC | PRN
  Administered 2017-10-09: 50 ug via INTRAVENOUS
  Filled 2017-10-09: qty 2

## 2017-10-09 MED ORDER — ORAL CARE MOUTH RINSE
15.0000 mL | Freq: Two times a day (BID) | OROMUCOSAL | Status: DC
  Administered 2017-10-09 – 2017-10-11 (×5): 15 mL via OROMUCOSAL

## 2017-10-09 NOTE — ED Notes (Signed)
GI at bedside

## 2017-10-09 NOTE — ED Notes (Addendum)
Pt had episode of bloody stool. Pt screaming and inconsolable. Pt cleaned up. Opyd, MD messaged. Will continue to monitor.

## 2017-10-09 NOTE — ED Notes (Signed)
CBG 137 

## 2017-10-09 NOTE — ED Notes (Signed)
Report attempted 

## 2017-10-09 NOTE — ED Notes (Signed)
Opyd, MD at bedside 

## 2017-10-09 NOTE — ED Notes (Signed)
Pt moaning out in pain, holding mid back.  Purewick not in place, linens wet.  Small amount dark red blood noted at anus.  Pt cleaned, linens changed, purewick changed.

## 2017-10-09 NOTE — Consult Note (Addendum)
[facility] Consult: 9:55 AM 10/09/2017  LOS: 1 day    Referring Provider: Dr Ella Jubilee  Primary Care Physician:  [name] Primary Gastroenterologist:  Dr. Sheryn Bison     Reason for Consultation:  Hematochezia.     HPI: [name] is a 84 y.o. female.  PMH diverticulosis/itis.  Type 2 DM.  CAD, s/p angioplasty/DES stents 04/2016.  S/p cardiac pacemaker for heart block.  On Coumadin for A fib.  Dementia.  Hypothyroidism.  Macrocytic anemia.  Thrombocytopenia.  Deg spinal disease, compression fractures.   2001 Colonoscopy.  For hx of diverticulitis.  Severe pan diverticulosis with greatest density in sigmoid.    02/2006 Colonoscopy.  Dr Jarold Motto for constipation, hematochezia. Severe, extensive pan-diverticulosis, int rrhoids. He added Amitiza for constipation.   oupt meds include oral iron.  Only iron studies well wnl in 2011.    On Sunday, 5/19, she complained to family about some generalized abdominal pain.  At SNF yesterday became non-verbal (normally talkative) and passing blood per rectum.  In quick sequence she had 3 or 4 episodes of bloody stool/hematochezia, granddaughter saw clots of blood.  Once she got to the ED yesterday afternoon she had a couple of more bloody stools, one in the evening the other early this morning.  Normally the patient has a very good appetite but yesterday she did not touch any of her food. Hgb 11.3 >> 10.5, was 9.5 - 9.9 in 06/2016.  MCV 100.  Platelets 108.  PT/INR 28/2.6 >> 15.5/1.2.   TSH normal.  AKI: 65/1.5 Ab/pelic CT without contrast: pleural effusions bil.  Diffuse diverticulosis without diverticulitis, moderate stool distal colon.  Aortic atherosclerosis.    Atrophy and chronic small vsl ischemia on head CT.    Treatments included Flagyl,  cipro, K centra, Vitamin K IV BID Protonix.    Patient has not had previous GI bleed.  Granddaughter tells me that constipation has not been an issue recently.  To her recall, grandma has never required blood transfusion.  Past Medical History:  Diagnosis Date  . Anxiety   . Atrial fibrillation (HCC)    chronic Coumadin  . CHF (congestive heart failure) (HCC)   . Constipation    Rx'd Amitiza in 2007.    . Diverticulosis of colon    hx of diverticulitis.  dense, pan-diverticulosis on colonoscopies 2001, 2007.    . DM (diabetes mellitus) (HCC)   . Hyperlipidemia   . Hypertension   . Hypothyroidism   . Osteoporosis   . Tremor, essential   . Type II or unspecified type diabetes mellitus with neurological manifestations, not stated as uncontrolled(250.60)     Past Surgical History:  Procedure Laterality Date  . CARDIAC CATHETERIZATION N/A 04/20/2016   Procedure: Left Heart Cath and Coronary Angiography;  Surgeon: Yvonne Kendall, MD;  Location: [facility] INVASIVE CV LAB;  Service: Cardiovascular;  Laterality: N/A;  . CARDIAC CATHETERIZATION N/A 04/24/2016   Procedure: Coronary Stent Intervention;  Surgeon: Lennette Bihari, MD;  Location: [facility] INVASIVE CV LAB;  Service: Cardiovascular;  Laterality: N/A;  .  CARDIAC CATHETERIZATION N/A 04/24/2016   Procedure: Coronary Balloon Angioplasty;  Surgeon: Lennette Bihari, MD;  Location: [facility] INVASIVE CV LAB;  Service: Cardiovascular;  Laterality: N/A;  . CARDIAC CATHETERIZATION N/A 04/26/2016   Procedure: Coronary Stent Intervention;  Surgeon: Iran Ouch, MD;  Location: [facility] INVASIVE CV LAB;  Service: Cardiovascular;  Laterality: N/A;  . CATARACT EXTRACTION  2002  . IR THORACENTESIS ASP PLEURAL SPACE W/IMG GUIDE  06/07/2017  . L3 compression fraction    . PACEMAKER PLACEMENT    . TRANSTHORACIC ECHOCARDIOGRAM  2011, 2012    Prior to Admission medications   Medication Sig Start Date End Date Taking? Authorizing Provider  atorvastatin (LIPITOR) 40 MG  tablet Take 1 tablet (40 mg total) by mouth daily at 6 PM. Patient taking differently: Take 40 mg by mouth every evening.  05/01/16  Yes Arty Baumgartner, NP  bisacodyl (DULCOLAX) 10 MG suppository Place 10 mg rectally daily as needed for severe constipation.   Yes [provider]  CARTIA XT 300 MG 24 hr capsule take 1 capsule by mouth once daily 03/09/16  Yes Gordy Savers, MD  docusate sodium (COLACE) 100 MG capsule Take 100 mg by mouth 2 (two) times daily as needed (constipation).   Yes [provider]  ferrous sulfate 325 (65 FE) MG tablet take 1 tablet by mouth once daily 03/31/16  Yes Gordy Savers, MD  furosemide (LASIX) 40 MG tablet take 1 tablet by mouth once daily Patient taking differently: take 1 tablet by mouth twice daily 06/12/16  Yes Gordy Savers, MD  hydrOXYzine (ATARAX/VISTARIL) 10 MG tablet Take 10 mg by mouth 2 (two) times daily. For anxiety/itching (8a and 4p)   Yes [provider]  hyoscyamine (LEVBID) 0.375 MG 12 hr tablet take 1 tablet by mouth twice a day ONLY IF NEEDED  **DO NOT TAKE EVERYDAY MUST LAST 30 DAYS** Patient taking differently: take 1 tablet by mouth twice daily as needed for diverticulosis 12/20/15  Yes Gordy Savers, MD  ketotifen (ZADITOR) 0.025 % ophthalmic solution Place 1 drop into both eyes 2 (two) times daily as needed (itching).   Yes [provider]  levalbuterol (XOPENEX) 0.63 MG/3ML nebulizer solution Take 0.63 mg by nebulization every 6 (six) hours as needed for wheezing or shortness of breath.   Yes [provider]  levothyroxine (SYNTHROID, LEVOTHROID) 100 MCG tablet Take 100 mcg by mouth daily before breakfast.   Yes [provider]  LORazepam (ATIVAN) 0.5 MG tablet Take 0.5 mg by mouth 2 (two) times daily as needed for anxiety.   Yes [provider]  Melatonin 3 MG TABS Take 6 mg by mouth at bedtime.   Yes [provider]  metFORMIN (GLUCOPHAGE)  500 MG tablet Take 500 mg by mouth daily with breakfast.   Yes [provider]  methocarbamol (ROBAXIN) 500 MG tablet Take 500 mg by mouth 3 (three) times daily. For back pain   Yes [provider]  metolazone (ZAROXOLYN) 2.5 MG tablet Take 2.5 mg by mouth See admin instructions. Take 1 tablet (2.5 mg) by mouth daily on Monday and Thursday   Yes [provider]  morphine (MSIR) 15 MG tablet Take 1 tablet (15 mg total) by mouth every 4 (four) hours as needed for severe pain. 07/08/16  Yes Melene Plan, DO  Nutritional Supplements (NUTRITIONAL SUPPLEMENT PO) Take 120 mLs by mouth 3 (three) times daily. "Med Pass"   Yes [provider]  OXYGEN Inhale 2 L  into the lungs as needed (shortness of breath).   Yes [provider]  polyethylene glycol powder (GLYCOLAX/MIRALAX) powder Take 17 g by mouth daily. Mix in liquid and drink   Yes [provider]  potassium chloride SA (K-DUR,KLOR-CON) 20 MEQ tablet Take 20 mEq by mouth daily.   Yes [provider]  senna-docusate (SENNA S) 8.6-50 MG tablet Take 1 tablet by mouth daily as needed (constipation).   Yes [provider]  sertraline (ZOLOFT) 100 MG tablet Take 100 mg by mouth daily.   Yes [provider]  traMADol (ULTRAM) 50 MG tablet Take 1 tablet (50 mg total) by mouth every 6 (six) hours as needed for moderate pain. 10/26/15  Yes Nelwyn Salisbury, MD  warfarin (COUMADIN) 2.5 MG tablet Take 2.5 mg by mouth every evening.   Yes [provider]    Scheduled Meds: . atorvastatin  40 mg Oral q1800  . diltiazem  300 mg Oral Daily  . insulin aspart  0-9 Units Subcutaneous Q4H  . levothyroxine  100 mcg Oral QAC breakfast  . sodium chloride flush  3 mL Intravenous Q12H  . sodium chloride flush  3 mL Intravenous Q12H   Infusions: . sodium chloride     PRN Meds: sodium chloride, fentaNYL (SUBLIMAZE) injection, ketotifen, levalbuterol, LORazepam, ondansetron **OR**  ondansetron (ZOFRAN) IV, sodium chloride flush   Allergies as of 10/08/2017 - Review Complete 10/08/2017  Allergen Reaction Noted  . Codeine sulfate Shortness Of Breath and Other (See Comments)   . Simvastatin Other (See Comments) 08/16/2007    Family History  Problem Relation Age of Onset  . Heart attack Father   . Colon cancer Neg Hx     Social History   Socioeconomic History  . Marital status: Widowed    Spouse name: Not on file  . Number of children: Not on file  . Years of education: Not on file  . Highest education level: Not on file  Occupational History  . Not on file  Social Needs  . Financial resource strain: Not on file  . Food insecurity:    Worry: Not on file    Inability: Not on file  . Transportation needs:    Medical: Not on file    Non-medical: Not on file  Tobacco Use  . Smoking status: Never Smoker  . Smokeless tobacco: Never Used  Substance and Sexual Activity  . Alcohol use: No    Alcohol/week: 0.0 oz  . Drug use: No  . Sexual activity: Not on file  Lifestyle  . Physical activity:    Days per week: Not on file    Minutes per session: Not on file  . Stress: Not on file  Relationships  . Social connections:    Talks on phone: Not on file    Gets together: Not on file    Attends religious service: Not on file    Active member of club or organization: Not on file    Attends meetings of clubs or organizations: Not on file    Relationship status: Not on file  . Intimate partner violence:    Fear of current or ex partner: Not on file    Emotionally abused: Not on file    Physically abused: Not on file    Forced sexual activity: Not on file  Other Topics Concern  . Not on file  Social History Narrative  . Not on file    REVIEW OF SYSTEMS: Constitutional: Patient normally ambulatory.  Because of fall risk, she was provided with a walker in the last few weeks. ENT:  No nose bleeds Pulm: No cough.  No shortness of breath. CV:  No  palpitations, no LE edema.  No chest pain GU:  No hematuria, no frequency GI: No dysphagia.  No recent troubles with constipation. Heme: Other than the intestinal bleeding yesterday, the patient has not had unusual bleeding or bruising. Transfusions: None Neuro:  No headaches, no peripheral tingling or numbness Derm:  No itching, no rash or sores.  Endocrine:  No sweats or chills.  No polyuria or dysuria Immunization: Reviewed.  She is up-to-date on Pneumovax, flu, Td, Zoster.   Travel:  None   PHYSICAL EXAM: Vital signs in last 24 hours: Vitals:   10/09/17 0800 10/09/17 0845  BP: 112/60 (!) 116/58  Pulse: 70 70  Resp:  18  Temp:    SpO2: 100% 100%   Wt Readings from Last 3 Encounters:  09/19/16 143 lb (64.9 kg)  07/06/16 145 lb (65.8 kg)  06/11/16 145 lb (65.8 kg)    General: Frail, aged WF who is resting comfortably on the bed.  She is barely verbal Head: Prominent bones.  No signs of head trauma.  No facial asymmetry or swelling Eyes: No scleral icterus, no conjunctival pallor. Ears: No obvious hearing deficit. Nose: No congestion or discharge. Mouth: Tongue midline.  Oral mucosa slightly dry but clear.  Dentures in place. Neck: No JVD, no masses, no thyromegaly. Lungs: Clear bilaterally.  Open-mouth, nonlabored breathing.  No cough. Heart: RRR.  No MRG.  S1, S2 present.  NSR on telemetry monitor. Abdomen: Soft.  Active bowel sounds.  Not tender.  No HSM, masses, bruits, hernias.   Rectal: Reddish, bloody-brown, soft stool in the vault.  No rectal masses. Musc/Skeltl: No joint erythema or gross deformity other than arthritic changes in her fingers. Extremities: No CCE. Neurologic: Barely verbal.  Not oriented to place, self or situation, date or time.  Not following requests to wiggle her fingers. Skin: No rashes, no sores, no suspicious lesions, no purpura or bruising. Nodes: No cervical adenopathy. Psych: Calm.  Intake/Output from previous day: 05/20 0701 - 05/21  0700 In: 100 [IV Piggyback:100] Out: 150 [Stool:150] Intake/Output this shift: No intake/output data recorded.  LAB RESULTS: Recent Labs    10/08/17 1632 10/08/17 1702 10/08/17 2120  WBC 7.6  --  8.3  HGB 11.3* 12.2 10.5*  HCT 35.4* 36.0 33.4*  PLT 107*  --  108*   BMET Lab Results  Component Value Date   NA 140 10/08/2017   NA 141 10/08/2017   NA 139 07/16/2016   K 3.5 10/08/2017   K 3.3 (L) 10/08/2017   K 3.1 (L) 07/16/2016   CL 98 (L) 10/08/2017   CL 100 (L) 10/08/2017   CL 104 07/16/2016   CO2 29 10/08/2017   CO2 26 07/16/2016   CO2 26 07/08/2016   GLUCOSE 143 (H) 10/08/2017   GLUCOSE 150 (H) 10/08/2017   GLUCOSE 151 (H) 07/16/2016   BUN 65 (H) 10/08/2017   BUN 63 (H) 10/08/2017   BUN 15 07/16/2016   CREATININE 1.50 (H) 10/08/2017   CREATININE 1.69 (H) 10/08/2017   CREATININE 1.03 (H) 07/16/2016   CALCIUM 9.2 10/08/2017   CALCIUM 9.0 07/16/2016   CALCIUM 9.2 07/08/2016   LFT Recent Labs    10/08/17 1632  PROT 6.0*  ALBUMIN 2.9*  AST 17  ALT 15  ALKPHOS 87  BILITOT 1.3*   PT/INR Lab  Results  Component Value Date   INR 1.24 10/09/2017   INR 1.38 10/08/2017   INR 2.41 10/08/2017   Hepatitis Panel No results for input(s): HEPBSAG, HCVAB, HEPAIGM, HEPBIGM in the last 72 hours. C-Diff No components found for: CDIFF Lipase     Component Value Date/Time   LIPASE 18 07/08/2016 0742    Drugs of Abuse  No results found for: LABOPIA, COCAINSCRNUR, LABBENZ, AMPHETMU, THCU, LABBARB   RADIOLOGY STUDIES: Ct Abdomen Pelvis Wo Contrast  Result Date: 10/08/2017 CLINICAL DATA:  Abdominal pain with nausea and vomiting EXAM: CT ABDOMEN AND PELVIS WITHOUT CONTRAST TECHNIQUE: Multidetector CT imaging of the abdomen and pelvis was performed following the standard protocol without IV contrast. COMPARISON:  CT abdomen pelvis 07/08/2016 FINDINGS: LOWER CHEST: Large right and small left pleural effusions with associated atelectasis. HEPATOBILIARY: Normal hepatic  contours and density. No intra- or extrahepatic biliary dilatation. Normal gallbladder. PANCREAS: Normal parenchymal contours without ductal dilatation. No peripancreatic fluid collection. SPLEEN: Normal. ADRENALS/URINARY TRACT: --Adrenal glands: Normal. --Right kidney/ureter: Right renal cyst now measures up to 4.4 cm, previously 3.4 cm. There are small calcifications at the renal pelvis, probably vascular. --Left kidney/ureter: Unchanged 1.8 cm left renal cyst. No hydronephrosis. --Urinary bladder: Normal for degree of distention STOMACH/BOWEL: --Stomach/Duodenum: No hiatal hernia or other gastric abnormality. Normal duodenal course. --Small bowel: No dilatation or inflammation. --Colon: There is diffuse colonic diverticulosis without acute inflammation. Moderate volume of stool in distal colon. --Appendix: Not visualized. No right lower quadrant inflammation or free fluid. VASCULAR/LYMPHATIC: Atherosclerotic calcification is present within the non-aneurysmal abdominal aorta, without hemodynamically significant stenosis. no abdominal or pelvic lymphadenopathy. REPRODUCTIVE: Partially calcified uterine fibroid.  No adnexal mass. MUSCULOSKELETAL. Multilevel degenerative disc disease and facet arthrosis. No bony spinal canal stenosis. Unchanged L3 superior endplate Schmorl's node. OTHER: None. IMPRESSION: 1. Large right and small left pleural effusion. 2. Diffuse diverticulosis without acute inflammation. 3.  Aortic Atherosclerosis (ICD10-I70.0). Electronically Signed   By: Deatra Robinson M.D.   On: 10/08/2017 20:20   Ct Head Wo Contrast  Result Date: 10/08/2017 CLINICAL DATA:  Altered level of consciousness EXAM: CT HEAD WITHOUT CONTRAST TECHNIQUE: Contiguous axial images were obtained from the base of the skull through the vertex without intravenous contrast. COMPARISON:  07/06/2016 FINDINGS: BRAIN: There is sulcal and ventricular prominence consistent with superficial and central atrophy. No intraparenchymal  hemorrhage, mass effect nor midline shift. Periventricular and subcortical white matter hypodensities consistent with chronic small vessel ischemic disease are identified. No acute large vascular territory infarcts. No abnormal extra-axial fluid collections. Basal cisterns are not effaced and midline. VASCULAR: Moderate calcific atherosclerosis of the carotid siphons. SKULL: No skull fracture. No significant scalp soft tissue swelling. SINUSES/ORBITS: The mastoid air-cells are clear. The included paranasal sinuses are well-aerated.The included ocular globes and orbital contents are non-suspicious. OTHER: None. IMPRESSION: Atrophy with chronic small vessel ischemia. No acute intracranial abnormality. Electronically Signed   By: Tollie Eth M.D.   On: 10/08/2017 23:55     IMPRESSION:   *   GIB.  Likely diverticular with Blood PR.  Bleeding has slowed down with reversal of Coumadin.  *   Chronic Macrocytic anemia.  Not sure why she take po iron daily especially considering issues with constipation in past. Need to check B12, folate levels.     *  Lactic acidosis, resolved.    *   Chronic Coumadin for A fib, reversed with Vit K and K centra.    *   AMS in pt with baseline dementia.  Not surprising in setting of acute medical issues.   Head CT showing chronic vascular changes.  *  Pleural effusions.      PLAN:     *   Stop the IV Protonix.    *   CBC in AM.  B12 ordered but not collected.  I reordered B12, BMET, hgb/crit for this afternoon.  CBC in AM  *   If she were to develop recurrent aggressive bleeding, consider nuclear medicine study.  *   Hold Coumadin as doing.    *   NPO for now, given her mental status not sure it would be safe.    Jennye Moccasin  10/09/2017, 9:55 AM Phone [phone]   Attending physician's note   I have taken an interval history, reviewed the chart and examined the patient. I agree with the Advanced Practitioner's note, impression and recommendations.   Discussed with patient's granddaughter.  79 year old white female with advanced dementia, DM, CAD s/p angioplasty 04/2016, cardiac pacemaker for heart block, A Fib on Coumadin, DJD, osteoporosis with multiple compression fractures admitted with painless hematochezia.  Coumadin has been reversed.  Last colonoscopy 2007 by Dr. Jarold Motto showed extensive pancolonic diverticulosis.  Likely diverticular bleed. Plan: Conservative management. Trend CBC. Hold off on colonoscopy due to multiple comorbid conditions, unless there is active bleeding.   Edman Circle, MD

## 2017-10-09 NOTE — Progress Notes (Signed)
PROGRESS NOTE    [name]  ZOX:[id number] DOB: 07/08/1926 DOA: 10/08/2017 PCP: [name]    Brief Narrative:  84 year old female who presented with lethargy and rectal bleeding.  She does have the significant past medical history for dementia, atrial fibrillation, complete heart block status post pacemaker, systolic heart failure, hypothyroidism, and depression/anxiety.  Patient was noted to be nonverbal at the skilled nursing facility,with large amounts of red and maroon blood in her diaper.  Due to acute change in her condition she was sent to the emergency room for further evaluation.  On the initial physical examination blood pressure 111/61, heart rate 71, respiratory 23, oxygen saturation 98%.  She was lethargic, moist mucous membranes, lungs were clear to auscultation bilaterally, heart S1-S2 present rhythmic, abdomen was soft nontender, lower extremities with no edema, positive soft mobile mass at the lateral distal aspect of her right leg.  Sodium 141, potassium 3.3, chloride 100, bicarb 29, glucose 150, BUN 63, creatinine 1.69, white count 7.6, hemoglobin 11.3, hematocrit 35.4, platelets 107, INR 2.4.  CT of the abdomen with diffuse diverticulosis without acute inflammation.  Head CT with atrophy, no acute changes.  EKG paced rhythm with premature ventricular complexes.  Patient was admitted to the hospital with the working diagnosis of acute lower gastrointestinal bleeding complicated by acute metabolic encephalopathy and acute kidney injury.    Assessment & Plan:   Principal Problem:   Acute GI bleeding Active Problems:   Hypothyroidism   Diabetes mellitus with neuropathy (HCC)   Anxiety state   Essential hypertension   ATRIAL FIBRILLATION   Chronic combined systolic and diastolic CHF (congestive heart failure) (HCC)   Renal insufficiency   CHB (complete heart block) (HCC) post AVN ablation   Pleural effusion   1.  Acute lower GI bleed. Likely  diverticular bleed, will continue close monitoring of hb and hct. No current indication for PRBC transfusion. Continue antiacid therapy and advance diet as tolerated. Follow on GI recommendations. Patient received vitamin K and prothrombin complex concentrate.   2.  Metabolic encephalopathy. Has improved with supportive medical therapy, will continue neuro checks per unit protocol. Advance diet as tolerated. Received lorazepam this am.   3.  Acute kidney injury. Hold on diuretics and nephrotoxic medications, follow on renal panel in am.   4.  Atrial fibrillation, chronic. Rate controlled with diltiazem 300 mg daily, hold on anticoagulation for now.   5.  Systolic heart failure, chronic and stable. Patient clinically hypovolemic, will continue telemetry monitoring, hold on diuresis for now,.   6.  Hypothyroidism. Continue levothyroxine per home regimen.   7.  Dementia. Patient is more reactive no agitation.   8. T2DM. Will continue insulin sliding scale for glucose cover and monitoring, will continue to hold on metformin.    DVT prophylaxis: scd  Code Status:  full Family Communication: I spoke with patient's daughter and grandaughter at the bedside and all questions were addressed.  Disposition Plan: snf when stable in 48 hours.    Consultants:   GI   Procedures:      Antimicrobials:       Subjective: Patient responds simple questions, complains of abdominal pain , unclear the intensity of further characteristics. No nausea or vomiting. No further bleeding.   Objective: Vitals:   10/09/17 1245 10/09/17 1330 10/09/17 1500 10/09/17 1545  BP: 119/60 (!) 125/53 (!) 113/55 (!) 127/50  Pulse: (!) 42 (!) 39 70 (!) 37  Resp: Temp:  TempSrc:      SpO2: 100% 99% 93% 96%    Intake/Output Summary (Last 24 hours) at 10/09/2017 1631 Last data filed at 10/09/2017 1320 Gross per 24 hour  Intake 100 ml  Output 650 ml  Net -550 ml   There were no vitals filed for  this visit.  Examination:   General: deconditioned and ill looking appearing.  Neurology: somnolent, follows simple commands and responds to simple questions, non focal  E ENT: mild pallor, no icterus, oral mucosa moist Cardiovascular: No JVD. S1-S2 present, rhythmic, no gallops, rubs, or murmurs. No lower extremity edema. Pulmonary: decreased breath sounds bilaterally, adequate air movement, no wheezing, rhonchi or rales. Gastrointestinal. Abdomen with no organomegaly, non tender, no rebound or guarding Skin. No rashes Musculoskeletal: no joint deformities     Data Reviewed: I have personally reviewed following labs and imaging studies  CBC: Recent Labs  Lab 10/08/17 1632 10/08/17 1702 10/08/17 2120 10/09/17 1150  WBC 7.6  --  8.3  --   HGB 11.3* 12.2 10.5* 10.3*  HCT 35.4* 36.0 33.4* 33.1*  MCV 100.3*  --  100.6*  --   PLT 107*  --  108*  --    Basic Metabolic Panel: Recent Labs  Lab 10/08/17 1632 10/08/17 1702 10/09/17 0312 10/09/17 1150  NA 141 140  --  142  K 3.3* 3.5  --  2.7*  CL 100* 98*  --  106  CO2 29  --   --  26  GLUCOSE 150* 143*  --  104*  BUN 63* 65*  --  58*  CREATININE 1.69* 1.50*  --  1.42*  CALCIUM 9.2  --   --  8.6*  MG  --   --  2.4  --    GFR: CrCl cannot be calculated (Unknown ideal weight.). Liver Function Tests: Recent Labs  Lab 10/08/17 1632  AST 17  ALT 15  ALKPHOS 87  BILITOT 1.3*  PROT 6.0*  ALBUMIN 2.9*   No results for input(s): LIPASE, AMYLASE in the last 168 hours. No results for input(s): AMMONIA in the last 168 hours. Coagulation Profile: Recent Labs  Lab 10/08/17 1632 10/08/17 2055 10/09/17 0312  INR 2.41 1.38 1.24   Cardiac Enzymes: No results for input(s): CKTOTAL, CKMB, CKMBINDEX, TROPONINI in the last 168 hours. BNP (last 3 results) No results for input(s): PROBNP in the last 8760 hours. HbA1C: No results for input(s): HGBA1C in the last 72 hours. CBG: Recent Labs  Lab 10/09/17 0121 10/09/17 0357  10/09/17 0616 10/09/17 0924 10/09/17 1359  GLUCAP 137* 78 97 111* 94   Lipid Profile: No results for input(s): CHOL, HDL, LDLCALC, TRIG, CHOLHDL, LDLDIRECT in the last 72 hours. Thyroid Function Tests: Recent Labs    10/08/17 2222  TSH 3.744   Anemia Panel: Recent Labs    10/09/17 1150  VITAMINB12 643      Radiology Studies: I have reviewed all of the imaging during this hospital visit personally     Scheduled Meds: . atorvastatin  40 mg Oral q1800  . diltiazem  300 mg Oral Daily  . insulin aspart  0-9 Units Subcutaneous Q4H  . levothyroxine  100 mcg Oral QAC breakfast  . sodium chloride flush  3 mL Intravenous Q12H  . sodium chloride flush  3 mL Intravenous Q12H   Continuous Infusions: . sodium chloride       LOS: 1 day        Mauricio Annett Gula, MD [facility] Pager [phone]

## 2017-10-10 DIAGNOSIS — I10 Essential (primary) hypertension: Secondary | ICD-10-CM

## 2017-10-10 DIAGNOSIS — F411 Generalized anxiety disorder: Secondary | ICD-10-CM

## 2017-10-10 LAB — CBC
HCT: 34.8 % — ABNORMAL LOW (ref 36.0–46.0)
HEMOGLOBIN: 10.9 g/dL — AB (ref 12.0–15.0)
MCH: 32.1 pg (ref 26.0–34.0)
MCHC: 31.3 g/dL (ref 30.0–36.0)
MCV: 102.4 fL — AB (ref 78.0–100.0)
PLATELETS: 106 10*3/uL — AB (ref 150–400)
RBC: 3.4 MIL/uL — AB (ref 3.87–5.11)
RDW: 16.7 % — ABNORMAL HIGH (ref 11.5–15.5)
WBC: 6.7 10*3/uL (ref 4.0–10.5)

## 2017-10-10 LAB — BASIC METABOLIC PANEL
ANION GAP: 12 (ref 5–15)
BUN: 51 mg/dL — ABNORMAL HIGH (ref 6–20)
CO2: 22 mmol/L (ref 22–32)
Calcium: 8.7 mg/dL — ABNORMAL LOW (ref 8.9–10.3)
Chloride: 103 mmol/L (ref 101–111)
Creatinine, Ser: 1.22 mg/dL — ABNORMAL HIGH (ref 0.44–1.00)
GFR calc Af Amer: 44 mL/min — ABNORMAL LOW (ref >60)
GFR, EST NON AFRICAN AMERICAN: 38 mL/min — AB (ref >60)
GLUCOSE: 88 mg/dL (ref 65–99)
POTASSIUM: 3.4 mmol/L — AB (ref 3.5–5.1)
SODIUM: 137 mmol/L (ref 135–145)

## 2017-10-10 LAB — FOLATE RBC
Folate, Hemolysate: >620 ng/mL
Folate, RBC: >1746 ng/mL (ref >498)
Hematocrit: 35.5 % (ref 34.0–46.6)

## 2017-10-10 LAB — GLUCOSE, CAPILLARY
GLUCOSE-CAPILLARY: 91 mg/dL (ref 65–99)
GLUCOSE-CAPILLARY: 82 mg/dL (ref 65–99)
GLUCOSE-CAPILLARY: 157 mg/dL — AB (ref 65–99)
GLUCOSE-CAPILLARY: 97 mg/dL (ref 65–99)
Glucose-Capillary: 99 mg/dL (ref 65–99)
Glucose-Capillary: 129 mg/dL — ABNORMAL HIGH (ref 65–99)

## 2017-10-10 LAB — URINE CULTURE: CULTURE: NO GROWTH

## 2017-10-10 MED ORDER — MAGIC MOUTHWASH: 5.0000 mL | Freq: Three times a day (TID) | ORAL | Status: DC | PRN

## 2017-10-10 MED ORDER — MORPHINE SULFATE 15 MG PO TABS
15.0000 mg | ORAL_TABLET | ORAL | Status: DC | PRN
  Administered 2017-10-10 – 2017-10-12 (×3): 15 mg via ORAL
  Filled 2017-10-10 (×4): qty 1

## 2017-10-10 MED ORDER — HYDROCODONE-ACETAMINOPHEN 5-325 MG PO TABS
2.0000 | ORAL_TABLET | Freq: Once | ORAL | Status: AC
  Administered 2017-10-10: 2 via ORAL
  Filled 2017-10-10: qty 2

## 2017-10-10 NOTE — Progress Notes (Signed)
PROGRESS NOTE    ELVI LEVENTHAL  ZOX:[id number] DOB: [birth year] DOA: 10/08/2017 PCP: [name]    Brief Narrative:  84 year old female who presented with lethargy and rectal bleeding.  She does have the significant past medical history for dementia, atrial fibrillation, complete heart block status post pacemaker, systolic heart failure, hypothyroidism, and depression/anxiety.  Patient was noted to be nonverbal at the skilled nursing facility,with large amounts of red and maroon blood in her diaper.  Due to acute change in her condition she was sent to the emergency room for further evaluation.  On the initial physical examination blood pressure 111/61, heart rate 71, respiratory 23, oxygen saturation 98%.  She was lethargic, moist mucous membranes, lungs were clear to auscultation bilaterally, heart S1-S2 present rhythmic, abdomen was soft nontender, lower extremities with no edema, positive soft mobile mass at the lateral distal aspect of her right leg.  Sodium 141, potassium 3.3, chloride 100, bicarb 29, glucose 150, BUN 63, creatinine 1.69, white count 7.6, hemoglobin 11.3, hematocrit 35.4, platelets 107, INR 2.4.  CT of the abdomen with diffuse diverticulosis without acute inflammation.  Head CT with atrophy, no acute changes.  EKG paced rhythm with premature ventricular complexes.  Patient was admitted to the hospital with the working diagnosis of acute lower gastrointestinal bleeding complicated by acute metabolic encephalopathy and acute kidney injury.  Assessment & Plan:   Principal Problem:   Acute GI bleeding Active Problems:   Hypothyroidism   Diabetes mellitus with neuropathy (HCC)   Anxiety state   Essential hypertension   ATRIAL FIBRILLATION   Chronic combined systolic and diastolic CHF (congestive heart failure) (HCC)   Renal insufficiency   CHB (complete heart block) (HCC) post AVN ablation   Pleural effusion  1.  Acute lower GI bleed. Likely  diverticular bleed, will continue close monitoring of hb and hct. No current indication for PRBC transfusion.  Plans for invasive work-up.  Will follow CBC trend.  Question risk versus benefits of continuing Coumadin as outpatient noted.   2.  Metabolic encephalopathy. Has improved with supportive medical therapy, will continue neuro checks per unit protocol. Advance diet as tolerated.  Avoid overmedication of sedating medicines.  Appears conversant and appropriate this morning.   3.  Acute kidney injury.  Reticulocyte the nephrotoxic agents currently on hold.  Renal function has since improved.  Repeat basic metabolic panel in the morning  4.  Atrial fibrillation, chronic. Rate controlled with diltiazem 300 mg daily.  Currently remains off of anticoagulation given concerns for acute blood loss anemia.   5.  Systolic heart failure, chronic and stable. Patient clinically hypovolemic, will continue telemetry monitoring.  Continue to hold off on diuretics as per above   6.  Hypothyroidism. Continue levothyroxine patient tolerates   7.  Dementia. Patient seems somewhat confused although she does converse appropriately.  Per nursing staff, patient's mentation seems much improved overnight.   8. T2DM.  Metformin currently on hold.  Continue sliding scale insulin   DVT prophylaxis: SCDs Code Status: Full code Family Communication: Patient in room, family not at bedside Disposition Plan: Uncertain at this time  Consultants:   Gastroenterology  Procedures:     Antimicrobials: Anti-infectives (From admission, onward)   Start     Dose/Rate Route Frequency Ordered Stop   10/08/17 2115  ciprofloxacin (CIPRO) IVPB 400 mg  Status:  Discontinued     400 mg 200 mL/hr over 60 Minutes Intravenous  Once 10/08/17 2108 10/08/17 2205   10/08/17 2115  metroNIDAZOLE (  FLAGYL) IVPB 500 mg  Status:  Discontinued     500 mg 100 mL/hr over 60 Minutes Intravenous  Once 10/08/17 2108 10/08/17 2205        Subjective: No complaints.  Objective: Vitals:   10/10/17 0356 10/10/17 0736 10/10/17 1155 10/10/17 1700  BP: 120/65 119/70 (!) 112/55 108/73  Pulse: 74  84   Resp: 19  (!) 25 (!) 21  Temp: 98.4 F (36.9 C) 98.5 F (36.9 C)  98.8 F (37.1 C)  TempSrc: Oral Oral    SpO2: 100% 100% 93% 98%  Weight: 49.9 kg (110 lb)     Height:        Intake/Output Summary (Last 24 hours) at 10/10/2017 1722 Last data filed at 10/10/2017 1400 Gross per 24 hour  Intake 360 ml  Output 300 ml  Net 60 ml   Filed Weights   10/09/17 1710 10/10/17 0356  Weight: 48.8 kg (107 lb 9.4 oz) 49.9 kg (110 lb)    Examination:  General exam: Appears calm and comfortable  Respiratory system: Clear to auscultation. Respiratory effort normal. Cardiovascular system: S1 & S2 heard, RRR Gastrointestinal system: Abdomen is nondistended, soft and nontender. No organomegaly or masses felt. Normal bowel sounds heard. Central nervous system: Alert and oriented. No focal neurological deficits. Extremities: Symmetric 5 x 5 power. Skin: No rashes, lesions Psychiatry: Judgement and insight appear normal. Mood & affect appropriate.   Data Reviewed: I have personally reviewed following labs and imaging studies  CBC: Recent Labs  Lab 10/08/17 1632 10/08/17 1702 10/08/17 2120 10/08/17 2222 10/09/17 1150 10/10/17 0402  WBC 7.6  --  8.3  --   --  6.7  HGB 11.3* 12.2 10.5*  --  10.3* 10.9*  HCT 35.4* 36.0 33.4* 35.5 33.1* 34.8*  MCV 100.3*  --  100.6*  --   --  102.4*  PLT 107*  --  108*  --   --  106*   Basic Metabolic Panel: Recent Labs  Lab 10/08/17 1632 10/08/17 1702 10/09/17 0312 10/09/17 1150 10/10/17 0402  NA 141 140  --  142 137  K 3.3* 3.5  --  2.7* 3.4*  CL 100* 98*  --  106 103  CO2 29  --   --  26 22  GLUCOSE 150* 143*  --  104* 88  BUN 63* 65*  --  58* 51*  CREATININE 1.69* 1.50*  --  1.42* 1.22*  CALCIUM 9.2  --   --  8.6* 8.7*  MG  --   --  2.4  --   --    GFR: Estimated  Creatinine Clearance: 25.1 mL/min (A) (by C-G formula based on SCr of 1.22 mg/dL (H)). Liver Function Tests: Recent Labs  Lab 10/08/17 1632  AST 17  ALT 15  ALKPHOS 87  BILITOT 1.3*  PROT 6.0*  ALBUMIN 2.9*   No results for input(s): LIPASE, AMYLASE in the last 168 hours. No results for input(s): AMMONIA in the last 168 hours. Coagulation Profile: Recent Labs  Lab 10/08/17 1632 10/08/17 2055 10/09/17 0312  INR 2.41 1.38 1.24   Cardiac Enzymes: No results for input(s): CKTOTAL, CKMB, CKMBINDEX, TROPONINI in the last 168 hours. BNP (last 3 results) No results for input(s): PROBNP in the last 8760 hours. HbA1C: No results for input(s): HGBA1C in the last 72 hours. CBG: Recent Labs  Lab 10/09/17 2124 10/10/17 0104 10/10/17 0416 10/10/17 0809 10/10/17 1200  GLUCAP 134* 91 99 82 157*   Lipid Profile: No results for  input(s): CHOL, HDL, LDLCALC, TRIG, CHOLHDL, LDLDIRECT in the last 72 hours. Thyroid Function Tests: Recent Labs    10/08/17 2222  TSH 3.744   Anemia Panel: Recent Labs    10/09/17 1150  VITAMINB12 643   Sepsis Labs: Recent Labs  Lab 10/08/17 1703 10/08/17 1944  LATICACIDVEN 2.12* 1.55    Recent Results (from the past 240 hour(s))  Urine culture     Status: None   Collection Time: 10/08/17 12:38 AM  Result Value Ref Range Status   Specimen Description URINE, CATHETERIZED  Final   Special Requests NONE  Final   Culture   Final    NO GROWTH Performed at [facility], [address] [place], [place] [address]    Report Status 10/10/2017 FINAL  Final  MRSA PCR Screening     Status: None   Collection Time: 10/09/17  6:35 PM  Result Value Ref Range Status   MRSA by PCR NEGATIVE NEGATIVE Final    Comment:        The GeneXpert MRSA Assay (FDA approved for NASAL specimens only), is one component of a comprehensive MRSA colonization surveillance program. It is not intended to diagnose MRSA infection nor to guide or monitor treatment  for MRSA infections. Performed at [facility], [address], [place], [place] [address]      Radiology Studies: Ct Abdomen Pelvis Wo Contrast  Result Date: 10/08/2017 CLINICAL DATA:  Abdominal pain with nausea and vomiting EXAM: CT ABDOMEN AND PELVIS WITHOUT CONTRAST TECHNIQUE: Multidetector CT imaging of the abdomen and pelvis was performed following the standard protocol without IV contrast. COMPARISON:  CT abdomen pelvis 07/08/2016 FINDINGS: LOWER CHEST: Large right and small left pleural effusions with associated atelectasis. HEPATOBILIARY: Normal hepatic contours and density. No intra- or extrahepatic biliary dilatation. Normal gallbladder. PANCREAS: Normal parenchymal contours without ductal dilatation. No peripancreatic fluid collection. SPLEEN: Normal. ADRENALS/URINARY TRACT: --Adrenal glands: Normal. --Right kidney/ureter: Right renal cyst now measures up to 4.4 cm, previously 3.4 cm. There are small calcifications at the renal pelvis, probably vascular. --Left kidney/ureter: Unchanged 1.8 cm left renal cyst. No hydronephrosis. --Urinary bladder: Normal for degree of distention STOMACH/BOWEL: --Stomach/Duodenum: No hiatal hernia or other gastric abnormality. Normal duodenal course. --Small bowel: No dilatation or inflammation. --Colon: There is diffuse colonic diverticulosis without acute inflammation. Moderate volume of stool in distal colon. --Appendix: Not visualized. No right lower quadrant inflammation or free fluid. VASCULAR/LYMPHATIC: Atherosclerotic calcification is present within the non-aneurysmal abdominal aorta, without hemodynamically significant stenosis. no abdominal or pelvic lymphadenopathy. REPRODUCTIVE: Partially calcified uterine fibroid.  No adnexal mass. MUSCULOSKELETAL. Multilevel degenerative disc disease and facet arthrosis. No bony spinal canal stenosis. Unchanged L3 superior endplate Schmorl's node. OTHER: None. IMPRESSION: 1. Large right and small left pleural  effusion. 2. Diffuse diverticulosis without acute inflammation. 3.  Aortic Atherosclerosis (ICD10-I70.0). Electronically Signed   By: Deatra Robinson M.D.   On: 10/08/2017 20:20   Ct Head Wo Contrast  Result Date: 10/08/2017 CLINICAL DATA:  Altered level of consciousness EXAM: CT HEAD WITHOUT CONTRAST TECHNIQUE: Contiguous axial images were obtained from the base of the skull through the vertex without intravenous contrast. COMPARISON:  07/06/2016 FINDINGS: BRAIN: There is sulcal and ventricular prominence consistent with superficial and central atrophy. No intraparenchymal hemorrhage, mass effect nor midline shift. Periventricular and subcortical white matter hypodensities consistent with chronic small vessel ischemic disease are identified. No acute large vascular territory infarcts. No abnormal extra-axial fluid collections. Basal cisterns are not effaced and midline. VASCULAR: Moderate  calcific atherosclerosis of the carotid siphons. SKULL: No skull fracture. No significant scalp soft tissue swelling. SINUSES/ORBITS: The mastoid air-cells are clear. The included paranasal sinuses are well-aerated.The included ocular globes and orbital contents are non-suspicious. OTHER: None. IMPRESSION: Atrophy with chronic small vessel ischemia. No acute intracranial abnormality. Electronically Signed   By: Tollie Eth M.D.   On: 10/08/2017 23:55    Scheduled Meds: . atorvastatin  40 mg Oral q1800  . diltiazem  300 mg Oral Daily  . insulin aspart  0-9 Units Subcutaneous Q4H  . levothyroxine  100 mcg Oral QAC breakfast  . mouth rinse  15 mL Mouth Rinse BID  . sodium chloride flush  3 mL Intravenous Q12H  . sodium chloride flush  3 mL Intravenous Q12H   Continuous Infusions: . sodium chloride       LOS: 2 days   Rickey Barbara, MD [facility] Pager [phone]  If 7PM-7AM, please contact night-coverage [email or web address] Password [facility] 10/10/2017, 5:22 PM

## 2017-10-10 NOTE — Clinical Social Work Note (Signed)
Clinical Social Work Assessment  Patient Details  Name: [name] MRN: [id number] Date of Birth: 1926/05/25  Date of referral:  10/10/17               Reason for consult:  Discharge Planning                Permission sought to share information with:  Facility Medical sales representative, Family Supports Permission granted to share information::  Yes, Verbal Permission Granted  Name::     Financial controller::  [name]  Relationship::  Granddaughter  Contact Information:  [phone]  Housing/Transportation Living arrangements for the past 2 months:  Skilled Nursing Facility Source of Information:  Other (Comment Required) Patient Interpreter Needed:  None Criminal Activity/Legal Involvement Pertinent to Current Situation/Hospitalization:  No - Comment as needed Significant Relationships:  Adult Children, Other Family Members Lives with:  Facility Resident Do you feel safe going back to the place where you live?  Yes Need for family participation in patient care:  Yes (Comment)  Care giving concerns:  CSW received regarding discharge planning. CSW spoke with patient's granddaughter, [name]. She reported that patient has been at [name] for long term care and will return at discharge. CSW to continue to follow and assist with discharge planning needs.   Social Worker assessment / plan:  CSW spoke with patient'sgranddaughter concerning possibility of rehab at [name] before returning home.  Employment status:  Retired Database administrator PT Recommendations:  Not assessed at this time Information / Referral to community resources:  Skilled Nursing Facility  Patient/Family's Response to care:  Patient's granddaughter reports understanding of discharge plan and will go to [name] today to complete admission paperwork.   Patient/Family's Understanding of and Emotional Response to Diagnosis, Current Treatment, and Prognosis:  Patient/family is realistic  regarding therapy needs and expressed being hopeful for return to SNF placement. Patient's granddaughter expressed understanding of CSW role and discharge process as well as medical condition. No questions/concerns about plan or treatment.    Emotional Assessment Appearance:  Appears stated age Attitude/Demeanor/Rapport:  Unable to Assess Affect (typically observed):  Unable to Assess Orientation:  Oriented to Self Alcohol / Substance use:  Not Applicable Psych involvement (Current and /or in the community):  No (Comment)  Discharge Needs  Concerns to be addressed:  Care Coordination Readmission within the last 30 days:  No Current discharge risk:  None Barriers to Discharge:  Continued Medical Work up   [name], LCSWA 10/10/2017, 10:49 AM

## 2017-10-10 NOTE — Progress Notes (Signed)
Pt complained of pain continuously, no PRN pain med on file. NP on call was notified. Vicodin was ordered and administered. Will continue to monitor.

## 2017-10-10 NOTE — NC FL2 (Signed)
MEDICAID FL2 LEVEL OF CARE SCREENING TOOL     IDENTIFICATION  Patient Name: [name] Birthdate: Dec 06, 1926 Sex: female Admission Date (Current Location): 10/08/2017  [facility] and [place] Number:  Producer, television/film/video and Address:  [facility] [place], [address], [place], [place] [address]      Provider Number: [id number]  Attending Physician Name and Address:  Jerald Kief, MD  Relative Name and Phone Number:  Marylene Land granddaughter, [phone]    Current Level of Care: Hospital Recommended Level of Care: Skilled Nursing Facility Prior Approval Number:    Date Approved/Denied:   PASRR Number: [id number]  Discharge Plan: SNF    Current Diagnoses: Patient Active Problem List   Diagnosis Date Noted  . Acute GI bleeding 10/08/2017  . Pleural effusion 10/08/2017  . CHB (complete heart block) (HCC) post AVN ablation 04/22/2016  . PAH (pulmonary artery hypertension) (HCC) 04/22/2016  . Encounter for therapeutic drug monitoring 09/04/2013  . Long term current use of anticoagulant therapy 01/06/2013  . Renal insufficiency 09/12/2010  . Hearing loss 11/08/2009  . PACEMAKER, PERMANENT 10/13/2008  . OSTEOPOROSIS 07/05/2007  . Dyslipidemia 05/03/2007  . TREMOR, ESSENTIAL 02/26/2007  . Chronic combined systolic and diastolic CHF (congestive heart failure) (HCC) 01/26/2007  . Hypothyroidism 01/02/2007  . Diabetes mellitus with neuropathy (HCC) 01/02/2007  . Anxiety state 01/02/2007  . Essential hypertension 01/02/2007  . ATRIAL FIBRILLATION 01/02/2007  . DIVERTICULOSIS, COLON 01/02/2007    Orientation RESPIRATION BLADDER Height & Weight     Self  Normal Incontinent Weight: 49.9 kg (110 lb) Height:   (162.6 cm)  BEHAVIORAL SYMPTOMS/MOOD NEUROLOGICAL BOWEL NUTRITION STATUS      Incontinent Diet(Please see DC Summary)  AMBULATORY STATUS COMMUNICATION OF NEEDS Skin   Limited Assist Verbally Normal                        Personal Care Assistance Level of Assistance  Bathing, Feeding, Dressing Bathing Assistance: Limited assistance Feeding assistance: Limited assistance Dressing Assistance: Limited assistance     Functional Limitations Info  Sight, Hearing, Speech Sight Info: Adequate Hearing Info: Adequate Speech Info: Adequate(Dentures)    SPECIAL CARE FACTORS FREQUENCY                       Contractures      Additional Factors Info  Code Status, Allergies, Insulin Sliding Scale Code Status Info: Full Allergies Info: Codeine Sulfate, Simvastatin   Insulin Sliding Scale Info: Every 4 hours       Current Medications (10/10/2017):  This is the current hospital active medication list Current Facility-Administered Medications  Medication Dose Route Frequency Provider Last Rate Last Dose  . 0.9 %  sodium chloride infusion  250 mL Intravenous PRN Opyd, Lavone Neri, MD      . atorvastatin (LIPITOR) tablet 40 mg  40 mg Oral q1800 Opyd, Lavone Neri, MD   40 mg at 10/09/17 1827  . diltiazem (CARDIZEM CD) 24 hr capsule 300 mg  300 mg Oral Daily Opyd, Lavone Neri, MD   300 mg at 10/10/17 0904  . insulin aspart (novoLOG) injection 0-9 Units  0-9 Units Subcutaneous Q4H Opyd, Lavone Neri, MD   1 Units at 10/09/17 2143  . ketotifen (ZADITOR) 0.025 % ophthalmic solution 1 drop  1 drop Both Eyes BID PRN Opyd, Lavone Neri, MD      . levalbuterol (XOPENEX) nebulizer solution 0.63 mg  0.63 mg Nebulization Q6H  PRN Opyd, Lavone Neri, MD      . levothyroxine (SYNTHROID, LEVOTHROID) tablet 100 mcg  100 mcg Oral QAC breakfast Opyd, Lavone Neri, MD   100 mcg at 10/10/17 0802  . LORazepam (ATIVAN) injection 0.5 mg  0.5 mg Intravenous Q4H PRN Opyd, Lavone Neri, MD   0.5 mg at 10/09/17 0455  . MEDLINE mouth rinse  15 mL Mouth Rinse BID Arrien, York Ram, MD   15 mL at 10/10/17 1039  . ondansetron (ZOFRAN) tablet 4 mg  4 mg Oral Q6H PRN Opyd, Lavone Neri, MD       Or  . ondansetron (ZOFRAN) injection 4 mg  4 mg Intravenous  Q6H PRN Opyd, Lavone Neri, MD      . sodium chloride flush (NS) 0.9 % injection 3 mL  3 mL Intravenous Q12H Opyd, Lavone Neri, MD   3 mL at 10/10/17 0900  . sodium chloride flush (NS) 0.9 % injection 3 mL  3 mL Intravenous Q12H Opyd, Lavone Neri, MD   Stopped at 10/09/17 1002  . sodium chloride flush (NS) 0.9 % injection 3 mL  3 mL Intravenous PRN Opyd, Lavone Neri, MD   3 mL at 10/10/17 1039     Discharge Medications: Please see discharge summary for a list of discharge medications.  Relevant Imaging Results:  Relevant Lab Results:   Additional Information SSN: [id number] [address] [place], [place]

## 2017-10-10 NOTE — Progress Notes (Addendum)
Daily Rounding Note  10/10/2017, 10:01 AM  LOS: 2 days   SUBJECTIVE:   Chief complaint: none.  No abd pain.  Stools black/dark/loose: 2 in last 12 hours.  Eating well, solid food   OBJECTIVE:         Vital signs in last 24 hours:    Temp:  [97.5 F (36.4 C)-98.8 F (37.1 C)] 98.5 F (36.9 C) (05/22 0736) Pulse Rate:  [37-80] 74 (05/22 0356) Resp:  [13-19] 19 (05/22 0356) BP: (102-129)/(49-73) 119/70 (05/22 0736) SpO2:  [93 %-100 %] 100 % (05/22 0736) Weight:  [107 lb 9.4 oz (48.8 kg)-110 lb (49.9 kg)] 110 lb (49.9 kg) (05/22 0356) Last BM Date: 10/09/17 Filed Weights   10/09/17 1710 10/10/17 0356  Weight: 107 lb 9.4 oz (48.8 kg) 110 lb (49.9 kg)   General: looks 500% better, sitting up smiling, eating, talking   Heart: RRR with murmer Chest: clear bil, reduced BS at bases.  No cough or labored breathing Abdomen: soft, NT, ND.  Active BS  Extremities: no CCE Neuro/Psych:  Alert. Oriented to her self.  Appropriate.  Moving all 4 limbs.    Intake/Output from previous day: 05/21 0701 - 05/22 0700 In: 1120 [P.O.:120; I.V.:1000] Out: 800 [Urine:800]  Intake/Output this shift: No intake/output data recorded.  Lab Results: Recent Labs    10/08/17 1632  10/08/17 2120 10/09/17 1150 10/10/17 0402  WBC 7.6  --  8.3  --  6.7  HGB 11.3*   < > 10.5* 10.3* 10.9*  HCT 35.4*   < > 33.4* 33.1* 34.8*  PLT 107*  --  108*  --  106*   < > = values in this interval not displayed.   BMET Recent Labs    10/08/17 1632 10/08/17 1702 10/09/17 1150 10/10/17 0402  NA 141 140 142 137  K 3.3* 3.5 2.7* 3.4*  CL 100* 98* 106 103  CO2 29  --  26 22  GLUCOSE 150* 143* 104* 88  BUN 63* 65* 58* 51*  CREATININE 1.69* 1.50* 1.42* 1.22*  CALCIUM 9.2  --  8.6* 8.7*   LFT Recent Labs    10/08/17 1632  PROT 6.0*  ALBUMIN 2.9*  AST 17  ALT 15  ALKPHOS 87  BILITOT 1.3*   PT/INR Recent Labs    10/08/17 2055  10/09/17 0312  LABPROT 16.9* 15.5*  INR 1.38 1.24    Studies/Results: Ct Abdomen Pelvis Wo Contrast  Result Date: 10/08/2017 CLINICAL DATA:  Abdominal pain with nausea and vomiting EXAM: CT ABDOMEN AND PELVIS WITHOUT CONTRAST TECHNIQUE: Multidetector CT imaging of the abdomen and pelvis was performed following the standard protocol without IV contrast. COMPARISON:  CT abdomen pelvis 07/08/2016 FINDINGS: LOWER CHEST: Large right and small left pleural effusions with associated atelectasis. HEPATOBILIARY: Normal hepatic contours and density. No intra- or extrahepatic biliary dilatation. Normal gallbladder. PANCREAS: Normal parenchymal contours without ductal dilatation. No peripancreatic fluid collection. SPLEEN: Normal. ADRENALS/URINARY TRACT: --Adrenal glands: Normal. --Right kidney/ureter: Right renal cyst now measures up to 4.4 cm, previously 3.4 cm. There are small calcifications at the renal pelvis, probably vascular. --Left kidney/ureter: Unchanged 1.8 cm left renal cyst. No hydronephrosis. --Urinary bladder: Normal for degree of distention STOMACH/BOWEL: --Stomach/Duodenum: No hiatal hernia or other gastric abnormality. Normal duodenal course. --Small bowel: No dilatation or inflammation. --Colon: There is diffuse colonic diverticulosis without acute inflammation. Moderate volume of stool in distal colon. --Appendix: Not visualized. No right lower quadrant inflammation or free fluid.  VASCULAR/LYMPHATIC: Atherosclerotic calcification is present within the non-aneurysmal abdominal aorta, without hemodynamically significant stenosis. no abdominal or pelvic lymphadenopathy. REPRODUCTIVE: Partially calcified uterine fibroid.  No adnexal mass. MUSCULOSKELETAL. Multilevel degenerative disc disease and facet arthrosis. No bony spinal canal stenosis. Unchanged L3 superior endplate Schmorl's node. OTHER: None. IMPRESSION: 1. Large right and small left pleural effusion. 2. Diffuse diverticulosis without acute  inflammation. 3.  Aortic Atherosclerosis (ICD10-I70.0). Electronically Signed   By: Deatra Robinson M.D.   On: 10/08/2017 20:20   Ct Head Wo Contrast  Result Date: 10/08/2017 CLINICAL DATA:  Altered level of consciousness EXAM: CT HEAD WITHOUT CONTRAST TECHNIQUE: Contiguous axial images were obtained from the base of the skull through the vertex without intravenous contrast. COMPARISON:  07/06/2016 FINDINGS: BRAIN: There is sulcal and ventricular prominence consistent with superficial and central atrophy. No intraparenchymal hemorrhage, mass effect nor midline shift. Periventricular and subcortical white matter hypodensities consistent with chronic small vessel ischemic disease are identified. No acute large vascular territory infarcts. No abnormal extra-axial fluid collections. Basal cisterns are not effaced and midline. VASCULAR: Moderate calcific atherosclerosis of the carotid siphons. SKULL: No skull fracture. No significant scalp soft tissue swelling. SINUSES/ORBITS: The mastoid air-cells are clear. The included paranasal sinuses are well-aerated.The included ocular globes and orbital contents are non-suspicious. OTHER: None. IMPRESSION: Atrophy with chronic small vessel ischemia. No acute intracranial abnormality. Electronically Signed   By: Tollie Eth M.D.   On: 10/08/2017 23:55   Scheduled Meds: . atorvastatin  40 mg Oral q1800  . diltiazem  300 mg Oral Daily  . insulin aspart  0-9 Units Subcutaneous Q4H  . levothyroxine  100 mcg Oral QAC breakfast  . mouth rinse  15 mL Mouth Rinse BID  . sodium chloride flush  3 mL Intravenous Q12H  . sodium chloride flush  3 mL Intravenous Q12H   Continuous Infusions: . sodium chloride     PRN Meds:.sodium chloride, ketotifen, levalbuterol, LORazepam, ondansetron **OR** ondansetron (ZOFRAN) IV, sodium chloride flush   ASSESMENT:   *   GIB.  Likely diverticular with Blood PR.  Bleeding has slowed down with reversal of Coumadin. Last 2 stools were  black, c/w old blood.    *   Chronic Macrocytic anemia.  Hgb down ~ 0.5 gm from admit level and overall improving pattern.  Not sure why she is on po iron daily especially considering issues with constipation in past. B 12 normal.  Folate level pending.    *   AKI improved.    *   Chronic Coumadin for A fib, reversed with Vit K and K centra.  paced regular rhythm on tele monitor.    *   AMS in pt with baseline dementia.  also received a dose of Fentanyl early yest AM.  Head CT showing chronic vascular changes.  TSH ok.  Mental status greatly improved, pt delightfully engaged this AM.     *  Pleural effusions.  no resting SOB or distress.    *   Hypokalemia.  Improved but persists.     PLAN   *   Resolved/resolving LGIB, suspect diverticular vs ischemic colitis, less likely but did c/o abd pain before and at time of admission.  On rectal exam had pasty reddish/brown stool, seemed more like colitis than diverticular.    *  ? If/when to restart Coumadin.  Cardiology input would be helpful.  From GI standpoint would prefer to leave pt off Coumadin.      Jennye Moccasin  10/10/2017, 10:01 AM  Phone [phone]   Attending physician's note   I have taken an interval history (no family by the bedside today, history is also not very reliable due to dementia), reviewed the chart and examined the patient. I agree with the Advanced Practitioner's note, impression and recommendations.   84 year old white female with advanced dementia, DM, CAD s/p angioplasty 04/2016, cardiac pacemaker for heart block, A Fib on Coumadin, DJD, osteoporosis with multiple compression fractures admitted with painless hematochezia.  Coumadin has been reversed.  Last colonoscopy 2007 by Dr. Jarold Motto showed extensive pancolonic diverticulosis. Plan: Continue conservative management.  I had discussed colonoscopy with the family yesterday.  They would like to hold off.  Hence, no GI intervention planned.  Trend CBC. ?   Risks versus benefits of continuing Coumadin as outpatient.  Edman Circle, MD

## 2017-10-11 DIAGNOSIS — G9340 Encephalopathy, unspecified: Secondary | ICD-10-CM

## 2017-10-11 DIAGNOSIS — D696 Thrombocytopenia, unspecified: Secondary | ICD-10-CM

## 2017-10-11 DIAGNOSIS — K922 Gastrointestinal hemorrhage, unspecified: Secondary | ICD-10-CM

## 2017-10-11 LAB — CBC
HCT: 32.5 % — ABNORMAL LOW (ref 36.0–46.0)
HEMOGLOBIN: 10.4 g/dL — AB (ref 12.0–15.0)
MCH: 32.2 pg (ref 26.0–34.0)
MCHC: 32 g/dL (ref 30.0–36.0)
MCV: 100.6 fL — AB (ref 78.0–100.0)
Platelets: 109 10*3/uL — ABNORMAL LOW (ref 150–400)
RBC: 3.23 MIL/uL — ABNORMAL LOW (ref 3.87–5.11)
RDW: 16.3 % — AB (ref 11.5–15.5)
WBC: 5 10*3/uL (ref 4.0–10.5)

## 2017-10-11 LAB — GLUCOSE, CAPILLARY
GLUCOSE-CAPILLARY: 126 mg/dL — AB (ref 65–99)
GLUCOSE-CAPILLARY: 132 mg/dL — AB (ref 65–99)
GLUCOSE-CAPILLARY: 78 mg/dL (ref 65–99)
Glucose-Capillary: 83 mg/dL (ref 65–99)
Glucose-Capillary: 87 mg/dL (ref 65–99)
Glucose-Capillary: 98 mg/dL (ref 65–99)

## 2017-10-11 LAB — BASIC METABOLIC PANEL
ANION GAP: 9 (ref 5–15)
BUN: 45 mg/dL — AB (ref 6–20)
CO2: 25 mmol/L (ref 22–32)
Calcium: 8.7 mg/dL — ABNORMAL LOW (ref 8.9–10.3)
Chloride: 103 mmol/L (ref 101–111)
Creatinine, Ser: 1.21 mg/dL — ABNORMAL HIGH (ref 0.44–1.00)
GFR calc Af Amer: 45 mL/min — ABNORMAL LOW (ref >60)
GFR, EST NON AFRICAN AMERICAN: 39 mL/min — AB (ref >60)
GLUCOSE: 80 mg/dL (ref 65–99)
POTASSIUM: 3.4 mmol/L — AB (ref 3.5–5.1)
Sodium: 137 mmol/L (ref 135–145)

## 2017-10-11 MED ORDER — LORAZEPAM 0.5 MG PO TABS: 0.5000 mg | ORAL_TABLET | Freq: Two times a day (BID) | ORAL | 0 refills | Status: DC | PRN

## 2017-10-11 MED ORDER — ENSURE ENLIVE PO LIQD
237.0000 mL | Freq: Two times a day (BID) | ORAL | Status: DC
  Administered 2017-10-11 – 2017-10-12 (×2): 237 mL via ORAL

## 2017-10-11 MED ORDER — MORPHINE SULFATE 15 MG PO TABS: 15.0000 mg | ORAL_TABLET | ORAL | 0 refills | Status: DC | PRN

## 2017-10-11 MED ORDER — WARFARIN SODIUM 2.5 MG PO TABS: 2.5000 mg | ORAL_TABLET | Freq: Every evening | ORAL | 0 refills | Status: DC

## 2017-10-11 MED ORDER — ADULT MULTIVITAMIN W/MINERALS CH
1.0000 | ORAL_TABLET | Freq: Every day | ORAL | Status: DC
  Administered 2017-10-12: 1 via ORAL
  Filled 2017-10-11: qty 1

## 2017-10-11 NOTE — Evaluation (Signed)
Physical Therapy Evaluation Patient Details Name: [name] MRN: [id number] DOB: 1927-02-04 Today's Date: 10/11/2017   History of Present Illness  84 year old female Patient was admitted to the hospital 10/08/2017 with the working diagnosis of acute lower gastrointestinal bleeding complicated by acute metabolic encephalopathy and acute kidney injury. Significant PMH for dementia, atrial fibrillation, complete heart block status post pacemaker, systolic heart failure, hypothyroidism, and depression/anxiety.  Clinical Impression  Pt admitted with above diagnosis. Pt currently with functional limitations due to the deficits listed below (see PT Problem List).  Pt eval limited d/t pt diminished cognition/tangential thoughts, difficult to redirect; pt was cooperative with sitting EOB however refused to stand or transfer to recliner because she "was going to a wedding"; pt appears to be ambulatory but no family present to determine PLOF; recommend return to SNF; will continue to follow  Pt will benefit from skilled PT to increase their independence and safety with mobility to allow discharge to the venue listed below.       Follow Up Recommendations SNF;Supervision/Assistance - 24 hour    Equipment Recommendations  None recommended by PT    Recommendations for Other Services       Precautions / Restrictions Precautions Precautions: Fall;ICD/Pacemaker Restrictions Weight Bearing Restrictions: No      Mobility  Bed Mobility Overal bed mobility: Needs Assistance Bed Mobility: Supine to Sit;Sit to Supine     Supine to sit: Supervision Sit to supine: Supervision   General bed mobility comments: pt required VC to continue progression to sit EOB, agreed to OOB, then refused and returned to supine  Transfers                 General transfer comment: unable to assess this date due to cognition; will further assess in subsequent visits  Ambulation/Gait                 Stairs            Wheelchair Mobility    Modified Rankin (Stroke Patients Only)       Balance Overall balance assessment: Needs assistance   Sitting balance-Leahy Scale: Fair Sitting balance - Comments: fair to good, pt wt shifts laterally without LOB       Standing balance comment: unable to test d/t pt cognition                             Pertinent Vitals/Pain Pain Assessment: No/denies pain    Home Living Family/patient expects to be discharged to:: Skilled nursing facility                 Additional Comments: [name] resident    Prior Function                 Hand Dominance        Extremity/Trunk Assessment        Lower Extremity Assessment Lower Extremity Assessment: Generalized weakness RLE Deficits / Details: bil LEs AROM grossly WFL; generalized muscle atrophy noted; unable to perform full testing d/t decr cognition    Cervical / Trunk Assessment Cervical / Trunk Assessment: Kyphotic  Communication   Communication: (tangential )  Cognition Arousal/Alertness: Awake/alert Behavior During Therapy: Restless Overall Cognitive Status: Impaired/Different from baseline Area of Impairment: Following commands;Attention;Orientation;Memory;Safety/judgement;Awareness;Problem solving                 Orientation Level: Disoriented to;Person;Place;Time;Situation(knows self, not Birthday) Current Attention Level: Focused Memory: Decreased short-term memory Following  Commands: Follows one step commands inconsistently Safety/Judgement: Decreased awareness of deficits;Decreased awareness of safety   Problem Solving: Slow processing;Requires verbal cues;Requires tactile cues;Difficulty sequencing General Comments: pt is unable to attend to task,  she is agreeable to mobility, then refuses      General Comments      Exercises     Assessment/Plan    PT Assessment Patient needs continued PT services  PT Problem  List Decreased mobility;Decreased safety awareness;Decreased activity tolerance;Decreased cognition       PT Treatment Interventions DME instruction;Therapeutic activities;Gait training;Functional mobility training;Therapeutic exercise;Patient/family education    PT Goals (Current goals can be found in the Care Plan section)  Acute Rehab PT Goals PT Goal Formulation: Patient unable to participate in goal setting Time For Goal Achievement: 10/25/17 Potential to Achieve Goals: Fair    Frequency Min 2X/week   Barriers to discharge        Co-evaluation               AM-PAC PT "6 Clicks" Daily Activity  Outcome Measure Difficulty turning over in bed (including adjusting bedclothes, sheets and blankets)?: A Little Difficulty moving from lying on back to sitting on the side of the bed? : A Little Difficulty sitting down on and standing up from a chair with arms (e.g., wheelchair, bedside commode, etc,.)?: A Little Help needed moving to and from a bed to chair (including a wheelchair)?: A Lot Help needed walking in hospital room?: A Lot Help needed climbing 3-5 steps with a railing? : Total 6 Click Score: 14    End of Session   Activity Tolerance: Other (comment)(limited d/t cognition) Patient left: with call bell/phone within reach;in bed(bed alarm not working)   PT Visit Diagnosis: Difficulty in walking, not elsewhere classified (R26.2)    Time: 1610-9604 PT Time Calculation (min) (ACUTE ONLY): 23 min   Charges:   PT Evaluation $PT Eval Moderate Complexity: 1 Mod PT Treatments $Therapeutic Activity: 8-22 mins   PT G CodesDrucilla Chalet, PT Pager: [phone] 10/11/2017   Drucilla Chalet 10/11/2017, 4:02 PM

## 2017-10-11 NOTE — Progress Notes (Signed)
Patient will DC to: [name] Anticipated DC date: 10/11/17 Family notified: Granddaughter, Advertising account planner by: Sharin Mons   Per MD patient ready for DC to [name]. RN, patient, patient's family, and facility notified of DC. Discharge Summary sent to facility. RN given number for report [phone] Room 715). DC packet on chart. Ambulance transport requested for patient.   CSW signing off.  Cristobal Goldmann, LCSW Clinical Social Worker [phone]

## 2017-10-11 NOTE — Consult Note (Signed)
            [facility] CM Primary Care Navigator  10/11/2017  [name] 09/06/1926 [id number]   Went to see patient at the bedside to identify possible discharge needs but RN is in the room helping to redirect patient as she was confused and trying to get up, out of bed.  Per MD note, patient presented from the skilled nursing facility with lethargy and rectal bleeding complicated by acute metabolic encephalopathy and acute kidney injury.  Per chart review, patient has been at [name] skilled nursing facility for long term care and will return back there at discharge.   For additional questions please contact:  Karin Golden A. Tryone Kille, BSN, RN-BC [facility] PRIMARY CARE Navigator Cell: [phone]

## 2017-10-11 NOTE — Progress Notes (Signed)
Per report pt to be discharged this evening and transportation scheduled via [facility]. No discharge order in place and orders on AVS not reconciled. Contacted Night shift covering provider Linton Flemings, NP). NP will place discharge order but attending physician  Acosta, MD) not available to reconcile orders. Per charge RN (G. Gleason, RN), unable to discharge patient without reconciled orders and patient to stay overnight. [facility] called and cancelled transportation. Will continue to monitor patient overnight.

## 2017-10-11 NOTE — Discharge Summary (Addendum)
Physician Discharge Summary  [name] ZOX:[id number] DOB: 04-Jan-1927 DOA: 10/08/2017  PCP: [name]  Admit date: 10/08/2017 Discharge date: 10/12/2017  Admitted From: SNF Disposition:  SNF  Recommendations for Outpatient Follow-up:  1. Follow up with PCP in 1-2 weeks 2. Repeat CBC and BMET in 1 week 3. Please resume coumadin in two weeks without bridge and allow INR to become therapeutic with goal INR of 2-3 per GI recommendations, monitoring for GI bleeding (Resume coumadin on 10/25/17)  Discharge Condition:Stable CODE STATUS:Full Diet recommendation: Heart healthy, diabetic   Brief/Interim Summary: 84 year old female who presented with lethargy and rectal bleeding. She does have the significant past medical history for dementia, atrial fibrillation, complete heart block status post pacemaker, systolic heart failure, hypothyroidism, anddepression/anxiety.Patient was noted to be nonverbal at the skilled nursing facility,withlarge amounts of red and maroon blood in her diaper.Due to acute change in her condition she was sent to the emergency room for further evaluation. On the initial physical examination blood pressure 111/61, heart rate 71, respiratory 23, oxygen saturation 98%.She was lethargic, moist mucous membranes, lungs were clear to auscultation bilaterally, heart S1-S2 present rhythmic, abdomen was soft nontender, lower extremities with no edema, positive soft mobile mass at the lateral distalaspect of her right leg.Sodium 141, potassium 3.3, chloride 100, bicarb 29, glucose 150, BUN 63, creatinine 1.69, white count 7.6, hemoglobin 11.3, hematocrit 35.4, platelets 107,INR 2.4.CT of the abdomen with diffuse diverticulosis without acute inflammation. Head CT with atrophy, no acute changes. EKG paced rhythm with premature ventricular complexes.  Patient was admitted to the hospital withtheworking diagnosis of acute lower gastrointestinal  bleeding complicated by acute metabolic encephalopathy and acute kidney injury.  1.Acute lower GI bleed. Likely diverticular bleed, will continue close monitoring of hb and hct. No current indication for PRBC transfusion. No plans for invasive workup per GI.  Question risk versus benefits of continuing Coumadin as outpatient noted.Cardiology was consulted. Determined benefits to anticoagulation outweighs risks. See below  2.Metabolic encephalopathy. Has improved with supportive medical therapy, will continue neuro checks per unit protocol. Advance diet as tolerated.  Avoid overmedication of sedating medicines.  Appears conversant and appropriate this morning.  3.Acute kidney injury.  Reticulocyte the nephrotoxic agents currently on hold.  Renal function has since improved.  Recommend repeat renal panel in 1 week  4.Atrial fibrillation,chronic. Rate controlled with diltiazem 300 mg daily.  Patient had been off of anticoagulation given concerns for acute blood loss anemia per above this admit. Per Cardiology, recommendation to continue with anticoagulation per GI recs. Discussed with GI, Dr. Chales Abrahams. Per GI, recommendation to resume coumadin in 2 weeks without bridge with goal INR of 2-3, monitoring closely for bleeding  5.Systolic heart failure, chronic and stable. Diuretics were briefly held. To resume lasix as outpatient. Please ensure good PO intake. Low threshold for decreasing diuretic dose if pt noted to have limited PO intake  6.Hypothyroidism. Continue levothyroxine patient tolerates  7.Dementia. Patient seems somewhat confused although she does converse appropriately.  Per nursing staff, patient's mentation seems much improved overnight.   8. T2DM.  Metformin had been on hold while in hospital.  Resume meds on discharge  Discharge Diagnoses:  Principal Problem:   Acute GI bleeding Active Problems:   Hypothyroidism   Diabetes mellitus with neuropathy (HCC)    Anxiety state   Essential hypertension   ATRIAL FIBRILLATION   Chronic combined systolic and diastolic CHF (congestive heart failure) (HCC)   Renal insufficiency   CHB (complete heart block) (HCC) post AVN  ablation   Pleural effusion    Discharge Instructions   Allergies as of 10/12/2017      Reactions   Codeine Sulfate Shortness Of Breath, Other (See Comments)   Felt funny   Simvastatin Other (See Comments)    choking, acid reflux      Medication List    STOP taking these medications   methocarbamol 500 MG tablet Commonly known as:  ROBAXIN   metolazone 2.5 MG tablet Commonly known as:  ZAROXOLYN   traMADol 50 MG tablet Commonly known as:  ULTRAM     TAKE these medications   atorvastatin 40 MG tablet Commonly known as:  LIPITOR Take 1 tablet (40 mg total) by mouth daily at 6 PM. What changed:  when to take this   bisacodyl 10 MG suppository Commonly known as:  DULCOLAX Place 10 mg rectally daily as needed for severe constipation.   CARTIA XT 300 MG 24 hr capsule Generic drug:  diltiazem take 1 capsule by mouth once daily   docusate sodium 100 MG capsule Commonly known as:  COLACE Take 100 mg by mouth 2 (two) times daily as needed (constipation).   ferrous sulfate 325 (65 FE) MG tablet take 1 tablet by mouth once daily   furosemide 40 MG tablet Commonly known as:  LASIX take 1 tablet by mouth once daily What changed:    how much to take  how to take this  when to take this   hydrOXYzine 10 MG tablet Commonly known as:  ATARAX/VISTARIL Take 10 mg by mouth 2 (two) times daily. For anxiety/itching (8a and 4p)   hyoscyamine 0.375 MG 12 hr tablet Commonly known as:  LEVBID take 1 tablet by mouth twice a day ONLY IF NEEDED  **DO NOT TAKE EVERYDAY MUST LAST 30 DAYS** What changed:  See the new instructions.   ketotifen 0.025 % ophthalmic solution Commonly known as:  ZADITOR Place 1 drop into both eyes 2 (two) times daily as needed (itching).    levalbuterol 0.63 MG/3ML nebulizer solution Commonly known as:  XOPENEX Take 0.63 mg by nebulization every 6 (six) hours as needed for wheezing or shortness of breath.   levothyroxine 100 MCG tablet Commonly known as:  SYNTHROID, LEVOTHROID Take 100 mcg by mouth daily before breakfast.   LORazepam 0.5 MG tablet Commonly known as:  ATIVAN Take 1 tablet (0.5 mg total) by mouth 2 (two) times daily as needed for anxiety.   Melatonin 3 MG Tabs Take 6 mg by mouth at bedtime.   metFORMIN 500 MG tablet Commonly known as:  GLUCOPHAGE Take 500 mg by mouth daily with breakfast.   morphine 15 MG tablet Commonly known as:  MSIR Take 1 tablet (15 mg total) by mouth every 4 (four) hours as needed for severe pain.   NUTRITIONAL SUPPLEMENT PO Take 120 mLs by mouth 3 (three) times daily. "Med Pass"   OXYGEN Inhale 2 L into the lungs as needed (shortness of breath).   polyethylene glycol powder powder Commonly known as:  GLYCOLAX/MIRALAX Take 17 g by mouth daily. Mix in liquid and drink   potassium chloride SA 20 MEQ tablet Commonly known as:  K-DUR,KLOR-CON Take 20 mEq by mouth daily.   SENNA S 8.6-50 MG tablet Generic drug:  senna-docusate Take 1 tablet by mouth daily as needed (constipation).   sertraline 100 MG tablet Commonly known as:  ZOLOFT Take 100 mg by mouth daily.   warfarin 2.5 MG tablet Commonly known as:  COUMADIN Take 1 tablet (2.5  mg total) by mouth every evening. Please start on 10/25/17 Start taking on:  10/25/2017 What changed:    additional instructions  These instructions start on 10/25/2017. If you are unsure what to do until then, ask your doctor or other care provider.      Contact information for after-discharge care    Destination    [facility] .   Service:  Skilled Nursing Contact information: [address] [place] [address] [phone]             Allergies  Allergen Reactions  . Codeine  Sulfate Shortness Of Breath and Other (See Comments)    Felt funny  . Simvastatin Other (See Comments)     choking, acid reflux    Consultations:  GI  Cardiology  Procedures/Studies: Ct Abdomen Pelvis Wo Contrast  Result Date: 10/08/2017 CLINICAL DATA:  Abdominal pain with nausea and vomiting EXAM: CT ABDOMEN AND PELVIS WITHOUT CONTRAST TECHNIQUE: Multidetector CT imaging of the abdomen and pelvis was performed following the standard protocol without IV contrast. COMPARISON:  CT abdomen pelvis 07/08/2016 FINDINGS: LOWER CHEST: Large right and small left pleural effusions with associated atelectasis. HEPATOBILIARY: Normal hepatic contours and density. No intra- or extrahepatic biliary dilatation. Normal gallbladder. PANCREAS: Normal parenchymal contours without ductal dilatation. No peripancreatic fluid collection. SPLEEN: Normal. ADRENALS/URINARY TRACT: --Adrenal glands: Normal. --Right kidney/ureter: Right renal cyst now measures up to 4.4 cm, previously 3.4 cm. There are small calcifications at the renal pelvis, probably vascular. --Left kidney/ureter: Unchanged 1.8 cm left renal cyst. No hydronephrosis. --Urinary bladder: Normal for degree of distention STOMACH/BOWEL: --Stomach/Duodenum: No hiatal hernia or other gastric abnormality. Normal duodenal course. --Small bowel: No dilatation or inflammation. --Colon: There is diffuse colonic diverticulosis without acute inflammation. Moderate volume of stool in distal colon. --Appendix: Not visualized. No right lower quadrant inflammation or free fluid. VASCULAR/LYMPHATIC: Atherosclerotic calcification is present within the non-aneurysmal abdominal aorta, without hemodynamically significant stenosis. no abdominal or pelvic lymphadenopathy. REPRODUCTIVE: Partially calcified uterine fibroid.  No adnexal mass. MUSCULOSKELETAL. Multilevel degenerative disc disease and facet arthrosis. No bony spinal canal stenosis. Unchanged L3 superior endplate Schmorl's  node. OTHER: None. IMPRESSION: 1. Large right and small left pleural effusion. 2. Diffuse diverticulosis without acute inflammation. 3.  Aortic Atherosclerosis (ICD10-I70.0). Electronically Signed   By: Deatra Robinson M.D.   On: 10/08/2017 20:20   Ct Head Wo Contrast  Result Date: 10/08/2017 CLINICAL DATA:  Altered level of consciousness EXAM: CT HEAD WITHOUT CONTRAST TECHNIQUE: Contiguous axial images were obtained from the base of the skull through the vertex without intravenous contrast. COMPARISON:  07/06/2016 FINDINGS: BRAIN: There is sulcal and ventricular prominence consistent with superficial and central atrophy. No intraparenchymal hemorrhage, mass effect nor midline shift. Periventricular and subcortical white matter hypodensities consistent with chronic small vessel ischemic disease are identified. No acute large vascular territory infarcts. No abnormal extra-axial fluid collections. Basal cisterns are not effaced and midline. VASCULAR: Moderate calcific atherosclerosis of the carotid siphons. SKULL: No skull fracture. No significant scalp soft tissue swelling. SINUSES/ORBITS: The mastoid air-cells are clear. The included paranasal sinuses are well-aerated.The included ocular globes and orbital contents are non-suspicious. OTHER: None. IMPRESSION: Atrophy with chronic small vessel ischemia. No acute intracranial abnormality. Electronically Signed   By: Tollie Eth M.D.   On: 10/08/2017 23:55    Subjective: No complaints  Discharge Exam: Vitals:   10/12/17 0325 10/12/17 0800  BP: (!) 110/59 (!) 113/56  Pulse:  71  Resp: 15 14  Temp:  98.5 F (  36.9 C)  SpO2:  95%   Vitals:   10/11/17 2325 10/12/17 0325 10/12/17 0500 10/12/17 0800  BP: (!) 103/55 (!) 110/59  (!) 113/56  Pulse:    71  Resp: Temp:    98.5 F (36.9 C)  TempSrc:    Oral  SpO2:    95%  Weight:   45.4 kg (100 lb)   Height:        General: Pt is alert, awake, not in acute distress Cardiovascular: RRR,  S1/S2 +, no rubs, no gallops Respiratory: CTA bilaterally, no wheezing, no rhonchi Abdominal: Soft, NT, ND, bowel sounds + Extremities: no edema, no cyanosis   The results of significant diagnostics from this hospitalization (including imaging, microbiology, ancillary and laboratory) are listed below for reference.     Microbiology: Recent Results (from the past 240 hour(s))  Urine culture     Status: None   Collection Time: 10/08/17 12:38 AM  Result Value Ref Range Status   Specimen Description URINE, CATHETERIZED  Final   Special Requests NONE  Final   Culture   Final    NO GROWTH Performed at [facility], [address], [place], [place] [address]    Report Status 10/10/2017 FINAL  Final  MRSA PCR Screening     Status: None   Collection Time: 10/09/17  6:35 PM  Result Value Ref Range Status   MRSA by PCR NEGATIVE NEGATIVE Final    Comment:        The GeneXpert MRSA Assay (FDA approved for NASAL specimens only), is one component of a comprehensive MRSA colonization surveillance program. It is not intended to diagnose MRSA infection nor to guide or monitor treatment for MRSA infections. Performed at [facility], [address] [place], [place] [address]      Labs: BNP (last 3 results) No results for input(s): BNP in the last 8760 hours. Basic Metabolic Panel: Recent Labs  Lab 10/08/17 1632 10/08/17 1702 10/09/17 0312 10/09/17 1150 10/10/17 0402 10/11/17 0319  NA 141 140  --  142 137 137  K 3.3* 3.5  --  2.7* 3.4* 3.4*  CL 100* 98*  --  106 103 103  CO2 29  --   --  GLUCOSE 150* 143*  --  104* 88 80  BUN 63* 65*  --  58* 51* 45*  CREATININE 1.69* 1.50*  --  1.42* 1.22* 1.21*  CALCIUM 9.2  --   --  8.6* 8.7* 8.7*  MG  --   --  2.4  --   --   --    Liver Function Tests: Recent Labs  Lab 10/08/17 1632  AST 17  ALT 15  ALKPHOS 87  BILITOT 1.3*  PROT 6.0*  ALBUMIN 2.9*   No results for input(s): LIPASE, AMYLASE in the last  168 hours. No results for input(s): AMMONIA in the last 168 hours. CBC: Recent Labs  Lab 10/08/17 1632 10/08/17 1702 10/08/17 2120 10/08/17 2222 10/09/17 1150 10/10/17 0402 10/11/17 0319  WBC 7.6  --  8.3  --   --  6.7 5.0  HGB 11.3* 12.2 10.5*  --  10.3* 10.9* 10.4*  HCT 35.4* 36.0 33.4* 35.5 33.1* 34.8* 32.5*  MCV 100.3*  --  100.6*  --   --  102.4* 100.6*  PLT 107*  --  108*  --   --  106* 109*   Cardiac Enzymes: No results for input(s): CKTOTAL, CKMB,  CKMBINDEX, TROPONINI in the last 168 hours. BNP: Invalid input(s): POCBNP CBG: Recent Labs  Lab 10/11/17 1747 10/11/17 2031 10/12/17 0008 10/12/17 0418 10/12/17 0750  GLUCAP 126* 132* 98 105* 95   D-Dimer No results for input(s): DDIMER in the last 72 hours. Hgb A1c No results for input(s): HGBA1C in the last 72 hours. Lipid Profile No results for input(s): CHOL, HDL, LDLCALC, TRIG, CHOLHDL, LDLDIRECT in the last 72 hours. Thyroid function studies No results for input(s): TSH, T4TOTAL, T3FREE, THYROIDAB in the last 72 hours.  Invalid input(s): FREET3 Anemia work up Recent Labs    10/09/17 1150  VITAMINB12 643   Urinalysis    Component Value Date/Time   COLORURINE YELLOW 10/09/2017 0038   APPEARANCEUR CLEAR 10/09/2017 0038   LABSPEC 1.010 10/09/2017 0038   PHURINE 7.0 10/09/2017 0038   GLUCOSEU NEGATIVE 10/09/2017 0038   GLUCOSEU NEGATIVE 04/04/2006 1156   HGBUR NEGATIVE 10/09/2017 0038   HGBUR negative 11/03/2009 1400   BILIRUBINUR NEGATIVE 10/09/2017 0038   BILIRUBINUR n 05/24/2016 1250   KETONESUR NEGATIVE 10/09/2017 0038   PROTEINUR NEGATIVE 10/09/2017 0038   UROBILINOGEN 1.0 05/24/2016 1250   UROBILINOGEN 0.2 03/19/2014 1148   NITRITE NEGATIVE 10/09/2017 0038   LEUKOCYTESUR NEGATIVE 10/09/2017 0038   Sepsis Labs Invalid input(s): PROCALCITONIN,  WBC,  LACTICIDVEN Microbiology Recent Results (from the past 240 hour(s))  Urine culture     Status: None   Collection Time: 10/08/17 12:38 AM   Result Value Ref Range Status   Specimen Description URINE, CATHETERIZED  Final   Special Requests NONE  Final   Culture   Final    NO GROWTH Performed at [facility], [address], [place], [place] [address]    Report Status 10/10/2017 FINAL  Final  MRSA PCR Screening     Status: None   Collection Time: 10/09/17  6:35 PM  Result Value Ref Range Status   MRSA by PCR NEGATIVE NEGATIVE Final    Comment:        The GeneXpert MRSA Assay (FDA approved for NASAL specimens only), is one component of a comprehensive MRSA colonization surveillance program. It is not intended to diagnose MRSA infection nor to guide or monitor treatment for MRSA infections. Performed at [facility], [address], [place], [place] [address]     Time spent:  SIGNED:   Rickey Barbara, MD  [facility] 10/12/2017, 8:38 AM  If 7PM-7AM, please contact night-coverage [email or web address] Password [email or web address]

## 2017-10-11 NOTE — Progress Notes (Signed)
Initial Nutrition Assessment  DOCUMENTATION CODES:   Non-severe (moderate) malnutrition in context of chronic illness  INTERVENTION:  Ensure Enlive po BID, each supplement provides 350 kcal and 20 grams of protein  MVI w/ Minerals  NUTRITION DIAGNOSIS:   Moderate Malnutrition related to chronic illness as evidenced by moderate muscle depletion, severe muscle depletion, moderate fat depletion.  GOAL:   Patient will meet greater than or equal to 90% of their needs  MONITOR:   PO intake, Labs, Supplement acceptance, Weight trends  REASON FOR ASSESSMENT:   Malnutrition Screening Tool    ASSESSMENT:   84 year old female who presented with lethargy and rectal bleeding.  She does have the significant past medical history for dementia, atrial fibrillation, complete heart block status post pacemaker, systolic heart failure, hypothyroidism, and depression/anxiety.  Patient was noted to be nonverbal at the skilled nursing facility,with large amounts of red and maroon blood in her diaper.  Found with acute lower GI bleed likely diverticular in nature  RD drawn to patient for positive MST. Spoke with patient at bedside but she has a history of dementia, some responses to questions were appropriate, some were not. She could not recall what she normally eats on a daily basis.  Per RN she ate 75% of breakfast this morning, had a scrambled egg sub with [place] bacon and a muffin.  Patient reports losing weight from 160 pounds to 112 pounds over 2-2.5 months. Per chart it appears she was 145 pounds 06/2016, 143 pounds 09/19/2016 and 112 pounds 10/11/2017. No recent weights available. Unsure of accuracy of weight history.  Ordered supplements for patient. Unable to diagnose severe malnutrition based on information provided, but patient does meet criteria for moderate malnutrition. Will monitor PO intake.  Labs reviewed:  K+ 3.4, BUN/Creatinine 45/1.21 Medications reviewed and include:   Insulin  NUTRITION - FOCUSED PHYSICAL EXAM:    Most Recent Value  Orbital Region  Moderate depletion  Upper Arm Region  Severe depletion  Thoracic and Lumbar Region  Moderate depletion  Buccal Region  Mild depletion  Temple Region  Mild depletion  Clavicle Bone Region  Severe depletion  Clavicle and Acromion Bone Region  Severe depletion  Scapular Bone Region  Unable to assess  Dorsal Hand  Severe depletion  Patellar Region  Severe depletion  Anterior Thigh Region  Severe depletion  Posterior Calf Region  Severe depletion       Diet Order:   Diet Order           Diet heart healthy/carb modified Room service appropriate? Yes; Fluid consistency: Thin  Diet effective now          EDUCATION NEEDS:   Not appropriate for education at this time  Skin:  Skin Assessment: Reviewed RN Assessment(generalized ecchymosis to her L side)  Last BM:  10/09/2017  Height:   Ht Readings from Last 1 Encounters:  10/09/17  (1.626 m)    Weight:   Wt Readings from Last 1 Encounters:  10/11/17 112 lb (50.8 kg)    Ideal Body Weight:  54.54 kg  BMI:  Body mass index is 19.22 kg/m.  Estimated Nutritional Needs:   Kcal:  1200-1400 calories (MSJ x1.3-1.5)  Protein:  66-81 grams (1.3-1.6g/kg)  Fluid:  >1.5L   Dionne Ano. Tylee Yum, MS, RD LDN Inpatient Clinical Dietitian Pager [phone]

## 2017-10-11 NOTE — Progress Notes (Addendum)
Daily Rounding Note  10/11/2017, 8:13 AM  LOS: 3 days   SUBJECTIVE:   Chief complaint:     Small black stool ~ 5 PM yesterday.  Today's RN received report of smears of dark/reddish stool.   Pt denies abd pain and nausea.     OBJECTIVE:         Vital signs in last 24 hours:    Temp:  [98.4 F (36.9 C)-98.8 F (37.1 C)] 98.4 F (36.9 C) (05/23 0800) Pulse Rate:  [69-84] 70 (05/23 0459) Resp:  [15-25] 16 (05/23 0459) BP: (103-112)/(55-73) 112/58 (05/23 0459) SpO2:  [92 %-100 %] 93 % (05/23 0459) Weight:  [112 lb (50.8 kg)] 112 lb (50.8 kg) (05/23 0452) Last BM Date: 10/09/17 Filed Weights   10/09/17 1710 10/10/17 0356 10/11/17 0452  Weight: 107 lb 9.4 oz (48.8 kg) 110 lb (49.9 kg) 112 lb (50.8 kg)   General: Alert, appropriate.     Heart: reg, paced rhythm with murmer Chest: clear bil.  No labored breathing Abdomen: soft, NT, ND.  Active BS.    Extremities: no CCE Neuro/Psych:  Follows commands.  Resting but awakened easily.    Intake/Output from previous day: 05/22 0701 - 05/23 0700 In: 340 [P.O.:340] Out: -   Intake/Output this shift: No intake/output data recorded.  Lab Results: Recent Labs    10/08/17 2120  10/09/17 1150 10/10/17 0402 10/11/17 0319  WBC 8.3  --   --  6.7 5.0  HGB 10.5*  --  10.3* 10.9* 10.4*  HCT 33.4*   < > 33.1* 34.8* 32.5*  PLT 108*  --   --  106* 109*   < > = values in this interval not displayed.   BMET Recent Labs    10/09/17 1150 10/10/17 0402 10/11/17 0319  NA 142 137 137  K 2.7* 3.4* 3.4*  CL 106 103 103  CO2 GLUCOSE 104* 88 80  BUN 58* 51* 45*  CREATININE 1.42* 1.22* 1.21*  CALCIUM 8.6* 8.7* 8.7*   LFT Recent Labs    10/08/17 1632  PROT 6.0*  ALBUMIN 2.9*  AST 17  ALT 15  ALKPHOS 87  BILITOT 1.3*   PT/INR Recent Labs    10/08/17 2055 10/09/17 0312  LABPROT 16.9* 15.5*  INR 1.38 1.24    Studies/Results: No results found.    Scheduled Meds: . atorvastatin  40 mg Oral q1800  . diltiazem  300 mg Oral Daily  . insulin aspart  0-9 Units Subcutaneous Q4H  . levothyroxine  100 mcg Oral QAC breakfast  . mouth rinse  15 mL Mouth Rinse BID  . sodium chloride flush  3 mL Intravenous Q12H  . sodium chloride flush  3 mL Intravenous Q12H   Continuous Infusions: . sodium chloride     PRN Meds:.sodium chloride, ketotifen, levalbuterol, LORazepam, magic mouthwash, morphine, ondansetron **OR** ondansetron (ZOFRAN) IV, sodium chloride flush   ASSESMENT:   * GIB. Likely diverticular with Blood PR. Bleeding has slowed down with reversal of Coumadin. Last 2 stools were black, c/w old blood.  Family prefers no colonoscopy and it is not clearly indicated or needed.    * Chronic Macrocytic anemia.  Hgb stable.  ? Need for po iron, used as outpt.  No labs indicated iron deficiency.   B 12 normal, folate elevated.    *  AKI steadily improved.  ? Baseline.  At present CKD stage 3b.     *  Thrombocytopenia, non-critical.      * Chronic Coumadin for A fib, reversed with Vit K and K centra. paced regular rhythm on tele monitor.    * Dementia. Worse AMS in setting of GIB and Fentanyl.  Much improved within 24 hours.       * Pleural effusions. no resting SOB or distress.    *   Hypokalemia.  Overall improved but persists. Received IV runs and po supplement.     *  DM 2, oral agent alone PTA.     PLAN   *  Ok to discharge from GI standpoint.  The dilemma, question is when/if to restart Coumadin.  Cardiology input would be helpful.  From GI standpoint would prefer to leave pt off Coumadin.   Signing off, available PRN.   Jennye Moccasin  10/11/2017, 8:13 AM Phone [phone]   Attending physician's note   I have taken an interval history, reviewed the chart and examined the patient. I agree with the Advanced Practitioner's note, impression and recommendations.  Discussed with the patient's  family.  84 year old white female with advanced dementia, DM, CAD s/pangioplasty 04/2016, cardiac pacemaker for heart block, A Fibon Coumadin, DJD,osteoporosis with multiple compression fractures admitted with painless hematochezia.Coumadin has been reversed.Last colonoscopy 2007 by Dr. Jarold Motto showed extensive pancolonic diverticulosis. Neg CT this adm except for diffuse colonic diverticulosis and constipation. Hb stable 10-11 at baseline. Patient and patient's family would like to hold off on colonoscopy.  She likely had a diverticular bleed or colitis.    Cardiology would like to start her back on Coumadin due to risk of CVA with high CHADS score..  Patient at high risk for recurrent GI bleeding and falls.  This is no ideal situation.   Can re-start Coumadin in 2 weeks and maintain INR at the lowest therapeutic level (preferably around 2).  Watch for recurrent bleeding.  Discussed above with Dr. Rhona Leavens.  Trend CBC weekly for 2 weeks after restarting Coumadin.  Miralax for constipation.   Edman Circle, MD

## 2017-10-11 NOTE — Care Management Important Message (Signed)
Important Message  Patient Details  Name: [name] DOBIS MRN: [id number] Date of Birth: January 27, 1927   Medicare Important Message Given:  Yes    Alisan Dokes Stefan Church 10/11/2017, 12:16 PM

## 2017-10-11 NOTE — Consult Note (Addendum)
Cardiology Consultation:   Patient ID: [name]; [id number]; 1926/09/18   Admit date: 10/08/2017 Date of Consult: 10/11/2017  Primary Care Provider: [name] Primary Cardiologist: Lance Muss, MD Primary Electrophysiologist:  None   Patient Profile:   [name] is a 83 y.o. female with a PMH of CAD s/p NSTEMI with PCI/DES to Cx and LAD, permanent atrial fibrillation on coumadin, chronic combined CHF, s/p PPM, diverticulitis, hypothyroidism, depression, and anxiety who is being seen today for the evaluation of anticoagulation recommendations at the request of Dr. Rhona Leavens.  History of Present Illness:   [name] was in her usual state of health until 10/08/17 when the family noted bright red bowel movements associated with increased confusion and lethargy throughout the day. She was admitted to medicine for management of acute GI bleed with GI following for recommendations. Given chronic anticoagulation with coumadin for atrial fibrillation, vitamin K and K centra were administered in the ED.   She was last seen by Dr. Eldridge Dace 07/2016 in follow-up of NSTEMI admission 03/2016 where she had Cx stent placed and subsequent LAD stent placed after she was turned down for CABG. She was doing well from a cardiac standpoint at that visit, however was noted to have fallen multiple times in the last several months. She was last evaluated by Dr. Ladona Ridgel (EP) 09/2017 for routine device check and follow-up of her chronic atrial fibrillation. She was thought to be doing well and was still without anginal complaints. Device interrogated and functioning normally. He last echocardiogram 03/2016 with EF 40-45% and diastolic dysfunction.  At the time of evaluation, patient is pleasantly demented and unable to participate in history taking. Daughter at bedside, visiting from [place], is unaware of any recent cardiac complaints. She does note that the patient has had several  falls at her nursing home in the past few months. Spoke to granddaughter, [name], on the phone who understands the risks and benefits of anticoagulation.   Hospital course: VSS. Labs notable for hypokalemia (2.7>3.4>3.4), BUN elevated, Cr 1.69>1.21 (baseline), Hgb 11.3>10.9>10.4, PLT 106, INR was therapeutic at 2.41 on admission, down to 1.24 after Vitamin K and K centra. CT A/P with large right and small left pleural effusion, diffuse diverticulosis without inflammation, and aortic atherosclerosis. Ct Head with chronic small vessel ischemia. GI followed and thought bleed likely 2/2 diverticulosis, recommending no invasive testing this admission. Still with black stools noted yesterday. Cardiology asked to evaluate for recommendations regarding ongoing anticoagulation.   Past Medical History:  Diagnosis Date  . Anxiety   . Atrial fibrillation (HCC)    chronic Coumadin  . CHF (congestive heart failure) (HCC)   . Constipation    Rx'd Amitiza in 2007.    . Diverticulosis of colon    hx of diverticulitis.  dense, pan-diverticulosis on colonoscopies 2001, 2007.    . DM (diabetes mellitus) (HCC)   . Hyperlipidemia   . Hypertension   . Hypothyroidism   . Osteoporosis   . Tremor, essential   . Type II or unspecified type diabetes mellitus with neurological manifestations, not stated as uncontrolled(250.60)     Past Surgical History:  Procedure Laterality Date  . CARDIAC CATHETERIZATION N/A 04/20/2016   Procedure: Left Heart Cath and Coronary Angiography;  Surgeon: Yvonne Kendall, MD;  Location: [facility] INVASIVE CV LAB;  Service: Cardiovascular;  Laterality: N/A;  . CARDIAC CATHETERIZATION N/A 04/24/2016   Procedure: Coronary Stent Intervention;  Surgeon: Lennette Bihari, MD;  Location: [facility] INVASIVE CV LAB;  Service:  Cardiovascular;  Laterality: N/A;  . CARDIAC CATHETERIZATION N/A 04/24/2016   Procedure: Coronary Balloon Angioplasty;  Surgeon: Lennette Bihari, MD;  Location: [facility] INVASIVE CV LAB;   Service: Cardiovascular;  Laterality: N/A;  . CARDIAC CATHETERIZATION N/A 04/26/2016   Procedure: Coronary Stent Intervention;  Surgeon: Iran Ouch, MD;  Location: [facility] INVASIVE CV LAB;  Service: Cardiovascular;  Laterality: N/A;  . CATARACT EXTRACTION  2002  . IR THORACENTESIS ASP PLEURAL SPACE W/IMG GUIDE  06/07/2017  . L3 compression fraction    . PACEMAKER PLACEMENT    . TRANSTHORACIC ECHOCARDIOGRAM  2011, 2012     Home Medications:  Prior to Admission medications   Medication Sig Start Date End Date Taking? Authorizing Provider  atorvastatin (LIPITOR) 40 MG tablet Take 1 tablet (40 mg total) by mouth daily at 6 PM. Patient taking differently: Take 40 mg by mouth every evening.  05/01/16  Yes Arty Baumgartner, NP  bisacodyl (DULCOLAX) 10 MG suppository Place 10 mg rectally daily as needed for severe constipation.   Yes [provider]  CARTIA XT 300 MG 24 hr capsule take 1 capsule by mouth once daily 03/09/16  Yes Gordy Savers, MD  docusate sodium (COLACE) 100 MG capsule Take 100 mg by mouth 2 (two) times daily as needed (constipation).   Yes [provider]  ferrous sulfate 325 (65 FE) MG tablet take 1 tablet by mouth once daily 03/31/16  Yes Gordy Savers, MD  furosemide (LASIX) 40 MG tablet take 1 tablet by mouth once daily Patient taking differently: take 1 tablet by mouth twice daily 06/12/16  Yes Gordy Savers, MD  hydrOXYzine (ATARAX/VISTARIL) 10 MG tablet Take 10 mg by mouth 2 (two) times daily. For anxiety/itching (8a and 4p)   Yes [provider]  hyoscyamine (LEVBID) 0.375 MG 12 hr tablet take 1 tablet by mouth twice a day ONLY IF NEEDED  **DO NOT TAKE EVERYDAY MUST LAST 30 DAYS** Patient taking differently: take 1 tablet by mouth twice daily as needed for diverticulosis 12/20/15  Yes Gordy Savers, MD  ketotifen (ZADITOR) 0.025 % ophthalmic solution Place 1 drop into both eyes 2 (two) times daily as needed (itching).    Yes [provider]  levalbuterol (XOPENEX) 0.63 MG/3ML nebulizer solution Take 0.63 mg by nebulization every 6 (six) hours as needed for wheezing or shortness of breath.   Yes [provider]  levothyroxine (SYNTHROID, LEVOTHROID) 100 MCG tablet Take 100 mcg by mouth daily before breakfast.   Yes [provider]  LORazepam (ATIVAN) 0.5 MG tablet Take 0.5 mg by mouth 2 (two) times daily as needed for anxiety.   Yes [provider]  Melatonin 3 MG TABS Take 6 mg by mouth at bedtime.   Yes [provider]  metFORMIN (GLUCOPHAGE) 500 MG tablet Take 500 mg by mouth daily with breakfast.   Yes [provider]  methocarbamol (ROBAXIN) 500 MG tablet Take 500 mg by mouth 3 (three) times daily. For back pain   Yes [provider]  metolazone (ZAROXOLYN) 2.5 MG tablet Take 2.5 mg by mouth See admin instructions. Take 1 tablet (2.5 mg) by mouth daily on Monday and Thursday   Yes [provider]  morphine (MSIR) 15 MG tablet Take 1 tablet (15 mg total) by mouth every 4 (four) hours as needed for severe pain. 07/08/16  Yes Melene Plan, DO  Nutritional Supplements (NUTRITIONAL SUPPLEMENT PO) Take 120 mLs by mouth 3 (three) times daily. "Med Pass"  Yes [provider]  OXYGEN Inhale 2 L into the lungs as needed (shortness of breath).   Yes [provider]  polyethylene glycol powder (GLYCOLAX/MIRALAX) powder Take 17 g by mouth daily. Mix in liquid and drink   Yes [provider]  potassium chloride SA (K-DUR,KLOR-CON) 20 MEQ tablet Take 20 mEq by mouth daily.   Yes [provider]  senna-docusate (SENNA S) 8.6-50 MG tablet Take 1 tablet by mouth daily as needed (constipation).   Yes [provider]  sertraline (ZOLOFT) 100 MG tablet Take 100 mg by mouth daily.   Yes [provider]  traMADol (ULTRAM) 50 MG tablet Take 1 tablet (50 mg total) by mouth every 6 (six) hours as needed for moderate  pain. 10/26/15  Yes Nelwyn Salisbury, MD  warfarin (COUMADIN) 2.5 MG tablet Take 2.5 mg by mouth every evening.   Yes [provider]    Inpatient Medications: Scheduled Meds: . atorvastatin  40 mg Oral q1800  . diltiazem  300 mg Oral Daily  . feeding supplement (ENSURE ENLIVE)  237 mL Oral BID BM  . insulin aspart  0-9 Units Subcutaneous Q4H  . levothyroxine  100 mcg Oral QAC breakfast  . mouth rinse  15 mL Mouth Rinse BID  . multivitamin with minerals  1 tablet Oral Daily  . sodium chloride flush  3 mL Intravenous Q12H  . sodium chloride flush  3 mL Intravenous Q12H   Continuous Infusions: . sodium chloride     PRN Meds: sodium chloride, ketotifen, levalbuterol, LORazepam, magic mouthwash, morphine, ondansetron **OR** ondansetron (ZOFRAN) IV, sodium chloride flush  Allergies:    Allergies  Allergen Reactions  . Codeine Sulfate Shortness Of Breath and Other (See Comments)    Felt funny  . Simvastatin Other (See Comments)     choking, acid reflux    Social History:   Social History   Socioeconomic History  . Marital status: Widowed    Spouse name: Not on file  . Number of children: Not on file  . Years of education: Not on file  . Highest education level: Not on file  Occupational History  . Not on file  Social Needs  . Financial resource strain: Not on file  . Food insecurity:    Worry: Not on file    Inability: Not on file  . Transportation needs:    Medical: Not on file    Non-medical: Not on file  Tobacco Use  . Smoking status: Never Smoker  . Smokeless tobacco: Never Used  Substance and Sexual Activity  . Alcohol use: No    Alcohol/week: 0.0 oz  . Drug use: No  . Sexual activity: Not on file  Lifestyle  . Physical activity:    Days per week: Not on file    Minutes per session: Not on file  . Stress: Not on file  Relationships  . Social connections:    Talks on phone: Not on file    Gets together: Not on file    Attends religious service:  Not on file    Active member of club or organization: Not on file    Attends meetings of clubs or organizations: Not on file    Relationship status: Not on file  . Intimate partner violence:    Fear of current or ex partner: Not on file    Emotionally abused: Not on file    Physically abused: Not on file    Forced sexual activity: Not on file  Other Topics Concern  . Not on file  Social History Narrative  . Not on file    Family History:    Family History  Problem Relation Age of Onset  . Heart attack Father   . Colon cancer Neg Hx      ROS:  Please see the history of present illness.   All other ROS reviewed and negative.     Physical Exam/Data:   Vitals:   10/11/17 0452 10/11/17 0459 10/11/17 0800 10/11/17 1049  BP:  (!) 112/58  132/60  Pulse:  70    Resp:  16    Temp: 98.4 F (36.9 C)  98.4 F (36.9 C)   TempSrc: Oral  Oral   SpO2:  93%    Weight: 112 lb (50.8 kg)     Height:        Intake/Output Summary (Last 24 hours) at 10/11/2017 1447 Last data filed at 10/11/2017 1054 Gross per 24 hour  Intake 103 ml  Output -  Net 103 ml   Filed Weights   10/09/17 1710 10/10/17 0356 10/11/17 0452  Weight: 107 lb 9.4 oz (48.8 kg) 110 lb (49.9 kg) 112 lb (50.8 kg)   Body mass index is 19.22 kg/m.  General:  Frail elderly female laying in bed eating a lollipop in no acute distress  HEENT: sclera anicteric  Neck: no JVD Vascular: No carotid bruits; distal pulses 2+ bilaterally Cardiac:  normal S1, S2; RRR; +murmur, no gallops, or rubs Lungs: decreased breath sounds at lung bases Abd: NABS, soft, nontender, no hepatomegaly Ext: no edema Musculoskeletal:  No deformities, BUE and BLE strength normal and equal Skin: warm and dry; multiple areas of ecchymosis on arms, face, legs Neuro:  CNs 2-12 intact, no focal abnormalities noted Psych:  Normal affect   EKG:  The EKG was personally reviewed and demonstrates:  Limb lead reversal - paced. Telemetry:  Telemetry was  personally reviewed and demonstrates:  Ventricular paced rhythm   Relevant CV Studies:  Left heart catheterization 04/2016: Conclusion     Prox RCA lesion, 30 %stenosed.  Mid RCA lesion, 50 %stenosed.  Dist Cx lesion, 20 %stenosed.  Ost LAD to Mid LAD lesion, 40 %stenosed.  2nd Diag lesion, 40 %stenosed.  Mid LAD to Dist LAD lesion, 90 %stenosed.  3rd Diag lesion, 70 %stenosed.  A STENT RESOLUTE ONYX [id number] drug eluting stent was successfully placed.  Prox Cx lesion, 99 %stenosed.  Post intervention, there is a 0% residual stenosis.  Ost 1st Mrg to 1st Mrg lesion, 99 %stenosed.  Post intervention, there is a 5% residual stenosis.  A BALLOON Hughes EUPHORA [id number] drug eluting stent was successfully placed, and overlaps previously placed stent.  Mid Cx lesion, 70 %stenosed.  Post intervention, there is a 0% residual stenosis.   Successful complex PCI to a 99% bifurcation stenosis in the proximal dominant left circumflex vessel with 99% stenosis in the OM1 vessel with 70% mid AV groove stenosis after the second marginal vessel, treated with PTCA of the obtuse marginal 1 vessel with a 99% stenosis being reduced to less than 5%, and ultimate tandem stenting of the proximal to mid AV groove circumflex with insertion of tandem 3.5 x 18 mm Resolute Onyx DES stents postdilated to 3.99 mm in the proximal stent and 3.80 mm in the second stent with the entire AV groove stenosis being reduced to 0% and resumption of brisk TIMI-3 flow.  RECOMMENDATION: Continue with dual antiplatelet therapy.  The patient's hemoglobin and hematocrit will  need to be monitored since her hemoglobin had dropped to 9.9 prior to today's procedure, and she had mild thrombocytopenia.  She will be hydrated and following stability of renal function, consider planned staged PCI to the 90% LAD stenosis later in the week if stable.   Stent intervention 04/2016 Conclusion     Prox RCA lesion, 30  %stenosed.  Mid RCA lesion, 50 %stenosed.  Dist Cx lesion, 20 %stenosed.  2nd Diag lesion, 40 %stenosed.  Ost 1st Mrg to 1st Mrg lesion, 5 %stenosed.  Prox Cx lesion, 0 %stenosed.  A drug eluting .  Mid Cx lesion, 0 %stenosed.  A drug eluting .  Mid LAD to Dist LAD lesion, 90 %stenosed.  3rd Diag lesion, 70 %stenosed.  A drug eluting stent was successfully placed.  Ost LAD to Mid LAD lesion, 90 %stenosed.  Post intervention, there is a 0% residual stenosis.   1. Patent left circumflex stents. 2. Successful angioplasty and drug-eluting stent placement to the proximal LAD.  Recommendations: Gentle hydration post cath. Dual antiplatelet therapy for at least one year.     Laboratory Data:  Chemistry Recent Labs  Lab 10/09/17 1150 10/10/17 0402 10/11/17 0319  NA 142 137 137  K 2.7* 3.4* 3.4*  CL 106 103 103  CO2 26 22 25   GLUCOSE 104* 88 80  BUN 58* 51* 45*  CREATININE 1.42* 1.22* 1.21*  CALCIUM 8.6* 8.7* 8.7*  GFRNONAA 32* 38* 39*  GFRAA 37* 44* 45*  ANIONGAP 10 12 9     Recent Labs  Lab 10/08/17 1632  PROT 6.0*  ALBUMIN 2.9*  AST 17  ALT 15  ALKPHOS 87  BILITOT 1.3*   Hematology Recent Labs  Lab 10/08/17 2120  10/09/17 1150 10/10/17 0402 10/11/17 0319  WBC 8.3  --   --  6.7 5.0  RBC 3.32*  --   --  3.40* 3.23*  HGB 10.5*  --  10.3* 10.9* 10.4*  HCT 33.4*   < > 33.1* 34.8* 32.5*  MCV 100.6*  --   --  102.4* 100.6*  MCH 31.6  --   --  32.1 32.2  MCHC 31.4  --   --  31.3 32.0  RDW 16.8*  --   --  16.7* 16.3*  PLT 108*  --   --  106* 109*   < > = values in this interval not displayed.   Cardiac EnzymesNo results for input(s): TROPONINI in the last 168 hours. No results for input(s): TROPIPOC in the last 168 hours.  BNPNo results for input(s): BNP, PROBNP in the last 168 hours.  DDimer No results for input(s): DDIMER in the last 168 hours.  Radiology/Studies:  Ct Abdomen Pelvis Wo Contrast  Result Date: 10/08/2017 CLINICAL DATA:   Abdominal pain with nausea and vomiting EXAM: CT ABDOMEN AND PELVIS WITHOUT CONTRAST TECHNIQUE: Multidetector CT imaging of the abdomen and pelvis was performed following the standard protocol without IV contrast. COMPARISON:  CT abdomen pelvis 07/08/2016 FINDINGS: LOWER CHEST: Large right and small left pleural effusions with associated atelectasis. HEPATOBILIARY: Normal hepatic contours and density. No intra- or extrahepatic biliary dilatation. Normal gallbladder. PANCREAS: Normal parenchymal contours without ductal dilatation. No peripancreatic fluid collection. SPLEEN: Normal. ADRENALS/URINARY TRACT: --Adrenal glands: Normal. --Right kidney/ureter: Right renal cyst now measures up to 4.4 cm, previously 3.4 cm. There are small calcifications at the renal pelvis, probably vascular. --Left kidney/ureter: Unchanged 1.8 cm left renal cyst. No hydronephrosis. --Urinary bladder: Normal for degree of distention STOMACH/BOWEL: --Stomach/Duodenum: No hiatal hernia or  other gastric abnormality. Normal duodenal course. --Small bowel: No dilatation or inflammation. --Colon: There is diffuse colonic diverticulosis without acute inflammation. Moderate volume of stool in distal colon. --Appendix: Not visualized. No right lower quadrant inflammation or free fluid. VASCULAR/LYMPHATIC: Atherosclerotic calcification is present within the non-aneurysmal abdominal aorta, without hemodynamically significant stenosis. no abdominal or pelvic lymphadenopathy. REPRODUCTIVE: Partially calcified uterine fibroid.  No adnexal mass. MUSCULOSKELETAL. Multilevel degenerative disc disease and facet arthrosis. No bony spinal canal stenosis. Unchanged L3 superior endplate Schmorl's node. OTHER: None. IMPRESSION: 1. Large right and small left pleural effusion. 2. Diffuse diverticulosis without acute inflammation. 3.  Aortic Atherosclerosis (ICD10-I70.0). Electronically Signed   By: Deatra Robinson M.D.   On: 10/08/2017 20:20   Ct Head Wo  Contrast  Result Date: 10/08/2017 CLINICAL DATA:  Altered level of consciousness EXAM: CT HEAD WITHOUT CONTRAST TECHNIQUE: Contiguous axial images were obtained from the base of the skull through the vertex without intravenous contrast. COMPARISON:  07/06/2016 FINDINGS: BRAIN: There is sulcal and ventricular prominence consistent with superficial and central atrophy. No intraparenchymal hemorrhage, mass effect nor midline shift. Periventricular and subcortical white matter hypodensities consistent with chronic small vessel ischemic disease are identified. No acute large vascular territory infarcts. No abnormal extra-axial fluid collections. Basal cisterns are not effaced and midline. VASCULAR: Moderate calcific atherosclerosis of the carotid siphons. SKULL: No skull fracture. No significant scalp soft tissue swelling. SINUSES/ORBITS: The mastoid air-cells are clear. The included paranasal sinuses are well-aerated.The included ocular globes and orbital contents are non-suspicious. OTHER: None. IMPRESSION: Atrophy with chronic small vessel ischemia. No acute intracranial abnormality. Electronically Signed   By: Tollie Eth M.D.   On: 10/08/2017 23:55    Assessment and Plan:    1. GIB in the setting of chronic coumadin use: patient presented with BRBPR, thought to be 2/2 diverticular bleed. Hgb 11.3 on admission> 10.4 today. Coumadin was reversed with vitamin K and K centra. Family preferred no invasive procedures and deferred colonoscopy at this time. GI asked for cardiology to evaluate for recommendations regarding anticoagulation.  - Would favor holding anticoagulation at discharge, at least in the short term given continued black stools, to allow for resolution of present illness.  - Would benefit from ongoing anticoagulation for stroke prevention. Will defer to GI to determine when anticoagulation can be resumed.  - Risk and benefits discussed with family at bedside   2. Permanent atrial fibrillation:  s/p AV nodal ablation and PPM placement. ?episode of Afib RVR noted on last PPM interrogation 09/26/2016. Noted to have a ventricular paced rhythm on EKG and telemetry this admission.  - Continue diltiazem - This patients CHA2DS2-VASc Score and unadjusted Ischemic Stroke Rate (% per year) is equal to 9.7 % stroke rate/year from a score of 6 Above score calculated as 1 point each if present [CHF, HTN, DM, Vascular=MI/PAD/Aortic Plaque, Age if 65-74, or Female] Above score calculated as 2 points each if present [Age > 75, or Stroke/TIA/TE] - Recommend resuming anticoagulation when cleared by GI. - Encourage safe ambulatory habits to minimize fall risk  3. CAD s/p NSTEMI with PCI/DES to Cx and LAD 2017: she was declined for CABG due to frailty during that admission. She is without anginal complaints.  - Continue statin - Continue to follow-up outpatient with Dr. Eldridge Dace  4. Chronic combined CHF: Last echo 04/2016 with EF 40-45% and diastolic dysfunction at the time of NSTEMI. She appears euvolemic on exam. - Continue po lasix at discharge.   4. HLD: no recent lipids -  Continue statin  5. S/p PPM: follows with Dr. Ladona Ridgel. Has not been seen in over 1 year. Last PPM interrogation with normal device function. - Continue routine follow-up.    For questions or updates, please contact [facility] Please consult [email or web address] for contact info under Cardiology/STEMI.   Signed, Beatriz Stallion, PA-C  10/11/2017 2:47 PM [phone]  History and all data above reviewed.  Patient examined.  I agree with the findings as above.  The patient was very confused and could not give me any history.  I did speak with her granddaughter who is POA.  She reports that she has intermittent confusion.  The patient denies any pain or SOB.    The patient exam reveals [id number] (paced)   ,  Lungs: Clear  ,  Abd: Positive bowel sounds, no rebound no guarding, Ext No edema  .  All available labs, radiology testing,  previous records reviewed. Agree with documented assessment and plan.   Atrial fib:  Acute GI bleed.  High risk for rebleed this soon after the initial event.  As is often the case, the patients at highest risk for embolic stroke with atrial fib (9% per year in her case) are often the ones at highest risk for bleeding.  There is no "correct" answer here.  I discussed this with her granddaughter.  I agree that for now anticoagulation is precluded.  However, if her bleeding is thought to have stopped and the family and patient had have had an informed patient centered discussion in the future this could be restarted.  I would suggest follow up perhaps in one month with Dr. Eldridge Dace to discuss.    Fayrene Fearing Mouhamad Teed  5:09 PM  10/11/2017

## 2017-10-11 NOTE — Plan of Care (Signed)
  Problem: Clinical Measurements: Goal: Respiratory complications will improve Outcome: Progressing   Problem: Elimination: Goal: Will not experience complications related to bowel motility Outcome: Progressing   

## 2017-10-11 NOTE — Progress Notes (Addendum)
Occupational Therapy Evaluation Patient Details Name: [name] MRN: [id number] DOB: May 25, 1926 Today's Date: 10/11/2017    History of Present Illness 84 year old female Patient was admitted to the hospital 10/08/2017 with the working diagnosis of acute lower gastrointestinal bleeding complicated by acute metabolic encephalopathy and acute kidney injury. Significant PMH for dementia, atrial fibrillation, complete heart block status post pacemaker, systolic heart failure, hypothyroidism, and depression/anxiety.   Clinical Impression   Unable to reach daughter/grandaughter to determine pt's PLOF and baseline cognition. Pt currently follows simple one step commands inconsistently; minA with max VC supine>sit EOB. She is able to perform LB dressing with minA for VC and safety. Based on current level of functioning recommend SNF.     Follow Up Recommendations  SNF    Equipment Recommendations  3 in 1 bedside commode    Recommendations for Other Services PT consult     Precautions / Restrictions Precautions Precautions: Fall;ICD/Pacemaker Restrictions Weight Bearing Restrictions: No      Mobility Bed Mobility Overal bed mobility: Needs Assistance Bed Mobility: Supine to Sit;Sit to Supine(abrupt sit>supine do to unanticipated pain in back)           General bed mobility comments: pt required VC to continue progression to sit EOB  Transfers                 General transfer comment: unable to assess this date due to pain and cognition; will further asses     Balance Overall balance assessment: Able to sit unsupported until abruptly returning to sidelying due to pain (not able to assess standing balance due to pain)                                         ADL either performed or assessed with clinical judgement   ADL Overall ADL's : Needs assistance/impaired Eating/Feeding: Set up;Supervision/ safety   Grooming: Minimal assistance;Cueing for  safety   Upper Body Bathing: Minimal assistance;Sitting   Lower Body Bathing: Moderate assistance;Sitting/lateral leans; Able to reach behind and wash bottom in sidelying. VC for task completion    Upper Body Dressing : Minimal assistance;Sitting   Lower Body Dressing: Moderate assistance;Cueing for sequencing;Sitting/lateral leans                 General ADL Comments: unable to assess mobility due to pain and cognition     Vision  will assess       Perception     Praxis      Pertinent Vitals/Pain Pain Assessment: Faces Faces Pain Scale: Hurts worst Pain Location: back (abrupt onset sitting EOB)(upon arrival pt reported 0 pain) Pain Descriptors / Indicators: Moaning Pain Intervention(s): Limited activity within patient's tolerance;Premedicated before session     Hand Dominance Right   Extremity/Trunk Assessment Upper Extremity Assessment Upper Extremity Assessment: generalized weakness   Lower Extremity Assessment Lower Extremity Assessment: Defer to PT evaluation   Cervical / Trunk Assessment Cervical / Trunk Assessment: Kyphotic   Communication Communication Communication: Expressive difficulties(tangential thoughts)   Cognition Arousal/Alertness: Awake/alert Behavior During Therapy: Impulsive Overall Cognitive Status: Impaired/Different from baseline Area of Impairment: Following commands;Attention;Orientation;Memory;Safety/judgement;Awareness;Problem solving(no family/caregiver to determine baseline)                 Orientation Level: Disoriented to;Person;Place;Time;Situation(stated incorrect birthdate; listed months when asked current) Current Attention Level: Focused Memory: Decreased short-term memory Following Commands: Follows one step commands inconsistently Safety/Judgement: Decreased  awareness of deficits;Decreased awareness of safety         General Comments       Exercises     Shoulder Instructions      Home Living                                    Additional Comments:        Prior Functioning/Environment          Comments: Unsuccessful attempt to contact duaghter/granddaughter for pt's PLOF         OT Problem List: Decreased cognition;Decreased activity tolerance;Pain;Decreased knowledge of precautions;Decreased knowledge of use of DME or AE      OT Treatment/Interventions: Self-care/ADL training;DME and/or AE instruction;Therapeutic activities;Cognitive remediation/compensation;Patient/family education    OT Goals(Current goals can be found in the care plan section) Acute Rehab OT Goals Patient Stated Goal: go home OT Goal Formulation: With patient Time For Goal Achievement: 10/25/17 Potential to Achieve Goals: Fair  OT Frequency: Min 2X/week   Barriers to D/C: Other (comment)(unable to contact family to determine level of support)          Co-evaluation              AM-PAC PT "6 Clicks" Daily Activity     Outcome Measure Help from another person eating meals?: A Little Help from another person taking care of personal grooming?: A Lot Help from another person toileting, which includes using toliet, bedpan, or urinal?: A Lot Help from another person bathing (including washing, rinsing, drying)?: A Little Help from another person to put on and taking off regular upper body clothing?: A Little Help from another person to put on and taking off regular lower body clothing?: A Little 6 Click Score: 16   End of Session Nurse Communication: Mobility status  Activity Tolerance: Patient limited by pain Patient left: in bed;with call bell/phone within reach  OT Visit Diagnosis: Other symptoms and signs involving cognitive function;Muscle weakness (generalized) (M62.81)                Time: 1610-9604 OT Time Calculation (min): 32 min Charges:    G-Codes:     Diona Browner OTS  Skeet Simmer Caylyn Tedeschi, OT/L  OT Clinical Specialist [phone]    10/11/2017, 2:01 PM

## 2017-10-11 NOTE — Progress Notes (Signed)
OT Note - Addendum    10/11/17 1700  OT Visit Information  Last OT Received On 10/11/17  OT Time Calculation  OT Start Time (ACUTE ONLY) 1103  OT Stop Time (ACUTE ONLY) 1135  OT Time Calculation (min) 32 min  OT General Charges  $OT Visit 1 Visit  OT Evaluation  $OT Eval Moderate Complexity 1 Mod  OT Treatments  $Self Care/Home Management  8-22 mins  Luisa Dago, OT/L  OT Clinical Specialist [phone]

## 2017-10-12 LAB — FOLATE RBC
Folate, RBC: >1956 ng/mL (ref >498)
HEMATOCRIT: 31.7 % — AB (ref 34.0–46.6)

## 2017-10-12 LAB — GLUCOSE, CAPILLARY
GLUCOSE-CAPILLARY: 95 mg/dL (ref 65–99)
Glucose-Capillary: 105 mg/dL — ABNORMAL HIGH (ref 65–99)
Glucose-Capillary: 98 mg/dL (ref 65–99)

## 2017-10-12 NOTE — Progress Notes (Signed)
Telephone report given to [facility].

## 2017-10-12 NOTE — Progress Notes (Signed)
Patient will DC to: [name] Anticipated DC date: 10/12/17 Family notified: [facility] Transport by: Sharin Mons   Per MD patient ready for DC to [name]. RN, patient, patient's family, and facility notified of DC. Discharge Summary sent to facility. RN called report. DC packet on chart. Ambulance transport requested for patient.   CSW signing off.  Cristobal Goldmann, LCSW Clinical Social Worker [phone]

## 2017-10-12 NOTE — Progress Notes (Addendum)
PROGRESS NOTE    [name]  ZOX:[id number] DOB: 1926-09-25 DOA: 10/08/2017 PCP: [name]    Brief Narrative:  84 year old female who presented with lethargy and rectal bleeding.  She does have the significant past medical history for dementia, atrial fibrillation, complete heart block status post pacemaker, systolic heart failure, hypothyroidism, and depression/anxiety.  Patient was noted to be nonverbal at the skilled nursing facility,with large amounts of red and maroon blood in her diaper.  Due to acute change in her condition she was sent to the emergency room for further evaluation.  On the initial physical examination blood pressure 111/61, heart rate 71, respiratory 23, oxygen saturation 98%.  She was lethargic, moist mucous membranes, lungs were clear to auscultation bilaterally, heart S1-S2 present rhythmic, abdomen was soft nontender, lower extremities with no edema, positive soft mobile mass at the lateral distal aspect of her right leg.  Sodium 141, potassium 3.3, chloride 100, bicarb 29, glucose 150, BUN 63, creatinine 1.69, white count 7.6, hemoglobin 11.3, hematocrit 35.4, platelets 107, INR 2.4.  CT of the abdomen with diffuse diverticulosis without acute inflammation.  Head CT with atrophy, no acute changes.  EKG paced rhythm with premature ventricular complexes.  Patient was admitted to the hospital with the working diagnosis of acute lower gastrointestinal bleeding complicated by acute metabolic encephalopathy and acute kidney injury.  Assessment & Plan:   Principal Problem:   Acute GI bleeding Active Problems:   Hypothyroidism   Diabetes mellitus with neuropathy (HCC)   Anxiety state   Essential hypertension   ATRIAL FIBRILLATION   Chronic combined systolic and diastolic CHF (congestive heart failure) (HCC)   Renal insufficiency   CHB (complete heart block) (HCC) post AVN ablation   Pleural effusion  1.Acute lower GI bleed. Likely  diverticular bleed, will continue close monitoring of hb and hct. No current indication for PRBC transfusion. No plans for invasive workup per GI.Question risk versus benefits of continuing Coumadin as outpatient noted.Cardiology was consulted. Determined benefits to anticoagulation outweighs risks. See below. stable  2.Metabolic encephalopathy. Has improved with supportive medical therapy, will continue neuro checks per unit protocol. Advance diet as tolerated.Avoid overmedication of sedating medicines.Appears conversant and appropriate this morning.  3.Acute kidney injury.Reticulocyte the nephrotoxic agents currently on hold. Renal function has since improved. Recommend repeat renal panel in 1 week  4.Atrial fibrillation,chronic. Rate controlled with diltiazem 300 mg daily.Patient had been off of anticoagulation given concerns for acute blood loss anemia per above this admit. Per Cardiology, recommendation to continue with anticoagulation per GI recs. Discussed with GI, Dr. Chales Abrahams. Per GI, recommendation to resume coumadin in 2 weeks without bridge with goal INR of 2-3, monitoring closely for bleeding  5.Systolic heart failure, chronic and stable. Diuretics were briefly held. To resume lasix as outpatient. Please ensure good PO intake. Low threshold for decreasing diuretic dose if pt noted to have limited PO intake  6.Hypothyroidism. Continue levothyroxinepatient tolerates  7.Dementia. Patientseems somewhat confused although she does converse appropriately. Per nursing staff, patient's mentation seems much improved overnight.   8. T2DM.Metformin had been on hold while in hospital. Resume meds on discharge  Moderate protein calorie malnutrition  DVT prophylaxis: SCDs Code Status: Full code Family Communication: Patient in room, family not at bedside Disposition Plan: SNF today  Consultants:   Gastroenterology  Cardiology  Procedures:      Antimicrobials: Anti-infectives (From admission, onward)   Start     Dose/Rate Route Frequency Ordered Stop   10/08/17 2115  ciprofloxacin (CIPRO) IVPB  400 mg  Status:  Discontinued     400 mg 200 mL/hr over 60 Minutes Intravenous  Once 10/08/17 2108 10/08/17 2205   10/08/17 2115  metroNIDAZOLE (FLAGYL) IVPB 500 mg  Status:  Discontinued     500 mg 100 mL/hr over 60 Minutes Intravenous  Once 10/08/17 2108 10/08/17 2205      Subjective: No complaints.  Objective: Vitals:   10/11/17 2325 10/12/17 0325 10/12/17 0500 10/12/17 0800  BP: (!) 103/55 (!) 110/59  (!) 113/56  Pulse:    71  Resp: Temp:    98.5 F (36.9 C)  TempSrc:    Oral  SpO2:    95%  Weight:   45.4 kg (100 lb)   Height:        Intake/Output Summary (Last 24 hours) at 10/12/2017 0831 Last data filed at 10/11/2017 1728 Gross per 24 hour  Intake 3 ml  Output 500 ml  Net -497 ml   Filed Weights   10/10/17 0356 10/11/17 0452 10/12/17 0500  Weight: 49.9 kg (110 lb) 50.8 kg (112 lb) 45.4 kg (100 lb)    Examination:  General exam: Appears calm and comfortable  Respiratory system: Respiratory effort normal. Cardiovascular system: Perfused  Data Reviewed: I have personally reviewed following labs and imaging studies  CBC: Recent Labs  Lab 10/08/17 1632 10/08/17 1702 10/08/17 2120 10/08/17 2222 10/09/17 1150 10/10/17 0402 10/11/17 0319  WBC 7.6  --  8.3  --   --  6.7 5.0  HGB 11.3* 12.2 10.5*  --  10.3* 10.9* 10.4*  HCT 35.4* 36.0 33.4* 35.5 33.1* 34.8* 32.5*  MCV 100.3*  --  100.6*  --   --  102.4* 100.6*  PLT 107*  --  108*  --   --  106* 109*   Basic Metabolic Panel: Recent Labs  Lab 10/08/17 1632 10/08/17 1702 10/09/17 0312 10/09/17 1150 10/10/17 0402 10/11/17 0319  NA 141 140  --  142 137 137  K 3.3* 3.5  --  2.7* 3.4* 3.4*  CL 100* 98*  --  106 103 103  CO2 29  --   --  GLUCOSE 150* 143*  --  104* 88 80  BUN 63* 65*  --  58* 51* 45*  CREATININE 1.69* 1.50*   --  1.42* 1.22* 1.21*  CALCIUM 9.2  --   --  8.6* 8.7* 8.7*  MG  --   --  2.4  --   --   --    GFR: Estimated Creatinine Clearance: 23 mL/min (A) (by C-G formula based on SCr of 1.21 mg/dL (H)). Liver Function Tests: Recent Labs  Lab 10/08/17 1632  AST 17  ALT 15  ALKPHOS 87  BILITOT 1.3*  PROT 6.0*  ALBUMIN 2.9*   No results for input(s): LIPASE, AMYLASE in the last 168 hours. No results for input(s): AMMONIA in the last 168 hours. Coagulation Profile: Recent Labs  Lab 10/08/17 1632 10/08/17 2055 10/09/17 0312  INR 2.41 1.38 1.24   Cardiac Enzymes: No results for input(s): CKTOTAL, CKMB, CKMBINDEX, TROPONINI in the last 168 hours. BNP (last 3 results) No results for input(s): PROBNP in the last 8760 hours. HbA1C: No results for input(s): HGBA1C in the last 72 hours. CBG: Recent Labs  Lab 10/11/17 1747 10/11/17 2031 10/12/17 0008 10/12/17 0418 10/12/17 0750  GLUCAP 126* 132* 98 105* 95   Lipid Profile: No results for input(s): CHOL, HDL, LDLCALC, TRIG, CHOLHDL, LDLDIRECT in the  last 72 hours. Thyroid Function Tests: No results for input(s): TSH, T4TOTAL, FREET4, T3FREE, THYROIDAB in the last 72 hours. Anemia Panel: Recent Labs    10/09/17 1150  VITAMINB12 643   Sepsis Labs: Recent Labs  Lab 10/08/17 1703 10/08/17 1944  LATICACIDVEN 2.12* 1.55    Recent Results (from the past 240 hour(s))  Urine culture     Status: None   Collection Time: 10/08/17 12:38 AM  Result Value Ref Range Status   Specimen Description URINE, CATHETERIZED  Final   Special Requests NONE  Final   Culture   Final    NO GROWTH Performed at [facility], [address], [place], [place] [address]    Report Status 10/10/2017 FINAL  Final  MRSA PCR Screening     Status: None   Collection Time: 10/09/17  6:35 PM  Result Value Ref Range Status   MRSA by PCR NEGATIVE NEGATIVE Final    Comment:        The GeneXpert MRSA Assay (FDA approved for NASAL specimens only), is  one component of a comprehensive MRSA colonization surveillance program. It is not intended to diagnose MRSA infection nor to guide or monitor treatment for MRSA infections. Performed at [facility], [address], [place], [place] [address]      Radiology Studies: No results found.  Scheduled Meds: . atorvastatin  40 mg Oral q1800  . diltiazem  300 mg Oral Daily  . feeding supplement (ENSURE ENLIVE)  237 mL Oral BID BM  . insulin aspart  0-9 Units Subcutaneous Q4H  . levothyroxine  100 mcg Oral QAC breakfast  . mouth rinse  15 mL Mouth Rinse BID  . multivitamin with minerals  1 tablet Oral Daily  . sodium chloride flush  3 mL Intravenous Q12H  . sodium chloride flush  3 mL Intravenous Q12H   Continuous Infusions: . sodium chloride       LOS: 4 days   Rickey Barbara, MD [facility] Pager [phone]  If 7PM-7AM, please contact night-coverage [email or web address] Password [facility] 10/12/2017, 8:31 AM

## 2017-10-12 NOTE — Progress Notes (Signed)
Nsg Discharge Note  Admit Date:  10/08/2017 Discharge date: 10/12/2017   [name] to be D/C'd Skilled nursing facility per MD order.  AVS completed.  Copy for chart, and copy for patient signed, and dated. Patient/caregiver able to verbalize understanding.  Discharge Medication: Allergies as of 10/12/2017      Reactions   Codeine Sulfate Shortness Of Breath, Other (See Comments)   Felt funny   Simvastatin Other (See Comments)    choking, acid reflux      Medication List    STOP taking these medications   methocarbamol 500 MG tablet Commonly known as:  ROBAXIN   metolazone 2.5 MG tablet Commonly known as:  ZAROXOLYN   traMADol 50 MG tablet Commonly known as:  ULTRAM     TAKE these medications   atorvastatin 40 MG tablet Commonly known as:  LIPITOR Take 1 tablet (40 mg total) by mouth daily at 6 PM. What changed:  when to take this   bisacodyl 10 MG suppository Commonly known as:  DULCOLAX Place 10 mg rectally daily as needed for severe constipation.   CARTIA XT 300 MG 24 hr capsule Generic drug:  diltiazem take 1 capsule by mouth once daily   docusate sodium 100 MG capsule Commonly known as:  COLACE Take 100 mg by mouth 2 (two) times daily as needed (constipation).   ferrous sulfate 325 (65 FE) MG tablet take 1 tablet by mouth once daily   furosemide 40 MG tablet Commonly known as:  LASIX take 1 tablet by mouth once daily What changed:    how much to take  how to take this  when to take this   hydrOXYzine 10 MG tablet Commonly known as:  ATARAX/VISTARIL Take 10 mg by mouth 2 (two) times daily. For anxiety/itching (8a and 4p)   hyoscyamine 0.375 MG 12 hr tablet Commonly known as:  LEVBID take 1 tablet by mouth twice a day ONLY IF NEEDED  **DO NOT TAKE EVERYDAY MUST LAST 30 DAYS** What changed:  See the new instructions.   ketotifen 0.025 % ophthalmic solution Commonly known as:  ZADITOR Place 1 drop into both eyes 2 (two) times daily as  needed (itching).   levalbuterol 0.63 MG/3ML nebulizer solution Commonly known as:  XOPENEX Take 0.63 mg by nebulization every 6 (six) hours as needed for wheezing or shortness of breath.   levothyroxine 100 MCG tablet Commonly known as:  SYNTHROID, LEVOTHROID Take 100 mcg by mouth daily before breakfast.   LORazepam 0.5 MG tablet Commonly known as:  ATIVAN Take 1 tablet (0.5 mg total) by mouth 2 (two) times daily as needed for anxiety.   Melatonin 3 MG Tabs Take 6 mg by mouth at bedtime.   metFORMIN 500 MG tablet Commonly known as:  GLUCOPHAGE Take 500 mg by mouth daily with breakfast.   morphine 15 MG tablet Commonly known as:  MSIR Take 1 tablet (15 mg total) by mouth every 4 (four) hours as needed for severe pain.   NUTRITIONAL SUPPLEMENT PO Take 120 mLs by mouth 3 (three) times daily. "Med Pass"   OXYGEN Inhale 2 L into the lungs as needed (shortness of breath).   polyethylene glycol powder powder Commonly known as:  GLYCOLAX/MIRALAX Take 17 g by mouth daily. Mix in liquid and drink   potassium chloride SA 20 MEQ tablet Commonly known as:  K-DUR,KLOR-CON Take 20 mEq by mouth daily.   SENNA S 8.6-50 MG tablet Generic drug:  senna-docusate Take 1 tablet by mouth daily  as needed (constipation).   sertraline 100 MG tablet Commonly known as:  ZOLOFT Take 100 mg by mouth daily.   warfarin 2.5 MG tablet Commonly known as:  COUMADIN Take 1 tablet (2.5 mg total) by mouth every evening. Please start on 10/25/17 Start taking on:  10/25/2017 What changed:    additional instructions  These instructions start on 10/25/2017. If you are unsure what to do until then, ask your doctor or other care provider.       Discharge Assessment: Vitals:   10/12/17 0325 10/12/17 0800  BP: (!) 110/59 (!) 113/56  Pulse:  71  Resp: 15 14  Temp:  98.5 F (36.9 C)  SpO2:  95%   Skin clean, dry and intact without evidence of skin break down, no evidence of skin tears noted. IV  catheter discontinued intact. Site without signs and symptoms of complications - no redness or edema noted at insertion site, patient denies c/o pain - only slight tenderness at site.  Dressing with slight pressure applied.  D/c Instructions-Education: Discharge instructions given to patient/family with verbalized understanding. D/c education completed with patient/family including follow up instructions, medication list, d/c activities limitations if indicated, with other d/c instructions as indicated by MD - patient able to verbalize understanding, all questions fully answered. Patient instructed to return to ED, call 911, or call MD for any changes in condition.  Patient transported by [facility] to [facility] nursing home.  Report given to Annett Gula, RN 10/12/2017 11:29 AM

## 2017-10-13 DIAGNOSIS — M6281 Muscle weakness (generalized): Secondary | ICD-10-CM | POA: Diagnosis not present

## 2017-10-13 DIAGNOSIS — E1140 Type 2 diabetes mellitus with diabetic neuropathy, unspecified: Secondary | ICD-10-CM | POA: Diagnosis not present

## 2017-10-13 DIAGNOSIS — D649 Anemia, unspecified: Secondary | ICD-10-CM | POA: Diagnosis not present

## 2017-10-13 DIAGNOSIS — K922 Gastrointestinal hemorrhage, unspecified: Secondary | ICD-10-CM | POA: Diagnosis not present

## 2017-10-13 DIAGNOSIS — E119 Type 2 diabetes mellitus without complications: Secondary | ICD-10-CM | POA: Diagnosis not present

## 2017-10-13 DIAGNOSIS — E785 Hyperlipidemia, unspecified: Secondary | ICD-10-CM | POA: Diagnosis not present

## 2017-10-13 DIAGNOSIS — Z950 Presence of cardiac pacemaker: Secondary | ICD-10-CM | POA: Diagnosis not present

## 2017-10-13 DIAGNOSIS — E039 Hypothyroidism, unspecified: Secondary | ICD-10-CM | POA: Diagnosis not present

## 2017-10-13 DIAGNOSIS — M810 Age-related osteoporosis without current pathological fracture: Secondary | ICD-10-CM | POA: Diagnosis not present

## 2017-10-13 DIAGNOSIS — I509 Heart failure, unspecified: Secondary | ICD-10-CM | POA: Diagnosis not present

## 2017-10-13 DIAGNOSIS — I4891 Unspecified atrial fibrillation: Secondary | ICD-10-CM | POA: Diagnosis not present

## 2017-10-13 DIAGNOSIS — G9341 Metabolic encephalopathy: Secondary | ICD-10-CM | POA: Diagnosis not present

## 2017-10-13 DIAGNOSIS — I10 Essential (primary) hypertension: Secondary | ICD-10-CM | POA: Diagnosis not present

## 2017-10-13 DIAGNOSIS — R278 Other lack of coordination: Secondary | ICD-10-CM | POA: Diagnosis not present

## 2017-10-13 DIAGNOSIS — Z79899 Other long term (current) drug therapy: Secondary | ICD-10-CM | POA: Diagnosis not present

## 2017-10-13 DIAGNOSIS — E559 Vitamin D deficiency, unspecified: Secondary | ICD-10-CM | POA: Diagnosis not present

## 2017-10-14 DIAGNOSIS — E039 Hypothyroidism, unspecified: Secondary | ICD-10-CM | POA: Diagnosis not present

## 2017-10-14 DIAGNOSIS — I10 Essential (primary) hypertension: Secondary | ICD-10-CM | POA: Diagnosis not present

## 2017-10-14 DIAGNOSIS — E1140 Type 2 diabetes mellitus with diabetic neuropathy, unspecified: Secondary | ICD-10-CM | POA: Diagnosis not present

## 2017-10-14 DIAGNOSIS — R278 Other lack of coordination: Secondary | ICD-10-CM | POA: Diagnosis not present

## 2017-10-14 DIAGNOSIS — M810 Age-related osteoporosis without current pathological fracture: Secondary | ICD-10-CM | POA: Diagnosis not present

## 2017-10-14 DIAGNOSIS — M6281 Muscle weakness (generalized): Secondary | ICD-10-CM | POA: Diagnosis not present

## 2017-10-14 DIAGNOSIS — G9341 Metabolic encephalopathy: Secondary | ICD-10-CM | POA: Diagnosis not present

## 2017-10-14 DIAGNOSIS — Z950 Presence of cardiac pacemaker: Secondary | ICD-10-CM | POA: Diagnosis not present

## 2017-10-14 DIAGNOSIS — I4891 Unspecified atrial fibrillation: Secondary | ICD-10-CM | POA: Diagnosis not present

## 2017-10-15 DIAGNOSIS — E039 Hypothyroidism, unspecified: Secondary | ICD-10-CM | POA: Diagnosis not present

## 2017-10-15 DIAGNOSIS — G9341 Metabolic encephalopathy: Secondary | ICD-10-CM | POA: Diagnosis not present

## 2017-10-15 DIAGNOSIS — Z950 Presence of cardiac pacemaker: Secondary | ICD-10-CM | POA: Diagnosis not present

## 2017-10-15 DIAGNOSIS — E1140 Type 2 diabetes mellitus with diabetic neuropathy, unspecified: Secondary | ICD-10-CM | POA: Diagnosis not present

## 2017-10-15 DIAGNOSIS — M6281 Muscle weakness (generalized): Secondary | ICD-10-CM | POA: Diagnosis not present

## 2017-10-15 DIAGNOSIS — R278 Other lack of coordination: Secondary | ICD-10-CM | POA: Diagnosis not present

## 2017-10-15 DIAGNOSIS — I4891 Unspecified atrial fibrillation: Secondary | ICD-10-CM | POA: Diagnosis not present

## 2017-10-15 DIAGNOSIS — M810 Age-related osteoporosis without current pathological fracture: Secondary | ICD-10-CM | POA: Diagnosis not present

## 2017-10-15 DIAGNOSIS — I10 Essential (primary) hypertension: Secondary | ICD-10-CM | POA: Diagnosis not present

## 2017-10-16 DIAGNOSIS — G9341 Metabolic encephalopathy: Secondary | ICD-10-CM | POA: Diagnosis not present

## 2017-10-16 DIAGNOSIS — K921 Melena: Secondary | ICD-10-CM | POA: Diagnosis not present

## 2017-10-16 DIAGNOSIS — E1140 Type 2 diabetes mellitus with diabetic neuropathy, unspecified: Secondary | ICD-10-CM | POA: Diagnosis not present

## 2017-10-16 DIAGNOSIS — I10 Essential (primary) hypertension: Secondary | ICD-10-CM | POA: Diagnosis not present

## 2017-10-16 DIAGNOSIS — I5032 Chronic diastolic (congestive) heart failure: Secondary | ICD-10-CM | POA: Diagnosis not present

## 2017-10-16 DIAGNOSIS — E039 Hypothyroidism, unspecified: Secondary | ICD-10-CM | POA: Diagnosis not present

## 2017-10-16 DIAGNOSIS — M810 Age-related osteoporosis without current pathological fracture: Secondary | ICD-10-CM | POA: Diagnosis not present

## 2017-10-16 DIAGNOSIS — R278 Other lack of coordination: Secondary | ICD-10-CM | POA: Diagnosis not present

## 2017-10-16 DIAGNOSIS — M6281 Muscle weakness (generalized): Secondary | ICD-10-CM | POA: Diagnosis not present

## 2017-10-16 DIAGNOSIS — I481 Persistent atrial fibrillation: Secondary | ICD-10-CM | POA: Diagnosis not present

## 2017-10-16 DIAGNOSIS — I4891 Unspecified atrial fibrillation: Secondary | ICD-10-CM | POA: Diagnosis not present

## 2017-10-16 DIAGNOSIS — D62 Acute posthemorrhagic anemia: Secondary | ICD-10-CM | POA: Diagnosis not present

## 2017-10-16 DIAGNOSIS — Z950 Presence of cardiac pacemaker: Secondary | ICD-10-CM | POA: Diagnosis not present

## 2017-10-17 ENCOUNTER — Telehealth: Payer: Self-pay

## 2017-10-17 DIAGNOSIS — G4700 Insomnia, unspecified: Secondary | ICD-10-CM | POA: Diagnosis not present

## 2017-10-17 DIAGNOSIS — Z950 Presence of cardiac pacemaker: Secondary | ICD-10-CM | POA: Diagnosis not present

## 2017-10-17 DIAGNOSIS — R278 Other lack of coordination: Secondary | ICD-10-CM | POA: Diagnosis not present

## 2017-10-17 DIAGNOSIS — M6281 Muscle weakness (generalized): Secondary | ICD-10-CM | POA: Diagnosis not present

## 2017-10-17 DIAGNOSIS — G9341 Metabolic encephalopathy: Secondary | ICD-10-CM | POA: Diagnosis not present

## 2017-10-17 DIAGNOSIS — E1140 Type 2 diabetes mellitus with diabetic neuropathy, unspecified: Secondary | ICD-10-CM | POA: Diagnosis not present

## 2017-10-17 DIAGNOSIS — I10 Essential (primary) hypertension: Secondary | ICD-10-CM | POA: Diagnosis not present

## 2017-10-17 DIAGNOSIS — E039 Hypothyroidism, unspecified: Secondary | ICD-10-CM | POA: Diagnosis not present

## 2017-10-17 DIAGNOSIS — M810 Age-related osteoporosis without current pathological fracture: Secondary | ICD-10-CM | POA: Diagnosis not present

## 2017-10-17 DIAGNOSIS — I4891 Unspecified atrial fibrillation: Secondary | ICD-10-CM | POA: Diagnosis not present

## 2017-10-17 NOTE — Telephone Encounter (Signed)
Left message for patient to call back  

## 2017-10-17 NOTE — Telephone Encounter (Signed)
-----   Message from Corky Crafts, MD sent at 10/17/2017  3:37 PM EDT ----- See below.  JV ----- Message ----- From: Rollene Rotunda, MD Sent: 10/11/2017   5:12 PM To: Corky Crafts, MD  I don't think that an appt was made but if you can get your nurse to work on this for about a month.  Need to probably have one follow up discussion about whether or not to ever restart warfarin in this frail lady with GI bleed and chads of 6.

## 2017-10-18 DIAGNOSIS — E1140 Type 2 diabetes mellitus with diabetic neuropathy, unspecified: Secondary | ICD-10-CM | POA: Diagnosis not present

## 2017-10-18 DIAGNOSIS — M810 Age-related osteoporosis without current pathological fracture: Secondary | ICD-10-CM | POA: Diagnosis not present

## 2017-10-18 DIAGNOSIS — E039 Hypothyroidism, unspecified: Secondary | ICD-10-CM | POA: Diagnosis not present

## 2017-10-18 DIAGNOSIS — I4891 Unspecified atrial fibrillation: Secondary | ICD-10-CM | POA: Diagnosis not present

## 2017-10-18 DIAGNOSIS — M6281 Muscle weakness (generalized): Secondary | ICD-10-CM | POA: Diagnosis not present

## 2017-10-18 DIAGNOSIS — Z950 Presence of cardiac pacemaker: Secondary | ICD-10-CM | POA: Diagnosis not present

## 2017-10-18 DIAGNOSIS — G9341 Metabolic encephalopathy: Secondary | ICD-10-CM | POA: Diagnosis not present

## 2017-10-18 DIAGNOSIS — R278 Other lack of coordination: Secondary | ICD-10-CM | POA: Diagnosis not present

## 2017-10-18 DIAGNOSIS — I10 Essential (primary) hypertension: Secondary | ICD-10-CM | POA: Diagnosis not present

## 2017-10-18 NOTE — Telephone Encounter (Signed)
Left message for patient to call back  

## 2017-10-19 DIAGNOSIS — M810 Age-related osteoporosis without current pathological fracture: Secondary | ICD-10-CM | POA: Diagnosis not present

## 2017-10-19 DIAGNOSIS — Z950 Presence of cardiac pacemaker: Secondary | ICD-10-CM | POA: Diagnosis not present

## 2017-10-19 DIAGNOSIS — R278 Other lack of coordination: Secondary | ICD-10-CM | POA: Diagnosis not present

## 2017-10-19 DIAGNOSIS — E039 Hypothyroidism, unspecified: Secondary | ICD-10-CM | POA: Diagnosis not present

## 2017-10-19 DIAGNOSIS — E1140 Type 2 diabetes mellitus with diabetic neuropathy, unspecified: Secondary | ICD-10-CM | POA: Diagnosis not present

## 2017-10-19 DIAGNOSIS — G9341 Metabolic encephalopathy: Secondary | ICD-10-CM | POA: Diagnosis not present

## 2017-10-19 DIAGNOSIS — I4891 Unspecified atrial fibrillation: Secondary | ICD-10-CM | POA: Diagnosis not present

## 2017-10-19 DIAGNOSIS — M6281 Muscle weakness (generalized): Secondary | ICD-10-CM | POA: Diagnosis not present

## 2017-10-19 DIAGNOSIS — I10 Essential (primary) hypertension: Secondary | ICD-10-CM | POA: Diagnosis not present

## 2017-10-19 NOTE — Telephone Encounter (Signed)
Called and spoke to patient's granddaughter [name] (DPR on file). Made her aware that Dr. Eldridge DaceVaranasi would like for the patient to come in for an appointment to be evaluated and discuss whether or not to ever restart the patient's warfarin since she has had a GI bleed and has a chads of 6. Granddaughter would like to be present at appointment. Appointment made for 11/15/17 at 11:40 AM.   [name], where the patient resides, at [phone] and spoke to [name] to make aware of appointment. [name] confirmed appointment.

## 2017-10-20 DIAGNOSIS — M79661 Pain in right lower leg: Secondary | ICD-10-CM | POA: Diagnosis not present

## 2017-10-21 DIAGNOSIS — I481 Persistent atrial fibrillation: Secondary | ICD-10-CM | POA: Diagnosis not present

## 2017-10-21 DIAGNOSIS — K921 Melena: Secondary | ICD-10-CM | POA: Diagnosis not present

## 2017-10-21 DIAGNOSIS — D62 Acute posthemorrhagic anemia: Secondary | ICD-10-CM | POA: Diagnosis not present

## 2017-10-21 DIAGNOSIS — T148XXD Other injury of unspecified body region, subsequent encounter: Secondary | ICD-10-CM | POA: Diagnosis not present

## 2017-10-22 DIAGNOSIS — M810 Age-related osteoporosis without current pathological fracture: Secondary | ICD-10-CM | POA: Diagnosis not present

## 2017-10-22 DIAGNOSIS — E039 Hypothyroidism, unspecified: Secondary | ICD-10-CM | POA: Diagnosis not present

## 2017-10-22 DIAGNOSIS — R278 Other lack of coordination: Secondary | ICD-10-CM | POA: Diagnosis not present

## 2017-10-22 DIAGNOSIS — E1140 Type 2 diabetes mellitus with diabetic neuropathy, unspecified: Secondary | ICD-10-CM | POA: Diagnosis not present

## 2017-10-22 DIAGNOSIS — I10 Essential (primary) hypertension: Secondary | ICD-10-CM | POA: Diagnosis not present

## 2017-10-22 DIAGNOSIS — I4891 Unspecified atrial fibrillation: Secondary | ICD-10-CM | POA: Diagnosis not present

## 2017-10-22 DIAGNOSIS — M6281 Muscle weakness (generalized): Secondary | ICD-10-CM | POA: Diagnosis not present

## 2017-10-22 DIAGNOSIS — Z950 Presence of cardiac pacemaker: Secondary | ICD-10-CM | POA: Diagnosis not present

## 2017-10-23 DIAGNOSIS — Z950 Presence of cardiac pacemaker: Secondary | ICD-10-CM | POA: Diagnosis not present

## 2017-10-23 DIAGNOSIS — M6281 Muscle weakness (generalized): Secondary | ICD-10-CM | POA: Diagnosis not present

## 2017-10-23 DIAGNOSIS — I10 Essential (primary) hypertension: Secondary | ICD-10-CM | POA: Diagnosis not present

## 2017-10-23 DIAGNOSIS — M810 Age-related osteoporosis without current pathological fracture: Secondary | ICD-10-CM | POA: Diagnosis not present

## 2017-10-23 DIAGNOSIS — E039 Hypothyroidism, unspecified: Secondary | ICD-10-CM | POA: Diagnosis not present

## 2017-10-23 DIAGNOSIS — E1140 Type 2 diabetes mellitus with diabetic neuropathy, unspecified: Secondary | ICD-10-CM | POA: Diagnosis not present

## 2017-10-23 DIAGNOSIS — I4891 Unspecified atrial fibrillation: Secondary | ICD-10-CM | POA: Diagnosis not present

## 2017-10-23 DIAGNOSIS — R278 Other lack of coordination: Secondary | ICD-10-CM | POA: Diagnosis not present

## 2017-10-24 DIAGNOSIS — I4891 Unspecified atrial fibrillation: Secondary | ICD-10-CM | POA: Diagnosis not present

## 2017-10-24 DIAGNOSIS — I10 Essential (primary) hypertension: Secondary | ICD-10-CM | POA: Diagnosis not present

## 2017-10-24 DIAGNOSIS — M810 Age-related osteoporosis without current pathological fracture: Secondary | ICD-10-CM | POA: Diagnosis not present

## 2017-10-24 DIAGNOSIS — E1140 Type 2 diabetes mellitus with diabetic neuropathy, unspecified: Secondary | ICD-10-CM | POA: Diagnosis not present

## 2017-10-24 DIAGNOSIS — E039 Hypothyroidism, unspecified: Secondary | ICD-10-CM | POA: Diagnosis not present

## 2017-10-24 DIAGNOSIS — M6281 Muscle weakness (generalized): Secondary | ICD-10-CM | POA: Diagnosis not present

## 2017-10-24 DIAGNOSIS — Z950 Presence of cardiac pacemaker: Secondary | ICD-10-CM | POA: Diagnosis not present

## 2017-10-24 DIAGNOSIS — R278 Other lack of coordination: Secondary | ICD-10-CM | POA: Diagnosis not present

## 2017-10-25 DIAGNOSIS — I10 Essential (primary) hypertension: Secondary | ICD-10-CM | POA: Diagnosis not present

## 2017-10-25 DIAGNOSIS — I5032 Chronic diastolic (congestive) heart failure: Secondary | ICD-10-CM | POA: Diagnosis not present

## 2017-10-25 DIAGNOSIS — T148XXD Other injury of unspecified body region, subsequent encounter: Secondary | ICD-10-CM | POA: Diagnosis not present

## 2017-10-25 DIAGNOSIS — K921 Melena: Secondary | ICD-10-CM | POA: Diagnosis not present

## 2017-10-25 DIAGNOSIS — R278 Other lack of coordination: Secondary | ICD-10-CM | POA: Diagnosis not present

## 2017-10-25 DIAGNOSIS — M810 Age-related osteoporosis without current pathological fracture: Secondary | ICD-10-CM | POA: Diagnosis not present

## 2017-10-25 DIAGNOSIS — E1140 Type 2 diabetes mellitus with diabetic neuropathy, unspecified: Secondary | ICD-10-CM | POA: Diagnosis not present

## 2017-10-25 DIAGNOSIS — I4891 Unspecified atrial fibrillation: Secondary | ICD-10-CM | POA: Diagnosis not present

## 2017-10-25 DIAGNOSIS — E039 Hypothyroidism, unspecified: Secondary | ICD-10-CM | POA: Diagnosis not present

## 2017-10-25 DIAGNOSIS — Z950 Presence of cardiac pacemaker: Secondary | ICD-10-CM | POA: Diagnosis not present

## 2017-10-25 DIAGNOSIS — I481 Persistent atrial fibrillation: Secondary | ICD-10-CM | POA: Diagnosis not present

## 2017-10-25 DIAGNOSIS — M6281 Muscle weakness (generalized): Secondary | ICD-10-CM | POA: Diagnosis not present

## 2017-10-26 DIAGNOSIS — I10 Essential (primary) hypertension: Secondary | ICD-10-CM | POA: Diagnosis not present

## 2017-10-26 DIAGNOSIS — I4891 Unspecified atrial fibrillation: Secondary | ICD-10-CM | POA: Diagnosis not present

## 2017-10-26 DIAGNOSIS — M810 Age-related osteoporosis without current pathological fracture: Secondary | ICD-10-CM | POA: Diagnosis not present

## 2017-10-26 DIAGNOSIS — R278 Other lack of coordination: Secondary | ICD-10-CM | POA: Diagnosis not present

## 2017-10-26 DIAGNOSIS — E039 Hypothyroidism, unspecified: Secondary | ICD-10-CM | POA: Diagnosis not present

## 2017-10-26 DIAGNOSIS — Z950 Presence of cardiac pacemaker: Secondary | ICD-10-CM | POA: Diagnosis not present

## 2017-10-26 DIAGNOSIS — E1140 Type 2 diabetes mellitus with diabetic neuropathy, unspecified: Secondary | ICD-10-CM | POA: Diagnosis not present

## 2017-10-26 DIAGNOSIS — M6281 Muscle weakness (generalized): Secondary | ICD-10-CM | POA: Diagnosis not present

## 2017-10-27 DIAGNOSIS — I10 Essential (primary) hypertension: Secondary | ICD-10-CM | POA: Diagnosis not present

## 2017-10-27 DIAGNOSIS — R278 Other lack of coordination: Secondary | ICD-10-CM | POA: Diagnosis not present

## 2017-10-27 DIAGNOSIS — M810 Age-related osteoporosis without current pathological fracture: Secondary | ICD-10-CM | POA: Diagnosis not present

## 2017-10-27 DIAGNOSIS — Z950 Presence of cardiac pacemaker: Secondary | ICD-10-CM | POA: Diagnosis not present

## 2017-10-27 DIAGNOSIS — E039 Hypothyroidism, unspecified: Secondary | ICD-10-CM | POA: Diagnosis not present

## 2017-10-27 DIAGNOSIS — I4891 Unspecified atrial fibrillation: Secondary | ICD-10-CM | POA: Diagnosis not present

## 2017-10-27 DIAGNOSIS — M6281 Muscle weakness (generalized): Secondary | ICD-10-CM | POA: Diagnosis not present

## 2017-10-27 DIAGNOSIS — E1140 Type 2 diabetes mellitus with diabetic neuropathy, unspecified: Secondary | ICD-10-CM | POA: Diagnosis not present

## 2017-10-28 DIAGNOSIS — Z950 Presence of cardiac pacemaker: Secondary | ICD-10-CM | POA: Diagnosis not present

## 2017-10-28 DIAGNOSIS — E039 Hypothyroidism, unspecified: Secondary | ICD-10-CM | POA: Diagnosis not present

## 2017-10-28 DIAGNOSIS — I10 Essential (primary) hypertension: Secondary | ICD-10-CM | POA: Diagnosis not present

## 2017-10-28 DIAGNOSIS — M810 Age-related osteoporosis without current pathological fracture: Secondary | ICD-10-CM | POA: Diagnosis not present

## 2017-10-28 DIAGNOSIS — R278 Other lack of coordination: Secondary | ICD-10-CM | POA: Diagnosis not present

## 2017-10-28 DIAGNOSIS — E1140 Type 2 diabetes mellitus with diabetic neuropathy, unspecified: Secondary | ICD-10-CM | POA: Diagnosis not present

## 2017-10-28 DIAGNOSIS — I4891 Unspecified atrial fibrillation: Secondary | ICD-10-CM | POA: Diagnosis not present

## 2017-10-28 DIAGNOSIS — M6281 Muscle weakness (generalized): Secondary | ICD-10-CM | POA: Diagnosis not present

## 2017-10-29 DIAGNOSIS — I10 Essential (primary) hypertension: Secondary | ICD-10-CM | POA: Diagnosis not present

## 2017-10-29 DIAGNOSIS — E1140 Type 2 diabetes mellitus with diabetic neuropathy, unspecified: Secondary | ICD-10-CM | POA: Diagnosis not present

## 2017-10-29 DIAGNOSIS — M6281 Muscle weakness (generalized): Secondary | ICD-10-CM | POA: Diagnosis not present

## 2017-10-29 DIAGNOSIS — I4891 Unspecified atrial fibrillation: Secondary | ICD-10-CM | POA: Diagnosis not present

## 2017-10-29 DIAGNOSIS — M810 Age-related osteoporosis without current pathological fracture: Secondary | ICD-10-CM | POA: Diagnosis not present

## 2017-10-29 DIAGNOSIS — Z950 Presence of cardiac pacemaker: Secondary | ICD-10-CM | POA: Diagnosis not present

## 2017-10-29 DIAGNOSIS — E039 Hypothyroidism, unspecified: Secondary | ICD-10-CM | POA: Diagnosis not present

## 2017-10-29 DIAGNOSIS — R278 Other lack of coordination: Secondary | ICD-10-CM | POA: Diagnosis not present

## 2017-10-30 DIAGNOSIS — Z950 Presence of cardiac pacemaker: Secondary | ICD-10-CM | POA: Diagnosis not present

## 2017-10-30 DIAGNOSIS — E1140 Type 2 diabetes mellitus with diabetic neuropathy, unspecified: Secondary | ICD-10-CM | POA: Diagnosis not present

## 2017-10-30 DIAGNOSIS — R278 Other lack of coordination: Secondary | ICD-10-CM | POA: Diagnosis not present

## 2017-10-30 DIAGNOSIS — M810 Age-related osteoporosis without current pathological fracture: Secondary | ICD-10-CM | POA: Diagnosis not present

## 2017-10-30 DIAGNOSIS — M6281 Muscle weakness (generalized): Secondary | ICD-10-CM | POA: Diagnosis not present

## 2017-10-30 DIAGNOSIS — E039 Hypothyroidism, unspecified: Secondary | ICD-10-CM | POA: Diagnosis not present

## 2017-10-30 DIAGNOSIS — I10 Essential (primary) hypertension: Secondary | ICD-10-CM | POA: Diagnosis not present

## 2017-10-30 DIAGNOSIS — I4891 Unspecified atrial fibrillation: Secondary | ICD-10-CM | POA: Diagnosis not present

## 2017-10-31 DIAGNOSIS — M25521 Pain in right elbow: Secondary | ICD-10-CM | POA: Diagnosis not present

## 2017-10-31 DIAGNOSIS — M25511 Pain in right shoulder: Secondary | ICD-10-CM | POA: Diagnosis not present

## 2017-10-31 DIAGNOSIS — G4700 Insomnia, unspecified: Secondary | ICD-10-CM | POA: Diagnosis not present

## 2017-11-07 DIAGNOSIS — R062 Wheezing: Secondary | ICD-10-CM | POA: Diagnosis not present

## 2017-11-09 DIAGNOSIS — R2689 Other abnormalities of gait and mobility: Secondary | ICD-10-CM | POA: Diagnosis not present

## 2017-11-09 DIAGNOSIS — I509 Heart failure, unspecified: Secondary | ICD-10-CM | POA: Diagnosis not present

## 2017-11-09 DIAGNOSIS — I2510 Atherosclerotic heart disease of native coronary artery without angina pectoris: Secondary | ICD-10-CM | POA: Diagnosis not present

## 2017-11-15 ENCOUNTER — Ambulatory Visit: Payer: Medicare Other | Admitting: Interventional Cardiology

## 2017-11-15 DIAGNOSIS — E1121 Type 2 diabetes mellitus with diabetic nephropathy: Secondary | ICD-10-CM | POA: Diagnosis not present

## 2017-11-15 DIAGNOSIS — Q845 Enlarged and hypertrophic nails: Secondary | ICD-10-CM | POA: Diagnosis not present

## 2017-11-15 DIAGNOSIS — B351 Tinea unguium: Secondary | ICD-10-CM | POA: Diagnosis not present

## 2017-11-15 DIAGNOSIS — I739 Peripheral vascular disease, unspecified: Secondary | ICD-10-CM | POA: Diagnosis not present

## 2017-11-15 DIAGNOSIS — L603 Nail dystrophy: Secondary | ICD-10-CM | POA: Diagnosis not present

## 2017-11-16 DIAGNOSIS — I5032 Chronic diastolic (congestive) heart failure: Secondary | ICD-10-CM | POA: Diagnosis not present

## 2017-11-16 DIAGNOSIS — J189 Pneumonia, unspecified organism: Secondary | ICD-10-CM | POA: Diagnosis not present

## 2017-11-16 DIAGNOSIS — I2510 Atherosclerotic heart disease of native coronary artery without angina pectoris: Secondary | ICD-10-CM | POA: Diagnosis not present

## 2017-11-16 DIAGNOSIS — I481 Persistent atrial fibrillation: Secondary | ICD-10-CM | POA: Diagnosis not present

## 2017-11-19 ENCOUNTER — Encounter: Payer: Self-pay | Admitting: Interventional Cardiology

## 2017-11-19 ENCOUNTER — Ambulatory Visit: Payer: Medicare Other | Admitting: Interventional Cardiology

## 2017-11-19 NOTE — Progress Notes (Deleted)
Cardiology Office Note   Date:  11/19/2017   ID:  [name], DOB 03/23/1927, MRN [id number]  PCP:  [name]    No chief complaint on file.    Wt Readings from Last 3 Encounters:  10/12/17 100 lb (45.4 kg)  09/19/16 143 lb (64.9 kg)  07/06/16 145 lb (65.8 kg)       History of Present Illness: [name] is a 84 y.o. female  With a h/o persistent AF s/p AV node ablation, s/p PPM, chronic diastolic HF Who had a NSTEMI in 11/17.  She had 2 vessel CAD. She was declined CABG due to her frailty.  Echo showed: LV cavity size was normal. Systolic function was mildly to moderately reduced. The estimated ejection fraction was in the range of 40% to 45%. Akinesis of the inferolateral myocardium; consistent with infarction in the distribution of the left circumflex coronary artery.  She had PCI of the circ: Successful complex PCI to a 99% bifurcation stenosis in the proximal dominant left circumflex vessel with 99% stenosis in the OM1 vessel with 70% mid AV groove stenosis after the second marginal vessel, treated with PTCA of the obtuse marginal 1 vessel with a 99% stenosis being reduced to less than 5%, and ultimate tandem stenting of the proximal to mid AV groove circumflex with insertion of tandem 3.5 x 18 mm Resolute Onyx DES stents postdilated to 3.99 mm in the proximal stent and 3.80 mm in the second stent with the entire AV groove stenosis being reduced to 0% and resumption of brisk TIMI-3 flow.  This was followed by PCI of the LAD.  In May 2018, she was admitted with lower GI bleed: Acute lower GI bleed. Likely diverticular bleed, will continue close monitoring of hb and hct. No current indication for PRBC transfusion. No plans for invasive workup per GI.Question risk versus benefits of continuing Coumadin as outpatient noted.Cardiology was consulted. Determined benefits to anticoagulation outweighs risks. See below  2.Metabolic  encephalopathy/Dementia.  Coumadin was held for a few weeks and then it could be restarted per GI recs.  Given that she had the bleeding issue, there was not a clear cut right answer.      Past Medical History:  Diagnosis Date  . Anxiety   . Atrial fibrillation (HCC)    chronic Coumadin  . CHF (congestive heart failure) (HCC)   . Constipation    Rx'd Amitiza in 2007.    . Diverticulosis of colon    hx of diverticulitis.  dense, pan-diverticulosis on colonoscopies 2001, 2007.    . DM (diabetes mellitus) (HCC)   . Hyperlipidemia   . Hypertension   . Hypothyroidism   . Osteoporosis   . Tremor, essential   . Type II or unspecified type diabetes mellitus with neurological manifestations, not stated as uncontrolled(250.60)     Past Surgical History:  Procedure Laterality Date  . CARDIAC CATHETERIZATION N/A 04/20/2016   Procedure: Left Heart Cath and Coronary Angiography;  Surgeon: Yvonne Kendall, MD;  Location: [facility] INVASIVE CV LAB;  Service: Cardiovascular;  Laterality: N/A;  . CARDIAC CATHETERIZATION N/A 04/24/2016   Procedure: Coronary Stent Intervention;  Surgeon: Lennette Bihari, MD;  Location: [facility] INVASIVE CV LAB;  Service: Cardiovascular;  Laterality: N/A;  . CARDIAC CATHETERIZATION N/A 04/24/2016   Procedure: Coronary Balloon Angioplasty;  Surgeon: Lennette Bihari, MD;  Location: [facility] INVASIVE CV LAB;  Service: Cardiovascular;  Laterality: N/A;  . CARDIAC CATHETERIZATION N/A 04/26/2016   Procedure: Coronary Stent  Intervention;  Surgeon: Iran Ouch, MD;  Location: [facility] INVASIVE CV LAB;  Service: Cardiovascular;  Laterality: N/A;  . CATARACT EXTRACTION  2002  . IR THORACENTESIS ASP PLEURAL SPACE W/IMG GUIDE  06/07/2017  . L3 compression fraction    . PACEMAKER PLACEMENT    . TRANSTHORACIC ECHOCARDIOGRAM  2011, 2012     Current Outpatient Medications  Medication Sig Dispense Refill  . atorvastatin (LIPITOR) 40 MG tablet Take 1 tablet (40 mg total) by mouth daily at 6 PM.  (Patient taking differently: Take 40 mg by mouth every evening. ) 30 tablet 6  . bisacodyl (DULCOLAX) 10 MG suppository Place 10 mg rectally daily as needed for severe constipation.    Marland Kitchen CARTIA XT 300 MG 24 hr capsule take 1 capsule by mouth once daily 90 capsule 1  . docusate sodium (COLACE) 100 MG capsule Take 100 mg by mouth 2 (two) times daily as needed (constipation).    . ferrous sulfate 325 (65 FE) MG tablet take 1 tablet by mouth once daily 30 tablet 5  . furosemide (LASIX) 40 MG tablet take 1 tablet by mouth once daily (Patient taking differently: take 1 tablet by mouth twice daily) 90 tablet 0  . hydrOXYzine (ATARAX/VISTARIL) 10 MG tablet Take 10 mg by mouth 2 (two) times daily. For anxiety/itching (8a and 4p)    . hyoscyamine (LEVBID) 0.375 MG 12 hr tablet take 1 tablet by mouth twice a day ONLY IF NEEDED  **DO NOT TAKE EVERYDAY MUST LAST 30 DAYS** (Patient taking differently: take 1 tablet by mouth twice daily as needed for diverticulosis) 30 tablet 1  . ketotifen (ZADITOR) 0.025 % ophthalmic solution Place 1 drop into both eyes 2 (two) times daily as needed (itching).    Marland Kitchen levalbuterol (XOPENEX) 0.63 MG/3ML nebulizer solution Take 0.63 mg by nebulization every 6 (six) hours as needed for wheezing or shortness of breath.    . levothyroxine (SYNTHROID, LEVOTHROID) 100 MCG tablet Take 100 mcg by mouth daily before breakfast.    . LORazepam (ATIVAN) 0.5 MG tablet Take 1 tablet (0.5 mg total) by mouth 2 (two) times daily as needed for anxiety. 2 tablet 0  . Melatonin 3 MG TABS Take 6 mg by mouth at bedtime.    . metFORMIN (GLUCOPHAGE) 500 MG tablet Take 500 mg by mouth daily with breakfast.    . morphine (MSIR) 15 MG tablet Take 1 tablet (15 mg total) by mouth every 4 (four) hours as needed for severe pain. 2 tablet 0  . Nutritional Supplements (NUTRITIONAL SUPPLEMENT PO) Take 120 mLs by mouth 3 (three) times daily. "Med Pass"    . OXYGEN Inhale 2 L into the lungs as needed (shortness of  breath).    . polyethylene glycol powder (GLYCOLAX/MIRALAX) powder Take 17 g by mouth daily. Mix in liquid and drink    . potassium chloride SA (K-DUR,KLOR-CON) 20 MEQ tablet Take 20 mEq by mouth daily.    Marland Kitchen senna-docusate (SENNA S) 8.6-50 MG tablet Take 1 tablet by mouth daily as needed (constipation).    . sertraline (ZOLOFT) 100 MG tablet Take 100 mg by mouth daily.    Marland Kitchen warfarin (COUMADIN) 2.5 MG tablet Take 1 tablet (2.5 mg total) by mouth every evening. Please start on 10/25/17 30 tablet 0   No current facility-administered medications for this visit.     Allergies:   Codeine sulfate and Simvastatin    Social History:  The patient  reports that she has never smoked. She has never  used smokeless tobacco. She reports that she does not drink alcohol or use drugs.   Family History:  The patient's ***family history includes Heart attack in her father.    ROS:  Please see the history of present illness.   Otherwise, review of systems are positive for ***.   All other systems are reviewed and negative.    PHYSICAL EXAM: VS:  There were no vitals taken for this visit. , BMI There is no height or weight on file to calculate BMI. GEN: Well nourished, well developed, in no acute distress  HEENT: normal  Neck: no JVD, carotid bruits, or masses Cardiac: ***RRR; no murmurs, rubs, or gallops,no edema  Respiratory:  clear to auscultation bilaterally, normal work of breathing GI: soft, nontender, nondistended, + BS MS: no deformity or atrophy  Skin: warm and dry, no rash Neuro:  Strength and sensation are intact Psych: euthymic mood, full affect   EKG:   The ekg ordered today demonstrates ***   Recent Labs: 10/08/2017: ALT 15; TSH 3.744 10/09/2017: Magnesium 2.4 10/11/2017: BUN 45; Creatinine, Ser 1.21; Hemoglobin 10.4; Platelets 109; Potassium 3.4; Sodium 137   Lipid Panel    Component Value Date/Time   CHOL 176 04/21/2016 0019   TRIG 91 04/21/2016 0019   TRIG 306 (HH) 04/04/2006  1156   HDL 37 (L) 04/21/2016 0019   CHOLHDL 4.8 04/21/2016 0019   VLDL 18 04/21/2016 0019   LDLCALC 121 (H) 04/21/2016 0019   LDLDIRECT 123.0 11/11/2010 1126     Other studies Reviewed: Additional studies/ records that were reviewed today with results demonstrating: ***.   ASSESSMENT AND PLAN:  1. ***  2. *** 3. ***   Current medicines are reviewed at length with the patient today.  The patient concerns regarding her medicines were addressed.  The following changes have been made:  No change***  Labs/ tests ordered today include: *** No orders of the defined types were placed in this encounter.   Recommend 150 minutes/week of aerobic exercise Low fat, low carb, high fiber diet recommended  Disposition:   FU in ***   Signed, Lance MussJayadeep Arora Coakley, MD  11/19/2017 1:31 PM    [facility] [address], [place], [place]  [address] Phone: [phone]; Fax: [phone]

## 2017-11-23 ENCOUNTER — Encounter: Payer: Self-pay | Admitting: Interventional Cardiology

## 2017-11-23 DIAGNOSIS — E039 Hypothyroidism, unspecified: Secondary | ICD-10-CM | POA: Diagnosis not present

## 2017-11-27 DIAGNOSIS — R600 Localized edema: Secondary | ICD-10-CM | POA: Diagnosis not present

## 2017-11-27 DIAGNOSIS — I481 Persistent atrial fibrillation: Secondary | ICD-10-CM | POA: Diagnosis not present

## 2017-11-27 DIAGNOSIS — I5032 Chronic diastolic (congestive) heart failure: Secondary | ICD-10-CM | POA: Diagnosis not present

## 2017-11-27 DIAGNOSIS — E119 Type 2 diabetes mellitus without complications: Secondary | ICD-10-CM | POA: Diagnosis not present

## 2017-11-28 DIAGNOSIS — S81802D Unspecified open wound, left lower leg, subsequent encounter: Secondary | ICD-10-CM | POA: Diagnosis not present

## 2017-11-29 DIAGNOSIS — I469 Cardiac arrest, cause unspecified: Secondary | ICD-10-CM | POA: Diagnosis not present

## 2017-11-29 DIAGNOSIS — R402441 Other coma, without documented Glasgow coma scale score, or with partial score reported, in the field [EMT or ambulance]: Secondary | ICD-10-CM | POA: Diagnosis not present

## 2017-11-29 DIAGNOSIS — R0902 Hypoxemia: Secondary | ICD-10-CM | POA: Diagnosis not present

## 2017-11-29 DIAGNOSIS — M5489 Other dorsalgia: Secondary | ICD-10-CM | POA: Diagnosis not present

## 2017-11-29 DIAGNOSIS — R52 Pain, unspecified: Secondary | ICD-10-CM | POA: Diagnosis not present

## 2017-11-30 ENCOUNTER — Emergency Department (HOSPITAL_COMMUNITY): Payer: Medicare Other

## 2017-11-30 ENCOUNTER — Other Ambulatory Visit: Payer: Self-pay

## 2017-11-30 ENCOUNTER — Encounter (HOSPITAL_COMMUNITY): Payer: Self-pay

## 2017-11-30 ENCOUNTER — Inpatient Hospital Stay (HOSPITAL_COMMUNITY): Payer: Medicare Other

## 2017-11-30 ENCOUNTER — Inpatient Hospital Stay (HOSPITAL_COMMUNITY)
Admission: EM | Admit: 2017-11-30 | Discharge: 2017-12-20 | DRG: 917 | Disposition: E | Payer: Medicare Other | Source: Skilled Nursing Facility | Attending: Internal Medicine | Admitting: Internal Medicine

## 2017-11-30 DIAGNOSIS — I5043 Acute on chronic combined systolic (congestive) and diastolic (congestive) heart failure: Secondary | ICD-10-CM | POA: Diagnosis present

## 2017-11-30 DIAGNOSIS — I110 Hypertensive heart disease with heart failure: Secondary | ICD-10-CM | POA: Diagnosis not present

## 2017-11-30 DIAGNOSIS — Z7401 Bed confinement status: Secondary | ICD-10-CM

## 2017-11-30 DIAGNOSIS — Z888 Allergy status to other drugs, medicaments and biological substances status: Secondary | ICD-10-CM

## 2017-11-30 DIAGNOSIS — J90 Pleural effusion, not elsewhere classified: Secondary | ICD-10-CM | POA: Diagnosis not present

## 2017-11-30 DIAGNOSIS — Z66 Do not resuscitate: Secondary | ICD-10-CM | POA: Diagnosis present

## 2017-11-30 DIAGNOSIS — Z515 Encounter for palliative care: Secondary | ICD-10-CM | POA: Diagnosis not present

## 2017-11-30 DIAGNOSIS — N179 Acute kidney failure, unspecified: Secondary | ICD-10-CM | POA: Diagnosis not present

## 2017-11-30 DIAGNOSIS — E785 Hyperlipidemia, unspecified: Secondary | ICD-10-CM | POA: Diagnosis present

## 2017-11-30 DIAGNOSIS — F0150 Vascular dementia without behavioral disturbance: Secondary | ICD-10-CM

## 2017-11-30 DIAGNOSIS — E0840 Diabetes mellitus due to underlying condition with diabetic neuropathy, unspecified: Secondary | ICD-10-CM

## 2017-11-30 DIAGNOSIS — K5730 Diverticulosis of large intestine without perforation or abscess without bleeding: Secondary | ICD-10-CM | POA: Diagnosis not present

## 2017-11-30 DIAGNOSIS — R627 Adult failure to thrive: Secondary | ICD-10-CM | POA: Diagnosis present

## 2017-11-30 DIAGNOSIS — G250 Essential tremor: Secondary | ICD-10-CM | POA: Diagnosis present

## 2017-11-30 DIAGNOSIS — F0390 Unspecified dementia without behavioral disturbance: Secondary | ICD-10-CM | POA: Diagnosis not present

## 2017-11-30 DIAGNOSIS — I10 Essential (primary) hypertension: Secondary | ICD-10-CM | POA: Diagnosis not present

## 2017-11-30 DIAGNOSIS — T40601A Poisoning by unspecified narcotics, accidental (unintentional), initial encounter: Secondary | ICD-10-CM | POA: Diagnosis not present

## 2017-11-30 DIAGNOSIS — Z8249 Family history of ischemic heart disease and other diseases of the circulatory system: Secondary | ICD-10-CM

## 2017-11-30 DIAGNOSIS — I4891 Unspecified atrial fibrillation: Secondary | ICD-10-CM | POA: Diagnosis present

## 2017-11-30 DIAGNOSIS — Z9849 Cataract extraction status, unspecified eye: Secondary | ICD-10-CM | POA: Diagnosis not present

## 2017-11-30 DIAGNOSIS — M810 Age-related osteoporosis without current pathological fracture: Secondary | ICD-10-CM | POA: Diagnosis present

## 2017-11-30 DIAGNOSIS — J9601 Acute respiratory failure with hypoxia: Secondary | ICD-10-CM | POA: Diagnosis not present

## 2017-11-30 DIAGNOSIS — E875 Hyperkalemia: Secondary | ICD-10-CM | POA: Diagnosis present

## 2017-11-30 DIAGNOSIS — Z7901 Long term (current) use of anticoagulants: Secondary | ICD-10-CM

## 2017-11-30 DIAGNOSIS — E039 Hypothyroidism, unspecified: Secondary | ICD-10-CM | POA: Diagnosis present

## 2017-11-30 DIAGNOSIS — Z950 Presence of cardiac pacemaker: Secondary | ICD-10-CM | POA: Diagnosis not present

## 2017-11-30 DIAGNOSIS — R4182 Altered mental status, unspecified: Secondary | ICD-10-CM | POA: Diagnosis present

## 2017-11-30 DIAGNOSIS — T402X1S Poisoning by other opioids, accidental (unintentional), sequela: Secondary | ICD-10-CM | POA: Diagnosis not present

## 2017-11-30 DIAGNOSIS — Z9981 Dependence on supplemental oxygen: Secondary | ICD-10-CM | POA: Diagnosis not present

## 2017-11-30 DIAGNOSIS — I509 Heart failure, unspecified: Secondary | ICD-10-CM

## 2017-11-30 DIAGNOSIS — I482 Chronic atrial fibrillation: Secondary | ICD-10-CM

## 2017-11-30 DIAGNOSIS — I2721 Secondary pulmonary arterial hypertension: Secondary | ICD-10-CM | POA: Diagnosis not present

## 2017-11-30 DIAGNOSIS — J9621 Acute and chronic respiratory failure with hypoxia: Secondary | ICD-10-CM | POA: Diagnosis not present

## 2017-11-30 DIAGNOSIS — I480 Paroxysmal atrial fibrillation: Secondary | ICD-10-CM

## 2017-11-30 DIAGNOSIS — E1140 Type 2 diabetes mellitus with diabetic neuropathy, unspecified: Secondary | ICD-10-CM | POA: Diagnosis present

## 2017-11-30 DIAGNOSIS — F419 Anxiety disorder, unspecified: Secondary | ICD-10-CM | POA: Diagnosis present

## 2017-11-30 DIAGNOSIS — Z79890 Hormone replacement therapy (postmenopausal): Secondary | ICD-10-CM

## 2017-11-30 DIAGNOSIS — Z885 Allergy status to narcotic agent status: Secondary | ICD-10-CM

## 2017-11-30 DIAGNOSIS — E1149 Type 2 diabetes mellitus with other diabetic neurological complication: Secondary | ICD-10-CM | POA: Diagnosis not present

## 2017-11-30 LAB — COMPREHENSIVE METABOLIC PANEL
ALBUMIN: 2.6 g/dL — AB (ref 3.5–5.0)
ALT: 17 U/L (ref 0–44)
AST: 20 U/L (ref 15–41)
Alkaline Phosphatase: 83 U/L (ref 38–126)
Anion gap: 14 (ref 5–15)
BUN: 97 mg/dL — AB (ref 8–23)
CHLORIDE: 104 mmol/L (ref 98–111)
CO2: 22 mmol/L (ref 22–32)
CREATININE: 3.23 mg/dL — AB (ref 0.44–1.00)
Calcium: 8.8 mg/dL — ABNORMAL LOW (ref 8.9–10.3)
GFR calc Af Amer: 14 mL/min — ABNORMAL LOW (ref >60)
GFR calc non Af Amer: 12 mL/min — ABNORMAL LOW (ref >60)
GLUCOSE: 77 mg/dL (ref 70–99)
POTASSIUM: 7.1 mmol/L — AB (ref 3.5–5.1)
SODIUM: 140 mmol/L (ref 135–145)
TOTAL PROTEIN: 5.8 g/dL — AB (ref 6.5–8.1)
Total Bilirubin: 1.9 mg/dL — ABNORMAL HIGH (ref 0.3–1.2)

## 2017-11-30 LAB — I-STAT ARTERIAL BLOOD GAS, ED
ACID-BASE DEFICIT: 5 mmol/L — AB (ref 0.0–2.0)
Bicarbonate: 21.9 mmol/L (ref 20.0–28.0)
O2 Saturation: 90 %
TCO2: 23 mmol/L (ref 22–32)
pCO2 arterial: 44.7 mmHg (ref 32.0–48.0)
pH, Arterial: 7.296 — ABNORMAL LOW (ref 7.350–7.450)
pO2, Arterial: 64 mmHg — ABNORMAL LOW (ref 83.0–108.0)

## 2017-11-30 LAB — NA AND K (SODIUM & POTASSIUM), RAND UR
Potassium Urine: 66 mmol/L
Sodium, Ur: <10 mmol/L

## 2017-11-30 LAB — CBC WITH DIFFERENTIAL/PLATELET
Abs Immature Granulocytes: 0.2 10*3/uL — ABNORMAL HIGH (ref 0.0–0.1)
Basophils Absolute: 0 10*3/uL (ref 0.0–0.1)
Basophils Relative: 0 %
EOS ABS: 0 10*3/uL (ref 0.0–0.7)
EOS PCT: 0 %
HEMATOCRIT: 41.3 % (ref 36.0–46.0)
Hemoglobin: 12.3 g/dL (ref 12.0–15.0)
Immature Granulocytes: 2 %
Lymphocytes Relative: 7 %
Lymphs Abs: 0.7 10*3/uL (ref 0.7–4.0)
MCH: 32.1 pg (ref 26.0–34.0)
MCHC: 29.8 g/dL — AB (ref 30.0–36.0)
MCV: 107.8 fL — AB (ref 78.0–100.0)
MONOS PCT: 7 %
Monocytes Absolute: 0.7 10*3/uL (ref 0.1–1.0)
Neutro Abs: 8.3 10*3/uL — ABNORMAL HIGH (ref 1.7–7.7)
Neutrophils Relative %: 84 %
Platelets: 79 10*3/uL — ABNORMAL LOW (ref 150–400)
RBC: 3.83 MIL/uL — ABNORMAL LOW (ref 3.87–5.11)
RDW: 19.5 % — AB (ref 11.5–15.5)
WBC: 9.9 10*3/uL (ref 4.0–10.5)

## 2017-11-30 LAB — URINALYSIS, ROUTINE W REFLEX MICROSCOPIC
Bilirubin Urine: NEGATIVE
GLUCOSE, UA: NEGATIVE mg/dL
HGB URINE DIPSTICK: NEGATIVE
Ketones, ur: NEGATIVE mg/dL
Leukocytes, UA: NEGATIVE
NITRITE: NEGATIVE
Protein, ur: 100 mg/dL — AB
SPECIFIC GRAVITY, URINE: 1.013 (ref 1.005–1.030)
pH: 5 (ref 5.0–8.0)

## 2017-11-30 LAB — CBG MONITORING, ED
GLUCOSE-CAPILLARY: 65 mg/dL — AB (ref 70–99)
GLUCOSE-CAPILLARY: 64 mg/dL — AB (ref 70–99)
Glucose-Capillary: 192 mg/dL — ABNORMAL HIGH (ref 70–99)
Glucose-Capillary: 78 mg/dL (ref 70–99)

## 2017-11-30 LAB — POTASSIUM: Potassium: 7.2 mmol/L (ref 3.5–5.1)

## 2017-11-30 LAB — PROTIME-INR
INR: 3.73
INR: 4.18
Prothrombin Time: 36.7 seconds — ABNORMAL HIGH (ref 11.4–15.2)
Prothrombin Time: 40 seconds — ABNORMAL HIGH (ref 11.4–15.2)

## 2017-11-30 LAB — I-STAT TROPONIN, ED: Troponin i, poc: 0.06 ng/mL (ref 0.00–0.08)

## 2017-11-30 LAB — BRAIN NATRIURETIC PEPTIDE: B Natriuretic Peptide: 1174.2 pg/mL — ABNORMAL HIGH (ref 0.0–100.0)

## 2017-11-30 LAB — CREATININE, URINE, RANDOM: CREATININE, URINE: 78.1 mg/dL

## 2017-11-30 MED ORDER — FENTANYL CITRATE (PF) 100 MCG/2ML IJ SOLN: 12.5000 ug | INTRAMUSCULAR | Status: DC | PRN

## 2017-11-30 MED ORDER — LEVOTHYROXINE SODIUM 100 MCG IV SOLR
50.0000 ug | Freq: Every day | INTRAVENOUS | Status: DC
  Filled 2017-11-30: qty 5

## 2017-11-30 MED ORDER — HYDROMORPHONE HCL 1 MG/ML IJ SOLN: 1.0000 mg | INTRAMUSCULAR | Status: DC | PRN

## 2017-11-30 MED ORDER — DEXTROSE 50 % IV SOLN
50.0000 mL | Freq: Once | INTRAVENOUS | Status: AC
  Administered 2017-11-30: 50 mL via INTRAVENOUS
  Filled 2017-11-30: qty 50

## 2017-11-30 MED ORDER — SODIUM POLYSTYRENE SULFONATE 15 GM/60ML PO SUSP
45.0000 g | Freq: Once | ORAL | Status: AC
  Administered 2017-11-30: 45 g via RECTAL
  Filled 2017-11-30: qty 180

## 2017-11-30 MED ORDER — ONDANSETRON HCL 4 MG PO TABS: 4.0000 mg | ORAL_TABLET | Freq: Four times a day (QID) | ORAL | Status: DC | PRN

## 2017-11-30 MED ORDER — SODIUM CHLORIDE 0.9 % IV BOLUS
500.0000 mL | Freq: Once | INTRAVENOUS | Status: AC
  Administered 2017-11-30: 500 mL via INTRAVENOUS

## 2017-11-30 MED ORDER — DIPHENHYDRAMINE HCL 50 MG/ML IJ SOLN: 12.5000 mg | INTRAMUSCULAR | Status: DC | PRN

## 2017-11-30 MED ORDER — ACETAMINOPHEN 325 MG PO TABS: 650.0000 mg | ORAL_TABLET | Freq: Four times a day (QID) | ORAL | Status: DC | PRN

## 2017-11-30 MED ORDER — VITAMIN K1 10 MG/ML IJ SOLN
5.0000 mg | Freq: Once | INTRAVENOUS | Status: AC
  Administered 2017-11-30: 5 mg via INTRAVENOUS
  Filled 2017-11-30: qty 0.5

## 2017-11-30 MED ORDER — FUROSEMIDE 10 MG/ML IJ SOLN
160.0000 mg | Freq: Once | INTRAVENOUS | Status: DC
  Filled 2017-11-30: qty 16

## 2017-11-30 MED ORDER — ATROPINE SULFATE 1 % OP SOLN
4.0000 [drp] | OPHTHALMIC | Status: DC | PRN
  Filled 2017-11-30: qty 2

## 2017-11-30 MED ORDER — INSULIN ASPART 100 UNIT/ML ~~LOC~~ SOLN
10.0000 [IU] | Freq: Once | SUBCUTANEOUS | Status: AC
  Administered 2017-11-30: 10 [IU] via INTRAVENOUS
  Filled 2017-11-30: qty 1

## 2017-11-30 MED ORDER — DEXTROSE 50 % IV SOLN
INTRAVENOUS | Status: AC
  Administered 2017-11-30: 25 mL
  Filled 2017-11-30: qty 50

## 2017-11-30 MED ORDER — LORAZEPAM 2 MG/ML PO CONC: 1.0000 mg | ORAL | Status: DC | PRN

## 2017-11-30 MED ORDER — INSULIN ASPART 100 UNIT/ML ~~LOC~~ SOLN: 0.0000 [IU] | SUBCUTANEOUS | Status: DC

## 2017-11-30 MED ORDER — SODIUM POLYSTYRENE SULFONATE 15 GM/60ML PO SUSP: 45.0000 g | Freq: Once | ORAL | Status: DC

## 2017-11-30 MED ORDER — NALOXONE HCL 0.4 MG/ML IJ SOLN: 0.1000 mg | INTRAMUSCULAR | Status: DC | PRN

## 2017-11-30 MED ORDER — HALOPERIDOL 1 MG PO TABS: 0.5000 mg | ORAL_TABLET | ORAL | Status: DC | PRN

## 2017-11-30 MED ORDER — LORAZEPAM 2 MG/ML IJ SOLN: 1.0000 mg | INTRAMUSCULAR | Status: DC | PRN

## 2017-11-30 MED ORDER — LORAZEPAM 1 MG PO TABS: 1.0000 mg | ORAL_TABLET | ORAL | Status: DC | PRN

## 2017-11-30 MED ORDER — NALOXONE HCL 0.4 MG/ML IJ SOLN
INTRAMUSCULAR | Status: AC
  Administered 2017-11-30: 0.1 mg/mL
  Filled 2017-11-30: qty 1

## 2017-11-30 MED ORDER — HALOPERIDOL LACTATE 5 MG/ML IJ SOLN: 0.5000 mg | INTRAMUSCULAR | Status: DC | PRN

## 2017-11-30 MED ORDER — SODIUM CHLORIDE 0.9 % IV SOLN
1.0000 g | Freq: Once | INTRAVENOUS | Status: AC
  Administered 2017-11-30: 1 g via INTRAVENOUS
  Filled 2017-11-30: qty 10

## 2017-11-30 MED ORDER — POLYVINYL ALCOHOL 1.4 % OP SOLN
1.0000 [drp] | Freq: Four times a day (QID) | OPHTHALMIC | Status: DC | PRN
  Filled 2017-11-30: qty 15

## 2017-11-30 MED ORDER — HALOPERIDOL LACTATE 2 MG/ML PO CONC
0.5000 mg | ORAL | Status: DC | PRN
  Filled 2017-11-30: qty 0.3

## 2017-11-30 MED ORDER — NALOXONE HCL 0.4 MG/ML IJ SOLN
0.1000 mg | Freq: Once | INTRAMUSCULAR | Status: AC
  Administered 2017-11-30: 0.1 mg via INTRAVENOUS
  Filled 2017-11-30: qty 1

## 2017-11-30 MED ORDER — ALBUTEROL SULFATE (2.5 MG/3ML) 0.083% IN NEBU
2.5000 mg | INHALATION_SOLUTION | Freq: Once | RESPIRATORY_TRACT | Status: AC
  Administered 2017-11-30: 2.5 mg via RESPIRATORY_TRACT
  Filled 2017-11-30: qty 3

## 2017-11-30 MED ORDER — ACETAMINOPHEN 650 MG RE SUPP: 650.0000 mg | Freq: Four times a day (QID) | RECTAL | Status: DC | PRN

## 2017-11-30 MED ORDER — FUROSEMIDE 10 MG/ML IJ SOLN: 160.0000 mg | Freq: Once | INTRAVENOUS | Status: DC

## 2017-11-30 MED ORDER — FUROSEMIDE 10 MG/ML IJ SOLN
80.0000 mg | Freq: Once | INTRAMUSCULAR | Status: AC
  Administered 2017-11-30: 80 mg via INTRAVENOUS
  Filled 2017-11-30: qty 8

## 2017-11-30 MED ORDER — MORPHINE SULFATE (PF) 4 MG/ML IV SOLN
1.0000 mg | INTRAVENOUS | Status: DC | PRN
  Administered 2017-11-30: 1 mg via INTRAVENOUS
  Filled 2017-11-30: qty 1

## 2017-11-30 MED ORDER — GLYCOPYRROLATE 0.2 MG/ML IJ SOLN: 0.2000 mg | Freq: Four times a day (QID) | INTRAMUSCULAR | Status: DC | PRN

## 2017-11-30 MED ORDER — ONDANSETRON HCL 4 MG/2ML IJ SOLN: 4.0000 mg | Freq: Four times a day (QID) | INTRAMUSCULAR | Status: DC | PRN

## 2017-11-30 MED ORDER — NALOXONE HCL 0.4 MG/ML IJ SOLN
0.4000 mg | Freq: Once | INTRAMUSCULAR | Status: AC
  Administered 2017-11-30: 0.4 mg via INTRAVENOUS
  Filled 2017-11-30: qty 1

## 2017-11-30 MED ORDER — SODIUM BICARBONATE 8.4 % IV SOLN
50.0000 meq | Freq: Once | INTRAVENOUS | Status: AC
  Administered 2017-11-30: 50 meq via INTRAVENOUS
  Filled 2017-11-30: qty 50

## 2017-12-13 ENCOUNTER — Ambulatory Visit: Payer: Medicare Other | Admitting: Interventional Cardiology

## 2017-12-20 NOTE — ED Notes (Signed)
Palliative Care Nurse: Mariana ArnMelanie Henslee: [phone]: [phone]...Marland Kitchen.Marland Kitchen.Marland Kitchen.[phone]

## 2017-12-20 NOTE — H&P (Addendum)
History and Physical    [name] EAV:[id number] DOB: 08-20-1926 DOA: 2018/02/20  PCP: [name]  Patient coming from: SNF  I have personally briefly reviewed patient[facility]  Chief Complaint: AMS  HPI: [name] is a 84 y.o. female with medical history significant of A.Fib on chronic coumadin, PPM, ICM with EF 40-45% in 2017, dementia, chronic back pain.  Patient presents to ED this morning with AMS and worsening peripheral edema.  Edema worsened over the prior 3 days and on day of presentation, extended from b/l LE's to b/l UE's also.  Prior to ED arrival, she received narcotics for back pain and was later noted to have decreased level of consciousness.   ED Course: In ED, she was initially hypersomnolent, this progressed to agonal respirations at a rate of 8 per min.  As a result she was given a dose of 0.4mg  narcan and had marked improvement in mental status, although now she is in severe back pain and uncomfortable.  Additionally, she has hyperkalemia (K 7.1) and AKI (SCr 3.23).  She received temporizing measures for hyperkalemia in ED.  BNP also elevated at 1, 174. CXR demonstrated b/l pleural effusions with atelectasis R > L.  Initially there was some confusion regarding code status and granddaughter wanted her full code.  So PCCM was consulted given high risk of deterioration.  After discussion with PCCM patient has been made DNR/DNI.  They do still want full medical therapy at this point however, including consult with nephrology.  And dialysis if indicated and she is a candidate (they understand that she may not be).   Review of Systems: Unable to perform due to dementia.  Past Medical History:  Diagnosis Date  . Anxiety   . Atrial fibrillation (HCC)    chronic Coumadin  . CHF (congestive heart failure) (HCC)   . Constipation    Rx'd Amitiza in 2007.    . Diverticulosis of colon    hx of  diverticulitis.  dense, pan-diverticulosis on colonoscopies 2001, 2007.    . DM (diabetes mellitus) (HCC)   . Hyperlipidemia   . Hypertension   . Hypothyroidism   . Osteoporosis   . Tremor, essential   . Type II or unspecified type diabetes mellitus with neurological manifestations, not stated as uncontrolled(250.60)     Past Surgical History:  Procedure Laterality Date  . CARDIAC CATHETERIZATION N/A 04/20/2016   Procedure: Left Heart Cath and Coronary Angiography;  Surgeon: Yvonne Kendallhristopher End, MD;  [facility];  Service: Cardiovascular;  Laterality: N/A;  . CARDIAC CATHETERIZATION N/A 04/24/2016   Procedure: Coronary Stent Intervention;  Surgeon: Lennette Biharihomas A Kelly, MD;  Location: [facility] INVASIVE CV LAB;  Service: Cardiovascular;  Laterality: N/A;  . CARDIAC CATHETERIZATION N/A 04/24/2016   Procedure: Coronary Balloon Angioplasty;  Surgeon: Lennette Biharihomas A Kelly, MD;  Location: [facility] INVASIVE CV LAB;  Service: Cardiovascular;  Laterality: N/A;  . CARDIAC CATHETERIZATION N/A 04/26/2016   Procedure: Coronary Stent Intervention;  Surgeon: Iran OuchMuhammad A Arida, MD;  Location: [facility] INVASIVE CV LAB;  Service: Cardiovascular;  Laterality: N/A;  . CATARACT EXTRACTION  2002  . IR THORACENTESIS ASP PLEURAL SPACE W/IMG GUIDE  06/07/2017  . L3 compression fraction    . PACEMAKER PLACEMENT    . TRANSTHORACIC ECHOCARDIOGRAM  2011, 2012     reports that she has never smoked. She has never used smokeless tobacco. She reports that she does not drink alcohol or use drugs.  Allergies  Allergen Reactions  . Codeine Sulfate Shortness Of Breath and Other (See Comments)    Felt funny  . Simvastatin Other (See Comments)     choking, acid reflux    Family History  Problem Relation Age of Onset  . Heart attack Father   . Colon cancer Neg Hx      Prior to Admission medications   Medication Sig Start Date End Date Taking? Authorizing Provider  atorvastatin (LIPITOR) 40 MG tablet Take 1 tablet (40 mg total) by  mouth daily at 6 PM. Patient taking differently: Take 40 mg by mouth every evening.  05/01/16  Yes Arty Baumgartner, NP  bisacodyl (DULCOLAX) 10 MG suppository Place 10 mg rectally daily as needed for severe constipation.   Yes [provider]  CARTIA XT 300 MG 24 hr capsule take 1 capsule by mouth once daily 03/09/16  Yes Gordy Savers, MD  docusate sodium (COLACE) 100 MG capsule Take 100 mg by mouth 2 (two) times daily as needed (constipation).   Yes [provider]  ferrous sulfate 325 (65 FE) MG tablet take 1 tablet by mouth once daily 03/31/16  Yes Gordy Savers, MD  furosemide (LASIX) 40 MG tablet take 1 tablet by mouth once daily Patient taking differently: take 1 tablet by mouth twice daily 06/12/16  Yes Gordy Savers, MD  hydrOXYzine (ATARAX/VISTARIL) 10 MG tablet Take 10 mg by mouth 2 (two) times daily. For anxiety/itching (8a and 4p)   Yes [provider]  ketotifen (ZADITOR) 0.025 % ophthalmic solution Place 1 drop into both eyes 2 (two) times daily as needed (itching).   Yes [provider]  levalbuterol (XOPENEX) 0.63 MG/3ML nebulizer solution Take 0.63 mg by nebulization every 6 (six) hours as needed for wheezing or shortness of breath.   Yes [provider]  levothyroxine (SYNTHROID, LEVOTHROID) 100 MCG tablet Take 100 mcg by mouth daily before breakfast.   Yes [provider]  LORazepam (ATIVAN) 0.5 MG tablet Take 1 tablet (0.5 mg total) by mouth 2 (two) times daily as needed for anxiety. 10/11/17  Yes Jerald Kief, MD  Melatonin 3 MG TABS Take 6 mg by mouth at bedtime.   Yes [provider]  metFORMIN (GLUCOPHAGE) 500 MG tablet Take 500 mg by mouth daily with breakfast.   Yes [provider]  mirtazapine (REMERON) 7.5 MG tablet Take 7.5 mg by mouth at bedtime.   Yes [provider]  morphine (MSIR) 15 MG tablet Take 1 tablet (15 mg total) by mouth every 4 (four) hours as needed  for severe pain. 10/11/17  Yes Jerald Kief, MD  neomycin-bacitracin-polymyxin (NEOSPORIN) ointment Apply 1 application topically daily.   Yes [provider]  Nutritional Supplements (NUTRITIONAL SUPPLEMENT PO) Take 120 mLs by mouth 3 (three) times daily. "Med Pass"   Yes [provider]  polyethylene glycol powder (GLYCOLAX/MIRALAX) powder Take 17 g by mouth daily. Mix in liquid and drink   Yes [provider]  potassium chloride SA (K-DUR,KLOR-CON) 20 MEQ tablet Take 20 mEq by mouth daily.   Yes [provider]  senna-docusate (SENNA S) 8.6-50 MG tablet Take 1 tablet by mouth daily as needed (constipation).   Yes [provider]  sertraline (ZOLOFT) 100 MG tablet Take 100 mg by mouth daily.   Yes [provider]  traMADol (ULTRAM) 50 MG tablet Take 50 mg by mouth every 6 (six) hours as needed for moderate pain.   Yes [provider]  warfarin (COUMADIN) 2.5 MG tablet Take 1 tablet (2.5 mg total) by mouth every evening. Please start on 10/25/17 10/25/17  Yes Jerald Kief, MD  hyoscyamine (LEVBID) 0.375 MG 12 hr tablet take 1 tablet by mouth twice a day ONLY IF NEEDED  **DO NOT TAKE EVERYDAY MUST LAST 30 DAYS** Patient not taking: Reported on 25-Dec-2017 12/20/15   Gordy Savers, MD  OXYGEN Inhale 2 L into the lungs as needed (shortness of breath).    [provider]    Physical Exam: Vitals:   25-Dec-2017 0130 12-25-17 0200 2017/12/25 0230 12/25/2017 0300  BP:  (!) 104/55 (!) 109/57   Pulse: 70 70 70 69  Resp:  10 (!) 9   Temp:      TempSrc:      SpO2: 94% 93% 93% 95%    Constitutional: Uncomfortable post narcan Eyes: PERRL, lids and conjunctivae normal ENMT: Mucous membranes are moist. Posterior pharynx clear of any exudate or lesions.Normal dentition.  Neck: normal, supple, no masses, no thyromegaly Respiratory: Wet breath sounds, tachypnea Cardiovascular: IRR, 4+ pitting edema of arms, legs Abdomen: no  tenderness, no masses palpated. No hepatosplenomegaly. Bowel sounds positive.  Musculoskeletal: no clubbing / cyanosis. No joint deformity upper and lower extremities. Good ROM, no contractures. Normal muscle tone.  Skin: no rashes, lesions, ulcers. No induration Neurologic: CN 2-12 grossly intact. Sensation intact, DTR normal. Strength 5/5 in all 4.  Psychiatric: Demented   Labs on Admission: I have personally reviewed following labs and imaging studies  CBC: Recent Labs  Lab 25-Dec-2017 0110  WBC 9.9  NEUTROABS 8.3*  HGB 12.3  HCT 41.3  MCV 107.8*  PLT 79*   Basic Metabolic Panel: Recent Labs  Lab 12/25/2017 0110  NA 140  K 7.1*  CL 104  CO2 22  GLUCOSE 77  BUN 97*  CREATININE 3.23*  CALCIUM 8.8*   GFR: CrCl cannot be calculated (Unknown ideal weight.). Liver Function Tests: Recent Labs  Lab 25-Dec-2017 0110  AST 20  ALT 17  ALKPHOS 83  BILITOT 1.9*  PROT 5.8*  ALBUMIN 2.6*   No results for input(s): LIPASE, AMYLASE in the last 168 hours. No results for input(s): AMMONIA in the last 168 hours. Coagulation Profile: Recent Labs  Lab 12-25-17 0110  INR 3.73   Cardiac Enzymes: No results for input(s): CKTOTAL, CKMB, CKMBINDEX, TROPONINI in the last 168 hours. BNP (last 3 results) No results for input(s): PROBNP in the last 8760 hours. HbA1C: No results for input(s): HGBA1C in the last 72 hours. CBG: No results for input(s): GLUCAP in the last 168 hours. Lipid Profile: No results for input(s): CHOL, HDL, LDLCALC, TRIG, CHOLHDL, LDLDIRECT in the last 72 hours. Thyroid Function Tests: No results for input(s): TSH, T4TOTAL, FREET4, T3FREE, THYROIDAB in the last 72 hours. Anemia Panel: No results for input(s): VITAMINB12, FOLATE, FERRITIN, TIBC, IRON, RETICCTPCT in the last 72 hours. Urine analysis:    Component Value Date/Time   COLORURINE AMBER (A) 12-25-17 0217   APPEARANCEUR HAZY (A) 12-25-2017 0217   LABSPEC 1.013 12/25/2017 0217   PHURINE 5.0  2017-12-25 0217   GLUCOSEU NEGATIVE 12/25/17 0217   GLUCOSEU NEGATIVE 04/04/2006 1156   HGBUR NEGATIVE December 25, 2017 0217   HGBUR negative 11/03/2009 1400   BILIRUBINUR NEGATIVE 12-25-17 0217   BILIRUBINUR n 05/24/2016 1250   KETONESUR NEGATIVE Dec 25, 2017 0217   PROTEINUR 100 (A) 12-25-2017 0217   UROBILINOGEN 1.0 05/24/2016 1250   UROBILINOGEN 0.2 03/19/2014 1148   NITRITE NEGATIVE 12/25/17 0217  LEUKOCYTESUR NEGATIVE 12/19/17 0217    Radiological Exams on Admission: Dg Chest Port 1 View  Result Date: Dec 19, 2017 CLINICAL DATA:  84 year old female with extremity swelling. EXAM: PORTABLE CHEST 1 VIEW COMPARISON:  Chest radiograph dated 06/07/2017 FINDINGS: Bilateral pleural effusions, right greater left and increased compared to the prior radiograph. There is hazy opacity throughout the right lung which may be related to a moderate size right pleural effusion and associated atelectatic changes of the right lung versus pneumonia. Clinical correlation is recommended. There is no pneumothorax. There is cardiomegaly with slight interval increase in the cardiopericardial silhouette compared to the prior radiograph. The apparent increase in the cardiac size may be somewhat positional as the patient is slightly rotated. Left pectoral pacemaker device. No acute osseous pathology. IMPRESSION: Bilateral pleural effusions. Interval increase in the size of the right pleural effusion with associated atelectatic changes of the right lung versus pneumonia. Clinical correlation is recommended. Cardiomegaly. Electronically Signed   By: Elgie Collard M.D.   On: 2017/12/19 01:42    EKG: Independently reviewed.  Assessment/Plan Principal Problem:   Acute kidney failure (HCC) Active Problems:   Diabetes mellitus with neuropathy (HCC)   Essential hypertension   ATRIAL FIBRILLATION   PACEMAKER, PERMANENT   Acute on chronic combined systolic and diastolic CHF (congestive heart failure) (HCC)    Dementia   Hyperkalemia, diminished renal excretion   Overdose opiate, accidental or unintentional, initial encounter (HCC)   Acute respiratory failure with hypoxia (HCC)    1. AKF - ? Cardiorenal 1. Patient is profoundly fluid overloaded, lasix as below 2. Renal US ordered 3. Lasix as below 4. Nephrology consult in AM 2. Hyperkalemia - 1. Aggressive treatment for K 7.1 started in ED 2. Tele monitor 3. Repeat K Q4H as per hyperkalemia protocol 3. Acute on chronic CHF - 1. Will try 80mg  IV lasix in ED now 2. If no response, then may need dialysis, call nephrology 4. Acute respiratory failure with hypoxia - 1. Attempting rescue BIPAP to see if patient can tolerate 2. Day PCCM team to assess chest under US guidance and consider diagnostic / therapeutic thoracentesis. 5. Opiate overdose - 1. Due to not excreting from AKF 2. Reversed with narcan 3. If she becomes somnolent again, use lower dose than 0.4 or use gtt, patient having severe back pain now (takes narcs for chronic severe back pain). 4. Now still having severe back pain 2 hours after Narcan 6. A.Fib - 1. INR supratheraputic currently 2. Will hold off on ordering coumadin for the moment as she may need vas-cath for dialysis, and/or thoracentesis. 3. Also has thrombocytopenia with platelets 79 at the moment. 4. PCCM ordering small dose of Vit K 7. HTN - 1. Holding home BP meds 8. Hypothyroidism - 1. Will convert synthroid to IV 9. Dementia - chronic and baseline 10. DM2 - 1. Hold metformin 2. Sensitive SSI Q4H  DVT prophylaxis: INR supratheraputic, and has thrombocytopenia too Code Status: DNR Family Communication: Family at bedside Disposition Plan: SNF after admit Consults called: PCCM, call nephrology in AM, Pal care consult put in to Epic. Admission status: Admit to inpatient   Hillary Bow DO [facility] Pager [phone] Only works nights!  If 7AM-7PM, please contact the primary day team  physician taking care of patient  [email or web address] Password [facility]  19-Dec-2017, 4:01 AM

## 2017-12-20 NOTE — ED Notes (Signed)
Family in room at bedside

## 2017-12-20 NOTE — ED Notes (Signed)
MD paed about low CBG. Will continue to montior

## 2017-12-20 NOTE — ED Notes (Signed)
Gardner, MD at bedside.  

## 2017-12-20 NOTE — ED Notes (Signed)
X-ray at bedside

## 2017-12-20 NOTE — ED Triage Notes (Signed)
Per [facility], pt arrives from [facility] for all four extremity swelling. Staff unable to tell when swelling started but reported its worsened. Pt has hx of CHF. Pt c/o back pain r/t being moved. Pt on morphine. Staff reports increased lethargy. Hx of dementia. Pt always on 4L [place], placed on simple mask r/t mouth breathing

## 2017-12-20 NOTE — Discharge Summary (Addendum)
Discharge Summary  [name]:[id number] DOB: 09-23-1926  PCP: No primary care provider on file.  Admit date: Dec 15, 2017 Time of Death: 11;58 am on  12-15-2017  Time spent: more than 2hrs   Discharge Diagnoses:  Active Hospital Problems   Diagnosis Date Noted  . Acute kidney failure (HCC) 12-15-2017  . Acute on chronic combined systolic and diastolic CHF (congestive heart failure) (HCC) 12-15-2017  . Dementia 12/15/17  . Hyperkalemia, diminished renal excretion December 15, 2017  . Overdose opiate, accidental or unintentional, initial encounter (HCC) December 15, 2017  . Acute respiratory failure with hypoxia (HCC) 15-Dec-2017  . Comfort measures only status 2017/12/15  . PACEMAKER, PERMANENT 10/13/2008  . ATRIAL FIBRILLATION 01/02/2007  . Essential hypertension 01/02/2007  . Diabetes mellitus with neuropathy (HCC) 01/02/2007    Resolved Hospital Problems  No resolved problems to display.     History of present illness: (per admitting MD Dr Julian Reil) [facility]CP: [name]  Patient coming from: SNF  I have personally briefly reviewed patient's old medical records in [facility]  Chief Complaint: AMS  HPI: [name] is a 84 y.o. female with medical history significant of A.Fib on chronic coumadin, PPM, ICM with EF 40-45% in 2017, dementia, chronic back pain.  Patient presents to ED this morning with AMS and worsening peripheral edema.  Edema worsened over the prior 3 days and on day of presentation, extended from b/l LE's to b/l UE's also. Prior to ED arrival, she received narcotics for back pain and was later noted to have decreased level of consciousness.   ED Course: In ED, she was initially hypersomnolent, this progressed to agonal respirations at a rate of 8 per min.  As a result she was given a dose of 0.4mg  narcan and had marked improvement in mental status, although now she is in severe back pain and uncomfortable.  Additionally, she  has hyperkalemia (K 7.1) and AKI (SCr 3.23).  She received temporizing measures for hyperkalemia in ED.  BNP also elevated at 1, 174. CXR demonstrated b/l pleural effusions with atelectasis R > L.  Initially there was some confusion regarding code status and granddaughter wanted her full code.  So PCCM was consulted given high risk of deterioration.  After discussion with PCCM patient has been made DNR/DNI.  They do still want full medical therapy at this point however, including consult with nephrology.  And dialysis if indicated and she is a candidate (they understand that she may not be).     Hospital Course:  Principal Problem:   Acute kidney failure (HCC) Active Problems:   Diabetes mellitus with neuropathy (HCC)   Essential hypertension   ATRIAL FIBRILLATION   PACEMAKER, PERMANENT   Acute on chronic combined systolic and diastolic CHF (congestive heart failure) (HCC)   Dementia   Hyperkalemia, diminished renal excretion   Overdose opiate, accidental or unintentional, initial encounter (HCC)   Acute respiratory failure with hypoxia (HCC)   Comfort measures only status    Acute renal failure/ hyperkalemia -She received Lasix, sodium bicarb, insulin, albuterol, calcium gluconate and Kayexalate - potassium worsened despite aggressive therapy, case discussed with nephrology Dr. Juel Burrow who state patient is not a candidate for hemodialysis due to multiple comorbidity overall frailty.  Acute respiratory failure with hypoxia -Right-sided pleural effusion -Case discussed with critical care Dr. Delton Coombes who recommended palliative care currently not a candidate for invasive procedure due to elevated INR  Therapeutic INR on presentation -INR rising despite of vitamin K   Dementia with progressive decline  over the year, family reported patient has 50 pounds of weight loss this year, she stopped eating and bedbound for the last 2 weeks.  I have discussed with family extensively early in  the morning that at this point patient will not benefit from aggressive measurement.  They agreed with palliative care consult and considered comfort measures.  I have received a second call from the nurse  After the first family meeting that patient continued to decline, repeat labs showed worsening of potassium and INR.  I Had another meeting with family members in the ED, family decided to transition care to full comfort measures.  Palliative care made aware that patient is transitioned to full comfort measures. Palliative care is on board for symptom management and end-of-life care.   Patient passed away at 1158.  Procedures:  bipap  Consultations:  Critical care  Curb sided nephrology Dr Juel BurrowLin  Palliative care   Allergies as of Jul 21, 2017      Reactions   Codeine Sulfate Shortness Of Breath, Other (See Comments)   Felt funny   Simvastatin Other (See Comments)    choking, acid reflux      Medication List    STOP taking these medications   atorvastatin 40 MG tablet Commonly known as:  LIPITOR   bisacodyl 10 MG suppository Commonly known as:  DULCOLAX   CARTIA XT 300 MG 24 hr capsule Generic drug:  diltiazem   docusate sodium 100 MG capsule Commonly known as:  COLACE   ferrous sulfate 325 (65 FE) MG tablet   furosemide 40 MG tablet Commonly known as:  LASIX   hydrOXYzine 10 MG tablet Commonly known as:  ATARAX/VISTARIL   ketotifen 0.025 % ophthalmic solution Commonly known as:  ZADITOR   levalbuterol 0.63 MG/3ML nebulizer solution Commonly known as:  XOPENEX   levothyroxine 100 MCG tablet Commonly known as:  SYNTHROID, LEVOTHROID   LORazepam 0.5 MG tablet Commonly known as:  ATIVAN   Melatonin 3 MG Tabs   metFORMIN 500 MG tablet Commonly known as:  GLUCOPHAGE   mirtazapine 7.5 MG tablet Commonly known as:  REMERON   morphine 15 MG tablet Commonly known as:  MSIR   neomycin-bacitracin-polymyxin ointment Commonly known as:  NEOSPORIN   NUTRITIONAL  SUPPLEMENT PO   OXYGEN   polyethylene glycol powder powder Commonly known as:  GLYCOLAX/MIRALAX   potassium chloride SA 20 MEQ tablet Commonly known as:  K-DUR,KLOR-CON   SENNA S 8.6-50 MG tablet Generic drug:  senna-docusate   sertraline 100 MG tablet Commonly known as:  ZOLOFT   traMADol 50 MG tablet Commonly known as:  ULTRAM   warfarin 2.5 MG tablet Commonly known as:  COUMADIN     ASK your doctor about these medications   hyoscyamine 0.375 MG 12 hr tablet Commonly known as:  LEVBID take 1 tablet by mouth twice a day ONLY IF NEEDED  **DO NOT TAKE EVERYDAY MUST LAST 30 DAYS**      Allergies  Allergen Reactions  . Codeine Sulfate Shortness Of Breath and Other (See Comments)    Felt funny  . Simvastatin Other (See Comments)     choking, acid reflux      The results of significant diagnostics from this hospitalization (including imaging, microbiology, ancillary and laboratory) are listed below for reference.    Significant Diagnostic Studies: Dg Chest Port 1 View  Result Date: Jul 21, 2017 CLINICAL DATA:  84 year old female with extremity swelling. EXAM: PORTABLE CHEST 1 VIEW COMPARISON:  Chest radiograph dated 06/07/2017 FINDINGS: Bilateral pleural  effusions, right greater left and increased compared to the prior radiograph. There is hazy opacity throughout the right lung which may be related to a moderate size right pleural effusion and associated atelectatic changes of the right lung versus pneumonia. Clinical correlation is recommended. There is no pneumothorax. There is cardiomegaly with slight interval increase in the cardiopericardial silhouette compared to the prior radiograph. The apparent increase in the cardiac size may be somewhat positional as the patient is slightly rotated. Left pectoral pacemaker device. No acute osseous pathology. IMPRESSION: Bilateral pleural effusions. Interval increase in the size of the right pleural effusion with associated atelectatic  changes of the right lung versus pneumonia. Clinical correlation is recommended. Cardiomegaly. Electronically Signed   By: Elgie Collard M.D.   On: 2017/12/14 01:42    Microbiology: No results found for this or any previous visit (from the past 240 hour(s)).   Labs: Basic Metabolic Panel: Recent Labs  Lab 14-Dec-2017 0110 12-14-2017 0453 2017/12/14 0945  NA 140  --   --   K 7.1* 7.2* >7.5*  CL 104  --   --   CO2 22  --   --   GLUCOSE 77  --   --   BUN 97*  --   --   CREATININE 3.23*  --   --   CALCIUM 8.8*  --   --    Liver Function Tests: Recent Labs  Lab Dec 14, 2017 0110  AST 20  ALT 17  ALKPHOS 83  BILITOT 1.9*  PROT 5.8*  ALBUMIN 2.6*   No results for input(s): LIPASE, AMYLASE in the last 168 hours. No results for input(s): AMMONIA in the last 168 hours. CBC: Recent Labs  Lab 2017/12/14 0110  WBC 9.9  NEUTROABS 8.3*  HGB 12.3  HCT 41.3  MCV 107.8*  PLT 79*   Cardiac Enzymes: No results for input(s): CKTOTAL, CKMB, CKMBINDEX, TROPONINI in the last 168 hours. BNP: BNP (last 3 results) Recent Labs    12-14-17 0110  BNP 1,174.2*    ProBNP (last 3 results) No results for input(s): PROBNP in the last 8760 hours.  CBG: Recent Labs  Lab 12-14-2017 0358 12/14/17 0557 12-14-17 0659 2017/12/14 0807  GLUCAP 192* 65* 78 64*       Signed:  Albertine Grates MD, PhD  [facility] 12/14/2017, 6:38 PM

## 2017-12-20 NOTE — ED Notes (Signed)
ED Provider at bedside. 

## 2017-12-20 NOTE — ED Notes (Signed)
Increased alertness and respiratory rate since narcan.

## 2017-12-20 NOTE — Progress Notes (Signed)
2 rounds of 0.1mg  narcan (0.2mg  total) given with increased responsiveness, alertness, and respiratory drive.  Putting in Narcan 0.1mg  PRN order for future dose needs.  Monitor patient closely, consider narcan gtt if further rounds needed.

## 2017-12-20 NOTE — ED Notes (Signed)
Per Julian ReilGardner, MD hold bolus

## 2017-12-20 NOTE — ED Notes (Signed)
Lab to add on urine.  °

## 2017-12-20 NOTE — ED Provider Notes (Signed)
[facility] EMERGENCY DEPARTMENT Provider Note   CSN: [id number] Arrival date & time: December 15, 2017  0000     History   Chief Complaint No chief complaint on file.   HPI [name] is a 84 y.o. female.  The history is provided by the nursing home and a relative. The history is limited by the condition of the patient (Dementia).  She has history of diabetes, hypertension, hyperlipidemia, heart failure, atrial fibrillation, anticoagulated on warfarin and was sent to the ED because of altered level of consciousness and peripheral edema.  She has been noted to have increased swelling in her legs over the last 3 days, and today, swelling seems to have shifted to her arms.  She is chronically on oxygen but has not been any more dyspneic than normal.  Today, she did receive a dose of narcotic for chronic back pain and became somewhat sedated.  This is not unusual, but her level of consciousness has not come back to baseline.  There has not been any known fever or chills.  Granddaughter who is power of attorney states that patient is normally able to hold a conversation although she is normally quite confused.  Granddaughter is also concerned that patient is listed in the nursing home as DNR status, but she is not aware of signing anything to make patient DNR and states that this is not what her wishes are.  Past Medical History:  Diagnosis Date  . Anxiety   . Atrial fibrillation (HCC)    chronic Coumadin  . CHF (congestive heart failure) (HCC)   . Constipation    Rx'd Amitiza in 2007.    . Diverticulosis of colon    hx of diverticulitis.  dense, pan-diverticulosis on colonoscopies 2001, 2007.    . DM (diabetes mellitus) (HCC)   . Hyperlipidemia   . Hypertension   . Hypothyroidism   . Osteoporosis   . Tremor, essential   . Type II or unspecified type diabetes mellitus with neurological manifestations, not stated as uncontrolled(250.60)     Patient Active Problem List    Diagnosis Date Noted  . Acute GI bleeding 10/08/2017  . Pleural effusion 10/08/2017  . CHB (complete heart block) (HCC) post AVN ablation 04/22/2016  . PAH (pulmonary artery hypertension) (HCC) 04/22/2016  . Encounter for therapeutic drug monitoring 09/04/2013  . Long term current use of anticoagulant therapy 01/06/2013  . Renal insufficiency 09/12/2010  . Hearing loss 11/08/2009  . PACEMAKER, PERMANENT 10/13/2008  . OSTEOPOROSIS 07/05/2007  . Dyslipidemia 05/03/2007  . TREMOR, ESSENTIAL 02/26/2007  . Chronic combined systolic and diastolic CHF (congestive heart failure) (HCC) 01/26/2007  . Hypothyroidism 01/02/2007  . Diabetes mellitus with neuropathy (HCC) 01/02/2007  . Anxiety state 01/02/2007  . Essential hypertension 01/02/2007  . ATRIAL FIBRILLATION 01/02/2007  . DIVERTICULOSIS, COLON 01/02/2007    Past Surgical History:  Procedure Laterality Date  . CARDIAC CATHETERIZATION N/A 04/20/2016   Procedure: Left Heart Cath and Coronary Angiography;  Surgeon: Yvonne Kendall, MD;  Location: [facility] INVASIVE CV LAB;  Service: Cardiovascular;  Laterality: N/A;  . CARDIAC CATHETERIZATION N/A 04/24/2016   Procedure: Coronary Stent Intervention;  Surgeon: Lennette Bihari, MD;  Location: [facility] INVASIVE CV LAB;  Service: Cardiovascular;  Laterality: N/A;  . CARDIAC CATHETERIZATION N/A 04/24/2016   Procedure: Coronary Balloon Angioplasty;  Surgeon: Lennette Bihari, MD;  Location: [facility] INVASIVE CV LAB;  Service: Cardiovascular;  Laterality: N/A;  . CARDIAC CATHETERIZATION N/A 04/26/2016   Procedure: Coronary Stent Intervention;  Surgeon: Jerolyn Center  Argentina Donovan, MD;  Location: [facility] INVASIVE CV LAB;  Service: Cardiovascular;  Laterality: N/A;  . CATARACT EXTRACTION  2002  . IR THORACENTESIS ASP PLEURAL SPACE W/IMG GUIDE  06/07/2017  . L3 compression fraction    . PACEMAKER PLACEMENT    . TRANSTHORACIC ECHOCARDIOGRAM  2011, 2012     OB History   None      Home Medications    Prior to Admission  medications   Medication Sig Start Date End Date Taking? Authorizing Provider  atorvastatin (LIPITOR) 40 MG tablet Take 1 tablet (40 mg total) by mouth daily at 6 PM. Patient taking differently: Take 40 mg by mouth every evening.  05/01/16   Arty Baumgartner, NP  bisacodyl (DULCOLAX) 10 MG suppository Place 10 mg rectally daily as needed for severe constipation.    [provider]  CARTIA XT 300 MG 24 hr capsule take 1 capsule by mouth once daily 03/09/16   Gordy Savers, MD  docusate sodium (COLACE) 100 MG capsule Take 100 mg by mouth 2 (two) times daily as needed (constipation).    [provider]  ferrous sulfate 325 (65 FE) MG tablet take 1 tablet by mouth once daily 03/31/16   Gordy Savers, MD  furosemide (LASIX) 40 MG tablet take 1 tablet by mouth once daily Patient taking differently: take 1 tablet by mouth twice daily 06/12/16   Gordy Savers, MD  hydrOXYzine (ATARAX/VISTARIL) 10 MG tablet Take 10 mg by mouth 2 (two) times daily. For anxiety/itching (8a and 4p)    [provider]  hyoscyamine (LEVBID) 0.375 MG 12 hr tablet take 1 tablet by mouth twice a day ONLY IF NEEDED  **DO NOT TAKE EVERYDAY MUST LAST 30 DAYS** Patient taking differently: take 1 tablet by mouth twice daily as needed for diverticulosis 12/20/15   Gordy Savers, MD  ketotifen (ZADITOR) 0.025 % ophthalmic solution Place 1 drop into both eyes 2 (two) times daily as needed (itching).    [provider]  levalbuterol Pauline Aus) 0.63 MG/3ML nebulizer solution Take 0.63 mg by nebulization every 6 (six) hours as needed for wheezing or shortness of breath.    [provider]  levothyroxine (SYNTHROID, LEVOTHROID) 100 MCG tablet Take 100 mcg by mouth daily before breakfast.    [provider]  LORazepam (ATIVAN) 0.5 MG tablet Take 1 tablet (0.5 mg total) by mouth 2 (two) times daily as needed for anxiety. 10/11/17   Jerald Kief, MD  Melatonin 3 MG  TABS Take 6 mg by mouth at bedtime.    [provider]  metFORMIN (GLUCOPHAGE) 500 MG tablet Take 500 mg by mouth daily with breakfast.    [provider]  morphine (MSIR) 15 MG tablet Take 1 tablet (15 mg total) by mouth every 4 (four) hours as needed for severe pain. 10/11/17   Jerald Kief, MD  Nutritional Supplements (NUTRITIONAL SUPPLEMENT PO) Take 120 mLs by mouth 3 (three) times daily. "Med Pass"    [provider]  OXYGEN Inhale 2 L into the lungs as needed (shortness of breath).    [provider]  polyethylene glycol powder (GLYCOLAX/MIRALAX) powder Take 17 g by mouth daily. Mix in liquid and drink    [provider]  potassium chloride SA (K-DUR,KLOR-CON) 20 MEQ tablet Take 20 mEq by mouth daily.    [provider]  senna-docusate (SENNA S) 8.6-50 MG tablet Take 1 tablet by mouth daily as needed (constipation).    [provider]  sertraline (ZOLOFT) 100 MG tablet Take 100 mg by mouth daily.    [provider]  warfarin (COUMADIN) 2.5 MG tablet Take 1 tablet (2.5 mg total) by mouth every evening. Please start on 10/25/17 10/25/17   Jerald Kief, MD    Family History Family History  Problem Relation Age of Onset  . Heart attack Father   . Colon cancer Neg Hx     Social History Social History   Tobacco Use  . Smoking status: Never Smoker  . Smokeless tobacco: Never Used  Substance Use Topics  . Alcohol use: No    Alcohol/week: 0.0 oz  . Drug use: No     Allergies   Codeine sulfate and Simvastatin   Review of Systems Review of Systems  Unable to perform ROS: Dementia     Physical Exam Updated Vital Signs BP (!) 129/59 (BP Location: Left Arm)   Pulse 72   Temp 98.1 F (36.7 C) (Oral)   Resp 14   SpO2 95%   Physical Exam  Nursing note and vitals reviewed.  84 year old female, resting comfortably and in no acute distress. Vital signs are normal. Oxygen saturation is 95%, which is  normal. Head is normocephalic and atraumatic.  Pupils are pinpoint, EOMI. Oropharynx is clear.  Mucous membranes are dry. Neck is nontender and supple without adenopathy. JVD is present. Back is nontender and there is no CVA tenderness. Lungs have bibasilar rales without wheezes or rhonchi. Chest is nontender. Heart has regular rate and rhythm with 3/6 holosystolic murmur best heard on the left sternal border.  There is a left parasternal heave. Abdomen is soft, flat, nontender without masses or hepatosplenomegaly and peristalsis is normoactive. Extremities have 2+ pretibial edema, and also has 1-2+ edema of the forearms.  Full range of motion is present. Skin is warm and dry without rash. Neurologic: Lethargic but arousable, oriented to person but not place or time, cranial nerves are intact, there are no motor or sensory deficits.  ED Treatments / Results  Labs (all labs ordered are listed, but only abnormal results are displayed) Labs Reviewed  COMPREHENSIVE METABOLIC PANEL - Abnormal; Notable for the following components:      Result Value   Potassium 7.1 (*)    BUN 97 (*)    Creatinine, Ser 3.23 (*)    Calcium 8.8 (*)    Total Protein 5.8 (*)    Albumin 2.6 (*)    Total Bilirubin 1.9 (*)    GFR calc non Af Amer 12 (*)    GFR calc Af Amer 14 (*)    All other components within normal limits  CBC WITH DIFFERENTIAL/PLATELET - Abnormal; Notable for the following components:   RBC 3.83 (*)    MCV 107.8 (*)    MCHC 29.8 (*)    RDW 19.5 (*)    Platelets 79 (*)    Neutro Abs 8.3 (*)    Abs Immature Granulocytes 0.2 (*)    All other components within normal limits  BRAIN NATRIURETIC PEPTIDE - Abnormal; Notable for the following components:   B Natriuretic Peptide 1,174.2 (*)    All other components within normal limits  URINALYSIS, ROUTINE W REFLEX MICROSCOPIC - Abnormal; Notable for the following components:   Color, Urine AMBER (*)    APPearance HAZY (*)    Protein, ur 100 (*)     Bacteria, UA RARE (*)    All other components within normal limits  PROTIME-INR - Abnormal; Notable  for the following components:   Prothrombin Time 36.7 (*)    All other components within normal limits  POTASSIUM - Abnormal; Notable for the following components:   Potassium 7.2 (*)    All other components within normal limits  I-STAT ARTERIAL BLOOD GAS, ED - Abnormal; Notable for the following components:   pH, Arterial 7.296 (*)    pO2, Arterial 64.0 (*)    Acid-base deficit 5.0 (*)    All other components within normal limits  CBG MONITORING, ED - Abnormal; Notable for the following components:   Glucose-Capillary 192 (*)    All other components within normal limits  CBG MONITORING, ED - Abnormal; Notable for the following components:   Glucose-Capillary 65 (*)    All other components within normal limits  NA AND K (SODIUM & POTASSIUM), RAND UR  CREATININE, URINE, RANDOM  POTASSIUM  POTASSIUM  POTASSIUM  POTASSIUM  PROTIME-INR  I-STAT TROPONIN, ED    EKG EKG Interpretation  Date/Time:  Friday 12/10/2017 00:09:37 EDT Ventricular Rate:  74 PR Interval:    QRS Duration: 205 QT Interval:  460 QTC Calculation: 511 R Axis:   -74 Text Interpretation:  VENTRICULAR PACED RHYTHM When compared with ECG of 10/08/2017, Premature ventricular complexes are no longer present Confirmed by Dione Booze ([id number]) on 12-10-2017 2:48:30 AM   Radiology Dg Chest Port 1 View  Result Date: 2017-12-10 CLINICAL DATA:  84 year old female with extremity swelling. EXAM: PORTABLE CHEST 1 VIEW COMPARISON:  Chest radiograph dated 06/07/2017 FINDINGS: Bilateral pleural effusions, right greater left and increased compared to the prior radiograph. There is hazy opacity throughout the right lung which may be related to a moderate size right pleural effusion and associated atelectatic changes of the right lung versus pneumonia. Clinical correlation is recommended. There is no pneumothorax. There is  cardiomegaly with slight interval increase in the cardiopericardial silhouette compared to the prior radiograph. The apparent increase in the cardiac size may be somewhat positional as the patient is slightly rotated. Left pectoral pacemaker device. No acute osseous pathology. IMPRESSION: Bilateral pleural effusions. Interval increase in the size of the right pleural effusion with associated atelectatic changes of the right lung versus pneumonia. Clinical correlation is recommended. Cardiomegaly. Electronically Signed   By: Elgie Collard M.D.   On: 12/10/17 01:42    Procedures Procedures  CRITICAL CARE Performed by: Dione Booze Total critical care time: 60 minutes Critical care time was exclusive of separately billable procedures and treating other patients. Critical care was necessary to treat or prevent imminent or life-threatening deterioration. Critical care was time spent personally by me on the following activities: development of treatment plan with patient and/or surrogate as well as nursing, discussions with consultants, evaluation of patient's response to treatment, examination of patient, obtaining history from patient or surrogate, ordering and performing treatments and interventions, ordering and review of laboratory studies, ordering and review of radiographic studies, pulse oximetry and re-evaluation of patient's condition.  Medications Ordered in ED Medications  insulin aspart (novoLOG) injection 0-9 Units (0 Units Subcutaneous Not Given 12-10-2017 6045)  levothyroxine (SYNTHROID, LEVOTHROID) injection 50 mcg (has no administration in time range)  acetaminophen (TYLENOL) tablet 650 mg (has no administration in time range)    Or  acetaminophen (TYLENOL) suppository 650 mg (has no administration in time range)  ondansetron (ZOFRAN) tablet 4 mg (has no administration in time range)    Or  ondansetron (ZOFRAN) injection 4 mg (has no administration in time range)  fentaNYL  (SUBLIMAZE) injection 12.5  mcg (has no administration in time range)  dextrose 50 % solution 50 mL (50 mLs Intravenous Given 14-Jul-2017 0336)  insulin aspart (novoLOG) injection 10 Units (10 Units Intravenous Given 14-Jul-2017 0336)  albuterol (PROVENTIL) (2.5 MG/3ML) 0.083% nebulizer solution 2.5 mg (2.5 mg Nebulization Given 14-Jul-2017 0316)  calcium gluconate 1 g in sodium chloride 0.9 % 100 mL IVPB (0 g Intravenous Stopped 14-Jul-2017 0446)  sodium bicarbonate injection 50 mEq (50 mEq Intravenous Given 14-Jul-2017 0342)  sodium chloride 0.9 % bolus 500 mL (0 mLs Intravenous Stopped 14-Jul-2017 0339)  sodium polystyrene (KAYEXALATE) 15 GM/60ML suspension 45 g (45 g Rectal Given 14-Jul-2017 0620)  naloxone [facility](NARCAN) injection 0.4 mg (0.4 mg Intravenous Given 14-Jul-2017 0331)  furosemide (LASIX) injection 80 mg (80 mg Intravenous Given 14-Jul-2017 0402)  phytonadione (VITAMIN K) 5 mg in dextrose 5 % 50 mL IVPB (0 mg Intravenous Stopped 14-Jul-2017 0650)  dextrose 50 % solution (25 mLs  Given 14-Jul-2017 0602)     Initial Impression / Assessment and Plan / ED Course  I have reviewed the triage vital signs and the nursing notes.  Pertinent labs & imaging results that were available during my care of the patient were reviewed by me and considered in my medical decision making (see chart for details).  Peripheral edema which is likely related to heart failure.  Patient also has signs of heart failure with neck vein distention and bibasilar rales.  Decreased mental status which is likely related to narcotic use, but will check urinalysis to make sure there is not underlying occult UTI.  Will check chest x-ray and screening labs.  Regarding DNR status, patient is advised to redo her paperwork with the nursing facility, but advised that we would follow her wishes and do CPR if she had cardiopulmonary arrest.  Old records reviewed showing hospitalization for GI bleed 2 months ago.  Of note, she was full code during that hospitalization.  3:14  AM Potassium is come back markedly elevated at 7.1.  ECG shows paced rhythm and cannot reliably show signs of hyperkalemia.  Creatinine is elevated to 3.23, was 1.21 on May 23.  Hyperkalemia apparently due to a combination of spironolactone treatment and renal failure.  She is given aggressive treatment for hyperkalemia.  Chest x-ray is consistent with CHF exacerbation with fluid overload.  Respiratory status and level of alertness have significantly decreased.  She is showing agonal respirations although maintaining adequate oxygen saturation.  Arrangements are made for hospital admission.  Daughter who is here is advised that she is showing signs of significant deterioration and is at risk for needing intubation tonight and also at risk for dying tonight.  Given that she has been on narcotics, she is given a's small dose of naloxone.  With this, she had improvement in her respiratory status and she became much more alert, but also was crying out in pain.  Her care going forward will be very difficult trying to navigate between adequate pain control and not oversedation with opioids as well as navigating her hyperkalemia, renal failure, borderline blood pressure and heart failure.  Case has been discussed with both Dr. Louanne Skyeettinger of critical care and with Dr. Julian ReilGardner of [facility].  Arrangements are made for hospital admission.  Final Clinical Impressions(s) / ED Diagnoses   Final diagnoses:  Acute kidney injury (nontraumatic) (HCC)  Hyperkalemia  Opioid overdose, accidental or unintentional, sequela  Acute on chronic congestive heart failure, unspecified heart failure type [facility](HCC)    ED Discharge Orders  None       Dione Booze, MD 12-05-17 [phone]

## 2017-12-20 NOTE — ED Notes (Signed)
Critical Care at bedside.  

## 2017-12-20 NOTE — ED Notes (Signed)
Dentures given to family.

## 2017-12-20 NOTE — ED Notes (Signed)
MD paged due to patient not tolerating Bipap, extremely restless and moaning in pain.

## 2017-12-20 NOTE — ED Notes (Addendum)
Dr. Roda ShuttersXu at bedside speaking with family

## 2017-12-20 NOTE — Progress Notes (Signed)
RT took pt off Bipap for approximately 10 minutes due to pt anxiety.  RT swabbed pt's mouth and readjusted mask.  Pt placed back on Bipap.  Family at bedside.

## 2017-12-20 NOTE — ED Notes (Signed)
EDP notified of potassium result.  

## 2017-12-20 NOTE — ED Notes (Signed)
MD Roda ShuttersXu. Notified about Critical K and INR lab values

## 2017-12-20 NOTE — ED Notes (Signed)
Palliative Care at bedside.  

## 2017-12-20 NOTE — ED Notes (Signed)
CDS notified. Spoke with Cammy BrochureLatoshia Moore, Reference # [phone] [id number]

## 2017-12-20 NOTE — Progress Notes (Signed)
Palliative Medicine RN Note:  Arrived in ED to support for symptom management. Pt is able to answer yes or no questions and give some brief answers, but speech is otherwise unintelligible. Patient does indicate that her "rump" is hurting; RN gave IV morphine immediately prior to my arrival.   Discussed plan with present family: 2 granddaughters, 2 daughters. We discussed Dr Lamar BlinksGolding's order to change to hydromorphone based on pt's renal function. Daughter asked about ability to get palliative care at [name]. I explained that she likely needs a higher level of care than they can provide there; Dr Roda ShuttersXu plans to watch overnight and evaluate for hospice transfer if stable tomorrow. I expressed concern about her rising potassium and how it could affect the heart and body at any time. Family is sad but verbalizes understanding.  I left the room to put in orders and update Dr Roda ShuttersXu. Before I could leave the unit, I was called to the bedside; pt declining rapidly. She had no respirations or heatbeat upon my arrival, pupils fixed and dilate. TOD 1158, verified with RN Glory RosebushJulie Gibson.  Family is tearful, but granddaughter Jeanice LimHolly is more demonstrative than the other family members. Provided support. Family attempted to calm [place] unsuccessfully. Chaplain Marseilline will come visit with them to be a support.  Margret ChanceMelanie G. [place], RN, BSN, [facility] Palliative Medicine Team 30-Jul-2017 1:22 PM Office [phone]

## 2017-12-20 NOTE — Consult Note (Signed)
PULMONARY / CRITICAL CARE MEDICINE  Name: [name] Kugelman MRN: [id number] DOB: September 18, 1926    ADMISSION DATE:  12-14-17 CONSULTATION DATE:  03/24/2018  REFERRING MD :  Preston FleetingGlick  CHIEF COMPLAINT:  AMS   HISTORY OF PRESENT ILLNESS:  [name] Retzloff is a 84 y.o. female with a PMH as outlined below and who [facility].  [facility] with AMS and increased peripheral edema.  Edema worsened over the prior 3 days and on day of presentation, extended from b/l LE's to b/l UE's also.  Prior to ED arrival, she received narcotics for back pain and was later noted to have decreased level of consciousness.  In ED, she was initially hypersomnolent.  She later received a dose of 0.4mg  narcan and had some improvement in mental status.  Additionally, she had hyperkalemia (K 7.1) and AKI (SCr 3.23).  She received temporizing measures for hyperkalemia in ED.  BNP also elevated at 1, 174. CXR demonstrated b/l pleural effusions with atelectasis R > L.  PCCM was consulted due to pt's AMS and concern that she may deteriorate and lose ability to protect airway.  There was some confusion regarding code status as pt previously had DNR in our system but grand daughter stated that this was in error and pt was to be full code.  I had extensive discussions with grand daughter and son reviewing pts history including poor baseline status and current circumstances / organ failures, decision was made to list pt as DNR status.  We also discussed patient's prior wishes under circumstances such as this. The family has decided not to perform resuscitation if arrest were to occur, but to otherwise continue with current medical support / therapies.   PAST MEDICAL HISTORY :   has a past medical history of Anxiety, Atrial fibrillation (HCC), CHF (congestive heart failure) (HCC), Constipation, Diverticulosis of colon, DM (diabetes mellitus) (HCC), Hyperlipidemia, Hypertension, Hypothyroidism,  Osteoporosis, Tremor, essential, and Type II or unspecified type diabetes mellitus with neurological manifestations, not stated as uncontrolled(250.60).  has a past surgical history that includes Cataract extraction (2002); pacemaker placement; L3 compression fraction; transthoracic echocardiogram (2011, 2012); Cardiac catheterization (N/A, 04/20/2016); Cardiac catheterization (N/A, 04/24/2016); Cardiac catheterization (N/A, 04/24/2016); Cardiac catheterization (N/A, 04/26/2016); and IR THORACENTESIS ASP PLEURAL SPACE W/IMG GUIDE (06/07/2017). Prior to Admission medications   Medication Sig Start Date End Date Taking? Authorizing Provider  atorvastatin (LIPITOR) 40 MG tablet Take 1 tablet (40 mg total) by mouth daily at 6 PM. Patient taking differently: Take 40 mg by mouth every evening.  05/01/16  Yes Arty Baumgartneroberts, Lindsay B, NP  bisacodyl (DULCOLAX) 10 MG suppository Place 10 mg rectally daily as needed for severe constipation.   Yes [provider]  CARTIA XT 300 MG 24 hr capsule take 1 capsule by mouth once daily 03/09/16  Yes Gordy SaversKwiatkowski, Peter F, MD  docusate sodium (COLACE) 100 MG capsule Take 100 mg by mouth 2 (two) times daily as needed (constipation).   Yes [provider]  ferrous sulfate 325 (65 FE) MG tablet take 1 tablet by mouth once daily 03/31/16  Yes Gordy SaversKwiatkowski, Peter F, MD  furosemide (LASIX) 40 MG tablet take 1 tablet by mouth once daily Patient taking differently: take 1 tablet by mouth twice daily 06/12/16  Yes Gordy SaversKwiatkowski, Peter F, MD  hydrOXYzine (ATARAX/VISTARIL) 10 MG tablet Take 10 mg by mouth 2 (two) times daily. For anxiety/itching (8a and 4p)   Yes [provider]  ketotifen (ZADITOR) 0.025 % ophthalmic  solution Place 1 drop into both eyes 2 (two) times daily as needed (itching).   Yes [provider]  levalbuterol (XOPENEX) 0.63 MG/3ML nebulizer solution Take 0.63 mg by nebulization every 6 (six) hours as needed for wheezing or shortness of  breath.   Yes [provider]  levothyroxine (SYNTHROID, LEVOTHROID) 100 MCG tablet Take 100 mcg by mouth daily before breakfast.   Yes [provider]  LORazepam (ATIVAN) 0.5 MG tablet Take 1 tablet (0.5 mg total) by mouth 2 (two) times daily as needed for anxiety. 10/11/17  Yes Jerald Kief, MD  Melatonin 3 MG TABS Take 6 mg by mouth at bedtime.   Yes [provider]  metFORMIN (GLUCOPHAGE) 500 MG tablet Take 500 mg by mouth daily with breakfast.   Yes [provider]  mirtazapine (REMERON) 7.5 MG tablet Take 7.5 mg by mouth at bedtime.   Yes [provider]  morphine (MSIR) 15 MG tablet Take 1 tablet (15 mg total) by mouth every 4 (four) hours as needed for severe pain. 10/11/17  Yes Jerald Kief, MD  neomycin-bacitracin-polymyxin (NEOSPORIN) ointment Apply 1 application topically daily.   Yes [provider]  Nutritional Supplements (NUTRITIONAL SUPPLEMENT PO) Take 120 mLs by mouth 3 (three) times daily. "Med Pass"   Yes [provider]  polyethylene glycol powder (GLYCOLAX/MIRALAX) powder Take 17 g by mouth daily. Mix in liquid and drink   Yes [provider]  potassium chloride SA (K-DUR,KLOR-CON) 20 MEQ tablet Take 20 mEq by mouth daily.   Yes [provider]  senna-docusate (SENNA S) 8.6-50 MG tablet Take 1 tablet by mouth daily as needed (constipation).   Yes [provider]  sertraline (ZOLOFT) 100 MG tablet Take 100 mg by mouth daily.   Yes [provider]  traMADol (ULTRAM) 50 MG tablet Take 50 mg by mouth every 6 (six) hours as needed for moderate pain.   Yes [provider]  warfarin (COUMADIN) 2.5 MG tablet Take 1 tablet (2.5 mg total) by mouth every evening. Please start on 10/25/17 10/25/17  Yes Jerald Kief, MD  hyoscyamine (LEVBID) 0.375 MG 12 hr tablet take 1 tablet by mouth twice a day ONLY IF NEEDED  **DO NOT TAKE EVERYDAY MUST LAST 30 DAYS** Patient not taking:  Reported on 12-03-17 12/20/15   Gordy Savers, MD  OXYGEN Inhale 2 L into the lungs as needed (shortness of breath).    [provider]   Allergies  Allergen Reactions  . Codeine Sulfate Shortness Of Breath and Other (See Comments)    Felt funny  . Simvastatin Other (See Comments)     choking, acid reflux    FAMILY HISTORY:  family history includes Heart attack in her father. SOCIAL HISTORY:  reports that she has never smoked. She has never used smokeless tobacco. She reports that she does not drink alcohol or use drugs.  REVIEW OF SYSTEMS:   Unable to obtain as pt is encephalopathic.   SUBJECTIVE:  On BiPAP for increased work of breathing.  VITAL SIGNS: Temp:  [98.1 Holmes (36.7 C)] 98.1 Holmes (36.7 C) (07/12 0014) Pulse Rate:  [69-72] 69 (07/12 0300) Resp:  [9-14] 9 (07/12 0230) BP: (104-129)/(49-59) 109/57 (07/12 0230) SpO2:  [93 %-96 %] 95 % (07/12 0300)  PHYSICAL EXAMINATION: General: Frail elderly female, resting in bed, in NAD. Neuro: Awake, does not follow commands.  MAE's. HEENT: [place]/AT. Sclerae anicteric. Cardiovascular: IRIR, no M/R/G.  Lungs: Respirations even and unlabored.  Coarse  bilaterally. Abdomen: BS x 4, soft, NT/ND.  Musculoskeletal: No gross deformities, 2+ edema b/l UE's, 1+ LE's. Skin: Intact, warm, no rashes.   Recent Labs  Lab 2017-12-05 0110  NA 140  K 7.1*  CL 104  CO2 22  BUN 97*  CREATININE 3.23*  GLUCOSE 77   Recent Labs  Lab 2017-12-05 0110  HGB 12.3  HCT 41.3  WBC 9.9  PLT 79*   Dg Chest Port 1 View  Result Date: 12-05-2017 CLINICAL DATA:  84 year old female with extremity swelling. EXAM: PORTABLE CHEST 1 VIEW COMPARISON:  Chest radiograph dated 06/07/2017 FINDINGS: Bilateral pleural effusions, right greater left and increased compared to the prior radiograph. There is hazy opacity throughout the right lung which may be related to a moderate size right pleural effusion and associated atelectatic changes of the right  lung versus pneumonia. Clinical correlation is recommended. There is no pneumothorax. There is cardiomegaly with slight interval increase in the cardiopericardial silhouette compared to the prior radiograph. The apparent increase in the cardiac size may be somewhat positional as the patient is slightly rotated. Left pectoral pacemaker device. No acute osseous pathology. IMPRESSION: Bilateral pleural effusions. Interval increase in the size of the right pleural effusion with associated atelectatic changes of the right lung versus pneumonia. Clinical correlation is recommended. Cardiomegaly. Electronically Signed   By: Elgie Collard M.D.   On: 2017/12/05 01:42    STUDIES:  CXR 7/12 > b/l effusions with atelectasis R > L. Renal US 7/12 >  Echo 7/12 >   SIGNIFICANT EVENTS  7/12 > admit.  ASSESSMENT / PLAN:  AMS - improved after one dose 0.4mg  narcan. Plan: Limit sedating meds. Continue to monitor. No need for ICU admission at this time.  Acute on chronic hypoxic respiratory failure - on 2L baseline O2. B/l pleural effusions - likely due to CHF. Atelectasis. Plan: Continue supplemental O2 as needed to maintain SpO2 > 90%. BiPAP PRN for increased work of breathing. Day team to assess chest under US guidance and consider diagnostic / therapeutic thoracentesis. Incentive spirometry. Follow CXR.  AKI - ? Cardiorenal. Hyperkalemia - s/p temporizing measures.  Repeat pending. Plan: Continue diuresis. Follow BMP. Holmes/u renal US. Might need short term HD if K does not self correct.  Coagulopathy - INR 3.73 (on coumadin for A.fib). Plan: Hold coumadin for now. 1 dose vitamin K now in case of thora and/or HD cath in AM 7/12.  AoC sCHF - echo from Dec 2017 with EF 40-45%. A.fib. Plan: Continue diuresis. Repeat echo. Hold coumadin as above.  Hx Dementia. Failure to thrive. Plan: There was some confusion regarding code status as pt previously had DNR in our system but grand  daughter stated that this was in error and pt was to be full code.  I had extensive discussions with grand daughter and son reviewing pts history including poor baseline status and current circumstances / organ failures, decision was made to list pt as DNR status.  We also discussed patient's prior wishes under circumstances such as this. The family has decided not to perform resuscitation if arrest were to occur, but to otherwise continue with current medical support / therapies.  Rest per primary team.   Rutherford Guys, PA - [facility] [place] [facility] Pager: [phone]  or [phone] 2017-12-05, 5:06 AM

## 2017-12-20 NOTE — ED Notes (Signed)
Julian ReilGardner, MD messaged about decreased alertness/agitation. Will continue to monitor.

## 2017-12-20 NOTE — Progress Notes (Signed)
Pt not tolerating the bipap at this time.  RT placed pt on  6 L, sats 93%.  Pt is more calm, no increased WOB noted.  RN and family at bedside.

## 2017-12-20 DEATH — deceased
